# Patient Record
Sex: Female | Born: 1935 | Race: Black or African American | Hispanic: No | Smoking: Former smoker
Health system: Southern US, Community
[De-identification: ages and names within clinical notes are randomized; demographics above are authoritative.]

## PROBLEM LIST (undated history)

## (undated) DIAGNOSIS — C8590 Non-Hodgkin lymphoma, unspecified, unspecified site: Secondary | ICD-10-CM

## (undated) DIAGNOSIS — D696 Thrombocytopenia, unspecified: Secondary | ICD-10-CM

## (undated) DIAGNOSIS — M1990 Unspecified osteoarthritis, unspecified site: Secondary | ICD-10-CM

## (undated) DIAGNOSIS — I639 Cerebral infarction, unspecified: Secondary | ICD-10-CM

## (undated) DIAGNOSIS — C9192 Lymphoid leukemia, unspecified, in relapse: Secondary | ICD-10-CM

## (undated) DIAGNOSIS — F32A Depression, unspecified: Secondary | ICD-10-CM

## (undated) DIAGNOSIS — C50919 Malignant neoplasm of unspecified site of unspecified female breast: Secondary | ICD-10-CM

## (undated) DIAGNOSIS — C9112 Chronic lymphocytic leukemia of B-cell type in relapse: Secondary | ICD-10-CM

## (undated) DIAGNOSIS — I10 Essential (primary) hypertension: Secondary | ICD-10-CM

## (undated) DIAGNOSIS — D539 Nutritional anemia, unspecified: Secondary | ICD-10-CM

## (undated) DIAGNOSIS — K219 Gastro-esophageal reflux disease without esophagitis: Secondary | ICD-10-CM

## (undated) DIAGNOSIS — F329 Major depressive disorder, single episode, unspecified: Secondary | ICD-10-CM

## (undated) DIAGNOSIS — R42 Dizziness and giddiness: Secondary | ICD-10-CM

## (undated) DIAGNOSIS — Z9289 Personal history of other medical treatment: Secondary | ICD-10-CM

## (undated) DIAGNOSIS — F419 Anxiety disorder, unspecified: Secondary | ICD-10-CM

## (undated) DIAGNOSIS — E785 Hyperlipidemia, unspecified: Secondary | ICD-10-CM

## (undated) HISTORY — PX: TOTAL ABDOMINAL HYSTERECTOMY: SHX209

## (undated) HISTORY — DX: Nutritional anemia, unspecified: D53.9

## (undated) HISTORY — PX: MASTECTOMY: SHX3

## (undated) HISTORY — PX: PAROTIDECTOMY: SHX2163

## (undated) HISTORY — DX: Thrombocytopenia, unspecified: D69.6

## (undated) HISTORY — DX: Non-Hodgkin lymphoma, unspecified, unspecified site: C85.90

## (undated) HISTORY — DX: Hyperlipidemia, unspecified: E78.5

## (undated) HISTORY — PX: DILATION AND CURETTAGE OF UTERUS: SHX78

## (undated) HISTORY — DX: Lymphoid leukemia, unspecified, in relapse: C91.92

## (undated) HISTORY — DX: Dizziness and giddiness: R42

## (undated) HISTORY — DX: Unspecified osteoarthritis, unspecified site: M19.90

## (undated) HISTORY — DX: Cerebral infarction, unspecified: I63.9

## (undated) HISTORY — DX: Chronic lymphocytic leukemia of B-cell type in relapse: C91.12

## (undated) HISTORY — DX: Essential (primary) hypertension: I10

## (undated) HISTORY — DX: Malignant neoplasm of unspecified site of unspecified female breast: C50.919

## (undated) HISTORY — PX: HEMORRHOID SURGERY: SHX153

## (undated) HISTORY — PX: PORTACATH PLACEMENT: SHX2246

---

## 1988-09-29 HISTORY — PX: CAROTID ENDARTERECTOMY: SUR193

## 2005-08-17 ENCOUNTER — Emergency Department (HOSPITAL_COMMUNITY): Admission: EM | Admit: 2005-08-17 | Discharge: 2005-08-17 | Payer: Self-pay | Admitting: Emergency Medicine

## 2007-09-30 DIAGNOSIS — C50919 Malignant neoplasm of unspecified site of unspecified female breast: Secondary | ICD-10-CM

## 2007-09-30 HISTORY — PX: AXILLARY LYMPH NODE BIOPSY: SHX5737

## 2007-09-30 HISTORY — PX: BREAST BIOPSY: SHX20

## 2007-09-30 HISTORY — DX: Malignant neoplasm of unspecified site of unspecified female breast: C50.919

## 2008-07-24 ENCOUNTER — Ambulatory Visit (HOSPITAL_COMMUNITY): Admission: RE | Admit: 2008-07-24 | Discharge: 2008-07-24 | Payer: Self-pay | Admitting: General Surgery

## 2008-07-26 ENCOUNTER — Ambulatory Visit: Payer: Self-pay | Admitting: Oncology

## 2008-07-30 HISTORY — PX: BONE MARROW BIOPSY: SHX199

## 2008-08-09 ENCOUNTER — Ambulatory Visit: Payer: Self-pay | Admitting: Oncology

## 2008-08-09 ENCOUNTER — Inpatient Hospital Stay (HOSPITAL_COMMUNITY): Admission: AD | Admit: 2008-08-09 | Discharge: 2008-08-23 | Payer: Self-pay | Admitting: Oncology

## 2008-08-09 LAB — CBC & DIFF AND RETIC
Basophils Absolute: 0 10*3/uL (ref 0.0–0.1)
Eosinophils Absolute: 0.1 10*3/uL (ref 0.0–0.5)
HCT: 24.7 % — ABNORMAL LOW (ref 34.8–46.6)
HGB: 9 g/dL — ABNORMAL LOW (ref 11.6–15.9)
IRF: 0.38 — ABNORMAL HIGH (ref 0.130–0.330)
LYMPH%: 55.2 % — ABNORMAL HIGH (ref 14.0–48.0)
MCHC: 36.2 g/dL — ABNORMAL HIGH (ref 32.0–36.0)
MONO#: 0.2 10*3/uL (ref 0.1–0.9)
NEUT%: 39.1 % — ABNORMAL LOW (ref 39.6–76.8)
Platelets: 7 10*3/uL — CL (ref 145–400)
WBC: 4.4 10*3/uL (ref 3.9–10.0)
lymph#: 2.4 10*3/uL (ref 0.9–3.3)

## 2008-08-09 LAB — MORPHOLOGY: PLT EST: DECREASED

## 2008-08-10 ENCOUNTER — Encounter: Payer: Self-pay | Admitting: Oncology

## 2008-08-11 LAB — IMMUNOFIXATION ELECTROPHORESIS
IgA: 161 mg/dL (ref 68–378)
IgM, Serum: 188 mg/dL (ref 60–263)

## 2008-08-11 LAB — URIC ACID: Uric Acid, Serum: 6.5 mg/dL (ref 2.4–7.0)

## 2008-08-12 ENCOUNTER — Ambulatory Visit: Payer: Self-pay | Admitting: Hematology & Oncology

## 2008-08-28 ENCOUNTER — Encounter (HOSPITAL_COMMUNITY): Admission: RE | Admit: 2008-08-28 | Discharge: 2008-09-28 | Payer: Self-pay | Admitting: Oncology

## 2008-08-28 LAB — CBC WITH DIFFERENTIAL/PLATELET
BASO%: 1.5 % (ref 0.0–2.0)
LYMPH%: 60.2 % — ABNORMAL HIGH (ref 14.0–48.0)
MCHC: 36.2 g/dL — ABNORMAL HIGH (ref 32.0–36.0)
MONO#: 0.1 10*3/uL (ref 0.1–0.9)
Platelets: <6 10*3/uL — CL (ref 145–400)
RBC: 2.67 10*6/uL — ABNORMAL LOW (ref 3.70–5.32)
RDW: 14 % (ref 11.3–14.5)
WBC: 1.4 10*3/uL — ABNORMAL LOW (ref 3.9–10.0)
lymph#: 0.8 10*3/uL — ABNORMAL LOW (ref 0.9–3.3)

## 2008-08-30 LAB — CBC WITH DIFFERENTIAL/PLATELET
BASO%: 2 % (ref 0.0–2.0)
Basophils Absolute: 0 10*3/uL (ref 0.0–0.1)
Eosinophils Absolute: 0 10*3/uL (ref 0.0–0.5)
HCT: 21.6 % — ABNORMAL LOW (ref 34.8–46.6)
HGB: 7.6 g/dL — ABNORMAL LOW (ref 11.6–15.9)
LYMPH%: 43.6 % (ref 14.0–48.0)
MONO#: 0 10*3/uL — ABNORMAL LOW (ref 0.1–0.9)
NEUT%: 47.9 % (ref 39.6–76.8)
Platelets: 21 10*3/uL — ABNORMAL LOW (ref 145–400)
WBC: 0.7 10*3/uL — CL (ref 3.9–10.0)
lymph#: 0.3 10*3/uL — ABNORMAL LOW (ref 0.9–3.3)

## 2008-09-01 LAB — CBC WITH DIFFERENTIAL/PLATELET
Basophils Absolute: 0 10*3/uL (ref 0.0–0.1)
Eosinophils Absolute: 0 10*3/uL (ref 0.0–0.5)
HCT: 26.3 % — ABNORMAL LOW (ref 34.8–46.6)
HGB: 9.5 g/dL — ABNORMAL LOW (ref 11.6–15.9)
MCH: 32.2 pg (ref 26.0–34.0)
MCV: 89.5 fL (ref 81.0–101.0)
MONO%: 1.5 % (ref 0.0–13.0)
NEUT#: 0.2 10*3/uL — CL (ref 1.5–6.5)
NEUT%: 30.4 % — ABNORMAL LOW (ref 39.6–76.8)
Platelets: 9 10*3/uL — CL (ref 145–400)
RDW: 16.4 % — ABNORMAL HIGH (ref 11.3–14.5)

## 2008-09-04 LAB — CBC WITH DIFFERENTIAL/PLATELET
HGB: 8.9 g/dL — ABNORMAL LOW (ref 11.6–15.9)
MCH: 30.3 pg (ref 26.0–34.0)
MCHC: 36 g/dL (ref 32.0–36.0)
MCV: 84 fL (ref 81.0–101.0)
RDW: 13.5 % (ref 11.3–14.5)

## 2008-09-04 LAB — MANUAL DIFFERENTIAL
Band Neutrophils: 5 % (ref 0–10)
Basophil: 0 % (ref 0–2)
EOS: 0 % (ref 0–7)
LYMPH: 85 % — ABNORMAL HIGH (ref 14–49)
Metamyelocytes: 0 % (ref 0–0)
PLT EST: DECREASED
PROMYELO: 0 % (ref 0–0)
nRBC: 0 % (ref 0–0)

## 2008-09-06 LAB — CBC WITH DIFFERENTIAL/PLATELET
Basophils Absolute: 0 10*3/uL (ref 0.0–0.1)
Eosinophils Absolute: 0 10*3/uL (ref 0.0–0.5)
HCT: 23.2 % — ABNORMAL LOW (ref 34.8–46.6)
HGB: 8.4 g/dL — ABNORMAL LOW (ref 11.6–15.9)
LYMPH%: 66.1 % — ABNORMAL HIGH (ref 14.0–48.0)
MCV: 84.7 fL (ref 81.0–101.0)
MONO#: 0.1 10*3/uL (ref 0.1–0.9)
MONO%: 6.6 % (ref 0.0–13.0)
NEUT#: 0.2 10*3/uL — CL (ref 1.5–6.5)
NEUT%: 22.4 % — ABNORMAL LOW (ref 39.6–76.8)
Platelets: 11 10*3/uL — ABNORMAL LOW (ref 145–400)
WBC: 1.1 10*3/uL — ABNORMAL LOW (ref 3.9–10.0)

## 2008-09-08 LAB — CBC WITH DIFFERENTIAL/PLATELET
BASO%: 0.6 % (ref 0.0–2.0)
HCT: 19.3 % — ABNORMAL LOW (ref 34.8–46.6)
LYMPH%: 93.5 % — ABNORMAL HIGH (ref 14.0–48.0)
MCH: 31.8 pg (ref 26.0–34.0)
MCHC: 36.5 g/dL — ABNORMAL HIGH (ref 32.0–36.0)
MCV: 87.1 fL (ref 81.0–101.0)
MONO#: 0 10*3/uL — ABNORMAL LOW (ref 0.1–0.9)
MONO%: 0.9 % (ref 0.0–13.0)
NEUT%: 4.3 % — ABNORMAL LOW (ref 39.6–76.8)
Platelets: 9 10*3/uL — CL (ref 145–400)
RBC: 2.22 10*6/uL — ABNORMAL LOW (ref 3.70–5.32)
WBC: 1.8 10*3/uL — ABNORMAL LOW (ref 3.9–10.0)

## 2008-09-08 LAB — COMPREHENSIVE METABOLIC PANEL
ALT: 20 U/L (ref 0–35)
CO2: 24 mEq/L (ref 19–32)
Calcium: 9.2 mg/dL (ref 8.4–10.5)
Chloride: 107 mEq/L (ref 96–112)
Creatinine, Ser: 1.14 mg/dL (ref 0.40–1.20)
Glucose, Bld: 109 mg/dL — ABNORMAL HIGH (ref 70–99)
Total Protein: 6 g/dL (ref 6.0–8.3)

## 2008-09-08 LAB — TYPE & CROSSMATCH - CHCC

## 2008-09-11 ENCOUNTER — Ambulatory Visit: Payer: Self-pay | Admitting: Oncology

## 2008-09-11 LAB — CBC WITH DIFFERENTIAL/PLATELET
Basophils Absolute: 0 10*3/uL (ref 0.0–0.1)
EOS%: 3.6 % (ref 0.0–7.0)
HGB: 7.2 g/dL — ABNORMAL LOW (ref 11.6–15.9)
LYMPH%: 91.4 % — ABNORMAL HIGH (ref 14.0–48.0)
MCH: 29.9 pg (ref 26.0–34.0)
MCV: 84.3 fL (ref 81.0–101.0)
MONO%: 0.8 % (ref 0.0–13.0)
NEUT%: 3.2 % — ABNORMAL LOW (ref 39.6–76.8)
RDW: 12.9 % (ref 11.3–14.5)

## 2008-09-13 LAB — CBC WITH DIFFERENTIAL/PLATELET
BASO%: 1.1 % (ref 0.0–2.0)
LYMPH%: 89 % — ABNORMAL HIGH (ref 14.0–48.0)
MCH: 28.6 pg (ref 26.0–34.0)
MCHC: 35.3 g/dL (ref 32.0–36.0)
MCV: 81 fL (ref 81.0–101.0)
MONO%: 2 % (ref 0.0–13.0)
Platelets: 8 10*3/uL — CL (ref 145–400)
RBC: 3.11 10*6/uL — ABNORMAL LOW (ref 3.70–5.32)

## 2008-09-18 LAB — CBC WITH DIFFERENTIAL/PLATELET
BASO%: 1.2 % (ref 0.0–2.0)
EOS%: 0.4 % (ref 0.0–7.0)
LYMPH%: 92.8 % — ABNORMAL HIGH (ref 14.0–48.0)
MCH: 29.5 pg (ref 26.0–34.0)
MCHC: 37.1 g/dL — ABNORMAL HIGH (ref 32.0–36.0)
MONO#: 0.1 10*3/uL (ref 0.1–0.9)
RBC: 2.78 10*6/uL — ABNORMAL LOW (ref 3.70–5.32)
WBC: 2 10*3/uL — ABNORMAL LOW (ref 3.9–10.0)
lymph#: 1.8 10*3/uL (ref 0.9–3.3)

## 2008-09-19 LAB — TYPE & CROSSMATCH - CHCC

## 2008-09-25 LAB — CBC WITH DIFFERENTIAL/PLATELET
Basophils Absolute: 0 10*3/uL (ref 0.0–0.1)
Eosinophils Absolute: 0 10*3/uL (ref 0.0–0.5)
HGB: 8.2 g/dL — ABNORMAL LOW (ref 11.6–15.9)
NEUT#: 0.1 10*3/uL — CL (ref 1.5–6.5)
RBC: 2.73 10*6/uL — ABNORMAL LOW (ref 3.70–5.32)
RDW: 15.3 % — ABNORMAL HIGH (ref 11.3–14.5)
WBC: 2.3 10*3/uL — ABNORMAL LOW (ref 3.9–10.0)
lymph#: 2.2 10*3/uL (ref 0.9–3.3)

## 2008-09-27 LAB — CBC WITH DIFFERENTIAL/PLATELET
Basophils Absolute: 0 10*3/uL (ref 0.0–0.1)
Eosinophils Absolute: 0 10*3/uL (ref 0.0–0.5)
HGB: 7.4 g/dL — ABNORMAL LOW (ref 11.6–15.9)
LYMPH%: 89.1 % — ABNORMAL HIGH (ref 14.0–48.0)
MCV: 81.2 fL (ref 81.0–101.0)
MONO#: 0 10*3/uL — ABNORMAL LOW (ref 0.1–0.9)
MONO%: 1 % (ref 0.0–13.0)
NEUT#: 0.2 10*3/uL — CL (ref 1.5–6.5)
Platelets: 13 10*3/uL — ABNORMAL LOW (ref 145–400)
RBC: 2.56 10*6/uL — ABNORMAL LOW (ref 3.70–5.32)
RDW: 13.2 % (ref 11.3–14.5)
WBC: 1.9 10*3/uL — ABNORMAL LOW (ref 3.9–10.0)

## 2008-09-28 LAB — TYPE & CROSSMATCH - CHCC

## 2008-10-02 LAB — CBC WITH DIFFERENTIAL/PLATELET
Eosinophils Absolute: 0 10*3/uL (ref 0.0–0.5)
MCV: 84.5 fL (ref 81.0–101.0)
MONO#: 0 10*3/uL — ABNORMAL LOW (ref 0.1–0.9)
MONO%: 0.8 % (ref 0.0–13.0)
NEUT#: 0.3 10*3/uL — CL (ref 1.5–6.5)
RBC: 2.89 10*6/uL — ABNORMAL LOW (ref 3.70–5.32)
RDW: 14.4 % (ref 11.3–14.5)
WBC: 2.4 10*3/uL — ABNORMAL LOW (ref 3.9–10.0)

## 2008-10-04 ENCOUNTER — Encounter (HOSPITAL_COMMUNITY): Admission: RE | Admit: 2008-10-04 | Discharge: 2009-01-02 | Payer: Self-pay | Admitting: Oncology

## 2008-10-04 LAB — CBC WITH DIFFERENTIAL/PLATELET
Eosinophils Absolute: 0 10*3/uL (ref 0.0–0.5)
LYMPH%: 85.4 % — ABNORMAL HIGH (ref 14.0–48.0)
MONO#: 0 10*3/uL — ABNORMAL LOW (ref 0.1–0.9)
NEUT#: 0.3 10*3/uL — CL (ref 1.5–6.5)
Platelets: 7 10*3/uL — CL (ref 145–400)
RBC: 2.66 10*6/uL — ABNORMAL LOW (ref 3.70–5.32)
RDW: 14.2 % (ref 11.3–14.5)
WBC: 2.2 10*3/uL — ABNORMAL LOW (ref 3.9–10.0)
lymph#: 1.8 10*3/uL (ref 0.9–3.3)

## 2008-10-05 LAB — TYPE & CROSSMATCH - CHCC

## 2008-10-09 LAB — CBC WITH DIFFERENTIAL/PLATELET
Basophils Absolute: 0 10*3/uL (ref 0.0–0.1)
Eosinophils Absolute: 0 10*3/uL (ref 0.0–0.5)
HCT: 14.2 % — ABNORMAL LOW (ref 34.8–46.6)
HGB: 5.1 g/dL — CL (ref 11.6–15.9)
MONO#: 0 10*3/uL — ABNORMAL LOW (ref 0.1–0.9)
NEUT#: 0.8 10*3/uL — ABNORMAL LOW (ref 1.5–6.5)
NEUT%: 24.2 % — ABNORMAL LOW (ref 39.6–76.8)
WBC: 3.1 10*3/uL — ABNORMAL LOW (ref 3.9–10.0)
lymph#: 2.3 10*3/uL (ref 0.9–3.3)

## 2008-10-11 LAB — CBC WITH DIFFERENTIAL/PLATELET
EOS%: 0.1 % (ref 0.0–7.0)
Eosinophils Absolute: 0 10*3/uL (ref 0.0–0.5)
MCV: 86.1 fL (ref 81.0–101.0)
MONO%: 1.1 % (ref 0.0–13.0)
NEUT#: 0.6 10*3/uL — ABNORMAL LOW (ref 1.5–6.5)
RBC: 2 10*6/uL — ABNORMAL LOW (ref 3.70–5.32)
RDW: 14 % (ref 11.3–14.5)
WBC: 1.1 10*3/uL — ABNORMAL LOW (ref 3.9–10.0)

## 2008-10-11 LAB — HOLD TUBE, BLOOD BANK

## 2008-10-12 LAB — TYPE & CROSSMATCH - CHCC

## 2008-10-13 LAB — CBC WITH DIFFERENTIAL/PLATELET
BASO%: 0 % (ref 0.0–2.0)
EOS%: 1 % (ref 0.0–7.0)
HGB: 8.6 g/dL — ABNORMAL LOW (ref 11.6–15.9)
MCH: 31.2 pg (ref 26.0–34.0)
MCHC: 36.1 g/dL — ABNORMAL HIGH (ref 32.0–36.0)
RDW: 13.6 % (ref 11.3–14.5)
lymph#: 0.1 10*3/uL — ABNORMAL LOW (ref 0.9–3.3)

## 2008-10-13 LAB — HOLD TUBE, BLOOD BANK

## 2008-10-16 ENCOUNTER — Ambulatory Visit: Payer: Self-pay | Admitting: Oncology

## 2008-10-20 LAB — CBC WITH DIFFERENTIAL/PLATELET
BASO%: 0 % (ref 0.0–2.0)
LYMPH%: 1.9 % — ABNORMAL LOW (ref 14.0–48.0)
MCHC: 36.4 g/dL — ABNORMAL HIGH (ref 32.0–36.0)
MONO#: 0 10*3/uL — ABNORMAL LOW (ref 0.1–0.9)
Platelets: 10 10*3/uL — ABNORMAL LOW (ref 145–400)
RBC: 2.86 10*6/uL — ABNORMAL LOW (ref 3.70–5.32)
WBC: 0.9 10*3/uL — CL (ref 3.9–10.0)

## 2008-10-20 LAB — HOLD TUBE, BLOOD BANK

## 2008-10-23 LAB — CBC & DIFF AND RETIC
Basophils Absolute: 0 10*3/uL (ref 0.0–0.1)
Eosinophils Absolute: 0 10*3/uL (ref 0.0–0.5)
HGB: 8.2 g/dL — ABNORMAL LOW (ref 11.6–15.9)
LYMPH%: 1.4 % — ABNORMAL LOW (ref 14.0–48.0)
MCV: 87.8 fL (ref 81.0–101.0)
MONO%: 3.7 % (ref 0.0–13.0)
NEUT#: 0.8 10*3/uL — ABNORMAL LOW (ref 1.5–6.5)
Platelets: 23 10*3/uL — ABNORMAL LOW (ref 145–400)
RBC: 2.55 10*6/uL — ABNORMAL LOW (ref 3.70–5.32)

## 2008-10-23 LAB — MORPHOLOGY

## 2008-10-23 LAB — HOLD TUBE, BLOOD BANK

## 2008-10-25 LAB — CBC WITH DIFFERENTIAL/PLATELET
Basophils Absolute: 0 10*3/uL (ref 0.0–0.1)
Eosinophils Absolute: 0 10*3/uL (ref 0.0–0.5)
HGB: 7.3 g/dL — ABNORMAL LOW (ref 11.6–15.9)
NEUT#: 0.9 10*3/uL — ABNORMAL LOW (ref 1.5–6.5)
RDW: 13.4 % (ref 11.3–14.5)
lymph#: 0 10*3/uL — ABNORMAL LOW (ref 0.9–3.3)

## 2008-10-26 LAB — COMPREHENSIVE METABOLIC PANEL
Albumin: 3.6 g/dL (ref 3.5–5.2)
BUN: 22 mg/dL (ref 6–23)
Calcium: 8.8 mg/dL (ref 8.4–10.5)
Chloride: 104 mEq/L (ref 96–112)
Glucose, Bld: 120 mg/dL — ABNORMAL HIGH (ref 70–99)
Potassium: 3.4 mEq/L — ABNORMAL LOW (ref 3.5–5.3)

## 2008-10-26 LAB — CYTOMEGALOVIRUS PCR, QUALITATIVE: Cytomegalovirus DNA: NOT DETECTED

## 2008-10-27 LAB — CBC WITH DIFFERENTIAL/PLATELET
BASO%: 0 % (ref 0.0–2.0)
Eosinophils Absolute: 0 10*3/uL (ref 0.0–0.5)
HCT: 25.2 % — ABNORMAL LOW (ref 34.8–46.6)
LYMPH%: 1.2 % — ABNORMAL LOW (ref 14.0–48.0)
MCHC: 36.4 g/dL — ABNORMAL HIGH (ref 32.0–36.0)
MONO#: 0 10*3/uL — ABNORMAL LOW (ref 0.1–0.9)
NEUT#: 0.8 10*3/uL — ABNORMAL LOW (ref 1.5–6.5)
Platelets: 7 10*3/uL — CL (ref 145–400)
RBC: 2.93 10*6/uL — ABNORMAL LOW (ref 3.70–5.32)
WBC: 0.9 10*3/uL — CL (ref 3.9–10.0)
lymph#: 0 10*3/uL — ABNORMAL LOW (ref 0.9–3.3)

## 2008-10-30 LAB — CBC WITH DIFFERENTIAL/PLATELET
BASO%: 0 % (ref 0.0–2.0)
EOS%: 0 % (ref 0.0–7.0)
MCH: 32 pg (ref 26.0–34.0)
MCHC: 36.8 g/dL — ABNORMAL HIGH (ref 32.0–36.0)
MONO#: 0 10*3/uL — ABNORMAL LOW (ref 0.1–0.9)
RBC: 2.72 10*6/uL — ABNORMAL LOW (ref 3.70–5.32)
RDW: 13.4 % (ref 11.3–14.5)
WBC: 1.2 10*3/uL — ABNORMAL LOW (ref 3.9–10.0)
lymph#: 0 10*3/uL — ABNORMAL LOW (ref 0.9–3.3)

## 2008-11-01 LAB — CBC WITH DIFFERENTIAL/PLATELET
BASO%: 0 % (ref 0.0–2.0)
LYMPH%: 1 % — ABNORMAL LOW (ref 14.0–48.0)
MCHC: UNDETERMINED g/dL (ref 32.0–36.0)
MONO#: 0 10*3/uL — ABNORMAL LOW (ref 0.1–0.9)
Platelets: 12 10*3/uL — ABNORMAL LOW (ref 145–400)
RBC: UNDETERMINED 10*6/uL (ref 3.70–5.32)
RDW: UNDETERMINED % (ref 11.3–14.5)
WBC: 1.1 10*3/uL — ABNORMAL LOW (ref 3.9–10.0)
lymph#: 0 10*3/uL — ABNORMAL LOW (ref 0.9–3.3)

## 2008-11-02 LAB — CYTOMEGALOVIRUS PCR, QUALITATIVE: Cytomegalovirus DNA: DETECTED — AB

## 2008-11-03 LAB — CBC WITH DIFFERENTIAL/PLATELET
BASO%: 0 % (ref 0.0–2.0)
HCT: 19.7 % — ABNORMAL LOW (ref 34.8–46.6)
MCHC: 36.6 g/dL — ABNORMAL HIGH (ref 32.0–36.0)
MONO#: 0 10*3/uL — ABNORMAL LOW (ref 0.1–0.9)
NEUT#: 0.7 10*3/uL — ABNORMAL LOW (ref 1.5–6.5)
NEUT%: 94.1 % — ABNORMAL HIGH (ref 39.6–76.8)
WBC: 0.8 10*3/uL — CL (ref 3.9–10.0)
lymph#: 0 10*3/uL — ABNORMAL LOW (ref 0.9–3.3)

## 2008-11-03 LAB — HOLD TUBE, BLOOD BANK

## 2008-11-06 LAB — CBC WITH DIFFERENTIAL/PLATELET
Basophils Absolute: 0 10*3/uL (ref 0.0–0.1)
Eosinophils Absolute: 0 10*3/uL (ref 0.0–0.5)
HGB: 9.4 g/dL — ABNORMAL LOW (ref 11.6–15.9)
MCV: 83.5 fL (ref 81.0–101.0)
MONO%: 4.9 % (ref 0.0–13.0)
NEUT#: 1 10*3/uL — ABNORMAL LOW (ref 1.5–6.5)
Platelets: 10 10*3/uL — ABNORMAL LOW (ref 145–400)
RDW: 13.9 % (ref 11.3–14.5)

## 2008-11-09 LAB — CYTOMEGALOVIRUS PCR, QUALITATIVE: Cytomegalovirus DNA: DETECTED — AB

## 2008-11-10 LAB — CBC WITH DIFFERENTIAL/PLATELET
Basophils Absolute: 0 10*3/uL (ref 0.0–0.1)
EOS%: 0.2 % (ref 0.0–7.0)
Eosinophils Absolute: 0 10*3/uL (ref 0.0–0.5)
HCT: 22 % — ABNORMAL LOW (ref 34.8–46.6)
HGB: 8.1 g/dL — ABNORMAL LOW (ref 11.6–15.9)
MCH: 30.3 pg (ref 26.0–34.0)
MONO#: 0 10*3/uL — ABNORMAL LOW (ref 0.1–0.9)
NEUT%: 96.4 % — ABNORMAL HIGH (ref 39.6–76.8)
lymph#: 0 10*3/uL — ABNORMAL LOW (ref 0.9–3.3)

## 2008-11-13 ENCOUNTER — Emergency Department (HOSPITAL_COMMUNITY): Admission: EM | Admit: 2008-11-13 | Discharge: 2008-11-13 | Payer: Self-pay | Admitting: *Deleted

## 2008-11-13 LAB — CBC WITH DIFFERENTIAL/PLATELET
Basophils Absolute: 0 10*3/uL (ref 0.0–0.1)
EOS%: 0.2 % (ref 0.0–7.0)
HCT: UNDETERMINED % (ref 34.8–46.6)
HGB: 7.2 g/dL — ABNORMAL LOW (ref 11.6–15.9)
LYMPH%: 3.7 % — ABNORMAL LOW (ref 14.0–48.0)
MCH: UNDETERMINED pg (ref 26.0–34.0)
MCV: UNDETERMINED fL (ref 81.0–101.0)
MONO%: 2.3 % (ref 0.0–13.0)
NEUT%: 93.8 % — ABNORMAL HIGH (ref 39.6–76.8)
Platelets: 8 10*3/uL — CL (ref 145–400)

## 2008-11-15 ENCOUNTER — Ambulatory Visit: Payer: Self-pay | Admitting: Oncology

## 2008-11-15 ENCOUNTER — Inpatient Hospital Stay (HOSPITAL_COMMUNITY): Admission: AD | Admit: 2008-11-15 | Discharge: 2008-11-18 | Payer: Self-pay | Admitting: Oncology

## 2008-11-15 LAB — CBC WITH DIFFERENTIAL/PLATELET
Basophils Absolute: 0 10*3/uL (ref 0.0–0.1)
EOS%: 0.7 % (ref 0.0–7.0)
HCT: 25.6 % — ABNORMAL LOW (ref 34.8–46.6)
HGB: 9.3 g/dL — ABNORMAL LOW (ref 11.6–15.9)
LYMPH%: 5.8 % — ABNORMAL LOW (ref 14.0–48.0)
MCH: 30.5 pg (ref 26.0–34.0)
MCV: 83.9 fL (ref 81.0–101.0)
MONO%: 6.2 % (ref 0.0–13.0)
NEUT%: 87.3 % — ABNORMAL HIGH (ref 39.6–76.8)
Platelets: 26 10*3/uL — ABNORMAL LOW (ref 145–400)
lymph#: 0 10*3/uL — ABNORMAL LOW (ref 0.9–3.3)

## 2008-11-20 ENCOUNTER — Ambulatory Visit (HOSPITAL_COMMUNITY): Admission: RE | Admit: 2008-11-20 | Discharge: 2008-11-20 | Payer: Self-pay | Admitting: Oncology

## 2008-11-20 LAB — CBC WITH DIFFERENTIAL/PLATELET
Eosinophils Absolute: 0 10*3/uL (ref 0.0–0.5)
LYMPH%: 41.1 % (ref 14.0–49.7)
MCH: 30.7 pg (ref 25.1–34.0)
MCV: 83.7 fL (ref 79.5–101.0)
MONO%: 5.6 % (ref 0.0–14.0)
NEUT#: 0.3 10*3/uL — CL (ref 1.5–6.5)
Platelets: 24 10*3/uL — ABNORMAL LOW (ref 145–400)
RBC: 3.35 10*6/uL — ABNORMAL LOW (ref 3.70–5.45)

## 2008-11-22 LAB — CBC WITH DIFFERENTIAL/PLATELET
BASO%: 0.4 % (ref 0.0–2.0)
EOS%: 3 % (ref 0.0–7.0)
HCT: 26.5 % — ABNORMAL LOW (ref 34.8–46.6)
MCH: 30.3 pg (ref 25.1–34.0)
MCHC: 36.2 g/dL — ABNORMAL HIGH (ref 31.5–36.0)
MONO#: 0 10*3/uL — ABNORMAL LOW (ref 0.1–0.9)
RBC: 3.17 10*6/uL — ABNORMAL LOW (ref 3.70–5.45)
RDW: 14.2 % (ref 11.2–14.5)
WBC: 0.7 10*3/uL — CL (ref 3.9–10.3)
lymph#: 0.3 10*3/uL — ABNORMAL LOW (ref 0.9–3.3)

## 2008-11-23 LAB — COMPREHENSIVE METABOLIC PANEL
Albumin: 3.8 g/dL (ref 3.5–5.2)
Alkaline Phosphatase: 104 U/L (ref 39–117)
BUN: 17 mg/dL (ref 6–23)
CO2: 23 mEq/L (ref 19–32)
Glucose, Bld: 111 mg/dL — ABNORMAL HIGH (ref 70–99)
Potassium: 3.7 mEq/L (ref 3.5–5.3)
Sodium: 135 mEq/L (ref 135–145)
Total Protein: 6.2 g/dL (ref 6.0–8.3)

## 2008-11-23 LAB — CYTOMEGALOVIRUS PCR, QUALITATIVE

## 2008-11-23 LAB — URIC ACID: Uric Acid, Serum: 4.9 mg/dL (ref 2.4–7.0)

## 2008-11-23 LAB — LACTATE DEHYDROGENASE: LDH: 326 U/L — ABNORMAL HIGH (ref 94–250)

## 2008-11-24 LAB — CBC WITH DIFFERENTIAL/PLATELET
Basophils Absolute: 0 10*3/uL (ref 0.0–0.1)
EOS%: 0.5 % (ref 0.0–7.0)
Eosinophils Absolute: 0 10*3/uL (ref 0.0–0.5)
HCT: 23.2 % — ABNORMAL LOW (ref 34.8–46.6)
HGB: 8.3 g/dL — ABNORMAL LOW (ref 11.6–15.9)
MCH: 30.1 pg (ref 25.1–34.0)
MONO#: 0 10*3/uL — ABNORMAL LOW (ref 0.1–0.9)
NEUT#: 0.3 10*3/uL — CL (ref 1.5–6.5)
NEUT%: 38.2 % — ABNORMAL LOW (ref 38.4–76.8)
RDW: 13.8 % (ref 11.2–14.5)
WBC: 0.7 10*3/uL — CL (ref 3.9–10.3)
lymph#: 0.4 10*3/uL — ABNORMAL LOW (ref 0.9–3.3)

## 2008-11-27 LAB — CBC WITH DIFFERENTIAL/PLATELET
Eosinophils Absolute: 0 10*3/uL (ref 0.0–0.5)
HCT: 27.9 % — ABNORMAL LOW (ref 34.8–46.6)
LYMPH%: 64.2 % — ABNORMAL HIGH (ref 14.0–49.7)
MONO#: 0 10*3/uL — ABNORMAL LOW (ref 0.1–0.9)
NEUT#: 0.2 10*3/uL — CL (ref 1.5–6.5)
NEUT%: 32.8 % — ABNORMAL LOW (ref 38.4–76.8)
Platelets: 21 10*3/uL — ABNORMAL LOW (ref 145–400)
WBC: 0.7 10*3/uL — CL (ref 3.9–10.3)
nRBC: 0 % (ref 0–0)

## 2008-11-29 LAB — CBC WITH DIFFERENTIAL/PLATELET
Basophils Absolute: 0 10*3/uL (ref 0.0–0.1)
EOS%: 0.3 % (ref 0.0–7.0)
Eosinophils Absolute: 0 10*3/uL (ref 0.0–0.5)
HCT: 26.6 % — ABNORMAL LOW (ref 34.8–46.6)
HGB: 9.7 g/dL — ABNORMAL LOW (ref 11.6–15.9)
MCH: 30.3 pg (ref 25.1–34.0)
MCV: 83.6 fL (ref 79.5–101.0)
NEUT#: 0.2 10*3/uL — CL (ref 1.5–6.5)
NEUT%: 32.1 % — ABNORMAL LOW (ref 38.4–76.8)
lymph#: 0.4 10*3/uL — ABNORMAL LOW (ref 0.9–3.3)

## 2008-12-01 ENCOUNTER — Ambulatory Visit: Payer: Self-pay | Admitting: Oncology

## 2008-12-01 LAB — CBC WITH DIFFERENTIAL/PLATELET
Basophils Absolute: 0 10*3/uL (ref 0.0–0.1)
Eosinophils Absolute: 0 10*3/uL (ref 0.0–0.5)
HGB: 8.7 g/dL — ABNORMAL LOW (ref 11.6–15.9)
MONO#: 0 10*3/uL — ABNORMAL LOW (ref 0.1–0.9)
NEUT#: 0.2 10*3/uL — CL (ref 1.5–6.5)
RBC: 3.08 10*6/uL — ABNORMAL LOW (ref 3.70–5.45)
RDW: 13.7 % (ref 11.2–14.5)
WBC: 1 10*3/uL — ABNORMAL LOW (ref 3.9–10.3)
nRBC: 0 % (ref 0–0)

## 2008-12-04 LAB — CBC WITH DIFFERENTIAL/PLATELET
BASO%: 1.7 % (ref 0.0–2.0)
Basophils Absolute: 0 10*3/uL (ref 0.0–0.1)
Eosinophils Absolute: 0 10*3/uL (ref 0.0–0.5)
HCT: 24.1 % — ABNORMAL LOW (ref 34.8–46.6)
HGB: 8.4 g/dL — ABNORMAL LOW (ref 11.6–15.9)
LYMPH%: 74.6 % — ABNORMAL HIGH (ref 14.0–49.7)
MONO#: 0 10*3/uL — ABNORMAL LOW (ref 0.1–0.9)
NEUT#: 0.3 10*3/uL — CL (ref 1.5–6.5)
NEUT%: 21.2 % — ABNORMAL LOW (ref 38.4–76.8)
Platelets: <6 10*3/uL — CL (ref 145–400)
WBC: 1.2 10*3/uL — ABNORMAL LOW (ref 3.9–10.3)
lymph#: 0.9 10*3/uL (ref 0.9–3.3)

## 2008-12-06 LAB — CBC WITH DIFFERENTIAL/PLATELET
Basophils Absolute: 0 10*3/uL (ref 0.0–0.1)
Eosinophils Absolute: 0 10*3/uL (ref 0.0–0.5)
HCT: 21.2 % — ABNORMAL LOW (ref 34.8–46.6)
HGB: 7.6 g/dL — ABNORMAL LOW (ref 11.6–15.9)
MCV: 83.1 fL (ref 79.5–101.0)
MONO%: 2.5 % (ref 0.0–14.0)
NEUT#: 0.2 10*3/uL — CL (ref 1.5–6.5)
NEUT%: 23.2 % — ABNORMAL LOW (ref 38.4–76.8)
Platelets: 21 10*3/uL — ABNORMAL LOW (ref 145–400)
RDW: 13.6 % (ref 11.2–14.5)

## 2008-12-08 LAB — CBC WITH DIFFERENTIAL/PLATELET
Eosinophils Absolute: 0 10*3/uL (ref 0.0–0.5)
HCT: 26.5 % — ABNORMAL LOW (ref 34.8–46.6)
LYMPH%: 69.5 % — ABNORMAL HIGH (ref 14.0–49.7)
MCHC: 35.6 g/dL (ref 31.5–36.0)
MCV: 82.9 fL (ref 79.5–101.0)
MONO#: 0 10*3/uL — ABNORMAL LOW (ref 0.1–0.9)
NEUT%: 25 % — ABNORMAL LOW (ref 38.4–76.8)
Platelets: 12 10*3/uL — ABNORMAL LOW (ref 145–400)
WBC: 0.8 10*3/uL — CL (ref 3.9–10.3)

## 2008-12-11 LAB — URIC ACID: Uric Acid, Serum: 6.6 mg/dL (ref 2.4–7.0)

## 2008-12-11 LAB — CBC WITH DIFFERENTIAL/PLATELET
BASO%: 0.7 % (ref 0.0–2.0)
EOS%: 1 % (ref 0.0–7.0)
HCT: 26.1 % — ABNORMAL LOW (ref 34.8–46.6)
LYMPH%: 64.2 % — ABNORMAL HIGH (ref 14.0–49.7)
MCH: 29.6 pg (ref 25.1–34.0)
MCHC: 35.4 g/dL (ref 31.5–36.0)
MONO%: 2.8 % (ref 0.0–14.0)
NEUT%: 31.3 % — ABNORMAL LOW (ref 38.4–76.8)
Platelets: 9 10*3/uL — CL (ref 145–400)

## 2008-12-11 LAB — COMPREHENSIVE METABOLIC PANEL
ALT: 151 U/L — ABNORMAL HIGH (ref 0–35)
Albumin: 4 g/dL (ref 3.5–5.2)
CO2: 24 mEq/L (ref 19–32)
Glucose, Bld: 108 mg/dL — ABNORMAL HIGH (ref 70–99)
Potassium: 3.7 mEq/L (ref 3.5–5.3)
Sodium: 141 mEq/L (ref 135–145)
Total Protein: 6.4 g/dL (ref 6.0–8.3)

## 2008-12-11 LAB — LACTATE DEHYDROGENASE: LDH: 396 U/L — ABNORMAL HIGH (ref 94–250)

## 2008-12-11 LAB — TECHNOLOGIST REVIEW

## 2008-12-13 LAB — CBC WITH DIFFERENTIAL/PLATELET
BASO%: 0.7 % (ref 0.0–2.0)
Basophils Absolute: 0 10*3/uL (ref 0.0–0.1)
EOS%: 1.4 % (ref 0.0–7.0)
HCT: 23.9 % — ABNORMAL LOW (ref 34.8–46.6)
HGB: 8.7 g/dL — ABNORMAL LOW (ref 11.6–15.9)
MCH: 29.8 pg (ref 25.1–34.0)
MCHC: 36.3 g/dL — ABNORMAL HIGH (ref 31.5–36.0)
MCV: 82.3 fL (ref 79.5–101.0)
MONO%: 2.2 % (ref 0.0–14.0)
NEUT%: 26.9 % — ABNORMAL LOW (ref 38.4–76.8)
RDW: 12.9 % (ref 11.2–14.5)
lymph#: 0.6 10*3/uL — ABNORMAL LOW (ref 0.9–3.3)

## 2008-12-15 LAB — CBC WITH DIFFERENTIAL/PLATELET
BASO%: 0 % (ref 0.0–2.0)
EOS%: 0 % (ref 0.0–7.0)
LYMPH%: 67.2 % — ABNORMAL HIGH (ref 14.0–49.7)
MCH: 27.6 pg (ref 25.1–34.0)
MCHC: 34.5 g/dL (ref 31.5–36.0)
MCV: 80 fL (ref 79.5–101.0)
MONO%: 1.7 % (ref 0.0–14.0)
NEUT#: 0.2 10*3/uL — CL (ref 1.5–6.5)
RBC: 2.9 10*6/uL — ABNORMAL LOW (ref 3.70–5.45)
RDW: 12.6 % (ref 11.2–14.5)
nRBC: 0 % (ref 0–0)

## 2008-12-18 LAB — CBC WITH DIFFERENTIAL/PLATELET
Basophils Absolute: 0 10*3/uL (ref 0.0–0.1)
Eosinophils Absolute: 0 10*3/uL (ref 0.0–0.5)
HGB: 10 g/dL — ABNORMAL LOW (ref 11.6–15.9)
LYMPH%: 62.7 % — ABNORMAL HIGH (ref 14.0–49.7)
MCV: 79.2 fL — ABNORMAL LOW (ref 79.5–101.0)
MONO#: 0 10*3/uL — ABNORMAL LOW (ref 0.1–0.9)
MONO%: 1.3 % (ref 0.0–14.0)
NEUT#: 0.3 10*3/uL — CL (ref 1.5–6.5)
Platelets: 30 10*3/uL — ABNORMAL LOW (ref 145–400)
RBC: 3.65 10*6/uL — ABNORMAL LOW (ref 3.70–5.45)
RDW: 12.9 % (ref 11.2–14.5)
WBC: 0.8 10*3/uL — CL (ref 3.9–10.3)
nRBC: 0 % (ref 0–0)

## 2008-12-20 LAB — CBC WITH DIFFERENTIAL/PLATELET
EOS%: 0 % (ref 0.0–7.0)
Eosinophils Absolute: 0 10*3/uL (ref 0.0–0.5)
LYMPH%: 63.5 % — ABNORMAL HIGH (ref 14.0–49.7)
MCHC: 34.1 g/dL (ref 31.5–36.0)
MCV: 80.1 fL (ref 79.5–101.0)
MONO%: 1.9 % (ref 0.0–14.0)
NEUT#: 0.4 10*3/uL — CL (ref 1.5–6.5)
Platelets: 22 10*3/uL — ABNORMAL LOW (ref 145–400)
RBC: 3.41 10*6/uL — ABNORMAL LOW (ref 3.70–5.45)
RDW: 13 % (ref 11.2–14.5)

## 2008-12-21 LAB — COMPREHENSIVE METABOLIC PANEL
ALT: 120 U/L — ABNORMAL HIGH (ref 0–35)
Alkaline Phosphatase: 152 U/L — ABNORMAL HIGH (ref 39–117)
Creatinine, Ser: 0.98 mg/dL (ref 0.40–1.20)
Sodium: 138 mEq/L (ref 135–145)
Total Bilirubin: 0.7 mg/dL (ref 0.3–1.2)
Total Protein: 6.5 g/dL (ref 6.0–8.3)

## 2008-12-22 LAB — CBC WITH DIFFERENTIAL/PLATELET
Basophils Absolute: 0 10*3/uL (ref 0.0–0.1)
Eosinophils Absolute: 0 10*3/uL (ref 0.0–0.5)
HCT: 26.6 % — ABNORMAL LOW (ref 34.8–46.6)
HGB: 9 g/dL — ABNORMAL LOW (ref 11.6–15.9)
LYMPH%: 60.4 % — ABNORMAL HIGH (ref 14.0–49.7)
MCV: 79.6 fL (ref 79.5–101.0)
MONO%: 2.2 % (ref 0.0–14.0)
NEUT#: 0.3 10*3/uL — CL (ref 1.5–6.5)
Platelets: 16 10*3/uL — ABNORMAL LOW (ref 145–400)

## 2008-12-25 LAB — CBC WITH DIFFERENTIAL/PLATELET
BASO%: 0 % (ref 0.0–2.0)
Basophils Absolute: 0 10*3/uL (ref 0.0–0.1)
EOS%: 0 % (ref 0.0–7.0)
HCT: 24.9 % — ABNORMAL LOW (ref 34.8–46.6)
LYMPH%: 74.7 % — ABNORMAL HIGH (ref 14.0–49.7)
MCH: 27.3 pg (ref 25.1–34.0)
MCHC: 34.5 g/dL (ref 31.5–36.0)
MONO#: 0 10*3/uL — ABNORMAL LOW (ref 0.1–0.9)
NEUT%: 24 % — ABNORMAL LOW (ref 38.4–76.8)
Platelets: 9 10*3/uL — CL (ref 145–400)

## 2008-12-27 LAB — CBC WITH DIFFERENTIAL/PLATELET
Basophils Absolute: 0 10*3/uL (ref 0.0–0.1)
EOS%: 0.4 % (ref 0.0–7.0)
HGB: 8.3 g/dL — ABNORMAL LOW (ref 11.6–15.9)
LYMPH%: 75.7 % — ABNORMAL HIGH (ref 14.0–49.7)
MCH: 29.4 pg (ref 25.1–34.0)
MCV: 81.3 fL (ref 79.5–101.0)
MONO%: 0.8 % (ref 0.0–14.0)
NEUT%: 22.7 % — ABNORMAL LOW (ref 38.4–76.8)
Platelets: 19 10*3/uL — ABNORMAL LOW (ref 145–400)
RDW: 12.9 % (ref 11.2–14.5)

## 2009-01-01 LAB — HOLD TUBE, BLOOD BANK

## 2009-01-01 LAB — CBC WITH DIFFERENTIAL/PLATELET
BASO%: 0.5 % (ref 0.0–2.0)
EOS%: 0.2 % (ref 0.0–7.0)
MCH: 29.6 pg (ref 25.1–34.0)
MCHC: 36.3 g/dL — ABNORMAL HIGH (ref 31.5–36.0)
MONO#: 0 10*3/uL — ABNORMAL LOW (ref 0.1–0.9)
RBC: 2.51 10*6/uL — ABNORMAL LOW (ref 3.70–5.45)
RDW: 13.3 % (ref 11.2–14.5)
WBC: 0.6 10*3/uL — CL (ref 3.9–10.3)
lymph#: 0.4 10*3/uL — ABNORMAL LOW (ref 0.9–3.3)

## 2009-01-01 LAB — TYPE & CROSSMATCH - CHCC

## 2009-01-03 LAB — CBC WITH DIFFERENTIAL/PLATELET
BASO%: 1.9 % (ref 0.0–2.0)
Eosinophils Absolute: 0 10*3/uL (ref 0.0–0.5)
MCHC: 35.4 g/dL (ref 31.5–36.0)
MONO#: 0 10*3/uL — ABNORMAL LOW (ref 0.1–0.9)
NEUT#: 0.2 10*3/uL — CL (ref 1.5–6.5)
RBC: 3.26 10*6/uL — ABNORMAL LOW (ref 3.70–5.45)
RDW: 13.1 % (ref 11.2–14.5)
WBC: 0.5 10*3/uL — CL (ref 3.9–10.3)
nRBC: 0 % (ref 0–0)

## 2009-01-03 LAB — HOLD TUBE, BLOOD BANK

## 2009-01-04 LAB — COMPREHENSIVE METABOLIC PANEL
ALT: 40 U/L — ABNORMAL HIGH (ref 0–35)
AST: 26 U/L (ref 0–37)
Albumin: 3.9 g/dL (ref 3.5–5.2)
CO2: 23 mEq/L (ref 19–32)
Calcium: 9.2 mg/dL (ref 8.4–10.5)
Chloride: 107 mEq/L (ref 96–112)
Creatinine, Ser: 0.96 mg/dL (ref 0.40–1.20)
Potassium: 3.8 mEq/L (ref 3.5–5.3)
Sodium: 140 mEq/L (ref 135–145)
Total Protein: 6 g/dL (ref 6.0–8.3)

## 2009-01-04 LAB — LACTATE DEHYDROGENASE: LDH: 149 U/L (ref 94–250)

## 2009-01-04 LAB — CYTOMEGALOVIRUS PCR, QUALITATIVE: Cytomegalovirus DNA: NOT DETECTED

## 2009-01-05 ENCOUNTER — Encounter (HOSPITAL_COMMUNITY): Admission: RE | Admit: 2009-01-05 | Discharge: 2009-02-01 | Payer: Self-pay | Admitting: Oncology

## 2009-01-05 LAB — CBC WITH DIFFERENTIAL/PLATELET
Basophils Absolute: 0 10*3/uL (ref 0.0–0.1)
EOS%: 0 % (ref 0.0–7.0)
Eosinophils Absolute: 0 10*3/uL (ref 0.0–0.5)
HGB: 9.2 g/dL — ABNORMAL LOW (ref 11.6–15.9)
MCH: 28.4 pg (ref 25.1–34.0)
MCV: 80.9 fL (ref 79.5–101.0)
MONO%: 1.2 % (ref 0.0–14.0)
NEUT#: 0.2 10*3/uL — CL (ref 1.5–6.5)
RBC: 3.24 10*6/uL — ABNORMAL LOW (ref 3.70–5.45)
RDW: 13 % (ref 11.2–14.5)
lymph#: 0.6 10*3/uL — ABNORMAL LOW (ref 0.9–3.3)
nRBC: 0 % (ref 0–0)

## 2009-01-08 LAB — COMPREHENSIVE METABOLIC PANEL
ALT: 36 U/L — ABNORMAL HIGH (ref 0–35)
AST: 24 U/L (ref 0–37)
Albumin: 3.9 g/dL (ref 3.5–5.2)
BUN: 14 mg/dL (ref 6–23)
CO2: 23 mEq/L (ref 19–32)
Calcium: 9.2 mg/dL (ref 8.4–10.5)
Chloride: 107 mEq/L (ref 96–112)
Potassium: 3.8 mEq/L (ref 3.5–5.3)

## 2009-01-08 LAB — CBC WITH DIFFERENTIAL/PLATELET
BASO%: 0.7 % (ref 0.0–2.0)
Eosinophils Absolute: 0 10*3/uL (ref 0.0–0.5)
HCT: 23.6 % — ABNORMAL LOW (ref 34.8–46.6)
HGB: 8.6 g/dL — ABNORMAL LOW (ref 11.6–15.9)
MCHC: 36.5 g/dL — ABNORMAL HIGH (ref 31.5–36.0)
MONO#: 0 10*3/uL — ABNORMAL LOW (ref 0.1–0.9)
NEUT#: 0.1 10*3/uL — CL (ref 1.5–6.5)
NEUT%: 25.3 % — ABNORMAL LOW (ref 38.4–76.8)
Platelets: 36 10*3/uL — ABNORMAL LOW (ref 145–400)
WBC: 0.5 10*3/uL — CL (ref 3.9–10.3)
lymph#: 0.4 10*3/uL — ABNORMAL LOW (ref 0.9–3.3)

## 2009-01-08 LAB — TECHNOLOGIST REVIEW

## 2009-01-08 LAB — LACTATE DEHYDROGENASE: LDH: 164 U/L (ref 94–250)

## 2009-01-10 LAB — CBC WITH DIFFERENTIAL/PLATELET
BASO%: 2 % (ref 0.0–2.0)
EOS%: 0 % (ref 0.0–7.0)
MCH: 28 pg (ref 25.1–34.0)
MCV: 78.5 fL — ABNORMAL LOW (ref 79.5–101.0)
MONO%: 2 % (ref 0.0–14.0)
RBC: 2.93 10*6/uL — ABNORMAL LOW (ref 3.70–5.45)
RDW: 12.7 % (ref 11.2–14.5)

## 2009-01-10 LAB — TECHNOLOGIST REVIEW

## 2009-01-11 LAB — TYPE & CROSSMATCH - CHCC

## 2009-01-12 LAB — CBC WITH DIFFERENTIAL/PLATELET
Basophils Absolute: 0 10*3/uL (ref 0.0–0.1)
EOS%: 0 % (ref 0.0–7.0)
HCT: 23.6 % — ABNORMAL LOW (ref 34.8–46.6)
HGB: 8.5 g/dL — ABNORMAL LOW (ref 11.6–15.9)
LYMPH%: 80 % — ABNORMAL HIGH (ref 14.0–49.7)
MCH: 28 pg (ref 25.1–34.0)
MCV: 77.6 fL — ABNORMAL LOW (ref 79.5–101.0)
NEUT%: 17.8 % — ABNORMAL LOW (ref 38.4–76.8)
Platelets: 11 10*3/uL — ABNORMAL LOW (ref 145–400)
lymph#: 0.8 10*3/uL — ABNORMAL LOW (ref 0.9–3.3)

## 2009-01-13 LAB — COMPREHENSIVE METABOLIC PANEL
ALT: 33 U/L (ref 0–35)
AST: 24 U/L (ref 0–37)
Albumin: 3.9 g/dL (ref 3.5–5.2)
CO2: 23 mEq/L (ref 19–32)
Calcium: 9.2 mg/dL (ref 8.4–10.5)
Chloride: 108 mEq/L (ref 96–112)
Potassium: 4 mEq/L (ref 3.5–5.3)
Total Protein: 6.2 g/dL (ref 6.0–8.3)

## 2009-01-13 LAB — LACTATE DEHYDROGENASE: LDH: 162 U/L (ref 94–250)

## 2009-01-15 LAB — CBC WITH DIFFERENTIAL/PLATELET
BASO%: 0 % (ref 0.0–2.0)
MCHC: 35.5 g/dL (ref 31.5–36.0)
MONO#: 0 10*3/uL — ABNORMAL LOW (ref 0.1–0.9)
RBC: 3.64 10*6/uL — ABNORMAL LOW (ref 3.70–5.45)
RDW: 12.9 % (ref 11.2–14.5)
WBC: 0.9 10*3/uL — CL (ref 3.9–10.3)
lymph#: 0.7 10*3/uL — ABNORMAL LOW (ref 0.9–3.3)

## 2009-01-15 LAB — TECHNOLOGIST REVIEW

## 2009-01-16 ENCOUNTER — Ambulatory Visit: Payer: Self-pay | Admitting: Oncology

## 2009-01-17 LAB — CBC WITH DIFFERENTIAL/PLATELET
Basophils Absolute: 0 10*3/uL (ref 0.0–0.1)
EOS%: 2 % (ref 0.0–7.0)
Eosinophils Absolute: 0 10*3/uL (ref 0.0–0.5)
HGB: 8.9 g/dL — ABNORMAL LOW (ref 11.6–15.9)
MCH: 27.8 pg (ref 25.1–34.0)
NEUT#: 0.1 10*3/uL — CL (ref 1.5–6.5)
RDW: 12.5 % (ref 11.2–14.5)
WBC: 0.5 10*3/uL — CL (ref 3.9–10.3)
lymph#: 0.4 10*3/uL — ABNORMAL LOW (ref 0.9–3.3)

## 2009-01-17 LAB — TECHNOLOGIST REVIEW

## 2009-01-17 LAB — HOLD TUBE, BLOOD BANK

## 2009-01-19 LAB — CBC WITH DIFFERENTIAL/PLATELET
BASO%: 2.2 % — ABNORMAL HIGH (ref 0.0–2.0)
Eosinophils Absolute: 0 10*3/uL (ref 0.0–0.5)
HCT: 25 % — ABNORMAL LOW (ref 34.8–46.6)
LYMPH%: 75.3 % — ABNORMAL HIGH (ref 14.0–49.7)
MCHC: 35.6 g/dL (ref 31.5–36.0)
MONO#: 0 10*3/uL — ABNORMAL LOW (ref 0.1–0.9)
NEUT#: 0.2 10*3/uL — CL (ref 1.5–6.5)
NEUT%: 17.1 % — ABNORMAL LOW (ref 38.4–76.8)
Platelets: 33 10*3/uL — ABNORMAL LOW (ref 145–400)
WBC: 0.9 10*3/uL — CL (ref 3.9–10.3)
lymph#: 0.7 10*3/uL — ABNORMAL LOW (ref 0.9–3.3)
nRBC: 0 % (ref 0–0)

## 2009-01-20 LAB — CYTOMEGALOVIRUS PCR, QUALITATIVE

## 2009-01-22 LAB — CBC WITH DIFFERENTIAL/PLATELET
Basophils Absolute: 0 10*3/uL (ref 0.0–0.1)
Eosinophils Absolute: 0 10*3/uL (ref 0.0–0.5)
HCT: 21.6 % — ABNORMAL LOW (ref 34.8–46.6)
HGB: 7.6 g/dL — ABNORMAL LOW (ref 11.6–15.9)
MCH: 27.3 pg (ref 25.1–34.0)
MCV: 77.7 fL — ABNORMAL LOW (ref 79.5–101.0)
NEUT#: 0.2 10*3/uL — CL (ref 1.5–6.5)
NEUT%: 23.2 % — ABNORMAL LOW (ref 38.4–76.8)
RDW: 12.5 % (ref 11.2–14.5)
lymph#: 0.5 10*3/uL — ABNORMAL LOW (ref 0.9–3.3)

## 2009-01-22 LAB — TYPE & CROSSMATCH - CHCC

## 2009-01-22 LAB — HOLD TUBE, BLOOD BANK

## 2009-01-22 LAB — TECHNOLOGIST REVIEW

## 2009-01-25 LAB — CYTOMEGALOVIRUS PCR, QUALITATIVE: Cytomegalovirus DNA: NOT DETECTED

## 2009-01-26 LAB — CBC WITH DIFFERENTIAL/PLATELET
Eosinophils Absolute: 0 10*3/uL (ref 0.0–0.5)
MONO#: 0 10*3/uL — ABNORMAL LOW (ref 0.1–0.9)
NEUT#: 0.2 10*3/uL — CL (ref 1.5–6.5)
RBC: 3.19 10*6/uL — ABNORMAL LOW (ref 3.70–5.45)
RDW: 13.5 % (ref 11.2–14.5)
WBC: 0.7 10*3/uL — CL (ref 3.9–10.3)
lymph#: 0.5 10*3/uL — ABNORMAL LOW (ref 0.9–3.3)

## 2009-01-31 LAB — CBC WITH DIFFERENTIAL/PLATELET
Basophils Absolute: 0 10*3/uL (ref 0.0–0.1)
EOS%: 0.3 % (ref 0.0–7.0)
HCT: 23.5 % — ABNORMAL LOW (ref 34.8–46.6)
HGB: 8.3 g/dL — ABNORMAL LOW (ref 11.6–15.9)
MONO#: 0 10*3/uL — ABNORMAL LOW (ref 0.1–0.9)
NEUT#: 0.2 10*3/uL — CL (ref 1.5–6.5)
NEUT%: 34.3 % — ABNORMAL LOW (ref 38.4–76.8)
RDW: 13 % (ref 11.2–14.5)
WBC: 0.7 10*3/uL — CL (ref 3.9–10.3)
lymph#: 0.4 10*3/uL — ABNORMAL LOW (ref 0.9–3.3)

## 2009-01-31 LAB — HOLD TUBE, BLOOD BANK

## 2009-02-02 LAB — CBC WITH DIFFERENTIAL/PLATELET
Eosinophils Absolute: 0 10*3/uL (ref 0.0–0.5)
MONO#: 0.1 10*3/uL (ref 0.1–0.9)
NEUT#: 0.3 10*3/uL — CL (ref 1.5–6.5)
RBC: 3.91 10*6/uL (ref 3.70–5.45)
RDW: 13.9 % (ref 11.2–14.5)
WBC: 1 10*3/uL — ABNORMAL LOW (ref 3.9–10.3)
lymph#: 0.7 10*3/uL — ABNORMAL LOW (ref 0.9–3.3)
nRBC: 0 % (ref 0–0)

## 2009-02-03 LAB — CYTOMEGALOVIRUS PCR, QUALITATIVE: Cytomegalovirus DNA: NOT DETECTED

## 2009-02-05 ENCOUNTER — Encounter (HOSPITAL_COMMUNITY): Admission: RE | Admit: 2009-02-05 | Discharge: 2009-03-28 | Payer: Self-pay | Admitting: Oncology

## 2009-02-05 LAB — CBC WITH DIFFERENTIAL/PLATELET
BASO%: 1.4 % (ref 0.0–2.0)
Basophils Absolute: 0 10*3/uL (ref 0.0–0.1)
HCT: 29 % — ABNORMAL LOW (ref 34.8–46.6)
HGB: 10.1 g/dL — ABNORMAL LOW (ref 11.6–15.9)
MCHC: 34.8 g/dL (ref 31.5–36.0)
MONO#: 0.1 10*3/uL (ref 0.1–0.9)
NEUT%: 24.6 % — ABNORMAL LOW (ref 38.4–76.8)
WBC: 0.7 10*3/uL — CL (ref 3.9–10.3)
lymph#: 0.5 10*3/uL — ABNORMAL LOW (ref 0.9–3.3)

## 2009-02-07 LAB — CBC WITH DIFFERENTIAL/PLATELET
Basophils Absolute: 0 10*3/uL (ref 0.0–0.1)
EOS%: 1 % (ref 0.0–7.0)
Eosinophils Absolute: 0 10*3/uL (ref 0.0–0.5)
HCT: 27.4 % — ABNORMAL LOW (ref 34.8–46.6)
HGB: 9.2 g/dL — ABNORMAL LOW (ref 11.6–15.9)
MCH: 26.7 pg (ref 25.1–34.0)
MONO#: 0.1 10*3/uL (ref 0.1–0.9)
NEUT%: 23 % — ABNORMAL LOW (ref 38.4–76.8)
lymph#: 0.7 10*3/uL — ABNORMAL LOW (ref 0.9–3.3)

## 2009-02-09 LAB — CBC WITH DIFFERENTIAL/PLATELET
Eosinophils Absolute: 0 10*3/uL (ref 0.0–0.5)
LYMPH%: 61.2 % — ABNORMAL HIGH (ref 14.0–49.7)
MCH: 28.7 pg (ref 25.1–34.0)
MCV: 80.9 fL (ref 79.5–101.0)
MONO%: 3.8 % (ref 0.0–14.0)
NEUT#: 0.2 10*3/uL — CL (ref 1.5–6.5)
Platelets: 22 10*3/uL — ABNORMAL LOW (ref 145–400)
RBC: 3.13 10*6/uL — ABNORMAL LOW (ref 3.70–5.45)

## 2009-02-12 LAB — CBC WITH DIFFERENTIAL/PLATELET
Basophils Absolute: 0 10*3/uL (ref 0.0–0.1)
Eosinophils Absolute: 0 10*3/uL (ref 0.0–0.5)
HGB: 8.4 g/dL — ABNORMAL LOW (ref 11.6–15.9)
LYMPH%: 69.6 % — ABNORMAL HIGH (ref 14.0–49.7)
MCV: 78.7 fL — ABNORMAL LOW (ref 79.5–101.0)
MONO%: 7.6 % (ref 0.0–14.0)
NEUT#: 0.2 10*3/uL — CL (ref 1.5–6.5)
Platelets: 11 10*3/uL — ABNORMAL LOW (ref 145–400)
RBC: 3.1 10*6/uL — ABNORMAL LOW (ref 3.70–5.45)

## 2009-02-12 LAB — CYTOMEGALOVIRUS PCR, QUALITATIVE

## 2009-02-16 LAB — CBC WITH DIFFERENTIAL/PLATELET
Basophils Absolute: 0 10*3/uL (ref 0.0–0.1)
Eosinophils Absolute: 0 10*3/uL (ref 0.0–0.5)
HGB: 9.6 g/dL — ABNORMAL LOW (ref 11.6–15.9)
LYMPH%: 64 % — ABNORMAL HIGH (ref 14.0–49.7)
MCV: 81.3 fL (ref 79.5–101.0)
MONO#: 0.1 10*3/uL (ref 0.1–0.9)
NEUT#: 0.2 10*3/uL — CL (ref 1.5–6.5)
Platelets: 22 10*3/uL — ABNORMAL LOW (ref 145–400)
RBC: 3.31 10*6/uL — ABNORMAL LOW (ref 3.70–5.45)
RDW: 13.9 % (ref 11.2–14.5)
WBC: 0.9 10*3/uL — CL (ref 3.9–10.3)

## 2009-02-19 LAB — CBC WITH DIFFERENTIAL/PLATELET
BASO%: 0 % (ref 0.0–2.0)
Basophils Absolute: 0 10e3/uL (ref 0.0–0.1)
EOS%: 0 % (ref 0.0–7.0)
Eosinophils Absolute: 0 10e3/uL (ref 0.0–0.5)
HCT: 27.9 % — ABNORMAL LOW (ref 34.8–46.6)
HGB: 9.7 g/dL — ABNORMAL LOW (ref 11.6–15.9)
LYMPH%: 59.8 % — ABNORMAL HIGH (ref 14.0–49.7)
MCH: 27.7 pg (ref 25.1–34.0)
MCHC: 34.8 g/dL (ref 31.5–36.0)
MCV: 79.7 fL (ref 79.5–101.0)
MONO#: 0.1 10e3/uL (ref 0.1–0.9)
MONO%: 13.8 % (ref 0.0–14.0)
NEUT#: 0.2 10e3/uL — CL (ref 1.5–6.5)
NEUT%: 26.4 % — ABNORMAL LOW (ref 38.4–76.8)
Platelets: 13 10e3/uL — ABNORMAL LOW (ref 145–400)
RBC: 3.5 10e6/uL — ABNORMAL LOW (ref 3.70–5.45)
RDW: 13.8 % (ref 11.2–14.5)
WBC: 0.9 10e3/uL — CL (ref 3.9–10.3)
lymph#: 0.5 10e3/uL — ABNORMAL LOW (ref 0.9–3.3)
nRBC: 0 % (ref 0–0)

## 2009-02-20 LAB — CYTOMEGALOVIRUS PCR, QUALITATIVE: Cytomegalovirus DNA: NOT DETECTED

## 2009-02-21 LAB — CBC WITH DIFFERENTIAL/PLATELET
Basophils Absolute: 0 10*3/uL (ref 0.0–0.1)
EOS%: 0 % (ref 0.0–7.0)
Eosinophils Absolute: 0 10*3/uL (ref 0.0–0.5)
HGB: 9 g/dL — ABNORMAL LOW (ref 11.6–15.9)
MONO#: 0.2 10*3/uL (ref 0.1–0.9)
NEUT#: 0.3 10*3/uL — CL (ref 1.5–6.5)
RDW: 13.6 % (ref 11.2–14.5)
WBC: 1.3 10*3/uL — ABNORMAL LOW (ref 3.9–10.3)
lymph#: 0.8 10*3/uL — ABNORMAL LOW (ref 0.9–3.3)

## 2009-02-22 LAB — CYTOMEGALOVIRUS PCR, QUALITATIVE: Cytomegalovirus DNA: NOT DETECTED

## 2009-02-23 LAB — CBC WITH DIFFERENTIAL/PLATELET
BASO%: 0 % (ref 0.0–2.0)
EOS%: 0.9 % (ref 0.0–7.0)
HCT: 25.1 % — ABNORMAL LOW (ref 34.8–46.6)
HGB: 8.6 g/dL — ABNORMAL LOW (ref 11.6–15.9)
MCH: 27.3 pg (ref 25.1–34.0)
MCHC: 34.3 g/dL (ref 31.5–36.0)
MONO#: 0.1 10*3/uL (ref 0.1–0.9)
RDW: 13.7 % (ref 11.2–14.5)
WBC: 1.1 10*3/uL — ABNORMAL LOW (ref 3.9–10.3)
lymph#: 0.7 10*3/uL — ABNORMAL LOW (ref 0.9–3.3)

## 2009-02-23 LAB — HOLD TUBE, BLOOD BANK

## 2009-02-23 LAB — TECHNOLOGIST REVIEW

## 2009-02-27 LAB — CBC WITH DIFFERENTIAL/PLATELET
BASO%: 0 % (ref 0.0–2.0)
EOS%: 1.1 % (ref 0.0–7.0)
LYMPH%: 60 % — ABNORMAL HIGH (ref 14.0–49.7)
MCHC: 34.9 g/dL (ref 31.5–36.0)
MCV: 79.5 fL (ref 79.5–101.0)
MONO%: 14.4 % — ABNORMAL HIGH (ref 0.0–14.0)
Platelets: 20 10*3/uL — ABNORMAL LOW (ref 145–400)
RBC: 2.88 10*6/uL — ABNORMAL LOW (ref 3.70–5.45)

## 2009-02-28 LAB — TYPE & CROSSMATCH - CHCC

## 2009-03-01 ENCOUNTER — Ambulatory Visit: Payer: Self-pay | Admitting: Oncology

## 2009-03-02 LAB — CBC WITH DIFFERENTIAL/PLATELET
Basophils Absolute: 0 10*3/uL (ref 0.0–0.1)
EOS%: 0 % (ref 0.0–7.0)
Eosinophils Absolute: 0 10*3/uL (ref 0.0–0.5)
HCT: 26 % — ABNORMAL LOW (ref 34.8–46.6)
HGB: 9.1 g/dL — ABNORMAL LOW (ref 11.6–15.9)
MCH: 28 pg (ref 25.1–34.0)
MONO#: 0.1 10*3/uL (ref 0.1–0.9)
NEUT%: 30.8 % — ABNORMAL LOW (ref 38.4–76.8)
lymph#: 0.4 10*3/uL — ABNORMAL LOW (ref 0.9–3.3)

## 2009-03-05 LAB — COMPREHENSIVE METABOLIC PANEL
ALT: 29 U/L (ref 0–35)
Albumin: 3.8 g/dL (ref 3.5–5.2)
CO2: 24 mEq/L (ref 19–32)
Calcium: 9.4 mg/dL (ref 8.4–10.5)
Chloride: 107 mEq/L (ref 96–112)
Potassium: 3.8 mEq/L (ref 3.5–5.3)
Sodium: 139 mEq/L (ref 135–145)
Total Protein: 6.4 g/dL (ref 6.0–8.3)

## 2009-03-05 LAB — MANUAL DIFFERENTIAL
ALC: 0.6 10*3/uL — ABNORMAL LOW (ref 0.9–3.3)
Band Neutrophils: 2 % (ref 0–10)
LYMPH: 64 % — ABNORMAL HIGH (ref 14–49)
MONO: 19 % — ABNORMAL HIGH (ref 0–14)
SEG: 13 % — ABNORMAL LOW (ref 38–77)
nRBC: 1 % — ABNORMAL HIGH (ref 0–0)

## 2009-03-05 LAB — CBC WITH DIFFERENTIAL/PLATELET
MCHC: 34.7 g/dL (ref 31.5–36.0)
RBC: 3.37 10*6/uL — ABNORMAL LOW (ref 3.70–5.45)
nRBC: 0 % (ref 0–0)

## 2009-03-05 LAB — CYTOMEGALOVIRUS PCR, QUALITATIVE: Cytomegalovirus DNA: NOT DETECTED

## 2009-03-05 LAB — LACTATE DEHYDROGENASE: LDH: 183 U/L (ref 94–250)

## 2009-03-07 LAB — CBC WITH DIFFERENTIAL/PLATELET
Eosinophils Absolute: 0 10*3/uL (ref 0.0–0.5)
HCT: 26 % — ABNORMAL LOW (ref 34.8–46.6)
HGB: 9 g/dL — ABNORMAL LOW (ref 11.6–15.9)
LYMPH%: 52.2 % — ABNORMAL HIGH (ref 14.0–49.7)
MONO#: 0.3 10*3/uL (ref 0.1–0.9)
NEUT#: 0.4 10*3/uL — CL (ref 1.5–6.5)
NEUT%: 25.5 % — ABNORMAL LOW (ref 38.4–76.8)
Platelets: 14 10*3/uL — ABNORMAL LOW (ref 145–400)
WBC: 1.6 10*3/uL — ABNORMAL LOW (ref 3.9–10.3)

## 2009-03-09 LAB — CBC WITH DIFFERENTIAL/PLATELET
Basophils Absolute: 0 10*3/uL (ref 0.0–0.1)
EOS%: 0.8 % (ref 0.0–7.0)
Eosinophils Absolute: 0 10*3/uL (ref 0.0–0.5)
HCT: 24.2 % — ABNORMAL LOW (ref 34.8–46.6)
HGB: 8.6 g/dL — ABNORMAL LOW (ref 11.6–15.9)
MCH: 28.5 pg (ref 25.1–34.0)
MCV: 80.1 fL (ref 79.5–101.0)
MONO%: 13.9 % (ref 0.0–14.0)
NEUT#: 0.3 10*3/uL — CL (ref 1.5–6.5)
NEUT%: 27.1 % — ABNORMAL LOW (ref 38.4–76.8)
lymph#: 0.7 10*3/uL — ABNORMAL LOW (ref 0.9–3.3)

## 2009-03-12 LAB — CBC WITH DIFFERENTIAL/PLATELET
Basophils Absolute: 0 10*3/uL (ref 0.0–0.1)
EOS%: 0.2 % (ref 0.0–7.0)
Eosinophils Absolute: 0 10*3/uL (ref 0.0–0.5)
HGB: 8.1 g/dL — ABNORMAL LOW (ref 11.6–15.9)
LYMPH%: 45.7 % (ref 14.0–49.7)
MCH: 30 pg (ref 25.1–34.0)
MCV: 82.1 fL (ref 79.5–101.0)
MONO%: 21 % — ABNORMAL HIGH (ref 0.0–14.0)
Platelets: 15 10*3/uL — ABNORMAL LOW (ref 145–400)
RDW: 14 % (ref 11.2–14.5)

## 2009-03-14 LAB — CBC WITH DIFFERENTIAL/PLATELET
Basophils Absolute: 0 10*3/uL (ref 0.0–0.1)
EOS%: 0.9 % (ref 0.0–7.0)
Eosinophils Absolute: 0 10*3/uL (ref 0.0–0.5)
HGB: 9.8 g/dL — ABNORMAL LOW (ref 11.6–15.9)
LYMPH%: 57.3 % — ABNORMAL HIGH (ref 14.0–49.7)
MCH: 28.2 pg (ref 25.1–34.0)
MCV: 81.3 fL (ref 79.5–101.0)
MONO%: 10 % (ref 0.0–14.0)
NEUT#: 0.4 10*3/uL — CL (ref 1.5–6.5)
NEUT%: 31.8 % — ABNORMAL LOW (ref 38.4–76.8)
Platelets: 17 10*3/uL — ABNORMAL LOW (ref 145–400)

## 2009-03-14 LAB — CYTOMEGALOVIRUS PCR, QUALITATIVE: Cytomegalovirus DNA: NOT DETECTED

## 2009-03-16 LAB — CBC WITH DIFFERENTIAL/PLATELET
Basophils Absolute: 0 10*3/uL (ref 0.0–0.1)
Eosinophils Absolute: 0 10*3/uL (ref 0.0–0.5)
HCT: 28.2 % — ABNORMAL LOW (ref 34.8–46.6)
HGB: 9.9 g/dL — ABNORMAL LOW (ref 11.6–15.9)
LYMPH%: 58.4 % — ABNORMAL HIGH (ref 14.0–49.7)
MONO#: 0.3 10*3/uL (ref 0.1–0.9)
NEUT#: 0.4 10*3/uL — CL (ref 1.5–6.5)
NEUT%: 24.8 % — ABNORMAL LOW (ref 38.4–76.8)
Platelets: 18 10*3/uL — ABNORMAL LOW (ref 145–400)
RBC: 3.46 10*6/uL — ABNORMAL LOW (ref 3.70–5.45)
WBC: 1.5 10*3/uL — ABNORMAL LOW (ref 3.9–10.3)
nRBC: 0 % (ref 0–0)

## 2009-03-19 LAB — CBC WITH DIFFERENTIAL/PLATELET
BASO%: 0.6 % (ref 0.0–2.0)
EOS%: 0.6 % (ref 0.0–7.0)
HCT: 27.1 % — ABNORMAL LOW (ref 34.8–46.6)
LYMPH%: 59.1 % — ABNORMAL HIGH (ref 14.0–49.7)
MCH: 28.5 pg (ref 25.1–34.0)
MCHC: 35.1 g/dL (ref 31.5–36.0)
MCV: 81.4 fL (ref 79.5–101.0)
MONO%: 24.3 % — ABNORMAL HIGH (ref 0.0–14.0)
NEUT%: 15.4 % — ABNORMAL LOW (ref 38.4–76.8)
Platelets: 19 10*3/uL — ABNORMAL LOW (ref 145–400)
lymph#: 1.1 10*3/uL (ref 0.9–3.3)

## 2009-03-21 LAB — CBC WITH DIFFERENTIAL/PLATELET
BASO%: 0.6 % (ref 0.0–2.0)
LYMPH%: 56.9 % — ABNORMAL HIGH (ref 14.0–49.7)
MCH: 28.8 pg (ref 25.1–34.0)
MCHC: 35.1 g/dL (ref 31.5–36.0)
MCV: 82.1 fL (ref 79.5–101.0)
MONO%: 19.8 % — ABNORMAL HIGH (ref 0.0–14.0)
NEUT%: 20.9 % — ABNORMAL LOW (ref 38.4–76.8)
Platelets: 24 10*3/uL — ABNORMAL LOW (ref 145–400)
RBC: 3.3 10*6/uL — ABNORMAL LOW (ref 3.70–5.45)

## 2009-03-21 LAB — TECHNOLOGIST REVIEW

## 2009-03-23 LAB — CBC WITH DIFFERENTIAL/PLATELET
BASO%: 0.7 % (ref 0.0–2.0)
EOS%: 1.5 % (ref 0.0–7.0)
HCT: 23.1 % — ABNORMAL LOW (ref 34.8–46.6)
MCH: 28.4 pg (ref 25.1–34.0)
MCHC: 35.1 g/dL (ref 31.5–36.0)
MCV: 81.1 fL (ref 79.5–101.0)
MONO%: 20 % — ABNORMAL HIGH (ref 0.0–14.0)
NEUT%: 19.3 % — ABNORMAL LOW (ref 38.4–76.8)
RDW: 14.5 % (ref 11.2–14.5)
lymph#: 0.8 10*3/uL — ABNORMAL LOW (ref 0.9–3.3)

## 2009-03-25 LAB — CYTOMEGALOVIRUS PCR, QUALITATIVE: Cytomegalovirus DNA: NOT DETECTED

## 2009-03-26 LAB — CBC WITH DIFFERENTIAL/PLATELET
BASO%: 0.7 % (ref 0.0–2.0)
HCT: 22.8 % — ABNORMAL LOW (ref 34.8–46.6)
HGB: 8.3 g/dL — ABNORMAL LOW (ref 11.6–15.9)
MCHC: 36.5 g/dL — ABNORMAL HIGH (ref 31.5–36.0)
MONO#: 0.2 10*3/uL (ref 0.1–0.9)
NEUT%: 24.2 % — ABNORMAL LOW (ref 38.4–76.8)
RDW: 15.2 % — ABNORMAL HIGH (ref 11.2–14.5)
WBC: 1.4 10*3/uL — ABNORMAL LOW (ref 3.9–10.3)
lymph#: 0.8 10*3/uL — ABNORMAL LOW (ref 0.9–3.3)

## 2009-03-28 LAB — CBC WITH DIFFERENTIAL/PLATELET
Basophils Absolute: 0 10*3/uL (ref 0.0–0.1)
Eosinophils Absolute: 0 10*3/uL (ref 0.0–0.5)
HGB: 10.6 g/dL — ABNORMAL LOW (ref 11.6–15.9)
MCV: 82.1 fL (ref 79.5–101.0)
MONO#: 0.2 10*3/uL (ref 0.1–0.9)
MONO%: 13.1 % (ref 0.0–14.0)
NEUT#: 0.4 10*3/uL — CL (ref 1.5–6.5)
RBC: 3.57 10*6/uL — ABNORMAL LOW (ref 3.70–5.45)
RDW: 14.8 % — ABNORMAL HIGH (ref 11.2–14.5)
WBC: 1.4 10*3/uL — ABNORMAL LOW (ref 3.9–10.3)
lymph#: 0.8 10*3/uL — ABNORMAL LOW (ref 0.9–3.3)
nRBC: 0 % (ref 0–0)

## 2009-03-28 LAB — TECHNOLOGIST REVIEW

## 2009-03-30 LAB — CBC WITH DIFFERENTIAL/PLATELET
Basophils Absolute: 0 10*3/uL (ref 0.0–0.1)
Eosinophils Absolute: 0 10*3/uL (ref 0.0–0.5)
HCT: 28.6 % — ABNORMAL LOW (ref 34.8–46.6)
HGB: 10.4 g/dL — ABNORMAL LOW (ref 11.6–15.9)
MONO#: 0.1 10*3/uL (ref 0.1–0.9)
NEUT#: 0.3 10*3/uL — CL (ref 1.5–6.5)
NEUT%: 32.2 % — ABNORMAL LOW (ref 38.4–76.8)
WBC: 0.9 10*3/uL — CL (ref 3.9–10.3)
lymph#: 0.5 10*3/uL — ABNORMAL LOW (ref 0.9–3.3)

## 2009-04-03 ENCOUNTER — Ambulatory Visit: Payer: Self-pay | Admitting: Oncology

## 2009-04-03 LAB — CBC WITH DIFFERENTIAL/PLATELET
Basophils Absolute: 0 10*3/uL (ref 0.0–0.1)
Eosinophils Absolute: 0 10*3/uL (ref 0.0–0.5)
HCT: 27.4 % — ABNORMAL LOW (ref 34.8–46.6)
HGB: 9.7 g/dL — ABNORMAL LOW (ref 11.6–15.9)
LYMPH%: 66.1 % — ABNORMAL HIGH (ref 14.0–49.7)
MCH: 29.5 pg (ref 25.1–34.0)
MCV: 83.3 fL (ref 79.5–101.0)
MONO%: 14.3 % — ABNORMAL HIGH (ref 0.0–14.0)
NEUT#: 0.3 10*3/uL — CL (ref 1.5–6.5)
NEUT%: 17.2 % — ABNORMAL LOW (ref 38.4–76.8)
Platelets: 25 10*3/uL — ABNORMAL LOW (ref 145–400)
RDW: 15.1 % — ABNORMAL HIGH (ref 11.2–14.5)

## 2009-04-03 LAB — COMPREHENSIVE METABOLIC PANEL
Albumin: 3.9 g/dL (ref 3.5–5.2)
BUN: 17 mg/dL (ref 6–23)
CO2: 25 mEq/L (ref 19–32)
Calcium: 9.3 mg/dL (ref 8.4–10.5)
Chloride: 107 mEq/L (ref 96–112)
Creatinine, Ser: 0.96 mg/dL (ref 0.40–1.20)
Potassium: 3.7 mEq/L (ref 3.5–5.3)

## 2009-04-03 LAB — LACTATE DEHYDROGENASE: LDH: 242 U/L (ref 94–250)

## 2009-04-04 LAB — CBC WITH DIFFERENTIAL/PLATELET
BASO%: 0 % (ref 0.0–2.0)
HCT: 25.6 % — ABNORMAL LOW (ref 34.8–46.6)
LYMPH%: 46 % (ref 14.0–49.7)
MCHC: 35.9 g/dL (ref 31.5–36.0)
MCV: 82.1 fL (ref 79.5–101.0)
MONO#: 0.6 10*3/uL (ref 0.1–0.9)
MONO%: 26.5 % — ABNORMAL HIGH (ref 0.0–14.0)
NEUT%: 27 % — ABNORMAL LOW (ref 38.4–76.8)
Platelets: 29 10*3/uL — ABNORMAL LOW (ref 145–400)
WBC: 2.2 10*3/uL — ABNORMAL LOW (ref 3.9–10.3)

## 2009-04-06 LAB — CBC WITH DIFFERENTIAL/PLATELET
Basophils Absolute: 0 10*3/uL (ref 0.0–0.1)
Eosinophils Absolute: 0 10*3/uL (ref 0.0–0.5)
HGB: 9.2 g/dL — ABNORMAL LOW (ref 11.6–15.9)
LYMPH%: 58.6 % — ABNORMAL HIGH (ref 14.0–49.7)
MCV: 85.9 fL (ref 79.5–101.0)
MONO%: 16.4 % — ABNORMAL HIGH (ref 0.0–14.0)
NEUT#: 0.3 10*3/uL — CL (ref 1.5–6.5)
Platelets: 26 10*3/uL — ABNORMAL LOW (ref 145–400)
RBC: 2.97 10*6/uL — ABNORMAL LOW (ref 3.70–5.45)

## 2009-04-08 LAB — CYTOMEGALOVIRUS PCR, QUALITATIVE: Cytomegalovirus DNA: NOT DETECTED

## 2009-04-09 LAB — CBC WITH DIFFERENTIAL/PLATELET
BASO%: 0 % (ref 0.0–2.0)
Basophils Absolute: 0 10*3/uL (ref 0.0–0.1)
Eosinophils Absolute: 0 10*3/uL (ref 0.0–0.5)
HCT: 24.6 % — ABNORMAL LOW (ref 34.8–46.6)
HGB: 8.7 g/dL — ABNORMAL LOW (ref 11.6–15.9)
LYMPH%: 60.7 % — ABNORMAL HIGH (ref 14.0–49.7)
MONO#: 0.3 10*3/uL (ref 0.1–0.9)
NEUT#: 0.2 10*3/uL — CL (ref 1.5–6.5)
NEUT%: 14.8 % — ABNORMAL LOW (ref 38.4–76.8)
Platelets: 29 10*3/uL — ABNORMAL LOW (ref 145–400)
WBC: 1.4 10*3/uL — ABNORMAL LOW (ref 3.9–10.3)
lymph#: 0.8 10*3/uL — ABNORMAL LOW (ref 0.9–3.3)

## 2009-04-11 ENCOUNTER — Encounter (HOSPITAL_COMMUNITY): Admission: RE | Admit: 2009-04-11 | Discharge: 2009-06-28 | Payer: Self-pay | Admitting: Oncology

## 2009-04-11 LAB — CBC WITH DIFFERENTIAL/PLATELET
Basophils Absolute: 0 10*3/uL (ref 0.0–0.1)
EOS%: 1.2 % (ref 0.0–7.0)
HCT: 22.7 % — ABNORMAL LOW (ref 34.8–46.6)
HGB: 8 g/dL — ABNORMAL LOW (ref 11.6–15.9)
LYMPH%: 49.7 % (ref 14.0–49.7)
MCH: 29.4 pg (ref 25.1–34.0)
MCHC: 35.2 g/dL (ref 31.5–36.0)
MCV: 83.5 fL (ref 79.5–101.0)
NEUT%: 24.5 % — ABNORMAL LOW (ref 38.4–76.8)
Platelets: 32 10*3/uL — ABNORMAL LOW (ref 145–400)
lymph#: 0.8 10*3/uL — ABNORMAL LOW (ref 0.9–3.3)

## 2009-04-11 LAB — HOLD TUBE, BLOOD BANK

## 2009-04-13 LAB — CBC WITH DIFFERENTIAL/PLATELET
Basophils Absolute: 0 10*3/uL (ref 0.0–0.1)
Eosinophils Absolute: 0 10*3/uL (ref 0.0–0.5)
HCT: 27.3 % — ABNORMAL LOW (ref 34.8–46.6)
HGB: 9.6 g/dL — ABNORMAL LOW (ref 11.6–15.9)
LYMPH%: 49 % (ref 14.0–49.7)
MONO#: 0.3 10*3/uL (ref 0.1–0.9)
NEUT#: 0.3 10*3/uL — CL (ref 1.5–6.5)
NEUT%: 24.1 % — ABNORMAL LOW (ref 38.4–76.8)
Platelets: 25 10*3/uL — ABNORMAL LOW (ref 145–400)
WBC: 1 10*3/uL — ABNORMAL LOW (ref 3.9–10.3)
nRBC: 0 % (ref 0–0)

## 2009-04-13 LAB — URINE CULTURE

## 2009-04-14 LAB — CYTOMEGALOVIRUS PCR, QUALITATIVE: Cytomegalovirus DNA: NOT DETECTED

## 2009-04-16 ENCOUNTER — Other Ambulatory Visit: Admission: RE | Admit: 2009-04-16 | Discharge: 2009-04-16 | Payer: Self-pay | Admitting: Oncology

## 2009-04-16 LAB — CBC WITH DIFFERENTIAL/PLATELET
BASO%: 0.7 % (ref 0.0–2.0)
HCT: 28 % — ABNORMAL LOW (ref 34.8–46.6)
HGB: 10.2 g/dL — ABNORMAL LOW (ref 11.6–15.9)
MCHC: 36.3 g/dL — ABNORMAL HIGH (ref 31.5–36.0)
MONO#: 0.3 10*3/uL (ref 0.1–0.9)
NEUT%: 17.4 % — ABNORMAL LOW (ref 38.4–76.8)
WBC: 1.1 10*3/uL — ABNORMAL LOW (ref 3.9–10.3)
lymph#: 0.6 10*3/uL — ABNORMAL LOW (ref 0.9–3.3)

## 2009-04-17 LAB — FLOW CYTOMETRY

## 2009-04-23 LAB — CBC WITH DIFFERENTIAL/PLATELET
BASO%: 0.7 % (ref 0.0–2.0)
Basophils Absolute: 0 10*3/uL (ref 0.0–0.1)
EOS%: 2 % (ref 0.0–7.0)
HCT: 28.4 % — ABNORMAL LOW (ref 34.8–46.6)
HGB: 9.8 g/dL — ABNORMAL LOW (ref 11.6–15.9)
MCH: 28.9 pg (ref 25.1–34.0)
MCHC: 34.5 g/dL (ref 31.5–36.0)
MCV: 83.8 fL (ref 79.5–101.0)
MONO%: 12.2 % (ref 0.0–14.0)
NEUT%: 26.6 % — ABNORMAL LOW (ref 38.4–76.8)
RDW: 15.7 % — ABNORMAL HIGH (ref 11.2–14.5)

## 2009-04-30 LAB — CBC WITH DIFFERENTIAL/PLATELET
BASO%: 0 % (ref 0.0–2.0)
EOS%: 2.3 % (ref 0.0–7.0)
Eosinophils Absolute: 0 10*3/uL (ref 0.0–0.5)
LYMPH%: 64.9 % — ABNORMAL HIGH (ref 14.0–49.7)
MCHC: 34.8 g/dL (ref 31.5–36.0)
MCV: 84.1 fL (ref 79.5–101.0)
MONO%: 12.2 % (ref 0.0–14.0)
NEUT#: 0.3 10*3/uL — CL (ref 1.5–6.5)
Platelets: 35 10*3/uL — ABNORMAL LOW (ref 145–400)
RBC: 2.9 10*6/uL — ABNORMAL LOW (ref 3.70–5.45)
RDW: 16.3 % — ABNORMAL HIGH (ref 11.2–14.5)

## 2009-05-02 ENCOUNTER — Ambulatory Visit: Payer: Self-pay | Admitting: Oncology

## 2009-05-04 LAB — CBC WITH DIFFERENTIAL/PLATELET
BASO%: 1.1 % (ref 0.0–2.0)
Eosinophils Absolute: 0 10*3/uL (ref 0.0–0.5)
MCHC: 36.2 g/dL — ABNORMAL HIGH (ref 31.5–36.0)
MONO#: 0.1 10*3/uL (ref 0.1–0.9)
NEUT#: 0.3 10*3/uL — CL (ref 1.5–6.5)
Platelets: 40 10*3/uL — ABNORMAL LOW (ref 145–400)
RBC: 2.66 10*6/uL — ABNORMAL LOW (ref 3.70–5.45)
RDW: 14.4 % (ref 11.2–14.5)
WBC: 1.3 10*3/uL — ABNORMAL LOW (ref 3.9–10.3)
lymph#: 0.8 10*3/uL — ABNORMAL LOW (ref 0.9–3.3)

## 2009-05-04 LAB — HOLD TUBE, BLOOD BANK

## 2009-05-07 LAB — CBC WITH DIFFERENTIAL/PLATELET
Basophils Absolute: 0 10*3/uL (ref 0.0–0.1)
Eosinophils Absolute: 0 10*3/uL (ref 0.0–0.5)
HGB: 7.3 g/dL — ABNORMAL LOW (ref 11.6–15.9)
MONO#: 0.1 10*3/uL (ref 0.1–0.9)
MONO%: 9.5 % (ref 0.0–14.0)
NEUT#: 0.2 10*3/uL — CL (ref 1.5–6.5)
RBC: 2.5 10*6/uL — ABNORMAL LOW (ref 3.70–5.45)
RDW: 18.4 % — ABNORMAL HIGH (ref 11.2–14.5)
WBC: 1.2 10*3/uL — ABNORMAL LOW (ref 3.9–10.3)
lymph#: 0.8 10*3/uL — ABNORMAL LOW (ref 0.9–3.3)

## 2009-05-07 LAB — CYTOMEGALOVIRUS PCR, QUALITATIVE: Cytomegalovirus DNA: NOT DETECTED

## 2009-05-11 LAB — COMPREHENSIVE METABOLIC PANEL
Alkaline Phosphatase: 131 U/L — ABNORMAL HIGH (ref 39–117)
BUN: 16 mg/dL (ref 6–23)
Creatinine, Ser: 0.92 mg/dL (ref 0.40–1.20)
Glucose, Bld: 110 mg/dL — ABNORMAL HIGH (ref 70–99)
Total Bilirubin: 0.6 mg/dL (ref 0.3–1.2)

## 2009-05-11 LAB — CBC WITH DIFFERENTIAL/PLATELET
BASO%: 0 % (ref 0.0–2.0)
EOS%: 1 % (ref 0.0–7.0)
HCT: 27.4 % — ABNORMAL LOW (ref 34.8–46.6)
LYMPH%: 69 % — ABNORMAL HIGH (ref 14.0–49.7)
MCH: 29.6 pg (ref 25.1–34.0)
MCHC: 35 g/dL (ref 31.5–36.0)
MONO#: 0.1 10*3/uL (ref 0.1–0.9)
NEUT%: 20 % — ABNORMAL LOW (ref 38.4–76.8)
RBC: 3.24 10*6/uL — ABNORMAL LOW (ref 3.70–5.45)
WBC: 1 10*3/uL — ABNORMAL LOW (ref 3.9–10.3)
lymph#: 0.7 10*3/uL — ABNORMAL LOW (ref 0.9–3.3)
nRBC: 0 % (ref 0–0)

## 2009-05-11 LAB — CHCC SMEAR

## 2009-05-11 LAB — MORPHOLOGY

## 2009-05-14 LAB — CBC WITH DIFFERENTIAL/PLATELET
Eosinophils Absolute: 0 10*3/uL (ref 0.0–0.5)
HCT: 26.6 % — ABNORMAL LOW (ref 34.8–46.6)
HGB: 9.1 g/dL — ABNORMAL LOW (ref 11.6–15.9)
LYMPH%: 61 % — ABNORMAL HIGH (ref 14.0–49.7)
MONO#: 0.2 10*3/uL (ref 0.1–0.9)
NEUT#: 0.3 10*3/uL — CL (ref 1.5–6.5)
Platelets: 31 10*3/uL — ABNORMAL LOW (ref 145–400)
RBC: 3.08 10*6/uL — ABNORMAL LOW (ref 3.70–5.45)
WBC: 1.2 10*3/uL — ABNORMAL LOW (ref 3.9–10.3)

## 2009-05-21 LAB — CBC WITH DIFFERENTIAL/PLATELET
BASO%: 1.6 % (ref 0.0–2.0)
Eosinophils Absolute: 0 10*3/uL (ref 0.0–0.5)
HCT: 24.3 % — ABNORMAL LOW (ref 34.8–46.6)
LYMPH%: 58.1 % — ABNORMAL HIGH (ref 14.0–49.7)
MCHC: 35.7 g/dL (ref 31.5–36.0)
MONO#: 0.1 10*3/uL (ref 0.1–0.9)
NEUT%: 26.5 % — ABNORMAL LOW (ref 38.4–76.8)
Platelets: 37 10*3/uL — ABNORMAL LOW (ref 145–400)
WBC: 1.1 10*3/uL — ABNORMAL LOW (ref 3.9–10.3)

## 2009-05-28 LAB — CBC WITH DIFFERENTIAL/PLATELET
Basophils Absolute: 0 10*3/uL (ref 0.0–0.1)
EOS%: 0.8 % (ref 0.0–7.0)
Eosinophils Absolute: 0 10*3/uL (ref 0.0–0.5)
HCT: 22.5 % — ABNORMAL LOW (ref 34.8–46.6)
HGB: 7.9 g/dL — ABNORMAL LOW (ref 11.6–15.9)
MCH: 30.7 pg (ref 25.1–34.0)
MONO#: 0.2 10*3/uL (ref 0.1–0.9)
NEUT%: 25.4 % — ABNORMAL LOW (ref 38.4–76.8)
lymph#: 0.7 10*3/uL — ABNORMAL LOW (ref 0.9–3.3)

## 2009-05-28 LAB — TYPE & CROSSMATCH - CHCC

## 2009-05-30 DIAGNOSIS — I639 Cerebral infarction, unspecified: Secondary | ICD-10-CM

## 2009-05-30 HISTORY — DX: Cerebral infarction, unspecified: I63.9

## 2009-05-31 ENCOUNTER — Ambulatory Visit: Payer: Self-pay | Admitting: Oncology

## 2009-06-05 ENCOUNTER — Encounter: Admission: RE | Admit: 2009-06-05 | Discharge: 2009-06-05 | Payer: Self-pay | Admitting: Oncology

## 2009-06-05 LAB — CBC WITH DIFFERENTIAL/PLATELET
Basophils Absolute: 0 10*3/uL (ref 0.0–0.1)
EOS%: 0.7 % (ref 0.0–7.0)
HCT: 29 % — ABNORMAL LOW (ref 34.8–46.6)
HGB: 10.1 g/dL — ABNORMAL LOW (ref 11.6–15.9)
MCH: 30.4 pg (ref 25.1–34.0)
MCV: 87.3 fL (ref 79.5–101.0)
MONO%: 12.4 % (ref 0.0–14.0)
NEUT%: 22.7 % — ABNORMAL LOW (ref 38.4–76.8)
Platelets: 36 10*3/uL — ABNORMAL LOW (ref 145–400)

## 2009-06-11 LAB — TECHNOLOGIST REVIEW

## 2009-06-11 LAB — CBC WITH DIFFERENTIAL/PLATELET
BASO%: 0.8 % (ref 0.0–2.0)
Eosinophils Absolute: 0 10*3/uL (ref 0.0–0.5)
HCT: 23.5 % — ABNORMAL LOW (ref 34.8–46.6)
LYMPH%: 50.8 % — ABNORMAL HIGH (ref 14.0–49.7)
MCHC: 36.2 g/dL — ABNORMAL HIGH (ref 31.5–36.0)
MCV: 89.8 fL (ref 79.5–101.0)
MONO%: 22.1 % — ABNORMAL HIGH (ref 0.0–14.0)
NEUT%: 25.8 % — ABNORMAL LOW (ref 38.4–76.8)
Platelets: 32 10*3/uL — ABNORMAL LOW (ref 145–400)
RBC: 2.61 10*6/uL — ABNORMAL LOW (ref 3.70–5.45)

## 2009-06-11 LAB — COMPREHENSIVE METABOLIC PANEL
ALT: 39 U/L — ABNORMAL HIGH (ref 0–35)
BUN: 17 mg/dL (ref 6–23)
CO2: 23 mEq/L (ref 19–32)
Calcium: 8.6 mg/dL (ref 8.4–10.5)
Creatinine, Ser: 0.98 mg/dL (ref 0.40–1.20)
Total Bilirubin: 0.7 mg/dL (ref 0.3–1.2)

## 2009-06-11 LAB — LACTATE DEHYDROGENASE: LDH: 288 U/L — ABNORMAL HIGH (ref 94–250)

## 2009-06-13 ENCOUNTER — Encounter: Admission: RE | Admit: 2009-06-13 | Discharge: 2009-06-13 | Payer: Self-pay | Admitting: Oncology

## 2009-06-18 LAB — TYPE & CROSSMATCH - CHCC

## 2009-06-18 LAB — CBC WITH DIFFERENTIAL/PLATELET
EOS%: 0.8 % (ref 0.0–7.0)
LYMPH%: 61.1 % — ABNORMAL HIGH (ref 14.0–49.7)
MCH: 30.4 pg (ref 25.1–34.0)
MCHC: 34.2 g/dL (ref 31.5–36.0)
MCV: 88.7 fL (ref 79.5–101.0)
MONO%: 16.7 % — ABNORMAL HIGH (ref 0.0–14.0)
RBC: 2.57 10*6/uL — ABNORMAL LOW (ref 3.70–5.45)
RDW: 21.1 % — ABNORMAL HIGH (ref 11.2–14.5)
nRBC: 2 % — ABNORMAL HIGH (ref 0–0)

## 2009-06-22 ENCOUNTER — Ambulatory Visit: Payer: Self-pay | Admitting: Cardiology

## 2009-06-22 ENCOUNTER — Ambulatory Visit: Payer: Self-pay | Admitting: Oncology

## 2009-06-22 ENCOUNTER — Inpatient Hospital Stay (HOSPITAL_COMMUNITY): Admission: EM | Admit: 2009-06-22 | Discharge: 2009-07-03 | Payer: Self-pay | Admitting: Emergency Medicine

## 2009-06-22 LAB — CBC WITH DIFFERENTIAL/PLATELET
BASO%: 0 % (ref 0.0–2.0)
EOS%: 0.8 % (ref 0.0–7.0)
MCH: 30.6 pg (ref 25.1–34.0)
MCHC: 35 g/dL (ref 31.5–36.0)
MCV: 87.3 fL (ref 79.5–101.0)
MONO%: 11.8 % (ref 0.0–14.0)
NEUT%: 22.7 % — ABNORMAL LOW (ref 38.4–76.8)
RDW: 18.6 % — ABNORMAL HIGH (ref 11.2–14.5)
lymph#: 0.8 10*3/uL — ABNORMAL LOW (ref 0.9–3.3)

## 2009-06-23 ENCOUNTER — Ambulatory Visit: Payer: Self-pay | Admitting: Oncology

## 2009-06-25 ENCOUNTER — Encounter (INDEPENDENT_AMBULATORY_CARE_PROVIDER_SITE_OTHER): Payer: Self-pay | Admitting: Internal Medicine

## 2009-06-27 ENCOUNTER — Ambulatory Visit: Payer: Self-pay | Admitting: Physical Medicine & Rehabilitation

## 2009-06-28 ENCOUNTER — Ambulatory Visit: Payer: Self-pay | Admitting: Oncology

## 2009-07-09 LAB — CBC WITH DIFFERENTIAL/PLATELET
BASO%: 0.6 % (ref 0.0–2.0)
EOS%: 1.7 % (ref 0.0–7.0)
HCT: 30.2 % — ABNORMAL LOW (ref 34.8–46.6)
MCH: 31.7 pg (ref 25.1–34.0)
MCHC: 34.4 g/dL (ref 31.5–36.0)
MCV: 92.1 fL (ref 79.5–101.0)
MONO%: 17.3 % — ABNORMAL HIGH (ref 0.0–14.0)
NEUT%: 37 % — ABNORMAL LOW (ref 38.4–76.8)
lymph#: 0.8 10*3/uL — ABNORMAL LOW (ref 0.9–3.3)

## 2009-07-09 LAB — TECHNOLOGIST REVIEW

## 2009-07-16 LAB — CBC WITH DIFFERENTIAL/PLATELET
Basophils Absolute: 0 10*3/uL (ref 0.0–0.1)
EOS%: 1.8 % (ref 0.0–7.0)
HCT: 29.9 % — ABNORMAL LOW (ref 34.8–46.6)
HGB: 10.2 g/dL — ABNORMAL LOW (ref 11.6–15.9)
LYMPH%: 52.7 % — ABNORMAL HIGH (ref 14.0–49.7)
MCH: 31.5 pg (ref 25.1–34.0)
MCV: 92.3 fL (ref 79.5–101.0)
NEUT%: 36 % — ABNORMAL LOW (ref 38.4–76.8)
Platelets: 40 10*3/uL — ABNORMAL LOW (ref 145–400)
lymph#: 1.2 10*3/uL (ref 0.9–3.3)

## 2009-07-19 ENCOUNTER — Encounter: Payer: Self-pay | Admitting: Oncology

## 2009-07-19 ENCOUNTER — Other Ambulatory Visit: Admission: RE | Admit: 2009-07-19 | Discharge: 2009-07-19 | Payer: Self-pay | Admitting: Oncology

## 2009-07-23 ENCOUNTER — Ambulatory Visit: Payer: Self-pay | Admitting: Oncology

## 2009-07-23 LAB — CBC WITH DIFFERENTIAL/PLATELET
Basophils Absolute: 0 10*3/uL (ref 0.0–0.1)
EOS%: 4.2 % (ref 0.0–7.0)
Eosinophils Absolute: 0 10*3/uL (ref 0.0–0.5)
HGB: 8.5 g/dL — ABNORMAL LOW (ref 11.6–15.9)
LYMPH%: 46 % (ref 14.0–49.7)
MCH: 33.9 pg (ref 25.1–34.0)
MCV: 96.2 fL (ref 79.5–101.0)
MONO%: 6.5 % (ref 0.0–14.0)
NEUT#: 0.5 10*3/uL — ABNORMAL LOW (ref 1.5–6.5)
Platelets: 45 10*3/uL — ABNORMAL LOW (ref 145–400)
RBC: 2.51 10*6/uL — ABNORMAL LOW (ref 3.70–5.45)

## 2009-07-27 LAB — CBC WITH DIFFERENTIAL/PLATELET
BASO%: 1.3 % (ref 0.0–2.0)
EOS%: 3.6 % (ref 0.0–7.0)
LYMPH%: 58.8 % — ABNORMAL HIGH (ref 14.0–49.7)
MCHC: 35.4 g/dL (ref 31.5–36.0)
MCV: 96.9 fL (ref 79.5–101.0)
MONO%: 7.6 % (ref 0.0–14.0)
Platelets: 59 10*3/uL — ABNORMAL LOW (ref 145–400)
RBC: 2.46 10*6/uL — ABNORMAL LOW (ref 3.70–5.45)

## 2009-07-27 LAB — HOLD TUBE, BLOOD BANK

## 2009-07-30 LAB — CBC WITH DIFFERENTIAL/PLATELET
Eosinophils Absolute: 0 10*3/uL (ref 0.0–0.5)
HGB: 8.5 g/dL — ABNORMAL LOW (ref 11.6–15.9)
LYMPH%: 60.4 % — ABNORMAL HIGH (ref 14.0–49.7)
MCH: 34.5 pg — ABNORMAL HIGH (ref 25.1–34.0)
MONO#: 0.1 10*3/uL (ref 0.1–0.9)
MONO%: 7.6 % (ref 0.0–14.0)
NEUT#: 0.4 10*3/uL — CL (ref 1.5–6.5)
NEUT%: 28.2 % — ABNORMAL LOW (ref 38.4–76.8)
RBC: 2.46 10*6/uL — ABNORMAL LOW (ref 3.70–5.45)
RDW: 24.6 % — ABNORMAL HIGH (ref 11.2–14.5)

## 2009-08-06 ENCOUNTER — Encounter (HOSPITAL_COMMUNITY): Admission: RE | Admit: 2009-08-06 | Discharge: 2009-09-28 | Payer: Self-pay | Admitting: Oncology

## 2009-08-06 LAB — CBC WITH DIFFERENTIAL/PLATELET
Basophils Absolute: 0 10*3/uL (ref 0.0–0.1)
EOS%: 2.4 % (ref 0.0–7.0)
HCT: 22.8 % — ABNORMAL LOW (ref 34.8–46.6)
HGB: 8.1 g/dL — ABNORMAL LOW (ref 11.6–15.9)
LYMPH%: 44.6 % (ref 14.0–49.7)
MCH: 35.5 pg — ABNORMAL HIGH (ref 25.1–34.0)
MCHC: 35.7 g/dL (ref 31.5–36.0)
MCV: 99.5 fL (ref 79.5–101.0)
MONO%: 13.7 % (ref 0.0–14.0)
NEUT%: 38.8 % (ref 38.4–76.8)

## 2009-08-06 LAB — COMPREHENSIVE METABOLIC PANEL
ALT: 25 U/L (ref 0–35)
AST: 23 U/L (ref 0–37)
Albumin: 4.1 g/dL (ref 3.5–5.2)
Calcium: 9.4 mg/dL (ref 8.4–10.5)
Chloride: 108 mEq/L (ref 96–112)
Creatinine, Ser: 0.93 mg/dL (ref 0.40–1.20)
Potassium: 4.1 mEq/L (ref 3.5–5.3)
Sodium: 140 mEq/L (ref 135–145)
Total Protein: 6.3 g/dL (ref 6.0–8.3)

## 2009-08-06 LAB — MORPHOLOGY: PLT EST: DECREASED

## 2009-08-06 LAB — URIC ACID: Uric Acid, Serum: 4.9 mg/dL (ref 2.4–7.0)

## 2009-08-07 LAB — TYPE & CROSSMATCH - CHCC

## 2009-08-17 ENCOUNTER — Ambulatory Visit: Payer: Self-pay | Admitting: Oncology

## 2009-08-20 LAB — MORPHOLOGY: PLT EST: DECREASED

## 2009-08-20 LAB — CBC WITH DIFFERENTIAL/PLATELET
Basophils Absolute: 0 10*3/uL (ref 0.0–0.1)
EOS%: 2.2 % (ref 0.0–7.0)
Eosinophils Absolute: 0 10*3/uL (ref 0.0–0.5)
HGB: 9.9 g/dL — ABNORMAL LOW (ref 11.6–15.9)
NEUT#: 0.4 10*3/uL — CL (ref 1.5–6.5)
RDW: 24 % — ABNORMAL HIGH (ref 11.2–14.5)
lymph#: 1 10*3/uL (ref 0.9–3.3)

## 2009-08-31 ENCOUNTER — Ambulatory Visit (HOSPITAL_COMMUNITY): Admission: RE | Admit: 2009-08-31 | Discharge: 2009-08-31 | Payer: Self-pay | Admitting: Oncology

## 2009-08-31 ENCOUNTER — Encounter: Payer: Self-pay | Admitting: Oncology

## 2009-09-03 LAB — MORPHOLOGY: PLT EST: DECREASED

## 2009-09-03 LAB — CBC WITH DIFFERENTIAL/PLATELET
Eosinophils Absolute: 0 10*3/uL (ref 0.0–0.5)
LYMPH%: 53.5 % — ABNORMAL HIGH (ref 14.0–49.7)
MONO#: 0.2 10*3/uL (ref 0.1–0.9)
NEUT#: 0.7 10*3/uL — ABNORMAL LOW (ref 1.5–6.5)
Platelets: 46 10*3/uL — ABNORMAL LOW (ref 145–400)
RBC: 2.7 10*6/uL — ABNORMAL LOW (ref 3.70–5.45)
RDW: 21.3 % — ABNORMAL HIGH (ref 11.2–14.5)
WBC: 2.2 10*3/uL — ABNORMAL LOW (ref 3.9–10.3)

## 2009-09-07 LAB — CBC WITH DIFFERENTIAL/PLATELET
BASO%: 0.6 % (ref 0.0–2.0)
Eosinophils Absolute: 0 10*3/uL (ref 0.0–0.5)
HCT: 23.7 % — ABNORMAL LOW (ref 34.8–46.6)
MCHC: 35.1 g/dL (ref 31.5–36.0)
MONO#: 0.3 10*3/uL (ref 0.1–0.9)
NEUT#: 0.6 10*3/uL — ABNORMAL LOW (ref 1.5–6.5)
NEUT%: 40.2 % (ref 38.4–76.8)
WBC: 1.6 10*3/uL — ABNORMAL LOW (ref 3.9–10.3)
lymph#: 0.7 10*3/uL — ABNORMAL LOW (ref 0.9–3.3)

## 2009-09-12 ENCOUNTER — Ambulatory Visit: Payer: Self-pay | Admitting: Oncology

## 2009-09-17 LAB — CBC & DIFF AND RETIC
EOS%: 2.2 % (ref 0.0–7.0)
MCH: 34.7 pg — ABNORMAL HIGH (ref 25.1–34.0)
MCV: 100 fL (ref 79.5–101.0)
MONO%: 13.4 % (ref 0.0–14.0)
NEUT#: 0.4 10*3/uL — CL (ref 1.5–6.5)
RBC: 2.39 10*6/uL — ABNORMAL LOW (ref 3.70–5.45)
RDW: 21.8 % — ABNORMAL HIGH (ref 11.2–14.5)
Retic %: 2.9 % — ABNORMAL HIGH (ref 0.50–1.50)
Retic Ct Abs: 69.31 10*3/uL (ref 18.30–72.70)

## 2009-09-17 LAB — MORPHOLOGY: PLT EST: DECREASED

## 2009-09-17 LAB — COMPREHENSIVE METABOLIC PANEL
AST: 30 U/L (ref 0–37)
Alkaline Phosphatase: 142 U/L — ABNORMAL HIGH (ref 39–117)
BUN: 17 mg/dL (ref 6–23)
Creatinine, Ser: 0.9 mg/dL (ref 0.40–1.20)
Total Bilirubin: 0.6 mg/dL (ref 0.3–1.2)

## 2009-10-01 LAB — CBC WITH DIFFERENTIAL/PLATELET
BASO%: 0.4 % (ref 0.0–2.0)
Basophils Absolute: 0 10*3/uL (ref 0.0–0.1)
EOS%: 2.2 % (ref 0.0–7.0)
Eosinophils Absolute: 0.1 10*3/uL (ref 0.0–0.5)
HCT: 22.9 % — ABNORMAL LOW (ref 34.8–46.6)
HGB: 7.8 g/dL — ABNORMAL LOW (ref 11.6–15.9)
LYMPH%: 49.3 % (ref 14.0–49.7)
MCH: 35.8 pg — ABNORMAL HIGH (ref 25.1–34.0)
MCHC: 34.1 g/dL (ref 31.5–36.0)
MCV: 105 fL — ABNORMAL HIGH (ref 79.5–101.0)
MONO#: 0.4 10*3/uL (ref 0.1–0.9)
MONO%: 15.7 % — ABNORMAL HIGH (ref 0.0–14.0)
NEUT#: 0.9 10*3/uL — ABNORMAL LOW (ref 1.5–6.5)
NEUT%: 32.4 % — ABNORMAL LOW (ref 38.4–76.8)
Platelets: 44 10*3/uL — ABNORMAL LOW (ref 145–400)
RBC: 2.18 10*6/uL — ABNORMAL LOW (ref 3.70–5.45)
RDW: 22.3 % — ABNORMAL HIGH (ref 11.2–14.5)
WBC: 2.7 10*3/uL — ABNORMAL LOW (ref 3.9–10.3)
lymph#: 1.3 10*3/uL (ref 0.9–3.3)
nRBC: 1 % — ABNORMAL HIGH (ref 0–0)

## 2009-10-08 ENCOUNTER — Ambulatory Visit: Payer: Self-pay | Admitting: Oncology

## 2009-10-08 LAB — CBC WITH DIFFERENTIAL/PLATELET
Basophils Absolute: 0 10*3/uL (ref 0.0–0.1)
Eosinophils Absolute: 0.1 10*3/uL (ref 0.0–0.5)
LYMPH%: 41.2 % (ref 14.0–49.7)
MCV: 107.3 fL — ABNORMAL HIGH (ref 79.5–101.0)
MONO%: 18.9 % — ABNORMAL HIGH (ref 0.0–14.0)
NEUT#: 0.9 10*3/uL — ABNORMAL LOW (ref 1.5–6.5)
Platelets: 43 10*3/uL — ABNORMAL LOW (ref 145–400)
RBC: 2.18 10*6/uL — ABNORMAL LOW (ref 3.70–5.45)
nRBC: 2 % — ABNORMAL HIGH (ref 0–0)

## 2009-10-15 ENCOUNTER — Encounter (HOSPITAL_COMMUNITY): Admission: RE | Admit: 2009-10-15 | Discharge: 2010-01-10 | Payer: Self-pay | Admitting: Oncology

## 2009-10-15 LAB — CBC WITH DIFFERENTIAL/PLATELET
BASO%: 0 % (ref 0.0–2.0)
EOS%: 4.5 % (ref 0.0–7.0)
HCT: 22.2 % — ABNORMAL LOW (ref 34.8–46.6)
LYMPH%: 44.9 % (ref 14.0–49.7)
MCH: 36.9 pg — ABNORMAL HIGH (ref 25.1–34.0)
MCHC: 33.8 g/dL (ref 31.5–36.0)
MONO%: 16.6 % — ABNORMAL HIGH (ref 0.0–14.0)
NEUT%: 34 % — ABNORMAL LOW (ref 38.4–76.8)
Platelets: 48 10*3/uL — ABNORMAL LOW (ref 145–400)

## 2009-10-16 LAB — TYPE & CROSSMATCH - CHCC

## 2009-10-22 LAB — CBC WITH DIFFERENTIAL/PLATELET
BASO%: 0.4 % (ref 0.0–2.0)
EOS%: 4.2 % (ref 0.0–7.0)
HCT: 31.9 % — ABNORMAL LOW (ref 34.8–46.6)
LYMPH%: 44.2 % (ref 14.0–49.7)
MCH: 35.7 pg — ABNORMAL HIGH (ref 25.1–34.0)
MCHC: 34.5 g/dL (ref 31.5–36.0)
NEUT%: 35 % — ABNORMAL LOW (ref 38.4–76.8)
Platelets: 49 10*3/uL — ABNORMAL LOW (ref 145–400)

## 2009-10-29 LAB — CBC WITH DIFFERENTIAL/PLATELET
Basophils Absolute: 0 10*3/uL (ref 0.0–0.1)
EOS%: 3.1 % (ref 0.0–7.0)
HGB: 10.6 g/dL — ABNORMAL LOW (ref 11.6–15.9)
LYMPH%: 37.1 % (ref 14.0–49.7)
MCH: 35.6 pg — ABNORMAL HIGH (ref 25.1–34.0)
MCV: 103 fL — ABNORMAL HIGH (ref 79.5–101.0)
MONO%: 17.8 % — ABNORMAL HIGH (ref 0.0–14.0)
RBC: 2.98 10*6/uL — ABNORMAL LOW (ref 3.70–5.45)
RDW: 20.9 % — ABNORMAL HIGH (ref 11.2–14.5)

## 2009-10-30 HISTORY — PX: BREAST EXCISIONAL BIOPSY: SUR124

## 2009-11-05 LAB — CBC WITH DIFFERENTIAL/PLATELET
Basophils Absolute: 0 10*3/uL (ref 0.0–0.1)
EOS%: 2.3 % (ref 0.0–7.0)
Eosinophils Absolute: 0.1 10*3/uL (ref 0.0–0.5)
HCT: 29.7 % — ABNORMAL LOW (ref 34.8–46.6)
HGB: 10.2 g/dL — ABNORMAL LOW (ref 11.6–15.9)
MCH: 35.3 pg — ABNORMAL HIGH (ref 25.1–34.0)
MCV: 102.8 fL — ABNORMAL HIGH (ref 79.5–101.0)
MONO%: 18.3 % — ABNORMAL HIGH (ref 0.0–14.0)
NEUT%: 21.7 % — ABNORMAL LOW (ref 38.4–76.8)
Platelets: 55 10*3/uL — ABNORMAL LOW (ref 145–400)

## 2009-11-12 ENCOUNTER — Encounter (INDEPENDENT_AMBULATORY_CARE_PROVIDER_SITE_OTHER): Payer: Self-pay | Admitting: General Surgery

## 2009-11-12 ENCOUNTER — Ambulatory Visit (HOSPITAL_COMMUNITY): Admission: RE | Admit: 2009-11-12 | Discharge: 2009-11-13 | Payer: Self-pay | Admitting: General Surgery

## 2009-11-12 ENCOUNTER — Ambulatory Visit: Payer: Self-pay | Admitting: Oncology

## 2009-11-12 ENCOUNTER — Encounter: Admission: RE | Admit: 2009-11-12 | Discharge: 2009-11-12 | Payer: Self-pay | Admitting: General Surgery

## 2009-11-12 LAB — COMPREHENSIVE METABOLIC PANEL
ALT: 42 U/L — ABNORMAL HIGH (ref 0–35)
AST: 27 U/L (ref 0–37)
Albumin: 4.3 g/dL (ref 3.5–5.2)
Alkaline Phosphatase: 130 U/L — ABNORMAL HIGH (ref 39–117)
Glucose, Bld: 109 mg/dL — ABNORMAL HIGH (ref 70–99)
Potassium: 4.1 mEq/L (ref 3.5–5.3)
Sodium: 139 mEq/L (ref 135–145)
Total Bilirubin: 0.5 mg/dL (ref 0.3–1.2)
Total Protein: 6.8 g/dL (ref 6.0–8.3)

## 2009-11-12 LAB — CBC WITH DIFFERENTIAL/PLATELET
Basophils Absolute: 0 10*3/uL (ref 0.0–0.1)
EOS%: 5.1 % (ref 0.0–7.0)
Eosinophils Absolute: 0.1 10*3/uL (ref 0.0–0.5)
HGB: 9.7 g/dL — ABNORMAL LOW (ref 11.6–15.9)
MCH: 35.4 pg — ABNORMAL HIGH (ref 25.1–34.0)
MONO%: 21.8 % — ABNORMAL HIGH (ref 0.0–14.0)
NEUT#: 0.7 10*3/uL — ABNORMAL LOW (ref 1.5–6.5)
RBC: 2.74 10*6/uL — ABNORMAL LOW (ref 3.70–5.45)
RDW: 20.4 % — ABNORMAL HIGH (ref 11.2–14.5)
lymph#: 0.8 10*3/uL — ABNORMAL LOW (ref 0.9–3.3)
nRBC: 0 % (ref 0–0)

## 2009-11-12 LAB — MORPHOLOGY: PLT EST: DECREASED

## 2009-11-19 LAB — CBC WITH DIFFERENTIAL/PLATELET
BASO%: 0.4 % (ref 0.0–2.0)
EOS%: 2.6 % (ref 0.0–7.0)
HCT: 28.7 % — ABNORMAL LOW (ref 34.8–46.6)
LYMPH%: 44.4 % (ref 14.0–49.7)
MCH: 35.8 pg — ABNORMAL HIGH (ref 25.1–34.0)
MCHC: 34.1 g/dL (ref 31.5–36.0)
MCV: 104.7 fL — ABNORMAL HIGH (ref 79.5–101.0)
MONO#: 0.5 10*3/uL (ref 0.1–0.9)
MONO%: 18.9 % — ABNORMAL HIGH (ref 0.0–14.0)
NEUT%: 33.7 % — ABNORMAL LOW (ref 38.4–76.8)
Platelets: 61 10*3/uL — ABNORMAL LOW (ref 145–400)
RBC: 2.74 10*6/uL — ABNORMAL LOW (ref 3.70–5.45)
WBC: 2.7 10*3/uL — ABNORMAL LOW (ref 3.9–10.3)
nRBC: 1 % — ABNORMAL HIGH (ref 0–0)

## 2009-11-26 ENCOUNTER — Ambulatory Visit: Admission: RE | Admit: 2009-11-26 | Discharge: 2009-12-13 | Payer: Self-pay | Admitting: Radiation Oncology

## 2009-11-28 LAB — CBC WITH DIFFERENTIAL/PLATELET
BASO%: 0.5 % (ref 0.0–2.0)
EOS%: 4.6 % (ref 0.0–7.0)
HCT: 28.7 % — ABNORMAL LOW (ref 34.8–46.6)
LYMPH%: 53 % — ABNORMAL HIGH (ref 14.0–49.7)
MCH: 36 pg — ABNORMAL HIGH (ref 25.1–34.0)
MCHC: 34.5 g/dL (ref 31.5–36.0)
MCV: 104.4 fL — ABNORMAL HIGH (ref 79.5–101.0)
MONO%: 21.5 % — ABNORMAL HIGH (ref 0.0–14.0)
NEUT%: 20.4 % — ABNORMAL LOW (ref 38.4–76.8)
Platelets: 66 10*3/uL — ABNORMAL LOW (ref 145–400)
RBC: 2.75 10*6/uL — ABNORMAL LOW (ref 3.70–5.45)
WBC: 2.2 10*3/uL — ABNORMAL LOW (ref 3.9–10.3)
nRBC: 0 % (ref 0–0)

## 2009-11-28 LAB — MORPHOLOGY: PLT EST: DECREASED

## 2009-12-07 ENCOUNTER — Encounter: Admission: RE | Admit: 2009-12-07 | Discharge: 2009-12-07 | Payer: Self-pay | Admitting: Oncology

## 2009-12-24 ENCOUNTER — Ambulatory Visit: Payer: Self-pay | Admitting: Oncology

## 2009-12-26 LAB — COMPREHENSIVE METABOLIC PANEL
ALT: 35 U/L (ref 0–35)
AST: 34 U/L (ref 0–37)
CO2: 21 mEq/L (ref 19–32)
Calcium: 9.6 mg/dL (ref 8.4–10.5)
Chloride: 106 mEq/L (ref 96–112)
Creatinine, Ser: 0.91 mg/dL (ref 0.40–1.20)
Potassium: 4.2 mEq/L (ref 3.5–5.3)
Sodium: 139 mEq/L (ref 135–145)
Total Protein: 6.7 g/dL (ref 6.0–8.3)

## 2009-12-26 LAB — CBC WITH DIFFERENTIAL/PLATELET
Basophils Absolute: 0 10*3/uL (ref 0.0–0.1)
EOS%: 3.7 % (ref 0.0–7.0)
Eosinophils Absolute: 0.1 10*3/uL (ref 0.0–0.5)
LYMPH%: 35 % (ref 14.0–49.7)
MCH: 40.3 pg — ABNORMAL HIGH (ref 25.1–34.0)
MCV: 113 fL — ABNORMAL HIGH (ref 79.5–101.0)
MONO%: 15.4 % — ABNORMAL HIGH (ref 0.0–14.0)
NEUT#: 1.2 10*3/uL — ABNORMAL LOW (ref 1.5–6.5)
Platelets: 79 10*3/uL — ABNORMAL LOW (ref 145–400)
RBC: 2.39 10*6/uL — ABNORMAL LOW (ref 3.70–5.45)

## 2009-12-26 LAB — MORPHOLOGY

## 2010-01-23 ENCOUNTER — Ambulatory Visit: Payer: Self-pay | Admitting: Oncology

## 2010-01-23 LAB — COMPREHENSIVE METABOLIC PANEL
AST: 27 U/L (ref 0–37)
Albumin: 4.1 g/dL (ref 3.5–5.2)
Alkaline Phosphatase: 109 U/L (ref 39–117)
BUN: 20 mg/dL (ref 6–23)
Calcium: 9.5 mg/dL (ref 8.4–10.5)
Chloride: 107 mEq/L (ref 96–112)
Potassium: 3.8 mEq/L (ref 3.5–5.3)
Sodium: 143 mEq/L (ref 135–145)
Total Protein: 6.1 g/dL (ref 6.0–8.3)

## 2010-01-23 LAB — CBC WITH DIFFERENTIAL/PLATELET
BASO%: 0 % (ref 0.0–2.0)
Basophils Absolute: 0 10*3/uL (ref 0.0–0.1)
EOS%: 5.5 % (ref 0.0–7.0)
HCT: 29.6 % — ABNORMAL LOW (ref 34.8–46.6)
HGB: 10 g/dL — ABNORMAL LOW (ref 11.6–15.9)
MCHC: 33.8 g/dL (ref 31.5–36.0)
MONO#: 0.6 10*3/uL (ref 0.1–0.9)
NEUT%: 17 % — ABNORMAL LOW (ref 38.4–76.8)
RDW: 16.2 % — ABNORMAL HIGH (ref 11.2–14.5)
WBC: 2.4 10*3/uL — ABNORMAL LOW (ref 3.9–10.3)
lymph#: 1.2 10*3/uL (ref 0.9–3.3)

## 2010-02-18 LAB — CBC WITH DIFFERENTIAL/PLATELET
Basophils Absolute: 0 10*3/uL (ref 0.0–0.1)
EOS%: 4.9 % (ref 0.0–7.0)
MCH: 41.6 pg — ABNORMAL HIGH (ref 25.1–34.0)
MCHC: 36 g/dL (ref 31.5–36.0)
MCV: 115.7 fL — ABNORMAL HIGH (ref 79.5–101.0)
MONO%: 14.8 % — ABNORMAL HIGH (ref 0.0–14.0)
RBC: 2.56 10*6/uL — ABNORMAL LOW (ref 3.70–5.45)
RDW: 15.1 % — ABNORMAL HIGH (ref 11.2–14.5)

## 2010-02-18 LAB — COMPREHENSIVE METABOLIC PANEL
Albumin: 4 g/dL (ref 3.5–5.2)
Alkaline Phosphatase: 108 U/L (ref 39–117)
BUN: 14 mg/dL (ref 6–23)
Calcium: 9.7 mg/dL (ref 8.4–10.5)
Glucose, Bld: 116 mg/dL — ABNORMAL HIGH (ref 70–99)
Potassium: 3.8 mEq/L (ref 3.5–5.3)

## 2010-02-18 LAB — MORPHOLOGY

## 2010-02-18 LAB — URIC ACID: Uric Acid, Serum: 5.6 mg/dL (ref 2.4–7.0)

## 2010-03-15 ENCOUNTER — Ambulatory Visit: Payer: Self-pay | Admitting: Oncology

## 2010-03-19 LAB — CBC WITH DIFFERENTIAL/PLATELET
BASO%: 0.5 % (ref 0.0–2.0)
EOS%: 4.4 % (ref 0.0–7.0)
HCT: 31 % — ABNORMAL LOW (ref 34.8–46.6)
LYMPH%: 48.8 % (ref 14.0–49.7)
MCH: 39.3 pg — ABNORMAL HIGH (ref 25.1–34.0)
MCHC: 34.5 g/dL (ref 31.5–36.0)
MONO%: 18.1 % — ABNORMAL HIGH (ref 0.0–14.0)
NEUT%: 28.2 % — ABNORMAL LOW (ref 38.4–76.8)
Platelets: 82 10*3/uL — ABNORMAL LOW (ref 145–400)

## 2010-03-19 LAB — COMPREHENSIVE METABOLIC PANEL
ALT: 31 U/L (ref 0–35)
AST: 28 U/L (ref 0–37)
Chloride: 107 mEq/L (ref 96–112)
Creatinine, Ser: 1.12 mg/dL (ref 0.40–1.20)
Sodium: 139 mEq/L (ref 135–145)
Total Bilirubin: 0.5 mg/dL (ref 0.3–1.2)

## 2010-03-19 LAB — LACTATE DEHYDROGENASE: LDH: 272 U/L — ABNORMAL HIGH (ref 94–250)

## 2010-03-19 LAB — URIC ACID: Uric Acid, Serum: 6.6 mg/dL (ref 2.4–7.0)

## 2010-04-16 ENCOUNTER — Ambulatory Visit: Payer: Self-pay | Admitting: Oncology

## 2010-04-16 LAB — MORPHOLOGY

## 2010-04-16 LAB — CBC WITH DIFFERENTIAL/PLATELET
Basophils Absolute: 0 10*3/uL (ref 0.0–0.1)
EOS%: 4.7 % (ref 0.0–7.0)
HCT: 31.6 % — ABNORMAL LOW (ref 34.8–46.6)
HGB: 11 g/dL — ABNORMAL LOW (ref 11.6–15.9)
LYMPH%: 49.7 % (ref 14.0–49.7)
MCH: 39 pg — ABNORMAL HIGH (ref 25.1–34.0)
NEUT%: 27.4 % — ABNORMAL LOW (ref 38.4–76.8)
Platelets: 86 10*3/uL — ABNORMAL LOW (ref 145–400)
lymph#: 1.6 10*3/uL (ref 0.9–3.3)

## 2010-05-13 ENCOUNTER — Ambulatory Visit (HOSPITAL_COMMUNITY): Admission: RE | Admit: 2010-05-13 | Discharge: 2010-05-13 | Payer: Self-pay | Admitting: Oncology

## 2010-05-13 LAB — BASIC METABOLIC PANEL
CO2: 29 mEq/L (ref 19–32)
Calcium: 9.5 mg/dL (ref 8.4–10.5)
Chloride: 111 mEq/L (ref 96–112)
Glucose, Bld: 107 mg/dL — ABNORMAL HIGH (ref 70–99)
Potassium: 4 mEq/L (ref 3.5–5.3)
Sodium: 145 mEq/L (ref 135–145)

## 2010-05-13 LAB — CBC WITH DIFFERENTIAL/PLATELET
BASO%: 0.4 % (ref 0.0–2.0)
EOS%: 7.5 % — ABNORMAL HIGH (ref 0.0–7.0)
LYMPH%: 48.6 % (ref 14.0–49.7)
MCHC: 35.3 g/dL (ref 31.5–36.0)
MCV: 116.4 fL — ABNORMAL HIGH (ref 79.5–101.0)
MONO%: 18.7 % — ABNORMAL HIGH (ref 0.0–14.0)
NEUT#: 0.7 10*3/uL — ABNORMAL LOW (ref 1.5–6.5)
Platelets: 97 10*3/uL — ABNORMAL LOW (ref 145–400)
RBC: 2.5 10*6/uL — ABNORMAL LOW (ref 3.70–5.45)
RDW: 14.9 % — ABNORMAL HIGH (ref 11.2–14.5)

## 2010-05-13 LAB — MORPHOLOGY

## 2010-06-13 ENCOUNTER — Ambulatory Visit: Payer: Self-pay | Admitting: Vascular Surgery

## 2010-06-13 ENCOUNTER — Encounter (INDEPENDENT_AMBULATORY_CARE_PROVIDER_SITE_OTHER): Payer: Self-pay | Admitting: Internal Medicine

## 2010-06-13 ENCOUNTER — Ambulatory Visit: Payer: Self-pay | Admitting: Internal Medicine

## 2010-06-20 ENCOUNTER — Ambulatory Visit: Payer: Self-pay | Admitting: Oncology

## 2010-06-24 LAB — COMPREHENSIVE METABOLIC PANEL
AST: 44 U/L — ABNORMAL HIGH (ref 0–37)
Albumin: 4.4 g/dL (ref 3.5–5.2)
Alkaline Phosphatase: 116 U/L (ref 39–117)
BUN: 16 mg/dL (ref 6–23)
Calcium: 9.3 mg/dL (ref 8.4–10.5)
Chloride: 106 mEq/L (ref 96–112)
Glucose, Bld: 85 mg/dL (ref 70–99)
Potassium: 4 mEq/L (ref 3.5–5.3)
Sodium: 142 mEq/L (ref 135–145)
Total Protein: 6.6 g/dL (ref 6.0–8.3)

## 2010-06-24 LAB — CBC WITH DIFFERENTIAL/PLATELET
Basophils Absolute: 0 10*3/uL (ref 0.0–0.1)
EOS%: 6.3 % (ref 0.0–7.0)
Eosinophils Absolute: 0.2 10*3/uL (ref 0.0–0.5)
HGB: 11.2 g/dL — ABNORMAL LOW (ref 11.6–15.9)
MONO%: 18.2 % — ABNORMAL HIGH (ref 0.0–14.0)
NEUT#: 0.9 10*3/uL — ABNORMAL LOW (ref 1.5–6.5)
RBC: 2.74 10*6/uL — ABNORMAL LOW (ref 3.70–5.45)
RDW: 15.3 % — ABNORMAL HIGH (ref 11.2–14.5)
lymph#: 1.9 10*3/uL (ref 0.9–3.3)

## 2010-06-26 ENCOUNTER — Encounter: Admission: RE | Admit: 2010-06-26 | Discharge: 2010-06-26 | Payer: Self-pay | Admitting: Oncology

## 2010-08-21 ENCOUNTER — Ambulatory Visit: Payer: Self-pay | Admitting: Oncology

## 2010-08-26 LAB — CBC WITH DIFFERENTIAL/PLATELET
BASO%: 1.4 % (ref 0.0–2.0)
Basophils Absolute: 0.1 10*3/uL (ref 0.0–0.1)
EOS%: 3.6 % (ref 0.0–7.0)
HGB: 11.2 g/dL — ABNORMAL LOW (ref 11.6–15.9)
MCH: 40.3 pg — ABNORMAL HIGH (ref 25.1–34.0)
MCHC: 34.8 g/dL (ref 31.5–36.0)
MCV: 115.9 fL — ABNORMAL HIGH (ref 79.5–101.0)
MONO%: 16 % — ABNORMAL HIGH (ref 0.0–14.0)
RDW: 14.7 % — ABNORMAL HIGH (ref 11.2–14.5)
lymph#: 1.9 10*3/uL (ref 0.9–3.3)

## 2010-08-26 LAB — MORPHOLOGY

## 2010-09-05 ENCOUNTER — Inpatient Hospital Stay (HOSPITAL_COMMUNITY): Admission: EM | Admit: 2010-09-05 | Discharge: 2010-06-15 | Payer: Self-pay | Admitting: Emergency Medicine

## 2010-09-29 HISTORY — PX: MASTECTOMY W/ SENTINEL NODE BIOPSY: SHX2001

## 2010-10-09 ENCOUNTER — Inpatient Hospital Stay (HOSPITAL_COMMUNITY)
Admission: RE | Admit: 2010-10-09 | Discharge: 2010-10-11 | Payer: Self-pay | Source: Home / Self Care | Attending: General Surgery | Admitting: General Surgery

## 2010-10-09 ENCOUNTER — Encounter (INDEPENDENT_AMBULATORY_CARE_PROVIDER_SITE_OTHER): Payer: Self-pay | Admitting: General Surgery

## 2010-10-14 LAB — DIFFERENTIAL
Band Neutrophils: 0 % (ref 0–10)
Basophils Absolute: 0 10*3/uL (ref 0.0–0.1)
Basophils Relative: 1 % (ref 0–1)
Blasts: 0 %
Eosinophils Absolute: 0.1 10*3/uL (ref 0.0–0.7)
Eosinophils Relative: 3 % (ref 0–5)
Lymphocytes Relative: 44 % (ref 12–46)
Lymphs Abs: 1.7 10*3/uL (ref 0.7–4.0)
Metamyelocytes Relative: 0 %
Monocytes Absolute: 0.5 10*3/uL (ref 0.1–1.0)
Monocytes Relative: 12 % (ref 3–12)
Myelocytes: 0 %
Neutro Abs: 1.6 10*3/uL — ABNORMAL LOW (ref 1.7–7.7)
Neutrophils Relative %: 40 % — ABNORMAL LOW (ref 43–77)
Promyelocytes Absolute: 0 %
nRBC: 0 /100 WBC

## 2010-10-14 LAB — URINALYSIS, ROUTINE W REFLEX MICROSCOPIC
Bilirubin Urine: NEGATIVE
Hgb urine dipstick: NEGATIVE
Ketones, ur: NEGATIVE mg/dL
Nitrite: NEGATIVE
Protein, ur: NEGATIVE mg/dL
Specific Gravity, Urine: 1.017 (ref 1.005–1.030)
Urine Glucose, Fasting: NEGATIVE mg/dL
Urobilinogen, UA: 1 mg/dL (ref 0.0–1.0)
pH: 7 (ref 5.0–8.0)

## 2010-10-14 LAB — COMPREHENSIVE METABOLIC PANEL
ALT: 40 U/L — ABNORMAL HIGH (ref 0–35)
AST: 34 U/L (ref 0–37)
Albumin: 4.1 g/dL (ref 3.5–5.2)
Alkaline Phosphatase: 105 U/L (ref 39–117)
BUN: 11 mg/dL (ref 6–23)
CO2: 29 mEq/L (ref 19–32)
Calcium: 9.8 mg/dL (ref 8.4–10.5)
Chloride: 105 mEq/L (ref 96–112)
Creatinine, Ser: 1.12 mg/dL (ref 0.4–1.2)
GFR calc Af Amer: 58 mL/min — ABNORMAL LOW (ref >60)
GFR calc non Af Amer: 48 mL/min — ABNORMAL LOW (ref >60)
Glucose, Bld: 82 mg/dL (ref 70–99)
Potassium: 4.7 mEq/L (ref 3.5–5.1)
Sodium: 141 mEq/L (ref 135–145)
Total Bilirubin: 0.6 mg/dL (ref 0.3–1.2)
Total Protein: 6.5 g/dL (ref 6.0–8.3)

## 2010-10-14 LAB — URINE MICROSCOPIC-ADD ON

## 2010-10-14 LAB — CBC
HCT: 32.8 % — ABNORMAL LOW (ref 36.0–46.0)
HCT: 28.8 % — ABNORMAL LOW (ref 36.0–46.0)
Hemoglobin: 11.5 g/dL — ABNORMAL LOW (ref 12.0–15.0)
Hemoglobin: 10 g/dL — ABNORMAL LOW (ref 12.0–15.0)
MCH: 39.1 pg — ABNORMAL HIGH (ref 26.0–34.0)
MCH: 38.6 pg — ABNORMAL HIGH (ref 26.0–34.0)
MCHC: 35.1 g/dL (ref 30.0–36.0)
MCHC: 34.7 g/dL (ref 30.0–36.0)
MCV: 111.6 fL — ABNORMAL HIGH (ref 78.0–100.0)
MCV: 111.2 fL — ABNORMAL HIGH (ref 78.0–100.0)
Platelets: 97 10*3/uL — ABNORMAL LOW (ref 150–400)
Platelets: 84 10*3/uL — ABNORMAL LOW (ref 150–400)
RBC: 2.94 MIL/uL — ABNORMAL LOW (ref 3.87–5.11)
RBC: 2.59 MIL/uL — ABNORMAL LOW (ref 3.87–5.11)
RDW: 13 % (ref 11.5–15.5)
RDW: 13 % (ref 11.5–15.5)
WBC: 3.9 10*3/uL — ABNORMAL LOW (ref 4.0–10.5)
WBC: 4.6 10*3/uL (ref 4.0–10.5)

## 2010-10-14 LAB — SURGICAL PCR SCREEN
MRSA, PCR: NEGATIVE
Staphylococcus aureus: NEGATIVE

## 2010-10-18 NOTE — Op Note (Signed)
[name], [name]                  ACCOUNT NO.:  [id number]  MEDICAL RECORD NO.:  [id number]          PATIENT TYPE:  INP  LOCATION:  5127                         FACILITY:  [facility]  PHYSICIAN:  [name], M.D.DATE OF BIRTH:  [birth year]  DATE OF PROCEDURE:  10/09/2010 DATE OF DISCHARGE:                              OPERATIVE REPORT   PREOPERATIVE DIAGNOSIS:  Ductal carcinoma in situ of the left breast.  POSTOPERATIVE DIAGNOSIS:  Ductal carcinoma in situ of the left breast.  SURGICAL PROCEDURES: 1. Blue dye injection, left breast. 2. Left total mastectomy. 3. Left axillary sentinel lymph node biopsy.  SURGEON:  [name]. Olivia Amadon, MD  ANESTHESIA:  General.  BRIEF HISTORY:  [name] is a 77 year old female who approximately a year ago underwent an extensive excisional biopsy for calcifications in the left breast.  This showed extensive DCIS with positive margins.  The patient at that time had been recently diagnosed with leukemia and had persistent low platelet counts and uncertain prognosis.  After discussion of options, we elected that time to treat her with hormonal therapy and observation.  She has done very well with her leukemia diagnosis with improved counts.  She has also developed a complex cystic mass in the lateral breast on imaging and physical exam.  On followup and with these developments, we have elected at this point to proceed with left total mastectomy with sentinel lymph node biopsy.  The indications for the procedure, its nature, anesthetic risks, risks of bleeding, infection, wound healing problems were discussed and understood by the patient preoperatively.  She is now brought to operating room for this procedure.  DESCRIPTION OF OPERATION:  The patient was brought to operating room, placed in supine position on the operating table and general orotracheal anesthesia was induced.  About 45 minutes prior to the procedure, 1 mCi of technetium  sulfur colloid was injected intradermally around the left nipple.  After confirming patient procedure under sterile technique, I injected 5 mL of dilute methylene blue in the subcutaneous tissue underneath the lateral to the nipple and massaged for several minutes. The entire left chest, upper arm were widely sterilely prepped and draped.  An elliptical incision was used encompassing the central breast skin and nipple-areolar complex.  Skin and subcutaneous flaps were then raised superiorly up to the clavicle medially to the edge of the sternum, inferiorly at the origin of the rectus, laterally out to the anterior border of the latissimus dorsi.  The axilla was exposed and then the NeoProbe was used to identify a definite hot area of the left axilla.  Dissection was carried down onto a node which contained blue dye and had counts of about 5000.  After excision, background in the axilla was less than 200.  This was sent as hot blue sentinel lymph node for touch prep and did return as negative for cancer cells.  Following dissection of the flaps, the breast tissue was dissected up off the anterior chest wall with cautery, reflecting off of the pectoralis major and serratus and dissecting out laterally off the anterior border of the latissimus.  Dissection was carried  up toward the axilla as I came across the low axilla between clamps.  A few slightly enlarged lymph nodes in the low axilla were occluded with the specimen.  Clamps were tied with Vicryl.  Following this, the wound was thoroughly irrigated with saline and complete hemostasis obtained.  A JP drain was left beneath the flaps, brought out through a lateral stab wound.  Skin was then closed with staples.  Sponge, needle, and instrument counts were correct.  Dry sterile dressings were applied. The patient was taken to recovery in good condition.     [name]. [name], M.D.     [name]  D:  10/09/2010  T:  10/10/2010   Job:  [id number]  cc:   [name], M.D.  Electronically Signed by [name] M.D. on 10/18/2010 09:15:02 AM

## 2010-10-20 ENCOUNTER — Encounter: Payer: Self-pay | Admitting: General Surgery

## 2010-10-24 ENCOUNTER — Ambulatory Visit: Payer: Self-pay | Admitting: Oncology

## 2010-10-28 LAB — COMPREHENSIVE METABOLIC PANEL
Albumin: 4.5 g/dL (ref 3.5–5.2)
BUN: 16 mg/dL (ref 6–23)
CO2: 26 mEq/L (ref 19–32)
Calcium: 9.8 mg/dL (ref 8.4–10.5)
Chloride: 105 mEq/L (ref 96–112)
Creatinine, Ser: 0.99 mg/dL (ref 0.40–1.20)
Glucose, Bld: 115 mg/dL — ABNORMAL HIGH (ref 70–99)
Potassium: 3.7 mEq/L (ref 3.5–5.3)

## 2010-10-28 LAB — CBC WITH DIFFERENTIAL/PLATELET
BASO%: 0.3 % (ref 0.0–2.0)
EOS%: 4.3 % (ref 0.0–7.0)
Eosinophils Absolute: 0.2 10*3/uL (ref 0.0–0.5)
MCH: 39.7 pg — ABNORMAL HIGH (ref 25.1–34.0)
MCHC: 34.7 g/dL (ref 31.5–36.0)
MCV: 114.5 fL — ABNORMAL HIGH (ref 79.5–101.0)
MONO%: 14.1 % — ABNORMAL HIGH (ref 0.0–14.0)
NEUT#: 1.2 10*3/uL — ABNORMAL LOW (ref 1.5–6.5)
RBC: 2.85 10*6/uL — ABNORMAL LOW (ref 3.70–5.45)
RDW: 14.8 % — ABNORMAL HIGH (ref 11.2–14.5)

## 2010-10-28 LAB — MORPHOLOGY

## 2010-10-28 LAB — LACTATE DEHYDROGENASE: LDH: 164 U/L (ref 94–250)

## 2010-10-29 NOTE — Discharge Summary (Signed)
  [name]                  ACCOUNT NO.:  [id number]  MEDICAL RECORD NO.:  [id number]          PATIENT TYPE:  INP  LOCATION:  5127                         FACILITY:  [facility]  PHYSICIAN:  Sharlet Salina T. Rozalyn Osland, M.D.DATE OF BIRTH:  1934/07/06  DATE OF ADMISSION:  10/09/2010 DATE OF DISCHARGE:  10/11/2010                              DISCHARGE SUMMARY   DISCHARGE DIAGNOSIS:  Cancer of the left breast.  SURGICAL PROCEDURES:  Left total mastectomy with axillary sentinel lymph node biopsy on October 09, 2010.  HISTORY OF PRESENT ILLNESS:  [name] is a 77 year old female followed by Dr. Cyndie Chime for chronic leukemia.  She has history of needle- localized left breast lumpectomy for abnormal calcifications and biopsy showing DCIS.  This was performed about a year ago.  Biopsy showed extensive DCIS with positive margins.  However, the patient was having active difficulty with her leukemia that time and after extensive discussion of options, we elected to treat her with Tamoxifen only and follow.  Over the last year she has stabilized in regards to her chronic lymphoma and on followup of her breast, she has developed a small nodule in the outer part of the breast.  Due to change in physical exam and overall stabilization of her medical condition, we have elected now to proceed with total mastectomy with sentinel lymph node biopsy.  She is admitted for this procedure.  PAST MEDICAL HISTORY:  Surgery includes remote hysterectomy and parotid tumor removal.  She is followed for chronic lymphoma by Dr. Cyndie Chime.  MEDICATIONS:  Multiple related to her lymphoma.  See Med Rec form.  PERTINENT PHYSICAL EXAM:  She is an elderly African American female in no acute distress.  Pertinent findings were limited to lymph node showing no cervical, supraclavicular, or axillary nodes palpable and breast showing small amount of bloody discharge from the left nipple and in the upper outer quadrant  of the left breast at the 1 o'clock position about a 2 cm freely movable mass, superficial.  HOSPITAL COURSE:  The patient was admitted on the morning of procedure and underwent an uneventful left total mastectomy with sentinel lymph node biopsy.  Final pathology revealed sentinel lymph node negative. There were, however, multiple foci of invasive ductal carcinoma grade 2/3 within the breast, largest focus being 2.6 cm with negative margins. The patient tolerated procedure well.  There was no evidence of bleeding or other complication.  She felt a little weak and sore to go home on the first postoperative day and was observed another 24 hours.  At this point her wound is healing well.  JP drains serosanguineous without evidence of bleeding.  Discharge medications are the same as admission plus Vicodin as needed for pain.  Followup is to be in my office in 1 week.     Lorne Skeens. Danyel Tobey, M.D.     Tory Emerald  D:  10/28/2010  T:  10/29/2010  Job:  [id number]  cc:   Genene Churn. Cyndie Chime, M.D.  Electronically Signed by Glenna Fellows M.D. on 10/29/2010 09:18:24 AM

## 2010-12-10 ENCOUNTER — Encounter (HOSPITAL_BASED_OUTPATIENT_CLINIC_OR_DEPARTMENT_OTHER): Payer: Medicare Other | Admitting: Oncology

## 2010-12-10 ENCOUNTER — Encounter: Payer: Medicare Other | Admitting: Oncology

## 2010-12-10 ENCOUNTER — Other Ambulatory Visit: Payer: Self-pay | Admitting: Oncology

## 2010-12-10 DIAGNOSIS — D0590 Unspecified type of carcinoma in situ of unspecified breast: Secondary | ICD-10-CM

## 2010-12-10 DIAGNOSIS — C8599 Non-Hodgkin lymphoma, unspecified, extranodal and solid organ sites: Secondary | ICD-10-CM

## 2010-12-10 DIAGNOSIS — C9110 Chronic lymphocytic leukemia of B-cell type not having achieved remission: Secondary | ICD-10-CM

## 2010-12-10 DIAGNOSIS — Z5111 Encounter for antineoplastic chemotherapy: Secondary | ICD-10-CM

## 2010-12-10 LAB — MORPHOLOGY: PLT EST: DECREASED

## 2010-12-10 LAB — CBC WITH DIFFERENTIAL/PLATELET
Basophils Absolute: 0 10*3/uL (ref 0.0–0.1)
EOS%: 4.4 % (ref 0.0–7.0)
Eosinophils Absolute: 0.2 10*3/uL (ref 0.0–0.5)
HGB: 11.4 g/dL — ABNORMAL LOW (ref 11.6–15.9)
LYMPH%: 54.3 % — ABNORMAL HIGH (ref 14.0–49.7)
MCH: 39.1 pg — ABNORMAL HIGH (ref 25.1–34.0)
MCV: 112.3 fL — ABNORMAL HIGH (ref 79.5–101.0)
MONO%: 13.6 % (ref 0.0–14.0)
NEUT#: 1 10*3/uL — ABNORMAL LOW (ref 1.5–6.5)
Platelets: 83 10*3/uL — ABNORMAL LOW (ref 145–400)
RBC: 2.92 10*6/uL — ABNORMAL LOW (ref 3.70–5.45)

## 2010-12-10 LAB — COMPREHENSIVE METABOLIC PANEL
BUN: 18 mg/dL (ref 6–23)
CO2: 25 mEq/L (ref 19–32)
Creatinine, Ser: 0.89 mg/dL (ref 0.40–1.20)
Glucose, Bld: 111 mg/dL — ABNORMAL HIGH (ref 70–99)
Total Bilirubin: 0.6 mg/dL (ref 0.3–1.2)

## 2010-12-10 LAB — VITAMIN D 25 HYDROXY (VIT D DEFICIENCY, FRACTURES): Vit D, 25-Hydroxy: <10 ng/mL — ABNORMAL LOW (ref 30–89)

## 2010-12-10 LAB — LACTATE DEHYDROGENASE: LDH: 255 U/L — ABNORMAL HIGH (ref 94–250)

## 2010-12-12 LAB — CBC
HCT: 28.5 % — ABNORMAL LOW (ref 36.0–46.0)
HCT: 31.3 % — ABNORMAL LOW (ref 36.0–46.0)
Hemoglobin: 10 g/dL — ABNORMAL LOW (ref 12.0–15.0)
Hemoglobin: 10.5 g/dL — ABNORMAL LOW (ref 12.0–15.0)
Hemoglobin: 9.9 g/dL — ABNORMAL LOW (ref 12.0–15.0)
Hemoglobin: 11 g/dL — ABNORMAL LOW (ref 12.0–15.0)
MCH: 40.5 pg — ABNORMAL HIGH (ref 26.0–34.0)
MCH: 40.9 pg — ABNORMAL HIGH (ref 26.0–34.0)
MCH: 41.2 pg — ABNORMAL HIGH (ref 26.0–34.0)
MCHC: 35 g/dL (ref 30.0–36.0)
MCHC: 35.2 g/dL (ref 30.0–36.0)
MCHC: 35.3 g/dL (ref 30.0–36.0)
MCV: 115.9 fL — ABNORMAL HIGH (ref 78.0–100.0)
MCV: 116.7 fL — ABNORMAL HIGH (ref 78.0–100.0)
Platelets: 100 10*3/uL — ABNORMAL LOW (ref 150–400)
Platelets: 96 10*3/uL — ABNORMAL LOW (ref 150–400)
Platelets: 107 10*3/uL — ABNORMAL LOW (ref 150–400)
RBC: 2.42 MIL/uL — ABNORMAL LOW (ref 3.87–5.11)
RBC: 2.46 MIL/uL — ABNORMAL LOW (ref 3.87–5.11)
RBC: 2.57 MIL/uL — ABNORMAL LOW (ref 3.87–5.11)
RBC: 2.68 MIL/uL — ABNORMAL LOW (ref 3.87–5.11)
RDW: 15.2 % (ref 11.5–15.5)
RDW: 15.5 % (ref 11.5–15.5)
WBC: 2.4 10*3/uL — ABNORMAL LOW (ref 4.0–10.5)
WBC: 2.4 10*3/uL — ABNORMAL LOW (ref 4.0–10.5)

## 2010-12-12 LAB — COMPREHENSIVE METABOLIC PANEL
ALT: 42 U/L — ABNORMAL HIGH (ref 0–35)
AST: 33 U/L (ref 0–37)
Albumin: 3.7 g/dL (ref 3.5–5.2)
Alkaline Phosphatase: 93 U/L (ref 39–117)
BUN: 17 mg/dL (ref 6–23)
Chloride: 109 mEq/L (ref 96–112)
Potassium: 4.3 mEq/L (ref 3.5–5.1)
Sodium: 141 mEq/L (ref 135–145)
Total Bilirubin: 0.7 mg/dL (ref 0.3–1.2)
Total Protein: 6 g/dL (ref 6.0–8.3)

## 2010-12-12 LAB — COMPREHENSIVE METABOLIC PANEL WITH GFR
ALT: 51 U/L — ABNORMAL HIGH (ref 0–35)
AST: 38 U/L — ABNORMAL HIGH (ref 0–37)
Albumin: 3.7 g/dL (ref 3.5–5.2)
Alkaline Phosphatase: 92 U/L (ref 39–117)
BUN: 14 mg/dL (ref 6–23)
CO2: 26 meq/L (ref 19–32)
Calcium: 9.4 mg/dL (ref 8.4–10.5)
Chloride: 110 meq/L (ref 96–112)
Creatinine, Ser: 1.04 mg/dL (ref 0.4–1.2)
GFR calc non Af Amer: 52 mL/min — ABNORMAL LOW
Glucose, Bld: 99 mg/dL (ref 70–99)
Potassium: 3.9 meq/L (ref 3.5–5.1)
Sodium: 142 meq/L (ref 135–145)
Total Bilirubin: 0.6 mg/dL (ref 0.3–1.2)
Total Protein: 5.9 g/dL — ABNORMAL LOW (ref 6.0–8.3)

## 2010-12-12 LAB — BASIC METABOLIC PANEL WITH GFR
BUN: 16 mg/dL (ref 6–23)
CO2: 28 meq/L (ref 19–32)
Calcium: 9.5 mg/dL (ref 8.4–10.5)
Chloride: 108 meq/L (ref 96–112)
Creatinine, Ser: 1.05 mg/dL (ref 0.4–1.2)
GFR calc non Af Amer: 51 mL/min — ABNORMAL LOW
Glucose, Bld: 105 mg/dL — ABNORMAL HIGH (ref 70–99)
Potassium: 4.3 meq/L (ref 3.5–5.1)
Sodium: 141 meq/L (ref 135–145)

## 2010-12-12 LAB — HEMOGLOBIN A1C
Hgb A1c MFr Bld: 5.5 %
Mean Plasma Glucose: 111 mg/dL

## 2010-12-12 LAB — TROPONIN I

## 2010-12-12 LAB — POCT I-STAT, CHEM 8
Calcium, Ion: 1.24 mmol/L (ref 1.12–1.32)
Chloride: 107 mEq/L (ref 96–112)
Glucose, Bld: 104 mg/dL — ABNORMAL HIGH (ref 70–99)
Hemoglobin: 11.2 g/dL — ABNORMAL LOW (ref 12.0–15.0)
TCO2: 27 mmol/L (ref 0–100)

## 2010-12-12 LAB — TSH: TSH: 1.219 u[IU]/mL (ref 0.350–4.500)

## 2010-12-12 LAB — CK TOTAL AND CKMB (NOT AT ARMC)
Relative Index: INVALID (ref 0.0–2.5)
Total CK: 55 U/L (ref 7–177)

## 2010-12-12 LAB — DIFFERENTIAL
Basophils Absolute: 0 10*3/uL (ref 0.0–0.1)
Basophils Relative: 1 % (ref 0–1)
Eosinophils Absolute: 0.2 10*3/uL (ref 0.0–0.7)
Eosinophils Absolute: 0.3 10*3/uL (ref 0.0–0.7)
Lymphs Abs: 1.4 10*3/uL (ref 0.7–4.0)
Lymphs Abs: 1.5 10*3/uL (ref 0.7–4.0)
Monocytes Absolute: 0.4 10*3/uL (ref 0.1–1.0)
Monocytes Absolute: 0.5 10*3/uL (ref 0.1–1.0)
Monocytes Relative: 21 % — ABNORMAL HIGH (ref 3–12)
Neutro Abs: 0.1 10*3/uL — ABNORMAL LOW (ref 1.7–7.7)
Neutro Abs: 0.1 10*3/uL — ABNORMAL LOW (ref 1.7–7.7)
Neutrophils Relative %: 5 % — ABNORMAL LOW (ref 43–77)

## 2010-12-12 LAB — PROTIME-INR
INR: 1.15 (ref 0.00–1.49)
Prothrombin Time: 14.9 seconds (ref 11.6–15.2)

## 2010-12-12 LAB — LIPID PANEL: Cholesterol: 167 mg/dL (ref 0–200)

## 2010-12-12 LAB — MAGNESIUM: Magnesium: 1.9 mg/dL (ref 1.5–2.5)

## 2010-12-12 LAB — HOMOCYSTEINE: Homocysteine: 12 umol/L (ref 4.0–15.4)

## 2010-12-12 LAB — POCT CARDIAC MARKERS
CKMB, poc: <1 ng/mL — ABNORMAL LOW (ref 1.0–8.0)
Myoglobin, poc: 69.6 ng/mL (ref 12–200)
Troponin i, poc: <0.05 ng/mL (ref 0.00–0.09)

## 2010-12-12 LAB — CARDIAC PANEL(CRET KIN+CKTOT+MB+TROPI)
CK, MB: 0.9 ng/mL (ref 0.3–4.0)
Relative Index: INVALID (ref 0.0–2.5)
Relative Index: INVALID (ref 0.0–2.5)
Total CK: 53 U/L (ref 7–177)
Total CK: 54 U/L (ref 7–177)
Troponin I: 0.02 ng/mL (ref 0.00–0.06)

## 2010-12-12 LAB — URINALYSIS, ROUTINE W REFLEX MICROSCOPIC
Nitrite: NEGATIVE
Specific Gravity, Urine: 1.01 (ref 1.005–1.030)
Urobilinogen, UA: 1 mg/dL (ref 0.0–1.0)

## 2010-12-12 LAB — APTT: aPTT: 37 seconds (ref 24–37)

## 2010-12-12 LAB — D-DIMER, QUANTITATIVE

## 2010-12-15 LAB — CROSSMATCH
ABO/RH(D): A POS
Antibody Screen: NEGATIVE

## 2010-12-18 LAB — DIFFERENTIAL
Basophils Relative: 0 % (ref 0–1)
Eosinophils Relative: 4 % (ref 0–5)
Lymphocytes Relative: 55 % — ABNORMAL HIGH (ref 12–46)
Neutro Abs: 0.4 10*3/uL — ABNORMAL LOW (ref 1.7–7.7)
Neutrophils Relative %: 18 % — ABNORMAL LOW (ref 43–77)
Smear Review: DECREASED

## 2010-12-18 LAB — COMPREHENSIVE METABOLIC PANEL
Albumin: 4.4 g/dL (ref 3.5–5.2)
BUN: 18 mg/dL (ref 6–23)
Calcium: 9.5 mg/dL (ref 8.4–10.5)
Creatinine, Ser: 1.03 mg/dL (ref 0.4–1.2)
Potassium: 4.1 mEq/L (ref 3.5–5.1)
Total Protein: 6.9 g/dL (ref 6.0–8.3)

## 2010-12-18 LAB — URINALYSIS, ROUTINE W REFLEX MICROSCOPIC
Protein, ur: NEGATIVE mg/dL
Specific Gravity, Urine: 1.019 (ref 1.005–1.030)
pH: 6.5 (ref 5.0–8.0)

## 2010-12-18 LAB — CBC
HCT: 28.8 % — ABNORMAL LOW (ref 36.0–46.0)
HCT: 30.9 % — ABNORMAL LOW (ref 36.0–46.0)
Hemoglobin: 10.3 g/dL — ABNORMAL LOW (ref 12.0–15.0)
MCHC: 35.6 g/dL (ref 30.0–36.0)
Platelets: 61 10*3/uL — ABNORMAL LOW (ref 150–400)
RBC: 2.7 MIL/uL — ABNORMAL LOW (ref 3.87–5.11)
RDW: 24.2 % — ABNORMAL HIGH (ref 11.5–15.5)
WBC: 2.3 10*3/uL — ABNORMAL LOW (ref 4.0–10.5)

## 2010-12-18 LAB — URINE MICROSCOPIC-ADD ON

## 2010-12-18 LAB — ABO/RH: ABO/RH(D): A POS

## 2010-12-18 LAB — PATHOLOGIST SMEAR REVIEW

## 2010-12-18 LAB — TYPE AND SCREEN: Antibody Screen: NEGATIVE

## 2010-12-31 LAB — CBC
Hemoglobin: 9.6 g/dL — ABNORMAL LOW (ref 12.0–15.0)
RBC: 2.71 MIL/uL — ABNORMAL LOW (ref 3.87–5.11)

## 2010-12-31 LAB — PROTIME-INR
INR: 1 (ref 0.00–1.49)
Prothrombin Time: 13.1 seconds (ref 11.6–15.2)

## 2010-12-31 LAB — APTT: aPTT: 34 seconds (ref 24–37)

## 2011-01-01 LAB — BASIC METABOLIC PANEL
BUN: 16 mg/dL (ref 4–21)
Creatinine: 0.9 mg/dL (ref 0.5–1.1)
Potassium: 3.9 mmol/L (ref 3.4–5.3)

## 2011-01-01 LAB — HEPATIC FUNCTION PANEL
ALT: 31 U/L (ref 7–35)
Alkaline Phosphatase: 117 U/L (ref 25–125)
Bilirubin, Total: 0.5 mg/dL

## 2011-01-01 LAB — CROSSMATCH
ABO/RH(D): A POS
Antibody Screen: NEGATIVE

## 2011-01-01 LAB — LIPID PANEL: Triglycerides: 124 mg/dL (ref 40–160)

## 2011-01-01 LAB — TSH: TSH: 1.52 u[IU]/mL (ref 0.41–5.90)

## 2011-01-02 LAB — COMPREHENSIVE METABOLIC PANEL
ALT: 46 U/L — ABNORMAL HIGH (ref 0–35)
AST: 31 U/L (ref 0–37)
AST: 39 U/L — ABNORMAL HIGH (ref 0–37)
Albumin: 3.6 g/dL (ref 3.5–5.2)
Alkaline Phosphatase: 156 U/L — ABNORMAL HIGH (ref 39–117)
BUN: 21 mg/dL (ref 6–23)
CO2: 26 mEq/L (ref 19–32)
Calcium: 9.2 mg/dL (ref 8.4–10.5)
Calcium: 9.2 mg/dL (ref 8.4–10.5)
Calcium: 9.2 mg/dL (ref 8.4–10.5)
Creatinine, Ser: 1.31 mg/dL — ABNORMAL HIGH (ref 0.4–1.2)
Creatinine, Ser: 1.33 mg/dL — ABNORMAL HIGH (ref 0.4–1.2)
GFR calc Af Amer: 48 mL/min — ABNORMAL LOW (ref >60)
GFR calc Af Amer: 52 mL/min — ABNORMAL LOW (ref >60)
GFR calc non Af Amer: 40 mL/min — ABNORMAL LOW (ref >60)
Glucose, Bld: 105 mg/dL — ABNORMAL HIGH (ref 70–99)
Glucose, Bld: 106 mg/dL — ABNORMAL HIGH (ref 70–99)
Glucose, Bld: 151 mg/dL — ABNORMAL HIGH (ref 70–99)
Potassium: 4.1 mEq/L (ref 3.5–5.1)
Sodium: 136 mEq/L (ref 135–145)
Total Protein: 6.3 g/dL (ref 6.0–8.3)
Total Protein: 6.4 g/dL (ref 6.0–8.3)
Total Protein: 6.4 g/dL (ref 6.0–8.3)

## 2011-01-02 LAB — DIFFERENTIAL
Basophils Absolute: 0 10*3/uL (ref 0.0–0.1)
Basophils Absolute: 0 10*3/uL (ref 0.0–0.1)
Basophils Relative: 0 % (ref 0–1)
Basophils Relative: 0 % (ref 0–1)
Basophils Relative: 1 % (ref 0–1)
Eosinophils Absolute: 0.1 10*3/uL (ref 0.0–0.7)
Eosinophils Absolute: 0 10*3/uL (ref 0.0–0.7)
Eosinophils Relative: 1 % (ref 0–5)
Eosinophils Relative: 1 % (ref 0–5)
Lymphocytes Relative: 41 % (ref 12–46)
Lymphocytes Relative: 27 % (ref 12–46)
Lymphocytes Relative: 30 % (ref 12–46)
Lymphocytes Relative: 41 % (ref 12–46)
Lymphs Abs: 0.7 10*3/uL (ref 0.7–4.0)
Lymphs Abs: 0.6 10*3/uL — ABNORMAL LOW (ref 0.7–4.0)
Monocytes Absolute: 0.1 10*3/uL (ref 0.1–1.0)
Monocytes Relative: 30 % — ABNORMAL HIGH (ref 3–12)
Monocytes Relative: 35 % — ABNORMAL HIGH (ref 3–12)
Neutro Abs: 0.7 10*3/uL — ABNORMAL LOW (ref 1.7–7.7)
Neutro Abs: 0.7 10*3/uL — ABNORMAL LOW (ref 1.7–7.7)
Neutro Abs: 0.8 10*3/uL — ABNORMAL LOW (ref 1.7–7.7)
Neutrophils Relative %: 28 % — ABNORMAL LOW (ref 43–77)
Neutrophils Relative %: 39 % — ABNORMAL LOW (ref 43–77)

## 2011-01-02 LAB — CBC
HCT: 26.2 % — ABNORMAL LOW (ref 36.0–46.0)
HCT: 27.6 % — ABNORMAL LOW (ref 36.0–46.0)
HCT: 30.6 % — ABNORMAL LOW (ref 36.0–46.0)
Hemoglobin: 9.1 g/dL — ABNORMAL LOW (ref 12.0–15.0)
Hemoglobin: 10.6 g/dL — ABNORMAL LOW (ref 12.0–15.0)
Hemoglobin: 10.6 g/dL — ABNORMAL LOW (ref 12.0–15.0)
MCHC: 34.8 g/dL (ref 30.0–36.0)
MCHC: 34.7 g/dL (ref 30.0–36.0)
MCHC: 35 g/dL (ref 30.0–36.0)
MCHC: 35.4 g/dL (ref 30.0–36.0)
MCV: 94 fL (ref 78.0–100.0)
MCV: 94.7 fL (ref 78.0–100.0)
Platelets: 39 10*3/uL — CL (ref 150–400)
Platelets: 39 10*3/uL — CL (ref 150–400)
RBC: 2.65 MIL/uL — ABNORMAL LOW (ref 3.87–5.11)
RBC: 3.18 MIL/uL — ABNORMAL LOW (ref 3.87–5.11)
RDW: 21.5 % — ABNORMAL HIGH (ref 11.5–15.5)
RDW: 20.7 % — ABNORMAL HIGH (ref 11.5–15.5)
RDW: 22.1 % — ABNORMAL HIGH (ref 11.5–15.5)
RDW: 22.2 % — ABNORMAL HIGH (ref 11.5–15.5)
RDW: 22.5 % — ABNORMAL HIGH (ref 11.5–15.5)
WBC: 2.6 10*3/uL — ABNORMAL LOW (ref 4.0–10.5)

## 2011-01-02 LAB — URINALYSIS, MICROSCOPIC ONLY
Nitrite: NEGATIVE
Specific Gravity, Urine: 1.02 (ref 1.005–1.030)
Urobilinogen, UA: 1 mg/dL (ref 0.0–1.0)
pH: 6 (ref 5.0–8.0)

## 2011-01-02 LAB — URINE CULTURE

## 2011-01-02 LAB — CROSSMATCH

## 2011-01-02 LAB — MAGNESIUM
Magnesium: 2.1 mg/dL (ref 1.5–2.5)
Magnesium: 2.2 mg/dL (ref 1.5–2.5)

## 2011-01-02 LAB — CULTURE, BLOOD (ROUTINE X 2): Culture: NO GROWTH

## 2011-01-03 LAB — CBC
HCT: 26.1 % — ABNORMAL LOW (ref 36.0–46.0)
HCT: 26.1 % — ABNORMAL LOW (ref 36.0–46.0)
HCT: 27.6 % — ABNORMAL LOW (ref 36.0–46.0)
Hemoglobin: 10.2 g/dL — ABNORMAL LOW (ref 12.0–15.0)
Hemoglobin: 9.8 g/dL — ABNORMAL LOW (ref 12.0–15.0)
MCHC: 34.8 g/dL (ref 30.0–36.0)
MCV: 91.3 fL (ref 78.0–100.0)
MCV: 91.8 fL (ref 78.0–100.0)
MCV: 92 fL (ref 78.0–100.0)
MCV: 92.6 fL (ref 78.0–100.0)
Platelets: 33 10*3/uL — CL (ref 150–400)
Platelets: 34 10*3/uL — CL (ref 150–400)
Platelets: 34 10*3/uL — CL (ref 150–400)
Platelets: 36 10*3/uL — CL (ref 150–400)
Platelets: 36 10*3/uL — CL (ref 150–400)
RBC: 2.84 MIL/uL — ABNORMAL LOW (ref 3.87–5.11)
RBC: 3.15 MIL/uL — ABNORMAL LOW (ref 3.87–5.11)
RDW: 20.2 % — ABNORMAL HIGH (ref 11.5–15.5)
RDW: 22.1 % — ABNORMAL HIGH (ref 11.5–15.5)
WBC: 1 10*3/uL — CL (ref 4.0–10.5)
WBC: 1 10*3/uL — CL (ref 4.0–10.5)
WBC: 1.4 10*3/uL — CL (ref 4.0–10.5)
WBC: 1.4 10*3/uL — CL (ref 4.0–10.5)
WBC: 2 10*3/uL — ABNORMAL LOW (ref 4.0–10.5)

## 2011-01-03 LAB — DIFFERENTIAL
Basophils Absolute: 0 10*3/uL (ref 0.0–0.1)
Basophils Absolute: 0 10*3/uL (ref 0.0–0.1)
Basophils Relative: 1 % (ref 0–1)
Eosinophils Absolute: 0 10*3/uL (ref 0.0–0.7)
Eosinophils Absolute: 0 10*3/uL (ref 0.0–0.7)
Eosinophils Relative: 1 % (ref 0–5)
Lymphs Abs: 0.5 10*3/uL — ABNORMAL LOW (ref 0.7–4.0)
Lymphs Abs: 0.6 10*3/uL — ABNORMAL LOW (ref 0.7–4.0)
Lymphs Abs: 1 10*3/uL (ref 0.7–4.0)
Monocytes Absolute: 0.2 10*3/uL (ref 0.1–1.0)
Monocytes Absolute: 0.2 10*3/uL (ref 0.1–1.0)
Monocytes Absolute: 0.5 10*3/uL (ref 0.1–1.0)
Monocytes Relative: 18 % — ABNORMAL HIGH (ref 3–12)
Monocytes Relative: 26 % — ABNORMAL HIGH (ref 3–12)
Neutro Abs: 0.2 10*3/uL — ABNORMAL LOW (ref 1.7–7.7)
Neutrophils Relative %: 23 % — ABNORMAL LOW (ref 43–77)

## 2011-01-03 LAB — COMPREHENSIVE METABOLIC PANEL
ALT: 39 U/L — ABNORMAL HIGH (ref 0–35)
AST: 31 U/L (ref 0–37)
AST: 32 U/L (ref 0–37)
AST: 34 U/L (ref 0–37)
Albumin: 3.6 g/dL (ref 3.5–5.2)
Alkaline Phosphatase: 149 U/L — ABNORMAL HIGH (ref 39–117)
CO2: 26 mEq/L (ref 19–32)
CO2: 27 mEq/L (ref 19–32)
Chloride: 103 mEq/L (ref 96–112)
Chloride: 103 mEq/L (ref 96–112)
Chloride: 109 mEq/L (ref 96–112)
Creatinine, Ser: 0.84 mg/dL (ref 0.4–1.2)
Creatinine, Ser: 1 mg/dL (ref 0.4–1.2)
GFR calc Af Amer: 50 mL/min — ABNORMAL LOW (ref >60)
GFR calc Af Amer: >60 mL/min (ref >60)
GFR calc Af Amer: >60 mL/min (ref >60)
GFR calc non Af Amer: 54 mL/min — ABNORMAL LOW (ref >60)
GFR calc non Af Amer: >60 mL/min (ref >60)
Glucose, Bld: 118 mg/dL — ABNORMAL HIGH (ref 70–99)
Potassium: 4.2 mEq/L (ref 3.5–5.1)
Sodium: 140 mEq/L (ref 135–145)
Total Bilirubin: 0.7 mg/dL (ref 0.3–1.2)
Total Bilirubin: 0.7 mg/dL (ref 0.3–1.2)
Total Bilirubin: 0.7 mg/dL (ref 0.3–1.2)
Total Protein: 6.3 g/dL (ref 6.0–8.3)

## 2011-01-03 LAB — BASIC METABOLIC PANEL
BUN: 11 mg/dL (ref 6–23)
BUN: 17 mg/dL (ref 6–23)
BUN: 19 mg/dL (ref 6–23)
CO2: 28 mEq/L (ref 19–32)
Calcium: 9.4 mg/dL (ref 8.4–10.5)
Chloride: 102 mEq/L (ref 96–112)
Chloride: 102 mEq/L (ref 96–112)
Creatinine, Ser: 0.92 mg/dL (ref 0.4–1.2)
Creatinine, Ser: 1.19 mg/dL (ref 0.4–1.2)
GFR calc non Af Amer: 60 mL/min — ABNORMAL LOW (ref >60)
Glucose, Bld: 106 mg/dL — ABNORMAL HIGH (ref 70–99)
Glucose, Bld: 109 mg/dL — ABNORMAL HIGH (ref 70–99)
Potassium: 3.9 mEq/L (ref 3.5–5.1)
Sodium: 137 mEq/L (ref 135–145)

## 2011-01-03 LAB — URINALYSIS, ROUTINE W REFLEX MICROSCOPIC
Bilirubin Urine: NEGATIVE
Nitrite: NEGATIVE
Specific Gravity, Urine: 1.018 (ref 1.005–1.030)
Urobilinogen, UA: 0.2 mg/dL (ref 0.0–1.0)
pH: 5.5 (ref 5.0–8.0)

## 2011-01-03 LAB — CROSSMATCH
ABO/RH(D): A POS
Antibody Screen: NEGATIVE

## 2011-01-03 LAB — LIPID PANEL
HDL: 64 mg/dL (ref >39)
Total CHOL/HDL Ratio: 3.8 RATIO
VLDL: 28 mg/dL (ref 0–40)

## 2011-01-03 LAB — URINE CULTURE: Colony Count: 40000

## 2011-01-03 LAB — CULTURE, BLOOD (ROUTINE X 2)

## 2011-01-03 LAB — URINE MICROSCOPIC-ADD ON

## 2011-01-03 LAB — MAGNESIUM: Magnesium: 2.1 mg/dL (ref 1.5–2.5)

## 2011-01-04 LAB — CROSSMATCH: Antibody Screen: NEGATIVE

## 2011-01-04 LAB — SAMPLE TO BLOOD BANK

## 2011-01-05 LAB — CROSSMATCH: Antibody Screen: NEGATIVE

## 2011-01-05 LAB — T-HELPER CELLS (CD4) COUNT (NOT AT ARMC): CD4 T Cell Abs: 260 uL — ABNORMAL LOW (ref 400–2700)

## 2011-01-06 LAB — CROSSMATCH
ABO/RH(D): A POS
ABO/RH(D): A POS
Antibody Screen: NEGATIVE
Antibody Screen: NEGATIVE

## 2011-01-07 LAB — PREPARE PLATELETS

## 2011-01-07 LAB — CROSSMATCH
ABO/RH(D): A POS
ABO/RH(D): A POS
Antibody Screen: NEGATIVE

## 2011-01-07 LAB — SAMPLE TO BLOOD BANK

## 2011-01-08 LAB — PREPARE PLATELETS

## 2011-01-08 LAB — CROSSMATCH
ABO/RH(D): A POS
Antibody Screen: NEGATIVE

## 2011-01-08 LAB — SAMPLE TO BLOOD BANK

## 2011-01-09 LAB — PREPARE PLATELETS

## 2011-01-09 LAB — CROSSMATCH: ABO/RH(D): A POS

## 2011-01-09 LAB — TYPE AND SCREEN

## 2011-01-13 LAB — CROSSMATCH
ABO/RH(D): A POS
ABO/RH(D): A POS
Antibody Screen: NEGATIVE
Antibody Screen: NEGATIVE

## 2011-01-13 LAB — PREPARE PLATELETS

## 2011-01-13 LAB — SAMPLE TO BLOOD BANK

## 2011-01-14 LAB — CBC
HCT: 27.6 % — ABNORMAL LOW (ref 36.0–46.0)
HCT: 27.6 % — ABNORMAL LOW (ref 36.0–46.0)
Hemoglobin: 9.3 g/dL — ABNORMAL LOW (ref 12.0–15.0)
Hemoglobin: 9.6 g/dL — ABNORMAL LOW (ref 12.0–15.0)
MCHC: 36.3 g/dL — ABNORMAL HIGH (ref 30.0–36.0)
MCV: 84.5 fL (ref 78.0–100.0)
MCV: 86 fL (ref 78.0–100.0)
Platelets: 13 10*3/uL — CL (ref 150–400)
Platelets: 20 10*3/uL — CL (ref 150–400)
Platelets: 31 10*3/uL — CL (ref 150–400)
RDW: 13.2 % (ref 11.5–15.5)
RDW: 13.6 % (ref 11.5–15.5)
WBC: 0.5 10*3/uL — CL (ref 4.0–10.5)
WBC: 0.7 10*3/uL — CL (ref 4.0–10.5)

## 2011-01-14 LAB — CROSSMATCH
ABO/RH(D): A POS
ABO/RH(D): A POS

## 2011-01-14 LAB — URINE MICROSCOPIC-ADD ON

## 2011-01-14 LAB — CULTURE, BLOOD (ROUTINE X 2)

## 2011-01-14 LAB — URINALYSIS, ROUTINE W REFLEX MICROSCOPIC
Ketones, ur: NEGATIVE mg/dL
Nitrite: NEGATIVE
Specific Gravity, Urine: 1.017 (ref 1.005–1.030)
Urobilinogen, UA: 1 mg/dL (ref 0.0–1.0)

## 2011-01-14 LAB — COMPREHENSIVE METABOLIC PANEL
AST: 31 U/L (ref 0–37)
Albumin: 3.5 g/dL (ref 3.5–5.2)
Chloride: 103 mEq/L (ref 96–112)
Creatinine, Ser: 1.43 mg/dL — ABNORMAL HIGH (ref 0.4–1.2)
GFR calc Af Amer: 44 mL/min — ABNORMAL LOW (ref >60)
Potassium: 3.7 mEq/L (ref 3.5–5.1)
Total Bilirubin: 1.2 mg/dL (ref 0.3–1.2)

## 2011-01-14 LAB — URINE CULTURE: Colony Count: >=100000

## 2011-01-14 LAB — DIFFERENTIAL
Band Neutrophils: 0 % (ref 0–10)
Basophils Absolute: 0 10*3/uL (ref 0.0–0.1)
Eosinophils Absolute: 0 10*3/uL (ref 0.0–0.7)
Eosinophils Relative: 0 % (ref 0–5)
Eosinophils Relative: 0 % (ref 0–5)
Lymphocytes Relative: 12 % (ref 12–46)
Lymphs Abs: 0 10*3/uL — ABNORMAL LOW (ref 0.7–4.0)
Lymphs Abs: 0.1 10*3/uL — ABNORMAL LOW (ref 0.7–4.0)
Monocytes Absolute: 0 10*3/uL — ABNORMAL LOW (ref 0.1–1.0)
Neutro Abs: 0.4 10*3/uL — ABNORMAL LOW (ref 1.7–7.7)
nRBC: 0 /100 WBC

## 2011-01-21 ENCOUNTER — Other Ambulatory Visit: Payer: Self-pay | Admitting: Oncology

## 2011-01-21 ENCOUNTER — Encounter (HOSPITAL_BASED_OUTPATIENT_CLINIC_OR_DEPARTMENT_OTHER): Payer: Medicare Other | Admitting: Oncology

## 2011-01-21 DIAGNOSIS — D0590 Unspecified type of carcinoma in situ of unspecified breast: Secondary | ICD-10-CM

## 2011-01-21 DIAGNOSIS — C9110 Chronic lymphocytic leukemia of B-cell type not having achieved remission: Secondary | ICD-10-CM

## 2011-01-21 DIAGNOSIS — Z5111 Encounter for antineoplastic chemotherapy: Secondary | ICD-10-CM

## 2011-01-21 DIAGNOSIS — C8599 Non-Hodgkin lymphoma, unspecified, extranodal and solid organ sites: Secondary | ICD-10-CM

## 2011-01-21 LAB — CBC WITH DIFFERENTIAL/PLATELET
Basophils Absolute: 0 10*3/uL (ref 0.0–0.1)
Eosinophils Absolute: 0.3 10*3/uL (ref 0.0–0.5)
HGB: 11.1 g/dL — ABNORMAL LOW (ref 11.6–15.9)
MCV: 111.9 fL — ABNORMAL HIGH (ref 79.5–101.0)
MONO#: 0.7 10*3/uL (ref 0.1–0.9)
MONO%: 7.9 % (ref 0.0–14.0)
NEUT#: 6.3 10*3/uL (ref 1.5–6.5)
RBC: 2.84 10*6/uL — ABNORMAL LOW (ref 3.70–5.45)
RDW: 14.3 % (ref 11.2–14.5)
WBC: 8.8 10*3/uL (ref 3.9–10.3)
lymph#: 1.4 10*3/uL (ref 0.9–3.3)

## 2011-01-21 LAB — MORPHOLOGY: PLT EST: DECREASED

## 2011-01-23 ENCOUNTER — Encounter (INDEPENDENT_AMBULATORY_CARE_PROVIDER_SITE_OTHER): Payer: Self-pay | Admitting: General Surgery

## 2011-02-11 NOTE — Op Note (Signed)
[name], [name]                  ACCOUNT NO.:  [id number]   MEDICAL RECORD NO.:  [id number]          PATIENT TYPE:  INP   LOCATION:  1340                         FACILITY:  [facility]   PHYSICIAN:  Genene Churn. Granfortuna, M.D.DATE OF BIRTH:  [birth year]   DATE OF PROCEDURE:  08/10/2008  DATE OF DISCHARGE:                               OPERATIVE REPORT   PROCEDURE:  Left posterior iliac crest bone marrow aspiration and biopsy  done with local 2% lidocaine anesthesia, 2 mg lorazepam IV  premedication.  No complications.   INDICATION:  Unexplained rapidly progressive pancytopenia with marked  thrombocytopenia.  Recent left axillary lymph node biopsy suggestive of  lymphoma.      Genene Churn. Cyndie Chime, M.D.  Electronically Signed     JMG/MEDQ  D:  08/10/2008  T:  08/10/2008  Job:  [id number]

## 2011-02-11 NOTE — H&P (Signed)
[name], [name]                  ACCOUNT NO.:  [id number]   MEDICAL RECORD NO.:  [id number]          PATIENT TYPE:  EMS   LOCATION:  ED                           FACILITY:  [facility]   PHYSICIAN:  Genene Churn. Granfortuna, M.D.DATE OF BIRTH:  October 02, 1933   DATE OF ADMISSION:  11/15/2008  DATE OF DISCHARGE:                              HISTORY & PHYSICAL   CHIEF COMPLAINT:  Malaise, fever.   HISTORY OF PRESENT ILLNESS:  [name] is a 77 year old woman with a  history of hypertension, hyperlipidemia, and psoriatic arthritis.  In  September 2009 she had an abnormal left mammogram.  There was a diffuse  area of abnormality at the 3 o'clock position in the left breast with  increased density.  An ultrasound showed diffusely dilated ducts in that  area and in addition bi-axillary lymphadenopathy with lymph nodes  measuring up to 3.5 cm.  She underwent a core needle biopsy on June 08, 2008 of both the breasts and a left axillary lymph node.  The breast  biopsy showed an intraductal papilloma.  The lymph node unexpectedly  showed diffuse proliferation of small lymphocytes which on  immunohistochemical stains were suspicious for a low grade, B-cell non-  Hodgkin's lymphoma versus CLL.  Cells were negative for CD10 and focally  positive for CD20 and CD45.  A lumpectomy was planned with lymph node  sampling.  A preoperative CBC done July 20, 2008 unexpectedly showed  anemia and thrombocytopenia with a hemoglobin of 10 and platelet count  59,000.  The survey was referred to Dr. Cyndie Chime for further  evaluation.  At the time of her initial visit August 09, 2008, CBC  showed a platelet count of 7000 and hemoglobin of 9.  She was  subsequently admitted for urgent evaluation.  Bone marrow biopsy done  August 10, 2008 showed a heavy involvement with non-Hodgkin's, B-cell  lymphoma consistent with small lymphocytic lymphoma/chronic lymphocytic  leukemia.  She had minimal adenopathy and no  splenomegaly on CT/PET.   She began treatment on August 12, 2008 with  fludarabine/Cytoxan/Rituxan; subsequent Rituxan on November 14, December  14, and September 25, 2008 with no response.  She began Campath, anti-  CD52 antibody subcu 3 times weekly on October 09, 2008.  She began week  #6  September 12, 2009.  Approximately 2 weeks ago, she developed  reactivation of CMV, and was started on oral ganciclovir.   [name] presented to the office today for the Campath injection.  She  complained of malaise, and was found to have a temperature of 102  degrees.  CBC showed hemoglobin 9.3, white count 0.8, and platelet count  26,000.  She is being admitted for further evaluation/treatment.   PAST MEDICAL HISTORY:  1. Hypertension.  2. Hypercholesterolemia.  3. Psoriatic arthritis diagnosed approximately 3 years ago.   CURRENT MEDICATIONS:  1. Prednisone 10 mg daily.  2. Valcyte 900 mg twice daily.  3. Hydrochlorothiazide 12.5 mg daily.  4. Amicar 2000 mg 4 times daily.  5. Acyclovir 400 mg 3 times daily.  6. Septra 1 tab twice daily Monday,  Wednesday, Friday.  7. Compazine 10 mg every 6 hours as needed.  8. Mycelex Troche 10 mg 4 times daily.  9. Zocor 40 mg daily.  10.Vitamin D 50,000 units weekly.  11.Tylenol as needed.   ALLERGIES:  PENICILLIN - anaphylaxis?Marland Kitchen  Acute facial/tongue swelling  following a parenteral dose of penicillin give 20 years ago following  dental extractions.  No need for hospitalization.   FAMILY HISTORY:  No history of malignancy.  Mother is deceased.   SOCIAL HISTORY:  [name] lives in [place].  She is a Geologist, engineering.  She has 2 sons and 2 daughters who are healthy.  She  does not smoke or use alcohol.   REVIEW OF SYSTEMS:  [name] reports a recent fall with a nose fracture.  She denies any fever.  She had an episode of shaking chills yesterday.  She states that she feels cold.  She has stable dyspnea on exertion.  She denies any  cough.  Appetite has been poor.  She denies any mouth  sores.  No throat pain.  No diplopia.  She intermittently notes blurry  vision from the right eye.  She denies any unusual headaches.  No  nausea, vomiting or diarrhea.  She occasionally notes black stools which  she relates to certain vegetables that she eats.  No rectal or other  bleeding.  She has had urinary urgency for 1 week.  She denies any  dysuria.  She has had no further falls.   PHYSICAL EXAMINATION:  VITAL SIGNS:  Temperature 102, heart rate 88,  blood pressure 161/89.  GENERAL:  Pleasant female in no acute distress.  HEENT:  Normocephalic, atraumatic.  Pupils equal, round, and reactive to  light.  Extraocular movements intact.  Sclerae anicteric.  Oropharynx is  without thrush or ulcerations.  CHEST:  Lungs clear bilaterally.  No wheezes or rales.  LYMPH:  No palpable adenopathy - regression of previously palpable left  axillary adenopathy.  CARDIOVASCULAR:  Regular rate and rhythm; 1-2/6 murmur - stble compared  with previous exam.  ABDOMEN:  Soft and nontender.  No obvious organomegaly.  She is tender  over the suprapubic region.  EXTREMITIES:  Trace to 1+ lower leg pretibial edema.  Calves are soft  and nontender.  Right upper extremity PICC site is nontender without  erythema.  NEUROVASCULAR: Alert and oriented x 3.  Motor strength 5/5.  Knee DTRs  absent symmetrically.   LABORATORY DATA:  Hemoglobin 9.3, white count 0.8, absolute neutrophil  count 0.7, platelet count 26,000.   IMPRESSION:  1. Fever in a highly immunocompromised patient with lymphoma.  2. 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  3. 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  4. Intraductal papilloma left breast.  5. Essential Hypertension.   PLAN:  1. Cultures  2. Broad-spectrum antibiotic coverage.  3. IV ganciclovir.  4. Continue prophylactic Diflucan.  5. Check  chest X-Ray   The patient interviewed and examined by Dr. Cyndie Chime.  Plan per Dr.  Cyndie Chime.      Lonna Cobb, N.P.      Genene Churn. Cyndie Chime, M.D.  Electronically Signed    LT/MEDQ  D:  11/15/2008  T:  11/15/2008  Job:  [id number]   cc:   Hall Busing  [facility] in [facility] T. Hoxworth, M.D.  [address]  [place]  [place]  [address] 

## 2011-02-11 NOTE — H&P (Signed)
[name], SPIELBERG                  ACCOUNT NO.:  [id number]   MEDICAL RECORD NO.:  [id number]          PATIENT TYPE:  INP   LOCATION:  1340                         [facility]ACILITY:  [facility]   PHYSICIAN:  Genene Churn. Granfortuna, M.D.DATE OF BIRTH:  1933/10/16   DATE OF ADMISSION:  08/09/2008  DATE OF DISCHARGE:                              HISTORY & PHYSICAL   CHIEF COMPLAINT:  Asymptomatic severe thrombocytopenia.   HISTORY OF PRESENT ILLNESS:  [name] is a very pleasant 77 year old  woman with a history of hypertension, hyperlipidemia and psoriatic  arthritis.  She had a recent abnormal left mammogram on June 08, 2008.  She had felt an abnormality in her left breast for about 2 years.  She had not had a mammogram in at least 5 years.  She developed a bloody  discharge from her nipple in September.  She went to her family  physician who ordered a mammogram which showed a diffuse area of  abnormality at 3 o'clock in the left breast with increased density.  Ultrasound showed diffusely dilated ducts in this area and in addition  biaxillary lymphadenopathy with lymph nodes up to 3.5 cm.  She underwent  a core needle biopsy on June 08, 2008, of both the breast and a  left axillary lymph node.  Breast biopsy showed a intraductal papilloma.  The lymph node, however, unexpectedly showed diffuse proliferation of  small lymphocytes which on immunohistochemical stains was suspicious for  a low-grade B-cell non-Hodgkin's lymphoma versus chronic lymphocytic  leukemia.  Cells were negative for CD10 and focally positive for CD20  and CD45.   A lumpectomy was planned with a lymph node sampling.  A preoperative CBC  was done on July 20, 2008, which unexpectedly showed anemia and  thrombocytopenia with a hemoglobin of 10, hematocrit 33, MCV 93 and  platelet count 59,000.  White count was normal at 4700 but a machine  differential showed 64% lymphocytes and only 14% neutrophils.  The  patient  was referred to me for further evaluation.  She reported to my  office today for an initial visit.  CBC done in my office now shows her  platelet count is 7000 with a hemoglobin of 9.  She is admitted for  urgent evaluation in view of significant increase in spontaneous  bleeding.   She has not noted any clinical bleeding and specifically denies any  epistaxis, gum bleeding, hematuria, hematochezia, melena or vaginal  bleeding.   She denies any severe headache or change in vision.   She has had increasing fatigue over the last year but no other  constitutional symptoms and specifically no fevers, anorexia or weight  loss.   PAST MEDICAL HISTORY:  Treated hypertension, hypercholesterolemia,  psoriatic arthritis which was just diagnosed about 3 years ago.  No  history of MI, arrhythmia, stomach ulcers, hepatitis, yellow jaundice,  malaria, kidney stones, thyroid trouble, seizure, stroke, blood clots,  excessive bleeding after surgery.  She had a large tumor resected from  her right parotid gland in 1990 at [facility] and  pathology was benign.  She did not require radiation or chemotherapy.  She had a hysterectomy with probable oophorectomy and appendectomy at  age 41 for pelvic infection.  Remote hemorrhoidectomy and tonsillectomy.  Status post excision of scar tissue from the lower abdomen subsequent to  her hysterectomy and I suspect this was excision of cheloids.   CURRENT MEDICATIONS:  1. HydroDIURIL 12.5 mg daily.  2. Zocor 40 mg daily.  3. Vitamin D 50,000 units q. week.  4. Tylenol p.r.n.  5. Tetracycline 500 mg t.i.d.  6. Temovate topical steroid preparation b.i.d.  7. Another capsule that she takes twice daily and she did not remember      the name.   ALLERGIES:  She is allergic to PENICILLIN with facial swelling and rash.   FAMILY HISTORY:  No history of malignant disease.  Mother is deceased.   SOCIAL HISTORY:  She is a Conservator, museum/gallery  and works in private  homes.  She has two sons, two daughters who are healthy.  She had one  child who died 6 weeks after birth and had two miscarriages.  She does  not smoke or use alcohol.  She is currently living in [place].   REVIEW OF SYSTEMS:  In addition to HPI no severe headache or change in  vision.  She has a rare episode of heartburn which can be quite severe,  none recently.  She has chronic polyarthralgias in both small and large  joints.  Chronic skin rash primarily hands and extensor surfaces of her  arms from the psoriasis.  She denies any dyspnea, orthopnea, chest pain,  palpitations, abdominal pain.  She tends to be constipated relieved with  p.r.n. laxatives.  No urinary tract symptoms.   PHYSICAL EXAMINATION:  GENERAL:  Physical exam shows a pleasant obese  African American woman.  VITAL SIGNS:  Height 5 feet, 4-1/2 inches, weight 248 pounds, blood  pressure 177/68, pulse 96 regular, respirations unlabored, temperature  97.6.  SKIN:  Skin of the hands is mottled and discolored.  There is a patchy  dermatitis on the elbows.  No ecchymoses or petechiae.  HEENT:  The pupils were equal, reactive to light.  Optic disks sharp.  Vessels normal.  No hemorrhage or exudate.  Pharynx no erythema, exudate  or bleeding.  NECK:  Neck is supple.  No thyromegaly.  Carotids 2+.  No bruits.  LUNGS:  Clear, resonant to percussion.  HEART:  Regular cardiac rhythm, 1-2/6 apical systolic murmur transmitted  to the left sternal border.  CHEST:  She is tender and has a dressing where she recently had a biopsy  in the left breast.  No dominant mass in the right breast.  A single  approximate 3 cm lymph node palpable high in the left axilla.  No right  axillary adenopathy.  No cervical or supraclavicular adenopathy.  ABDOMEN:  Abdomen is soft, obese, nontender.  No masses or organomegaly.  EXTREMITIES:  No edema.  No calf tenderness.  NEUROLOGIC:  Mental status intact.  Cranial nerves  intact.  Motor  strength 5/5.  Reflexes 1+ symmetric.  Coordination normal.   LAB:  Hemoglobin is 9, hematocrit 25, white count 4400, 39 neutrophils,  55 lymphocytes, 4 monocytes, 2 eosinophils, platelet count 7000,  reticulocyte count 1.3%.   Review of the peripheral blood film shows normochromic red cells with a  sub population of spherocytes.  No schistocytes.  Predominant white cell  is a mature lymphocyte.  There are some benign reactive appearing  lymphocytes.  A  rare lymphocyte containing nucleoli.  Platelets markedly  decreased one per high power field.  Neutrophils appear normal.   Chemistry profile is pending.   IMPRESSION:  1. Pancytopenia with severe thrombocytopenia.  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  She is at significant risk for spontaneous      bleeding.  She will be admitted at this time to receive parenteral      steroids.  I will obtain CT scans chest, abdomen and pelvis to look      for lymphadenopathy and or splenomegaly.  I will do a bone marrow      aspiration and biopsy.  2. Abnormal left axillary lymph node likely low-grade non-Hodgkin's      lymphoma.  Plan - staging evaluation as outlined above.  3. Possible invasive cancer of the left breast.  Although the initial      biopsies showed a benign proliferation in the breast tissue, a      gamma imaging study showed significant uptake such that invasive      cancer has not been definitively excluded.  For the time being we      will have to defer any definitive surgery until her hematologic      picture is stable.  4. Essential hypertension.  Medications may need adjusting while she      is in the hospital.  5. Psoriatic arthritis, a relatively new diagnosis.  6. Hyperlipidemia on medication.      Genene Churn. Cyndie Chime, M.D.  Electronically Signed     JMG/MEDQ  D:  08/09/2008  T:  08/10/2008  Job:  [id number]   cc:    Duanne Limerick  [place]   Sharlet Salina T. Hoxworth, M.D.  [address], [address]  [place]  [place] [address]   Dr Meyer Cory

## 2011-02-11 NOTE — Discharge Summary (Signed)
[name], [name]                  ACCOUNT NO.:  [id number]   MEDICAL RECORD NO.:  [id number]          PATIENT TYPE:  INP   LOCATION:  1340                         FACILITY:  [facility]   PHYSICIAN:  Genene Churn. Granfortuna, M.D.DATE OF BIRTH:  [birth year]   DATE OF ADMISSION:  08/09/2008  DATE OF DISCHARGE:  08/23/2008                               DISCHARGE SUMMARY   This was an urgent admission for this pleasant 77 year old woman who  recently had an evaluation of an abnormal area in her left breast.  A  routine mammogram showed a diffuse area of abnormality at 3 o'clock in  the left breast.  Ultrasound showed diffuse dilatation of ducts in this  area and the presence of axillary adenopathy.  Needle biopsy done on  June 08, 2008:  The breast  showed intraductal papilloma  but the  lymph node unexpectedly showed a diffuse proliferation of small  lymphocytes  with special stains consistent with a low grade B-cell non-  Hodgkin's lymphoma versus chronic lymphocytic leukemia.  A lumpectomy  with lymph node sampling was planned, but a preop CBC showed anemia and  thrombocytopenia with hemoglobin 10 and platelet count of 59,000,  white  count 4700 with 64% lymphocytes, 14% neutrophils.  The patient was  referred to me for further evaluation before proceeding with definitive  surgery.  Repeat CBC in my office on day of evaluation showed a rapid  fall in her platelet count now down to 7000 and further fall in her  hemoglobin to 9 g.  I felt she was in need of urgent inpatient  evaluation in view of life-threatening thrombocytopenia.   PERTINENT PHYSICAL FINDINGS:  On initial exam a pleasant overweight  African American woman.  5 feet 4 inches tall, 248 pounds, blood pressure 177/68, pulse 96  regular, respirations unlabored, temperature 97.6.  Diffuse changes on her skin consistent with previously diagnosed  psoriasis with both patchy and nodular lesions.  Surgical changes in the  left  breast from recent biopsy.  No right breast masses.  A single 3-cm  lymph node was palpable high in the left axilla.  No cervical,  supraclavicular or right axillary adenopathy.  ABDOMEN:  Obese but no gross mass or organomegaly.   LABORATORIES:  Hemoglobin 9, hematocrit 25, white count 4400, 39%  neutrophils, 55 lymphocytes, platelet count 7000, reticulocyte count  1.3%.   Review of the peripheral blood film showed normochromic, normocytic red  cells, occasional spherocytes.  The predominant white blood cell was a  mature lymphocyte.  A rare immature lymphocyte with nucleoli.  Neutrophils appeared normal.  Platelets were markedly decreased 1 per  high power field or less.   Serum LDH was normal at 154 and uric acid normal at 6.3.  BUN of 25,  creatinine 1.1.  Normal liver chemistries.  Blood type was A, Rh  positive.   HOSPITAL COURSE:  She was started on parenteral steroids initially with  Solu-Medrol, and then changed to oral prednisone.  I thought that she  might have an immune component to her thrombocytopenia since the degree  of thrombocytopenia  seemed disproportionate to the degree of  lymphadenopathy.  CT scan of the chest, abdomen, and pelvis was done,  and showed enlarged axillary and subpectoral lymph nodes bilaterally in  a distribution more typical for lymphoma than breast cancer.  There was  a single prominent right paratracheal node.  There was no abdominal or  pelvic lymphadenopathy.  There was no splenomegaly.  Lymph nodes were  minimally enlarged in the pelvis with no node larger than 1.6 cm.  Uterus noted to be surgically absent.  Radiologist read several small  bilateral pulmonary nodules, largest 7 mm suspicious for metastatic  disease.  THIS DID NOT fit the clinical picture in a nonsmoker, and  these are likely granulomas and will be followed on subsequent studies.   There was asymmetric parenchymal density in the left breast tissue  consistent with a known  recent diagnosis of breast cancer.   A right posterior iliac crest bone marrow aspiration and biopsy was  performed to complete her staging evaluation.  I personally reviewed the  microscope slides.   Procedure done on August 10, 2008.  Biopsy section were  50% to 60%  cellular.  There were both nodular and interstitial infiltrates of small  lymphocytes throughout the marrow.  On the aspirate the lymphocytes  accounted for 82% of a 500 cell differential. Megakaryocytes were  significantly decreased.  Flow cytometry showed a kappa restricted  monoclonal B-cell population with co-expression of CD5 and CD20 and also  positive for CD19, 21, 22, and 23 consistent with a small lymphocytic  lymphoma or a chronic lymphocytic leukemia.   After a lengthy discussion with the patient and multiple family members  rediagnosis of low grade B-cell non-Hodgkin's lymphoma including risks  versus benefit of therapy, the patient consented to begin immediate  treatment.  She was started on a program called FCR Light which uses  modified doses of 2 chemotherapy drugs, fludarabine and Cytoxan, with  escalated doses of Rituxan, anti B-cell monoclonal antibody.  She was  started on prophylactic Septra, acyclovir, and Mycelex troches.  She  received the 1st dose of Rituxan on  August 12, 2008.  Based on height  5 feet 4 inches, weight 110 kg, 242 pounds, body surface area 2.2 sq m,  total dose of Rituxan 375 mg/sq m equals 825 mg IV over 6 hours with  standard premedication of Tylenol and Benadryl.  She tolerated the  product extremely well with no acute allergic reactions.  On day #2  beginning November 15 she received fludarabine 20 mg/sq m, total dose 44  mg IV over 30 minutes daily x3 days plus Cytoxan 150 mg/sq m, total dose  330 mg IV over 1 hour, days #1, #2, and #3.  The protocol will give a  2nd dose of Rituxan at an increased dose of 500 mg/sq m on day #14 of  the program.  For subsequent cycles  Rituxan will be given day #1 and day  #14 at 500 mg/sq m.  Protocol specifies Neupogen support which was  started on the day after the final dose of chemotherapy with Neupogen  480 mcg subcutaneous daily.   I elected to continue steroids for the 1st cycle.  She is not currently  immunocompromised, and I feel the infection risk is low compared with  the potential benefit of the steroids.  As noted above,  megakaryocytes  were significantly decreased in the bone marrow which is an unusual  finding for either CLL or well-differentiated lymphocytic lymphoma.  She tolerated all of the chemotherapy very well without any acute  toxicities.  She did get expected fall in her blood counts with  hemoglobin down to a low of 8 g by November 13 more likely due to her  disease than the treatment.  She received a single 2 units packed red  cell transfusion.  Platelet count which fell as low as 6000 prior to  treatment remained low.  I tried to minimize platelet transfusion  support in view of known rapid alloimmunization with platelet  transfusion.  She received a total of 3 units of platelets for the  entire hospital course.  At time of the 1st platelet transfusion  platelet count went up from a low of 6000 to a peak of 28,000.  Following a 2nd platelet transfusion given on November 20, platelet  count went from 10,000 to a peak of 27,000.  Platelet count drifted down  again to 13,000 by day of discharge, and an additional unit of platelets  was given.   White blood count rose to a peak of 8700 on the Neupogen and then  drifted down despite Neupogen support to discharge value of 2200 with  hemoglobin of 8.7 and platelet count 13,000 prior to transfusion.   A percutaneous central catheter was placed with the assistance of  radiology on the day of admission for  transfusion support and  chemotherapy administration.   There were no complications.   CONSULTATIONS:  None.   PROCEDURES:  1.  Chemotherapy for lymphoma.  2. Bone marrow aspiration and biopsy.  3. Blood and platelet transfusion.  4. Placement of PICC catheter.   DISCHARGE DIAGNOSES:  1. Well-differentiated lymphocytic lymphoma, B-cell subtype.  2. Pancytopenia with predominant thrombocytopenia secondary to #1.  3. Recent diagnosis intraductal papilloma left breast.  Further      evaluation deferred until hematologic picture is stable  4. Essential hypertension.  5. Psoriasis.  6. Subcentimeter pulmonary nodules likely granulomas.  7. Multi-nodular goiter.   DISPOSITION:  Condition is stable for discharge.  She is afebrile and  asymptomatic.   She will report to our office on Friday, November 27, and continue  Neupogen injections over the weekend.  PICC catheter will be flushed  Monday, Wednesdays, and Fridays in our office.  She will follow up with  me on Monday, November 30, and receive a 2nd planned dose of Rituxan in  our office.  She will resume limited activity until follow-up.  Regular  diet.   She will continue all outpatient medications including:   1. Hydrochlorothiazide 12.5 mg daily.  2. Simvastatin 40 mg h.s.  3. Vitamin D 50,000 units weekly.  4. Steroid ointment to skin b.i.d. or p.r.n.  5. She is advised to discontinue Nystatin and tetracycline.   NEW MEDICATION:  1. Septra DS 1 p.o. b.i.d. Monday, Wednesday, Friday.  2. Acyclovir 400 mg t.i.d.  3. Mycelex troches, dissolve 1 in mouth q.i.d.  4. Prednisone 20 mg 3 tablets after breakfast daily.  5. Allopurinol 100 mg 3 tablets daily for an additional 14 days.  6. Compazine 10 mg p.o. q.4 hours p.r.n. nausea.      Genene Churn. Cyndie Chime, M.D.  Electronically Signed     JMG/MEDQ  D:  08/23/2008  T:  08/23/2008  Job:  [id number]   cc:   Lorne Skeens. Hoxworth, M.D.  [address]  [place]  [place] [address]   Duanne Limerick, M.D.  [place]   Derek Jack, M.D.  c/o  [facility]  [facility]

## 2011-02-11 NOTE — Discharge Summary (Signed)
NAMEEARLISHA, [name]                  ACCOUNT NO.:  [id number]   MEDICAL RECORD NO.:  [id number]          PATIENT TYPE:  INP   LOCATION:  1344                         FACILITY:  [facility]   PHYSICIAN:  Genene Churn. Granfortuna, M.D.DATE OF BIRTH:  [birth year]   DATE OF ADMISSION:  11/15/2008  DATE OF DISCHARGE:  11/18/2008                               DISCHARGE SUMMARY   [name] is a pleasant 77 year old woman diagnosed with intraductal  carcinoma of the left breast in September of 2009 on a routine  mammogram.  Ultrasound showed an enlarged left axillary lymph node.  Biopsy of this unexpectedly showed a low-grade B-cell non-Hodgkin's  lymphoma.  Please see history and physical for full details of initial  diagnosis.   She has been on immunotherapy with Campath anti CD 52 antibody given  three times weekly by subcutaneous injection since October 09, 2008.  Routine survey lab analysis done weekly did demonstrate that she has  asymptomatic reactivation of CMV virus and was recently put on Valcyte  to suppress clinical CMV infection.  Due to the fact that she is  chronically neutropenic and thrombocytopenic, she is on a number of  other prophylactic medications including Septra DS b.i.d. on Mondays,  Wednesdays and Fridays, Mycelex Troches 10 mg four times daily, and  Avelox 400 mg daily.   On the day of the current admission, she was in our office for a  treatment and noted to have a fever of 102 degrees.  In view of her  severely immunocompromised status, she was admitted for further  evaluation and treatment.   Initial exam showed a pleasant African-American woman in no distress.  Pulse 88 and regular, blood pressure 161/89, no respiratory distress.  Temperature 102 degrees.  Pertinent physical findings included no  erythema or exudate in the oropharynx.  Clear lungs to auscultation and  percussion.  A 1-2/6 cardiac murmur, unchanged from prior exam.  No  tenderness or organomegaly in the  abdomen.   Initial CBC showed hemoglobin 9.3, white count 0.8, platelet count  26,000.   HOSPITAL COURSE:  Cultures were obtained, and she was started on broad-  spectrum parenteral antibiotics and parenteral ganciclovir.  A chest  radiograph showed no infiltrate or effusion.  Hemoglobin did fall to  6.9, and she received a blood transfusion.  Fortunately, her fever came  down within 24 hours of admission, and she remained afebrile for the  remainder of the hospital course.  She developed no new symptoms.  Blood  cultures and urine cultures remained sterile at 72 hours.  She was  otherwise stable.  I felt she was safe for discharge to continue oral  antibiotics as an outpatient.   CONSULTATIONS:  None.   PROCEDURES:  A blood transfusion.   DISCHARGE DIAGNOSES:  1. Fever in an immunocompromised patient.  2. Well-differentiated lymphocytic lymphoma.  3. Pancytopenia secondary to lymphoma and chemotherapy.  4. Reactivation cytomegalovirus, asymptomatic.  5. Intraductal papilloma, left breast.  6. Essential hypertension.  7. Anemia secondary to lymphoma and chemotherapy.   DISPOSITION:  Condition stable at time of discharge.  She will resume  regular activity, regular diet.  Follow up in my office in 2 days.  She  remains on a Monday, Wednesday, Friday schedule for blood counts, and  her Campath treatments.   MEDICATIONS:  1. Avelox 400 mg daily.  2. Prednisone 10 mg daily.  3. Valcyte 900 mg b.i.d.  4. Hydrochlorothiazide 12.5 mg daily.  5. Septra DS one p.o. b.i.d. Monday, Wednesday, Friday.  6. Mycelex troches 10 mg q.i.d.  7. Amicar 2000 mg q.i.d.  8. Compazine 10 mg q.6h. p.r.n. nausea.  9. Zocor 40 mg daily.  10.Vitamin D 50,000 units q. week.      Genene Churn. Cyndie Chime, M.D.  Electronically Signed     JMG/MEDQ  D:  12/13/2008  T:  12/13/2008  Job:  [id number]

## 2011-02-18 ENCOUNTER — Encounter: Payer: Medicare Other | Admitting: Oncology

## 2011-02-18 ENCOUNTER — Other Ambulatory Visit: Payer: Self-pay | Admitting: Oncology

## 2011-02-18 LAB — CBC WITH DIFFERENTIAL/PLATELET
BASO%: 0.3 % (ref 0.0–2.0)
Basophils Absolute: 0 10*3/uL (ref 0.0–0.1)
EOS%: 6.8 % (ref 0.0–7.0)
HGB: 9.9 g/dL — ABNORMAL LOW (ref 11.6–15.9)
MCH: 36.1 pg — ABNORMAL HIGH (ref 25.1–34.0)
MCHC: 33.4 g/dL (ref 31.5–36.0)
MCV: 108 fL — ABNORMAL HIGH (ref 79.5–101.0)
MONO%: 9.8 % (ref 0.0–14.0)
NEUT%: 23.5 % — ABNORMAL LOW (ref 38.4–76.8)
RDW: 14.6 % — ABNORMAL HIGH (ref 11.2–14.5)
lymph#: 3.5 10*3/uL — ABNORMAL HIGH (ref 0.9–3.3)

## 2011-02-18 LAB — MORPHOLOGY: PLT EST: DECREASED

## 2011-03-25 ENCOUNTER — Other Ambulatory Visit: Payer: Self-pay | Admitting: Oncology

## 2011-03-25 ENCOUNTER — Encounter (HOSPITAL_BASED_OUTPATIENT_CLINIC_OR_DEPARTMENT_OTHER): Payer: Medicare Other | Admitting: Oncology

## 2011-03-25 DIAGNOSIS — C9110 Chronic lymphocytic leukemia of B-cell type not having achieved remission: Secondary | ICD-10-CM

## 2011-03-25 DIAGNOSIS — D0590 Unspecified type of carcinoma in situ of unspecified breast: Secondary | ICD-10-CM

## 2011-03-25 DIAGNOSIS — C8599 Non-Hodgkin lymphoma, unspecified, extranodal and solid organ sites: Secondary | ICD-10-CM

## 2011-03-25 LAB — MORPHOLOGY: PLT EST: DECREASED

## 2011-03-25 LAB — COMPREHENSIVE METABOLIC PANEL
ALT: 31 U/L (ref 0–35)
AST: 28 U/L (ref 0–37)
Albumin: 4.2 g/dL (ref 3.5–5.2)
Alkaline Phosphatase: 103 U/L (ref 39–117)
Chloride: 107 mEq/L (ref 96–112)
Potassium: 4 mEq/L (ref 3.5–5.3)
Sodium: 139 mEq/L (ref 135–145)
Total Protein: 6.1 g/dL (ref 6.0–8.3)

## 2011-03-25 LAB — CBC WITH DIFFERENTIAL/PLATELET
Basophils Absolute: 0 10*3/uL (ref 0.0–0.1)
HCT: 32.2 % — ABNORMAL LOW (ref 34.8–46.6)
HGB: 11.3 g/dL — ABNORMAL LOW (ref 11.6–15.9)
LYMPH%: 58.1 % — ABNORMAL HIGH (ref 14.0–49.7)
MONO#: 0.4 10*3/uL (ref 0.1–0.9)
NEUT%: 28.3 % — ABNORMAL LOW (ref 38.4–76.8)
Platelets: 84 10*3/uL — ABNORMAL LOW (ref 145–400)
WBC: 5.1 10*3/uL (ref 3.9–10.3)
lymph#: 3 10*3/uL (ref 0.9–3.3)

## 2011-04-22 ENCOUNTER — Encounter (HOSPITAL_BASED_OUTPATIENT_CLINIC_OR_DEPARTMENT_OTHER): Payer: Medicare Other | Admitting: Oncology

## 2011-04-22 ENCOUNTER — Other Ambulatory Visit: Payer: Self-pay | Admitting: Oncology

## 2011-04-22 DIAGNOSIS — D0590 Unspecified type of carcinoma in situ of unspecified breast: Secondary | ICD-10-CM

## 2011-04-22 DIAGNOSIS — C9110 Chronic lymphocytic leukemia of B-cell type not having achieved remission: Secondary | ICD-10-CM

## 2011-04-22 DIAGNOSIS — C8599 Non-Hodgkin lymphoma, unspecified, extranodal and solid organ sites: Secondary | ICD-10-CM

## 2011-04-22 LAB — CBC WITH DIFFERENTIAL/PLATELET
EOS%: 3.6 % (ref 0.0–7.0)
Eosinophils Absolute: 0.2 10*3/uL (ref 0.0–0.5)
LYMPH%: 60.1 % — ABNORMAL HIGH (ref 14.0–49.7)
MCH: 37.6 pg — ABNORMAL HIGH (ref 25.1–34.0)
MCHC: 34.7 g/dL (ref 31.5–36.0)
MCV: 108.5 fL — ABNORMAL HIGH (ref 79.5–101.0)
MONO%: 9.6 % (ref 0.0–14.0)
NEUT#: 1.8 10*3/uL (ref 1.5–6.5)
Platelets: 87 10*3/uL — ABNORMAL LOW (ref 145–400)
RBC: 2.95 10*6/uL — ABNORMAL LOW (ref 3.70–5.45)

## 2011-04-22 LAB — MORPHOLOGY

## 2011-05-27 ENCOUNTER — Other Ambulatory Visit: Payer: Self-pay | Admitting: Oncology

## 2011-05-27 ENCOUNTER — Encounter (HOSPITAL_BASED_OUTPATIENT_CLINIC_OR_DEPARTMENT_OTHER): Payer: Medicare Other | Admitting: Oncology

## 2011-05-27 DIAGNOSIS — C9110 Chronic lymphocytic leukemia of B-cell type not having achieved remission: Secondary | ICD-10-CM

## 2011-05-27 DIAGNOSIS — C8599 Non-Hodgkin lymphoma, unspecified, extranodal and solid organ sites: Secondary | ICD-10-CM

## 2011-05-27 DIAGNOSIS — D0590 Unspecified type of carcinoma in situ of unspecified breast: Secondary | ICD-10-CM

## 2011-05-27 LAB — COMPREHENSIVE METABOLIC PANEL
BUN: 20 mg/dL (ref 6–23)
CO2: 25 mEq/L (ref 19–32)
Creatinine, Ser: 0.97 mg/dL (ref 0.50–1.10)
Glucose, Bld: 105 mg/dL — ABNORMAL HIGH (ref 70–99)
Total Bilirubin: 0.6 mg/dL (ref 0.3–1.2)

## 2011-05-27 LAB — CBC WITH DIFFERENTIAL/PLATELET
BASO%: 0.4 % (ref 0.0–2.0)
LYMPH%: 61.7 % — ABNORMAL HIGH (ref 14.0–49.7)
MCHC: 35.7 g/dL (ref 31.5–36.0)
MONO#: 0.5 10*3/uL (ref 0.1–0.9)
MONO%: 8.1 % (ref 0.0–14.0)
Platelets: 80 10*3/uL — ABNORMAL LOW (ref 145–400)
RBC: 2.94 10*6/uL — ABNORMAL LOW (ref 3.70–5.45)
WBC: 5.7 10*3/uL (ref 3.9–10.3)

## 2011-05-27 LAB — LACTATE DEHYDROGENASE: LDH: 170 U/L (ref 94–250)

## 2011-05-27 LAB — MORPHOLOGY: PLT EST: DECREASED

## 2011-06-18 LAB — PREPARE PLATELET PHERESIS

## 2011-07-01 LAB — CBC
HCT: 32.1 — ABNORMAL LOW
HCT: 22 — ABNORMAL LOW
HCT: 25.1 — ABNORMAL LOW
HCT: 27 — ABNORMAL LOW
HCT: 27.2 — ABNORMAL LOW
HCT: 28.2 — ABNORMAL LOW
HCT: 28.4 — ABNORMAL LOW
HCT: 28.4 — ABNORMAL LOW
HCT: 29.5 — ABNORMAL LOW
Hemoglobin: 8 — ABNORMAL LOW
Hemoglobin: 8.1 — ABNORMAL LOW
Hemoglobin: 8.3 — ABNORMAL LOW
Hemoglobin: 8.7 — ABNORMAL LOW
Hemoglobin: 9.4 — ABNORMAL LOW
Hemoglobin: 9.5 — ABNORMAL LOW
Hemoglobin: 9.6 — ABNORMAL LOW
Hemoglobin: 9.8 — ABNORMAL LOW
Hemoglobin: 9.8 — ABNORMAL LOW
MCHC: 34.4
MCHC: 34.6
MCHC: 35.1
MCHC: 35.3
MCHC: 35.4
MCHC: 35.7
MCV: 93.7
MCV: 91.7
MCV: 91.9
MCV: 91.9
MCV: 92
MCV: 92.6
MCV: 92.9
MCV: 93.1
MCV: 94.4
Platelets: 15 — CL
Platelets: 15 — CL
Platelets: 17 — CL
Platelets: 25 — CL
Platelets: 27 — CL
RBC: 3.43 — ABNORMAL LOW
RBC: 2.31 — ABNORMAL LOW
RBC: 2.42 — ABNORMAL LOW
RBC: 2.42 — ABNORMAL LOW
RBC: 2.9 — ABNORMAL LOW
RBC: 2.99 — ABNORMAL LOW
RBC: 3.03 — ABNORMAL LOW
RBC: 3.18 — ABNORMAL LOW
RBC: 3.45 — ABNORMAL LOW
RDW: 16 — ABNORMAL HIGH
RDW: 17 — ABNORMAL HIGH
RDW: 17.7 — ABNORMAL HIGH
WBC: 9.5
WBC: 10.1
WBC: 2.2 — ABNORMAL LOW
WBC: 3.2 — ABNORMAL LOW
WBC: 3.3 — ABNORMAL LOW
WBC: 3.4 — ABNORMAL LOW
WBC: 3.4 — ABNORMAL LOW
WBC: 4.3
WBC: 4.4
WBC: 4.9
WBC: 5.7
WBC: 7.1
WBC: 8.7

## 2011-07-01 LAB — DIFFERENTIAL
Basophils Absolute: 0
Basophils Absolute: 0
Basophils Absolute: 0
Basophils Absolute: 0
Basophils Absolute: 0
Basophils Relative: 0
Basophils Relative: 0
Basophils Relative: 0
Basophils Relative: 0
Basophils Relative: 0
Basophils Relative: 0
Basophils Relative: 0
Basophils Relative: 1
Eosinophils Absolute: 0.3
Eosinophils Absolute: 0
Eosinophils Absolute: 0
Eosinophils Absolute: 0
Eosinophils Absolute: 0
Eosinophils Relative: 0
Eosinophils Relative: 0
Eosinophils Relative: 0
Eosinophils Relative: 0
Eosinophils Relative: 0
Eosinophils Relative: 0
Eosinophils Relative: 0
Eosinophils Relative: 0
Eosinophils Relative: 0
Lymphocytes Relative: 75 — ABNORMAL HIGH
Lymphocytes Relative: 14
Lymphocytes Relative: 14
Lymphocytes Relative: 20
Lymphocytes Relative: 23
Lymphocytes Relative: 27
Lymphocytes Relative: 39
Lymphocytes Relative: 40
Lymphocytes Relative: 44
Lymphocytes Relative: 45
Lymphocytes Relative: 47 — ABNORMAL HIGH
Lymphs Abs: 7.1 — ABNORMAL HIGH
Lymphs Abs: 0.9
Lymphs Abs: 1
Lymphs Abs: 1
Lymphs Abs: 1
Lymphs Abs: 1
Lymphs Abs: 1.3
Lymphs Abs: 1.6
Lymphs Abs: 1.7
Lymphs Abs: 2.5
Monocytes Absolute: 0.6
Monocytes Absolute: 0.1
Monocytes Absolute: 0.1
Monocytes Absolute: 0.1
Monocytes Absolute: 0.2
Monocytes Absolute: 0.2
Monocytes Absolute: 0.3
Monocytes Relative: 0 — ABNORMAL LOW
Monocytes Relative: 0 — ABNORMAL LOW
Monocytes Relative: 1 — ABNORMAL LOW
Monocytes Relative: 1 — ABNORMAL LOW
Monocytes Relative: 2 — ABNORMAL LOW
Monocytes Relative: 3
Monocytes Relative: 3
Neutro Abs: 1.5 — ABNORMAL LOW
Neutro Abs: 1.2 — ABNORMAL LOW
Neutro Abs: 1.8
Neutro Abs: 2.4
Neutro Abs: 2.8
Neutro Abs: 2.9
Neutro Abs: 3.2
Neutro Abs: 3.5
Neutro Abs: 3.8
Neutrophils Relative %: 52
Neutrophils Relative %: 56
Neutrophils Relative %: 58
Neutrophils Relative %: 61
Neutrophils Relative %: 86 — ABNORMAL HIGH

## 2011-07-01 LAB — BASIC METABOLIC PANEL
CO2: 24
CO2: 29
Chloride: 100
Chloride: 102
Creatinine, Ser: 0.9
GFR calc Af Amer: 56 — ABNORMAL LOW
GFR calc Af Amer: >60
Glucose, Bld: 96
Potassium: 4
Sodium: 132 — ABNORMAL LOW
Sodium: 137

## 2011-07-01 LAB — ABO/RH: ABO/RH(D): A POS

## 2011-07-01 LAB — PREPARE PLATELET PHERESIS

## 2011-07-01 LAB — BONE MARROW EXAM

## 2011-07-01 LAB — TYPE AND SCREEN: ABO/RH(D): A POS

## 2011-07-01 LAB — COMPREHENSIVE METABOLIC PANEL
ALT: 28
AST: 27
AST: 24
Albumin: 4.2
Albumin: 3.7
Alkaline Phosphatase: 96
Alkaline Phosphatase: 90
Alkaline Phosphatase: 97
BUN: 25 — ABNORMAL HIGH
BUN: 34 — ABNORMAL HIGH
CO2: 28
CO2: 23
Chloride: 103
Chloride: 102
Chloride: 102
Creatinine, Ser: 0.86
Creatinine, Ser: 1.23 — ABNORMAL HIGH
GFR calc Af Amer: >60
GFR calc Af Amer: 60 — ABNORMAL LOW
GFR calc non Af Amer: >60
GFR calc non Af Amer: 43 — ABNORMAL LOW
Potassium: 3.5
Potassium: 3.6
Potassium: 4.2
Sodium: 135
Total Bilirubin: 1
Total Bilirubin: 1
Total Bilirubin: 1.2
Total Protein: 7

## 2011-07-01 LAB — URINALYSIS, ROUTINE W REFLEX MICROSCOPIC
Bilirubin Urine: NEGATIVE
Glucose, UA: NEGATIVE
Hgb urine dipstick: NEGATIVE
Ketones, ur: NEGATIVE
Protein, ur: NEGATIVE
Urobilinogen, UA: 0.2

## 2011-07-01 LAB — URIC ACID: Uric Acid, Serum: 6.3

## 2011-07-01 LAB — PREPARE RBC (CROSSMATCH)

## 2011-07-01 LAB — GLUCOSE, CAPILLARY

## 2011-07-01 LAB — TISSUE HYBRIDIZATION (BONE MARROW)-NCBH

## 2011-07-01 LAB — CHROMOSOME ANALYSIS, BONE MARROW

## 2011-07-04 LAB — CROSSMATCH
ABO/RH(D): A POS
ABO/RH(D): A POS
ABO/RH(D): A POS
Antibody Screen: NEGATIVE
Antibody Screen: NEGATIVE
Antibody Screen: NEGATIVE
Antibody Screen: NEGATIVE

## 2011-07-04 LAB — PREPARE PLATELET PHERESIS

## 2011-07-25 ENCOUNTER — Other Ambulatory Visit: Payer: Self-pay | Admitting: Oncology

## 2011-07-25 ENCOUNTER — Encounter (HOSPITAL_BASED_OUTPATIENT_CLINIC_OR_DEPARTMENT_OTHER): Payer: Medicare Other | Admitting: Oncology

## 2011-07-25 DIAGNOSIS — D0590 Unspecified type of carcinoma in situ of unspecified breast: Secondary | ICD-10-CM

## 2011-07-25 DIAGNOSIS — C9110 Chronic lymphocytic leukemia of B-cell type not having achieved remission: Secondary | ICD-10-CM

## 2011-07-25 DIAGNOSIS — C8599 Non-Hodgkin lymphoma, unspecified, extranodal and solid organ sites: Secondary | ICD-10-CM

## 2011-07-25 LAB — CBC WITH DIFFERENTIAL/PLATELET
Basophils Absolute: 0 10*3/uL (ref 0.0–0.1)
EOS%: 3.8 % (ref 0.0–7.0)
Eosinophils Absolute: 0.3 10*3/uL (ref 0.0–0.5)
HCT: 32.6 % — ABNORMAL LOW (ref 34.8–46.6)
HGB: 11.3 g/dL — ABNORMAL LOW (ref 11.6–15.9)
MCH: 38.3 pg — ABNORMAL HIGH (ref 25.1–34.0)
MCV: 110.5 fL — ABNORMAL HIGH (ref 79.5–101.0)
MONO%: 9.1 % (ref 0.0–14.0)
NEUT#: 1.6 10*3/uL (ref 1.5–6.5)
NEUT%: 19.5 % — ABNORMAL LOW (ref 38.4–76.8)
RDW: 14 % (ref 11.2–14.5)
lymph#: 5.5 10*3/uL — ABNORMAL HIGH (ref 0.9–3.3)

## 2011-07-25 LAB — MORPHOLOGY

## 2011-09-18 ENCOUNTER — Ambulatory Visit (INDEPENDENT_AMBULATORY_CARE_PROVIDER_SITE_OTHER): Payer: Medicare Other | Admitting: General Surgery

## 2011-09-18 ENCOUNTER — Encounter (INDEPENDENT_AMBULATORY_CARE_PROVIDER_SITE_OTHER): Payer: Self-pay | Admitting: General Surgery

## 2011-09-18 VITALS — BP 136/82 | Temp 98.4°F | Resp 18 | Ht 64.5 in | Wt 258.2 lb

## 2011-09-18 DIAGNOSIS — C50919 Malignant neoplasm of unspecified site of unspecified female breast: Secondary | ICD-10-CM | POA: Insufficient documentation

## 2011-09-18 NOTE — Progress Notes (Signed)
History: Patient returns for followup of her cancer the left breast approaching one year postop mastectomy and negative sentinel lymph node biopsy for multifocal T2 N0 invasive cancer. She is followed by Dr. Marlena Clipper. She was on tamoxifen but stopped this due to side effects. She has an appointment with him soon. She has no particular complaints regarding her mastectomy site or other problems.  Exam: Gen.: Appears well HEENT: No palpable masses or thyromegaly. Lymph nodes: No palpable cervical, supraclavicular, or axillary lymph nodes Breasts: Left chest wall is negative for skin changes, mass, or other signs of local recurrence. Right breast is negative to exam.  Assessment and plan: No evidence of recurrence or complications one year following mastectomy. She is not currently on adjuvant treatment due to side effects but has an appointment soon with Dr. Marlena Clipper. Imaging due in the next couple of months. I will see her back in 6 months.

## 2011-10-10 ENCOUNTER — Telehealth: Payer: Self-pay | Admitting: Oncology

## 2011-10-10 ENCOUNTER — Ambulatory Visit (HOSPITAL_BASED_OUTPATIENT_CLINIC_OR_DEPARTMENT_OTHER): Payer: Medicare Other | Admitting: Nurse Practitioner

## 2011-10-10 ENCOUNTER — Other Ambulatory Visit (HOSPITAL_BASED_OUTPATIENT_CLINIC_OR_DEPARTMENT_OTHER): Payer: Medicare Other | Admitting: Lab

## 2011-10-10 ENCOUNTER — Ambulatory Visit: Payer: Medicare Other | Admitting: Oncology

## 2011-10-10 ENCOUNTER — Other Ambulatory Visit: Payer: Self-pay | Admitting: Oncology

## 2011-10-10 VITALS — BP 147/61 | HR 56 | Temp 98.8°F | Ht 64.5 in | Wt 254.2 lb

## 2011-10-10 DIAGNOSIS — C9110 Chronic lymphocytic leukemia of B-cell type not having achieved remission: Secondary | ICD-10-CM

## 2011-10-10 DIAGNOSIS — C8599 Non-Hodgkin lymphoma, unspecified, extranodal and solid organ sites: Secondary | ICD-10-CM

## 2011-10-10 DIAGNOSIS — C8300 Small cell B-cell lymphoma, unspecified site: Secondary | ICD-10-CM

## 2011-10-10 DIAGNOSIS — D0590 Unspecified type of carcinoma in situ of unspecified breast: Secondary | ICD-10-CM

## 2011-10-10 DIAGNOSIS — C50919 Malignant neoplasm of unspecified site of unspecified female breast: Secondary | ICD-10-CM

## 2011-10-10 DIAGNOSIS — Z9010 Acquired absence of unspecified breast and nipple: Secondary | ICD-10-CM

## 2011-10-10 DIAGNOSIS — Z170 Estrogen receptor positive status [ER+]: Secondary | ICD-10-CM

## 2011-10-10 DIAGNOSIS — Z1231 Encounter for screening mammogram for malignant neoplasm of breast: Secondary | ICD-10-CM

## 2011-10-10 LAB — COMPREHENSIVE METABOLIC PANEL
AST: 29 U/L (ref 0–37)
Albumin: 4.5 g/dL (ref 3.5–5.2)
Alkaline Phosphatase: 103 U/L (ref 39–117)
BUN: 20 mg/dL (ref 6–23)
Creatinine, Ser: 1.14 mg/dL — ABNORMAL HIGH (ref 0.50–1.10)
Glucose, Bld: 137 mg/dL — ABNORMAL HIGH (ref 70–99)
Total Bilirubin: 0.6 mg/dL (ref 0.3–1.2)

## 2011-10-10 LAB — MORPHOLOGY

## 2011-10-10 LAB — CBC WITH DIFFERENTIAL/PLATELET
BASO%: 0.1 % (ref 0.0–2.0)
EOS%: 1.8 % (ref 0.0–7.0)
Eosinophils Absolute: 0.2 10*3/uL (ref 0.0–0.5)
MCH: 39.1 pg — ABNORMAL HIGH (ref 25.1–34.0)
MCV: 113.1 fL — ABNORMAL HIGH (ref 79.5–101.0)
MONO%: 4.5 % (ref 0.0–14.0)
NEUT#: 1.6 10*3/uL (ref 1.5–6.5)
RBC: 2.9 10*6/uL — ABNORMAL LOW (ref 3.70–5.45)
RDW: 14.8 % — ABNORMAL HIGH (ref 11.2–14.5)

## 2011-10-10 NOTE — Progress Notes (Signed)
OFFICE PROGRESS NOTE  Interval history:  [name] is a 78 year old woman with well-differentiated lymphocytic lymphoma currently in a partial remission and multifocal invasive and intraductal carcinoma of the left breast, ER positive, status post mastectomy with sentinel lymph node procedure 10/09/2010. She was unable to tolerate Aromasin and was switched to tamoxifen in March 2012. She is seen today for scheduled followup.  [name] reports that she discontinued tamoxifen due to dizziness and nausea. She has had 2 illnesses with nausea/vomiting since her last visit. She overall feels well. She denies fevers. She has periodic night sweats. She has a good appetite. She denies shortness of breath. No cough. She has chronic pain at the right knee. Bowels moving regularly. She urinates frequently which she attributes to hydrochlorothiazide.   Objective: Blood pressure 147/61, pulse 56, temperature 98.8 F (37.1 C), temperature source Oral, height 5' 4.5" (1.638 m), weight 254 lb 3.2 oz (115.304 kg).  Oropharynx is without thrush or ulceration. No palpable cervical, supraclavicular, axillary or inguinal lymph nodes. Lungs are clear. No wheezes or rales. Status post left mastectomy. No evidence of chest wall recurrence. Regular cardiac rhythm. Abdomen is soft and nontender. No organomegaly. Very minimal lower leg edema bilaterally. Motor strength 5 over 5.   Lab Results: Lab Results  Component Value Date   WBC 8.4 10/10/2011   HGB 11.3* 10/10/2011   HCT 32.8* 10/10/2011   MCV 113.1* 10/10/2011   PLT 92* 10/10/2011    Chemistry:    Chemistry      Component Value Date/Time   NA 142 05/27/2011 1126   K 4.1 05/27/2011 1126   CL 110 05/27/2011 1126   CO2 25 05/27/2011 1126   BUN 20 05/27/2011 1126   CREATININE 0.97 05/27/2011 1126      Component Value Date/Time   CALCIUM 9.7 05/27/2011 1126   ALKPHOS 104 05/27/2011 1126   AST 26 05/27/2011 1126   ALT 32 05/27/2011 1126   BILITOT 0.6 05/27/2011 1126       Studies/Results: No results found.  Medications: I have reviewed the patient's current medications.  Assessment/Plan:  1. Well-differentiated lymphocytic lymphoma presenting with rapidly progressive pancytopenia in November 2009.  She was found to have left axillary lymphadenopathy and a mass in the left breast.  Biopsy of the breast showed DCIS.  However, the axillary node showed a lymphoma with immunophenotype consistent with WDLL versus CLL.  Disease became rapidly progressive and she became transfusion dependent for red cells and platelets.  She was treated with a series of different chemotherapy and immunotherapy agents, initially with fludarabine, Cytoxan and Rituxan, then Campath and ultimately she achieved a partial response to ofatumumab (Arzerra).  She has been off all treatment since 03/19/2010.  2. Multifocal invasive and intraductal carcinoma, estrogen receptor positive, left breast.  Initial biopsy at time of diagnosis of her lymphoma in September 2009.  Definitive treatment delayed until her blood condition was under control.  Subsequent left mastectomy with sentinel lymph node procedure 10/09/2010.  Pathology with multifocal invasive cancer up to 2.6 cm with a separate area of DCIS 0.7 cm.  Lymph nodes negative for breast cancer.  ER/PR strongly positive, HER-2  negative.  She was started on hormonal therapy with Aromasin, but was unable to tolerate and was switched to tamoxifen in March of this year. She has since discontinued tamoxifen due to dizziness. 3. Essential hypertension. 4. Cerebrovascular disease status post stroke in September 2010. 5. Chronic dermatitis.  Disposition -- [name] remains in a partial remission  from the well-differentiated lymphocytic lymphoma. We will continue periodic monitoring of her blood counts. At today's visit she reported discontinuing tamoxifen due to dizziness. We discussed a retrial of tamoxifen which she declined. We have referred her for  an annual screening mammogram. She will return for labs in 2 months and a followup visit with Dr. Cyndie Chime and labs in 4 months. She will contact the office the interim with any problems.    [name] ANP/GNP-BC

## 2011-10-10 NOTE — Telephone Encounter (Signed)
Called [facility] to schedule mammo this morning. Per [facility] dx needed to be changed and they sent message to LT. Per LT she received the message and changed to dx. Per jennifer @ [facility] appt still could not be scheduled due to LT needs to remove the Breast Cancer dx completely from the referral. Message to LT. Pt given schedule for march/april b4 leaving today.

## 2011-10-10 NOTE — Telephone Encounter (Signed)
gv pt appt schedule for march/may. Pt aware she will be contacted w/mammo appt - LT to correct dx.

## 2011-11-12 ENCOUNTER — Ambulatory Visit: Payer: Medicare Other

## 2011-11-13 ENCOUNTER — Ambulatory Visit: Payer: Medicare Other

## 2011-11-17 ENCOUNTER — Ambulatory Visit
Admission: RE | Admit: 2011-11-17 | Discharge: 2011-11-17 | Disposition: A | Discharge status: Home / Self Care | Payer: Medicare Other | Source: Ambulatory Visit | Attending: Nurse Practitioner | Admitting: Nurse Practitioner

## 2011-11-17 ENCOUNTER — Other Ambulatory Visit: Payer: Self-pay | Admitting: Oncology

## 2011-11-17 DIAGNOSIS — C50919 Malignant neoplasm of unspecified site of unspecified female breast: Secondary | ICD-10-CM

## 2011-11-17 DIAGNOSIS — Z1231 Encounter for screening mammogram for malignant neoplasm of breast: Secondary | ICD-10-CM

## 2011-12-02 ENCOUNTER — Other Ambulatory Visit (HOSPITAL_BASED_OUTPATIENT_CLINIC_OR_DEPARTMENT_OTHER): Payer: Medicare Other | Admitting: Lab

## 2011-12-02 DIAGNOSIS — C8300 Small cell B-cell lymphoma, unspecified site: Secondary | ICD-10-CM

## 2011-12-02 DIAGNOSIS — C8599 Non-Hodgkin lymphoma, unspecified, extranodal and solid organ sites: Secondary | ICD-10-CM

## 2011-12-02 LAB — MORPHOLOGY: PLT EST: DECREASED

## 2011-12-02 LAB — CBC WITH DIFFERENTIAL/PLATELET
BASO%: 0.2 % (ref 0.0–2.0)
EOS%: 1.3 % (ref 0.0–7.0)
HCT: 31.8 % — ABNORMAL LOW (ref 34.8–46.6)
MCH: 37.9 pg — ABNORMAL HIGH (ref 25.1–34.0)
MCHC: 34 g/dL (ref 31.5–36.0)
NEUT%: 16.8 % — ABNORMAL LOW (ref 38.4–76.8)
RBC: 2.85 10*6/uL — ABNORMAL LOW (ref 3.70–5.45)
RDW: 14.1 % (ref 11.2–14.5)
lymph#: 6.6 10*3/uL — ABNORMAL HIGH (ref 0.9–3.3)

## 2011-12-03 ENCOUNTER — Telehealth: Payer: Self-pay | Admitting: *Deleted

## 2011-12-03 NOTE — Telephone Encounter (Signed)
Received vm call from pt asking someone to call her.  Call returned & she reports that she saw ortho yest & they recommended steroid injection or surgery right knee.  She reports arthritis & bone on bone.  She wanted Dr. Patsy Lager opinion.

## 2011-12-03 NOTE — Telephone Encounter (Signed)
Pt notified that Dr. Cyndie Chime was OK with trying steroid injections.

## 2011-12-23 ENCOUNTER — Encounter: Payer: Self-pay | Admitting: Oncology

## 2011-12-23 NOTE — Progress Notes (Signed)
Patient approved for 100% discount EPP, for family of 1 income [phone]

## 2012-01-16 ENCOUNTER — Telehealth: Payer: Self-pay | Admitting: *Deleted

## 2012-01-16 NOTE — Telephone Encounter (Signed)
Received call from pt reporting that she feels like she is going to faint & her chest hurts.  She is audibly crying & reports that she is in [facility] & can't get any of her children there for a couple of hours.  Her granddaughter is there but has to leave to go to work.  Encouraged her to call 911 & get to ED.  Discussed with Dr Cyndie Chime & he is in agreement.

## 2012-01-23 ENCOUNTER — Other Ambulatory Visit: Payer: Self-pay | Admitting: *Deleted

## 2012-01-23 DIAGNOSIS — I10 Essential (primary) hypertension: Secondary | ICD-10-CM

## 2012-01-23 MED ORDER — HYDROCHLOROTHIAZIDE 12.5 MG PO CAPS: 12.5000 mg | ORAL_CAPSULE | Freq: Every day | ORAL | Status: DC

## 2012-02-03 ENCOUNTER — Other Ambulatory Visit (HOSPITAL_BASED_OUTPATIENT_CLINIC_OR_DEPARTMENT_OTHER): Payer: Medicare Other | Admitting: Lab

## 2012-02-03 ENCOUNTER — Ambulatory Visit (HOSPITAL_BASED_OUTPATIENT_CLINIC_OR_DEPARTMENT_OTHER): Payer: Medicare Other | Admitting: Oncology

## 2012-02-03 ENCOUNTER — Other Ambulatory Visit: Payer: Self-pay | Admitting: *Deleted

## 2012-02-03 ENCOUNTER — Encounter: Payer: Self-pay | Admitting: Oncology

## 2012-02-03 VITALS — BP 167/59 | HR 56 | Temp 97.9°F | Ht 64.5 in | Wt 255.0 lb

## 2012-02-03 DIAGNOSIS — C8590 Non-Hodgkin lymphoma, unspecified, unspecified site: Secondary | ICD-10-CM

## 2012-02-03 DIAGNOSIS — I10 Essential (primary) hypertension: Secondary | ICD-10-CM

## 2012-02-03 DIAGNOSIS — I639 Cerebral infarction, unspecified: Secondary | ICD-10-CM

## 2012-02-03 DIAGNOSIS — D539 Nutritional anemia, unspecified: Secondary | ICD-10-CM

## 2012-02-03 DIAGNOSIS — C8589 Other specified types of non-Hodgkin lymphoma, extranodal and solid organ sites: Secondary | ICD-10-CM

## 2012-02-03 DIAGNOSIS — H538 Other visual disturbances: Secondary | ICD-10-CM

## 2012-02-03 DIAGNOSIS — C8599 Non-Hodgkin lymphoma, unspecified, extranodal and solid organ sites: Secondary | ICD-10-CM

## 2012-02-03 DIAGNOSIS — C50919 Malignant neoplasm of unspecified site of unspecified female breast: Secondary | ICD-10-CM

## 2012-02-03 DIAGNOSIS — R42 Dizziness and giddiness: Secondary | ICD-10-CM

## 2012-02-03 DIAGNOSIS — I6350 Cerebral infarction due to unspecified occlusion or stenosis of unspecified cerebral artery: Secondary | ICD-10-CM

## 2012-02-03 DIAGNOSIS — C9110 Chronic lymphocytic leukemia of B-cell type not having achieved remission: Secondary | ICD-10-CM

## 2012-02-03 DIAGNOSIS — C8300 Small cell B-cell lymphoma, unspecified site: Secondary | ICD-10-CM

## 2012-02-03 DIAGNOSIS — R0781 Pleurodynia: Secondary | ICD-10-CM

## 2012-02-03 LAB — MORPHOLOGY

## 2012-02-03 LAB — CBC WITH DIFFERENTIAL/PLATELET
Basophils Absolute: 0 10*3/uL (ref 0.0–0.1)
Eosinophils Absolute: 0.1 10*3/uL (ref 0.0–0.5)
HGB: 10.6 g/dL — ABNORMAL LOW (ref 11.6–15.9)
MCV: 109 fL — ABNORMAL HIGH (ref 79.5–101.0)
NEUT#: 1.2 10*3/uL — ABNORMAL LOW (ref 1.5–6.5)
RDW: 14 % (ref 11.2–14.5)
lymph#: 8.1 10*3/uL — ABNORMAL HIGH (ref 0.9–3.3)

## 2012-02-03 LAB — COMPREHENSIVE METABOLIC PANEL
AST: 22 U/L (ref 0–37)
Alkaline Phosphatase: 105 U/L (ref 39–117)
BUN: 21 mg/dL (ref 6–23)
Creatinine, Ser: 1 mg/dL (ref 0.50–1.10)

## 2012-02-03 NOTE — Progress Notes (Signed)
Hematology and Oncology Follow Up Visit  [name] [id number] July 23, 1934 78 y.o. 02/03/2012 8:28 PM   Principle Diagnosis: Encounter Diagnoses  Name Primary?  . Malignant lymphoma, low grade Yes  . Vertigo   . Blurred vision, right eye   . Pleuritic chest pain   . Low grade malignant lymphoma   . Stroke   . Benign essential HTN   . Macrocytic anemia      Interim History:   Extended office visit for this complicated 78 year old woman who initially presented in November 2009 with rapidly progressive pancytopenia with predominant thrombocytopenia as the first sign of a well-differentiated lymphocytic lymphoma. She had minimal adenopathy confined to the left axilla and subpectoral region. She had a mass in the left breast which proved to be DCIS. The axillary node showed a well-differentiated lymphocytic lymphoma. 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 Bone marrow had  diffuse and nodular lymphoid infiltrates. Decreased megakaryocytes. Positive immunohistochemical stain for coexpression of CD5 and CD20 which is a pattern more typical for CLL. Initial platelet count was 7000. She failed to respond to initial therapy with fludarabine Cytoxan and Rituxan or subsequent treatment with Campath anti-CD 52 antibody. She achieved a excellent and durable partial response to ofatumomab Suzan Nailer) given weekly between 01/22/2009 in 03/05/2009 then monthly between 04/07/2009 and 07/09/2009. Unfortunately, we are now seeing reactivation of her disease. Her platelet count has fallen over the last 5 months from 92,000 in January to 76,000 in March 2 to 59,000 today. Total white count is slowly rising from 5700 recorded in August 2012 to today's value of 10,000. Percent of neutrophils is falling from average of a 25% back in August to 12% today with a concomitant rise in lymphocyte percentage from 60% up to 80%.  She has developed some new neurologic symptoms. She had a mild stroke back in September of 2010. She is now having strokelike  symptoms again. 2 weeks ago she developed an episode of vertigo, ocular headache with pain over her eyes, dyspnea, pleuritic right chest discomfort, blurred vision in the right eye. She was evaluated at [facility] emergency department on April 19. Chest radiograph showed cardiomegaly and mild hyperinflation of the lungs. An electrocardiogram was unremarkable. A ventilation perfusion lung scan was low probability for pulmonary embolus. There was patchy uptake bilaterally most consistent with obstructive airway disease. Her right chest discomfort was felt to be musculoskeletal in nature. She was sent home on some Toradol tablets she was instructed to get a followup appointment with her primary care physician Dr. Della Goo but she did not do this. We were unaware of any of these changes until her routine visit today.  She is still getting some intermittent vertigo. She is getting a feeling that she has stuffiness in her right ear. Still some intermittent blurriness in the right eye. She has not been running any fevers. She is having dysuria and frequency but a urine culture showed no growth 12/20/2011.  She has not noted any swollen glands.  She is still getting some right chest discomfort on deep inspiration.    Medications: reviewed  Allergies:  Allergies  Allergen Reactions  . Latex   . Penicillins     Review of Systems: Constitutional:   See above Respiratory: Currently no dyspnea at rest no cough Cardiovascular:  No ischemic type chest pain, pressure, or palpitations Gastrointestinal: No abdominal pain, no change in bowel habit. Chronic constipation. Using Senokot. Genito-Urinary: See above Musculoskeletal: See above Neurologic: See above Skin: No rash or ecchymoses Remaining ROS  negative.  Physical Exam: Blood pressure 167/59, pulse 56, temperature 97.9 F (36.6 C), temperature source Oral, height 5' 4.5" (1.638 m), weight 255 lb (115.667 kg). Wt Readings  from Last 3 Encounters:  02/03/12 255 lb (115.667 kg)  10/10/11 254 lb 3.2 oz (115.304 kg)  09/18/11 258 lb 3.2 oz (117.119 kg)     General appearance: Pleasant, overweight, African American woman HENNT: Pharynx no erythema or exudate Lymph nodes: Approximate 2 cm lymph node palpable in the left axilla Breasts: Not examined today Lungs: Clear to auscultation resonant to percussion Heart: Regular rhythm no murmur or gallop Abdomen: Soft nontender no mass no organomegaly Extremities: No edema no calf tenderness Vascular: No cyanosis Neurologic: Mental status intact, cranial nerves grossly normal, pupils equal round reactive to light, optic disc sharp on the left vessels normal no hemorrhage or exudate. I could not get a good look at the right fundus. Cataract? Skin: No rash or ecchymosis  Lab Results: Lab Results  Component Value Date   WBC 10.1 02/03/2012   HGB 10.6* 02/03/2012   HCT 30.2* 02/03/2012   MCV 109.0* 02/03/2012   PLT 59* 02/03/2012     Chemistry      Component Value Date/Time   NA 139 02/03/2012 1010   K 4.1 02/03/2012 1010   CL 104 02/03/2012 1010   CO2 27 02/03/2012 1010   BUN 21 02/03/2012 1010   CREATININE 1.00 02/03/2012 1010      Component Value Date/Time   CALCIUM 9.6 02/03/2012 1010   ALKPHOS 105 02/03/2012 1010   AST 22 02/03/2012 1010   ALT 25 02/03/2012 1010   BILITOT 0.6 02/03/2012 1010       Radiological Studies: See discussion above   Impression and Plan: #1. Progression of lymphoma/leukemia. Plan: Complete restaging CT scan chest abdomen pelvis and a bone marrow biopsy. Given the refractory nature of her disease and the excellent response to the Arzerra I will likely put her back on Arzerra again on staging evaluation is completed. If this does not get her back into a good partial remission I would consider other agents including Revlimid or Velcade.  #2. Recurrent strokelike symptoms. Situation complicated by her leukemia, prior history of stroke, hypertension, and  history of invasive breast cancer. Plan: I'm going to get an urgent MRI of her brain to look for recurrent stroke versus metastatic tumor. Continue aspirin for now. She has stopped her Plavix on her own but due to her falling platelet count I will not resume it at this time until results of MRI available.  #3. 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 Initial left breast biopsy showing DCIS at time of lymphoma diagnosis. Definitive treatment deferred until her leukemia was under control then subsequent left mastectomy with sentinel lymph node dissection 10/09/2010. She was unable to tolerate hormonal therapy. Either with an aromatase inhibitor or tamoxifen  #4. Essential hypertension  #5. Cerebrovascular disease status post stroke September 2010. Suspicion for a recurrent event. See #2 above.  #6. Chronic nodular dermatitis   CC:. Dr. Garner Gavel, MD 5/7/20138:28 PM

## 2012-02-04 ENCOUNTER — Telehealth: Payer: Self-pay | Admitting: Oncology

## 2012-02-04 NOTE — Telephone Encounter (Signed)
Sent email message to [facility] re adding order for bmbx via IR. Referral entered w/referred to dept listed as Rad onc. Anne to schedule scans and bmbx when order entered.

## 2012-02-05 ENCOUNTER — Other Ambulatory Visit: Payer: Self-pay | Admitting: Oncology

## 2012-02-05 ENCOUNTER — Telehealth: Payer: Self-pay | Admitting: Oncology

## 2012-02-05 DIAGNOSIS — C8590 Non-Hodgkin lymphoma, unspecified, unspecified site: Secondary | ICD-10-CM

## 2012-02-05 NOTE — Telephone Encounter (Signed)
Per tiffany she has spoken with [facility]ra and her bmbx is 5/16

## 2012-02-05 NOTE — Telephone Encounter (Signed)
s/w daughter and she is aware of mri for 5/10 ct for 5/13 md visit for 5/17 and that rad. will be calling for bmbx visit   aom

## 2012-02-06 ENCOUNTER — Other Ambulatory Visit: Payer: Self-pay | Admitting: Physician Assistant

## 2012-02-06 ENCOUNTER — Ambulatory Visit (HOSPITAL_COMMUNITY)
Admission: RE | Admit: 2012-02-06 | Discharge: 2012-02-06 | Disposition: A | Discharge status: Home / Self Care | Payer: Medicare Other | Source: Ambulatory Visit | Attending: Oncology | Admitting: Oncology

## 2012-02-06 DIAGNOSIS — C8590 Non-Hodgkin lymphoma, unspecified, unspecified site: Secondary | ICD-10-CM

## 2012-02-06 DIAGNOSIS — C9590 Leukemia, unspecified not having achieved remission: Secondary | ICD-10-CM | POA: Insufficient documentation

## 2012-02-06 DIAGNOSIS — R29898 Other symptoms and signs involving the musculoskeletal system: Secondary | ICD-10-CM | POA: Insufficient documentation

## 2012-02-06 DIAGNOSIS — C8589 Other specified types of non-Hodgkin lymphoma, extranodal and solid organ sites: Secondary | ICD-10-CM | POA: Insufficient documentation

## 2012-02-06 DIAGNOSIS — H538 Other visual disturbances: Secondary | ICD-10-CM | POA: Insufficient documentation

## 2012-02-06 DIAGNOSIS — C50919 Malignant neoplasm of unspecified site of unspecified female breast: Secondary | ICD-10-CM

## 2012-02-06 DIAGNOSIS — R51 Headache: Secondary | ICD-10-CM | POA: Insufficient documentation

## 2012-02-06 DIAGNOSIS — I10 Essential (primary) hypertension: Secondary | ICD-10-CM | POA: Insufficient documentation

## 2012-02-06 DIAGNOSIS — I639 Cerebral infarction, unspecified: Secondary | ICD-10-CM

## 2012-02-06 DIAGNOSIS — H9190 Unspecified hearing loss, unspecified ear: Secondary | ICD-10-CM | POA: Insufficient documentation

## 2012-02-06 MED ORDER — GADOBENATE DIMEGLUMINE 529 MG/ML IV SOLN
20.0000 mL | Freq: Once | INTRAVENOUS | Status: AC | PRN
  Administered 2012-02-06: 20 mL via INTRAVENOUS

## 2012-02-09 ENCOUNTER — Other Ambulatory Visit (HOSPITAL_COMMUNITY): Payer: Medicare Other

## 2012-02-09 ENCOUNTER — Other Ambulatory Visit: Payer: Self-pay | Admitting: Radiology

## 2012-02-10 ENCOUNTER — Other Ambulatory Visit: Payer: Self-pay | Admitting: Oncology

## 2012-02-10 ENCOUNTER — Other Ambulatory Visit: Payer: Self-pay

## 2012-02-10 ENCOUNTER — Ambulatory Visit (HOSPITAL_COMMUNITY)
Admission: RE | Admit: 2012-02-10 | Discharge: 2012-02-10 | Disposition: A | Discharge status: Home / Self Care | Payer: Medicare Other | Source: Ambulatory Visit | Attending: Oncology | Admitting: Oncology

## 2012-02-10 DIAGNOSIS — C8589 Other specified types of non-Hodgkin lymphoma, extranodal and solid organ sites: Secondary | ICD-10-CM | POA: Insufficient documentation

## 2012-02-10 DIAGNOSIS — C8590 Non-Hodgkin lymphoma, unspecified, unspecified site: Secondary | ICD-10-CM

## 2012-02-10 DIAGNOSIS — I700 Atherosclerosis of aorta: Secondary | ICD-10-CM | POA: Insufficient documentation

## 2012-02-10 DIAGNOSIS — C50919 Malignant neoplasm of unspecified site of unspecified female breast: Secondary | ICD-10-CM

## 2012-02-10 DIAGNOSIS — I77811 Abdominal aortic ectasia: Secondary | ICD-10-CM | POA: Insufficient documentation

## 2012-02-10 DIAGNOSIS — I639 Cerebral infarction, unspecified: Secondary | ICD-10-CM

## 2012-02-10 DIAGNOSIS — Z9010 Acquired absence of unspecified breast and nipple: Secondary | ICD-10-CM | POA: Insufficient documentation

## 2012-02-10 DIAGNOSIS — R599 Enlarged lymph nodes, unspecified: Secondary | ICD-10-CM | POA: Insufficient documentation

## 2012-02-11 ENCOUNTER — Telehealth: Payer: Self-pay | Admitting: *Deleted

## 2012-02-11 ENCOUNTER — Other Ambulatory Visit: Payer: Self-pay | Admitting: Radiology

## 2012-02-11 NOTE — Telephone Encounter (Signed)
Message copied by Sabino Snipes on Wed Feb 11, 2012  4:08 PM ------      Message from: Levert Feinstein      Created: Sat Feb 07, 2012  1:28 PM       Call pt - no evidence for a new stroke

## 2012-02-11 NOTE — Telephone Encounter (Signed)
Pt notified of MRI results per Dr. Granfortuna.  

## 2012-02-12 ENCOUNTER — Ambulatory Visit (HOSPITAL_COMMUNITY)
Admission: RE | Admit: 2012-02-12 | Discharge: 2012-02-12 | Disposition: A | Discharge status: Home / Self Care | Payer: Medicare Other | Source: Ambulatory Visit | Attending: Oncology | Admitting: Oncology

## 2012-02-12 ENCOUNTER — Encounter (HOSPITAL_COMMUNITY): Payer: Self-pay

## 2012-02-12 DIAGNOSIS — C8589 Other specified types of non-Hodgkin lymphoma, extranodal and solid organ sites: Secondary | ICD-10-CM | POA: Insufficient documentation

## 2012-02-12 DIAGNOSIS — C8590 Non-Hodgkin lymphoma, unspecified, unspecified site: Secondary | ICD-10-CM

## 2012-02-12 LAB — CBC
MCHC: 34.7 g/dL (ref 30.0–36.0)
Platelets: 76 10*3/uL — ABNORMAL LOW (ref 150–400)
RDW: 14.5 % (ref 11.5–15.5)
WBC: 13.7 10*3/uL — ABNORMAL HIGH (ref 4.0–10.5)

## 2012-02-12 LAB — PROTIME-INR: INR: 0.99 (ref 0.00–1.49)

## 2012-02-12 LAB — APTT: aPTT: 32 seconds (ref 24–37)

## 2012-02-12 MED ORDER — FENTANYL CITRATE 0.05 MG/ML IJ SOLN
INTRAMUSCULAR | Status: AC
  Filled 2012-02-12: qty 4

## 2012-02-12 MED ORDER — MIDAZOLAM HCL 5 MG/5ML IJ SOLN
INTRAMUSCULAR | Status: AC | PRN
  Administered 2012-02-12: 2 mg via INTRAVENOUS

## 2012-02-12 MED ORDER — SODIUM CHLORIDE 0.9 % IV SOLN
INTRAVENOUS | Status: DC
  Administered 2012-02-12: 09:00:00 via INTRAVENOUS

## 2012-02-12 MED ORDER — FENTANYL CITRATE 0.05 MG/ML IJ SOLN
INTRAMUSCULAR | Status: AC | PRN
  Administered 2012-02-12: 100 ug via INTRAVENOUS

## 2012-02-12 MED ORDER — LIDOCAINE HCL 1 % IJ SOLN
INTRAMUSCULAR | Status: AC
  Filled 2012-02-12: qty 20

## 2012-02-12 MED ORDER — MIDAZOLAM HCL 2 MG/2ML IJ SOLN
INTRAMUSCULAR | Status: AC
  Filled 2012-02-12: qty 4

## 2012-02-12 NOTE — Procedures (Signed)
Successful RT ILIAC BM asp and core bx No comp Stable Full report in pacs

## 2012-02-12 NOTE — Discharge Instructions (Signed)
Biopsy  Care After  Refer to this sheet in the next few weeks. These instructions provide you with information on caring for yourself after your procedure. Your caregiver may also give you more specific instructions. Your treatment has been planned according to current medical practices, but problems sometimes occur. Call your caregiver if you have any problems or questions after your procedure.  If you had a fine needle biopsy, you may have soreness at the biopsy site for 1 to 2 days. If you had an open biopsy, you may have soreness at the biopsy site for 3 to 4 days.  HOME CARE INSTRUCTIONS    You may resume normal diet and activities as directed.   Change bandages (dressings) as directed. If your wound was closed with a skin glue (adhesive), it will wear off and begin to peel in 7 days.   Only take over-the-counter or prescription medicines for pain, discomfort, or fever as directed by your caregiver.   Ask your caregiver when you can bathe and get your wound wet.  SEEK IMMEDIATE MEDICAL CARE IF:    You have increased bleeding (more than a small spot) from the biopsy site.   You notice redness, swelling, or increasing pain at the biopsy site.   You have pus coming from the biopsy site.   You have a fever.   You notice a bad smell coming from the biopsy site or dressing.   You have a rash, have difficulty breathing, or have any allergic problems.  MAKE SURE YOU:    Understand these instructions.   Will watch your condition.   Will get help right away if you are not doing well or get worse.  Document Released: 04/04/2005 Document Revised: 09/04/2011 Document Reviewed: 03/13/2011  ExitCare Patient Information 2012 [employer].

## 2012-02-12 NOTE — H&P (Signed)
[name] is an 78 y.o. female.   Chief Complaint: Patient presents today for BM needle core biopsy for progression of lymphoma/leukemia.  HPI: See note below from recent oncology visit :    Related encounter: Office Visit from 02/03/2012 in [facility] MEDICAL ONCOLOGY  Hematology and Oncology Follow Up Visit  [name] [id number] [birth year] 78 y.o. 02/03/2012 8:28 PM   Principle Diagnosis: Encounter Diagnoses   Name  Primary?   .  Malignant lymphoma, low grade  Yes   .  Vertigo     .  Blurred vision, right eye     .  Pleuritic chest pain     .  Low grade malignant lymphoma     .  Stroke     .  Benign essential HTN     .  Macrocytic anemia        Interim History:    Extended office visit for this complicated 78 year old woman who initially presented in November 2009 with rapidly progressive pancytopenia with predominant thrombocytopenia as the first sign of a well-differentiated lymphocytic lymphoma. She had minimal adenopathy confined to the left axilla and subpectoral region. She had a mass in the left breast which proved to be DCIS. The axillary node showed a well-differentiated lymphocytic lymphoma. versus CLL. Bone marrow had  diffuse and nodular lymphoid infiltrates. Decreased megakaryocytes. Positive immunohistochemical stain for coexpression of CD5 and CD20 which is a pattern more typical for CLL. Initial platelet count was 7000. She failed to respond to initial therapy with fludarabine Cytoxan and Rituxan or subsequent treatment with Campath anti-CD 52 antibody. She achieved a excellent and durable partial response to ofatumomab Suzan Nailer) given weekly between 01/22/2009 in 03/05/2009 then monthly between 04/07/2009 and 07/09/2009. Unfortunately, we are now seeing reactivation of her disease. Her platelet count has fallen over the last 5 months from 92,000 in January to 76,000 in March 2 to 59,000 today. Total white count is slowly rising from 5700 recorded in August  2012 to today's value of 10,000. Percent of neutrophils is falling from average of a 25% back in August to 12% today with a concomitant rise in lymphocyte percentage from 60% up to 80%.  She has developed some new neurologic symptoms. She had a mild stroke back in September of 2010. She is now having strokelike symptoms again. 2 weeks ago she developed an episode of vertigo, ocular headache with pain over her eyes, dyspnea, pleuritic right chest discomfort, blurred vision in the right eye. She was evaluated at [facility] emergency department on April 19. Chest radiograph showed cardiomegaly and mild hyperinflation of the lungs. An electrocardiogram was unremarkable. A ventilation perfusion lung scan was low probability for pulmonary embolus. There was patchy uptake bilaterally most consistent with obstructive airway disease. Her right chest discomfort was felt to be musculoskeletal in nature. She was sent home on some Toradol tablets she was instructed to get a followup appointment with her primary care physician Dr. Della Goo but she did not do this. We were unaware of any of these changes until her routine visit today.  She is still getting some intermittent vertigo. She is getting a feeling that she has stuffiness in her right ear. Still some intermittent blurriness in the right eye. She has not been running any fevers. She is having dysuria and frequency but a urine culture showed no growth 12/20/2011.  She has not noted any swollen glands.  She is still getting some right chest discomfort on deep inspiration.  Medications: reviewed  Allergies:   Allergies   Allergen  Reactions   .  Latex     .  Penicillins       Review of Systems: Constitutional:   See above Respiratory: Currently no dyspnea at rest no cough Cardiovascular:  No ischemic type chest pain, pressure, or palpitations Gastrointestinal: No abdominal pain, no change in bowel habit. Chronic  constipation. Using Senokot. Genito-Urinary: See above Musculoskeletal: See above Neurologic: See above Skin: No rash or ecchymoses Remaining ROS negative.  Physical Exam: Blood pressure 167/59, pulse 56, temperature 97.9 F (36.6 C), temperature source Oral, height 5' 4.5" (1.638 m), weight 255 lb (115.667 kg). Wt Readings from Last 3 Encounters:   02/03/12  255 lb (115.667 kg)   10/10/11  254 lb 3.2 oz (115.304 kg)   09/18/11  258 lb 3.2 oz (117.119 kg)       General appearance: Pleasant, overweight, African American woman HENNT: Pharynx no erythema or exudate Lymph nodes: Approximate 2 cm lymph node palpable in the left axilla Breasts: Not examined today Lungs: Clear to auscultation resonant to percussion Heart: Regular rhythm no murmur or gallop Abdomen: Soft nontender no mass no organomegaly Extremities: No edema no calf tenderness Vascular: No cyanosis Neurologic: Mental status intact, cranial nerves grossly normal, pupils equal round reactive to light, optic disc sharp on the left vessels normal no hemorrhage or exudate. I could not get a good look at the right fundus. Cataract? Skin: No rash or ecchymosis  Lab Results: Lab Results   Component  Value  Date     WBC  10.1  02/03/2012     HGB  10.6*  02/03/2012     HCT  30.2*  02/03/2012     MCV  109.0*  02/03/2012     PLT  59*  02/03/2012      Chemistry        Component  Value  Date/Time     NA  139  02/03/2012 1010     K  4.1  02/03/2012 1010     CL  104  02/03/2012 1010     CO2  27  02/03/2012 1010     BUN  21  02/03/2012 1010     CREATININE  1.00  02/03/2012 1010        Component  Value  Date/Time     CALCIUM  9.6  02/03/2012 1010     ALKPHOS  105  02/03/2012 1010     AST  22  02/03/2012 1010     ALT  25  02/03/2012 1010     BILITOT  0.6  02/03/2012 1010         Radiological Studies: See discussion above   Impression and Plan: #1. Progression of lymphoma/leukemia. Plan: Complete restaging CT scan chest abdomen pelvis and a bone  marrow biopsy. Given the refractory nature of her disease and the excellent response to the Arzerra I will likely put her back on Arzerra again on staging evaluation is completed. If this does not get her back into a good partial remission I would consider other agents including Revlimid or Velcade.  #2. Recurrent strokelike symptoms. Situation complicated by her leukemia, prior history of stroke, hypertension, and history of invasive breast cancer. Plan: I'm going to get an urgent MRI of her brain to look for recurrent stroke versus metastatic tumor. Continue aspirin for now. She has stopped her Plavix on her own but due to her falling platelet count I will not resume  it at this time until results of MRI available.  #3. 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 Initial left breast biopsy showing DCIS at time of lymphoma diagnosis. Definitive treatment deferred until her leukemia was under control then subsequent left mastectomy with sentinel lymph node dissection 10/09/2010. She was unable to tolerate hormonal therapy. Either with an aromatase inhibitor or tamoxifen  #4. Essential hypertension  #5. Cerebrovascular disease status post stroke September 2010. Suspicion for a recurrent event. See #2 above.  #6. Chronic nodular dermatitis   CC:. Dr. Garner Gavel, MD 5/7/20138:28 PM         Past Medical History  Diagnosis Date  . 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 Stroke 02/03/2012    05/2009  Right upper extrem/leg weakness; nuchal pain;  . Benign essential HTN 02/03/2012  . Macrocytic anemia 02/03/2012    Past Surgical History  Procedure Date  . Tonsillectomy   . Abdominal  hysterectomy   . Right parotid gland   . Hemorrhoid surgery   . Breast biopsy     Social History:  reports that she has never smoked. She does not have any smokeless tobacco history on file. She reports that she does not drink alcohol. Her drug history not on file.  Allergies:  Allergies  Allergen Reactions  . Latex   . Penicillins      Review of Systems  Constitutional: Positive for malaise/fatigue. Negative for fever and chills.  Eyes: Positive for blurred vision.  Respiratory: Negative for cough, hemoptysis and shortness of breath.   Cardiovascular: Negative for chest pain and orthopnea.       Pleuritic chest wall pain on right    Gastrointestinal: Positive for constipation. Negative for abdominal pain.  Genitourinary: Positive for urgency and frequency.  Musculoskeletal: Positive for joint pain.  Neurological: Positive for dizziness and headaches. Negative for speech change, focal weakness, seizures and loss of consciousness.  Endo/Heme/Allergies: Does not bruise/bleed easily.  Psychiatric/Behavioral: Negative for depression. The patient is not nervous/anxious and does not have insomnia.     Blood pressure 178/66, pulse 56, temperature 97.5 F (36.4 C), temperature source Oral, resp. rate 18, height 5' 4.5" (1.638 m), weight 255 lb (115.667 kg), SpO2 100.00%. Physical Exam  Constitutional: She is oriented to person, place, and time. She appears well-developed and well-nourished.  HENT:  Head: Normocephalic and atraumatic.  Cardiovascular: Normal rate.  Exam reveals no gallop and no friction rub.   Murmur heard. Respiratory: Effort normal and breath sounds normal. No respiratory distress. She has no wheezes. She has no rales.  GI: Soft. Bowel sounds are normal.  Neurological: She is alert and oriented to person, place, and time.  Skin: Skin is warm and dry.  Psychiatric: She has a normal mood and affect. Her behavior is normal. Judgment and thought content normal.      Assessment/Plan Procedure details for bone marrow needle core biopsy discussed in detail with patient and daughter.  Potential complications including but not limited to infection, bleeding, inadequate sampling and complications with moderate sedation reviewed with their apparent understanding.  Written consent obtained.   [name] 02/12/2012, 8:20 AM

## 2012-02-13 ENCOUNTER — Telehealth: Payer: Self-pay | Admitting: Oncology

## 2012-02-13 ENCOUNTER — Encounter: Payer: Self-pay | Admitting: Oncology

## 2012-02-13 ENCOUNTER — Ambulatory Visit (HOSPITAL_BASED_OUTPATIENT_CLINIC_OR_DEPARTMENT_OTHER): Payer: Medicare Other | Admitting: Oncology

## 2012-02-13 ENCOUNTER — Other Ambulatory Visit: Payer: Self-pay

## 2012-02-13 VITALS — BP 192/76 | HR 63 | Temp 97.1°F | Ht 63.5 in | Wt 251.9 lb

## 2012-02-13 DIAGNOSIS — C9110 Chronic lymphocytic leukemia of B-cell type not having achieved remission: Secondary | ICD-10-CM

## 2012-02-13 DIAGNOSIS — C9192 Lymphoid leukemia, unspecified, in relapse: Secondary | ICD-10-CM | POA: Insufficient documentation

## 2012-02-13 DIAGNOSIS — R42 Dizziness and giddiness: Secondary | ICD-10-CM

## 2012-02-13 DIAGNOSIS — C9112 Chronic lymphocytic leukemia of B-cell type in relapse: Secondary | ICD-10-CM

## 2012-02-13 DIAGNOSIS — R091 Pleurisy: Secondary | ICD-10-CM | POA: Insufficient documentation

## 2012-02-13 MED ORDER — SULFAMETHOXAZOLE-TRIMETHOPRIM 800-160 MG PO TABS: 1.0000 | ORAL_TABLET | ORAL | Status: DC

## 2012-02-13 MED ORDER — ACYCLOVIR 400 MG PO TABS: 400.0000 mg | ORAL_TABLET | Freq: Every day | ORAL | Status: DC

## 2012-02-13 NOTE — Progress Notes (Signed)
Addended by: Albertha Ghee on: 02/13/2012 12:34 PM   Modules accepted: Orders

## 2012-02-13 NOTE — Telephone Encounter (Signed)
Gave pt appt calendar for May and June 2013, emailed Dr. Reece Agar regarding chemo and Md visit, per Marcelino Duster, chemo room is already close on 5/28, 29th and 30th, waiting for MD's answer. Called portacath and waiting for call back.

## 2012-02-13 NOTE — Progress Notes (Signed)
Hematology and Oncology Follow Up Visit  [name] [id number] April 08, 1934 78 y.o. 02/13/2012 11:15 AM   Principle Diagnosis: Encounter Diagnoses  Name Primary?  . CLL (chronic lymphoid leukemia) in relapse Yes  . Vertigo   . 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 Please see my detailed note from May 7 for full details. At time of her 5/7 visit, she had a number of different complaints. She had just had an evaluation for right pleuritic chest pain with a low probability ventilation perfusion lung scan. She was complaining of new onset vertigo. She had a history of a previous stroke. Lab done through our office showed recurrent progressive pancytopenia. Examination remarkable only for a single 2 cm lymph node palpable in the left axilla & changes from previous left mastectomy.  I obtained an MRI of the brain. This showed no new abnormalities and specifically no evidence for any acute stroke or hemorrhage. No meningeal enhancement. With the assistance of  radiology,  a bone marrow aspiration and biopsy was done yesterday and I personally reviewed the slides with the pathologist this morning. There is a diffuse infiltrate of small lymphocytes consistent with progressive CLL. Special studies pending but I don't think will change anything. CT scan of the chest abdomen and pelvis shows borderline by axillary lymphadenopathy similar in appearance and size as prior studies done in November of 2009. No splenomegaly.  She still having some residual a pleuritic right chest discomfort when she takes a deep breath but not otherwise. Her vertigo has essentially resolved. Etiology of both of these complaints remains unclear.   Medications: reviewed  Allergies:  Allergies  Allergen Reactions  . Latex   . Penicillins      Review of Systems: Constitutional:    Respiratory: See above Cardiovascular:   Gastrointestinal: Genito-Urinary:  Musculoskeletal: See above Neurologic: See above Skin: Remaining ROS negative.  Physical Exam: Blood pressure 192/76, pulse 63, temperature 97.1 F (36.2 C), temperature source Oral, height 5' 3.5" (1.613 m), weight 251 lb 14.4 oz (114.261 kg). Wt Readings from Last 3 Encounters:  02/13/12 251 lb 14.4 oz (114.261 kg)  02/12/12 255 lb (115.667 kg)  02/03/12 255 lb (115.667 kg)     General appearance: Pleasant obese African American woman HENNT: Pharynx no erythema or exudate Lymph nodes: Persistent 2-3 cm node palpable left axilla no other areas of adenopathy Breasts: Left mastectomy Lungs: Clear to auscultation resonant to percussion Heart: Regular rhythm 1-2/6 systolic murmur left sternal border Abdomen: Soft obese nontender no mass no organomegaly Extremities: No edema no calf tenderness Vascular: No cyanosis Neurologic: No focal deficit Skin: Chronic nodular dermatitis  Lab Results: Lab Results  Component Value Date   WBC 13.7* 02/12/2012   HGB 12.0 02/12/2012   HCT 34.6* 02/12/2012   MCV 114.2* 02/12/2012   PLT 76* 02/12/2012     Chemistry      Component Value Date/Time   NA 139 02/03/2012 1010   K 4.1 02/03/2012 1010   CL 104 02/03/2012 1010   CO2 27 02/03/2012 1010   BUN 21 02/03/2012 1010   CREATININE 1.00 02/03/2012 1010      Component Value Date/Time   CALCIUM 9.6 02/03/2012 1010   ALKPHOS 105 02/03/2012 1010   AST 22 02/03/2012 1010   ALT 25 02/03/2012 1010   BILITOT 0.6 02/03/2012 1010  Radiological Studies: See discussion above    Impression and Plan: #1. Relapsed chronic lymphocytic leukemia/well-differentiated lymphocytic lymphoma I'm going to put her back on a program of Arzerra. She will receive weekly doses x8 and then  maintenance doses every other month x4 doses. She has exhausted vascular access so I will have interventional  radiology place a Port-A-Cath infusion device next week and plan on starting her first dose of the Arzerra on Tuesday, May 28.  #2. Vertigo. Negative MRI but can't rule out possibility that she had another TIA in the vertebrobasilar artery distribution. She'll remain on aspirin. I'm not adding another antiplatelet agent in view of her falling platelet count.  #3. Unexplained right pleuritic chest discomfort. Resolving on its own. No dyspnea. Low probability lung scan for pulmonary embolus. Hopefully this was just musculoskeletal.  #4. 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  #5. Cerebrovascular disease with history of a stroke in September 2010.  #6. Chronic nodular dermatitis.  #7. Essential hypertension. She will review her medications with her primary care physician   CC:. Dr. Garner Gavel, MD 5/17/201311:15 AM

## 2012-02-13 NOTE — Telephone Encounter (Signed)
Gave pt appt for May and June 2013, called [facility] @ IR they will call pt. Emailed Marcelino Duster regarding chemo

## 2012-02-16 ENCOUNTER — Ambulatory Visit: Payer: Medicare Other | Admitting: Oncology

## 2012-02-16 ENCOUNTER — Other Ambulatory Visit: Payer: Self-pay | Admitting: Physician Assistant

## 2012-02-18 ENCOUNTER — Telehealth (HOSPITAL_COMMUNITY): Payer: Self-pay | Admitting: Oncology

## 2012-02-18 ENCOUNTER — Other Ambulatory Visit: Payer: Self-pay | Admitting: Physician Assistant

## 2012-02-19 ENCOUNTER — Other Ambulatory Visit: Payer: Self-pay | Admitting: Oncology

## 2012-02-19 ENCOUNTER — Ambulatory Visit (HOSPITAL_COMMUNITY)
Admission: RE | Admit: 2012-02-19 | Discharge: 2012-02-19 | Disposition: A | Discharge status: Home / Self Care | Payer: Medicare Other | Source: Ambulatory Visit | Attending: Oncology | Admitting: Oncology

## 2012-02-19 ENCOUNTER — Other Ambulatory Visit (HOSPITAL_COMMUNITY): Payer: Self-pay | Admitting: Diagnostic Radiology

## 2012-02-19 ENCOUNTER — Ambulatory Visit (HOSPITAL_COMMUNITY)
Admission: RE | Admit: 2012-02-19 | Discharge: 2012-02-19 | Disposition: A | Discharge status: Home / Self Care | Payer: Medicare Other | Source: Ambulatory Visit | Attending: Diagnostic Radiology | Admitting: Diagnostic Radiology

## 2012-02-19 ENCOUNTER — Telehealth: Payer: Self-pay | Admitting: *Deleted

## 2012-02-19 DIAGNOSIS — C9192 Lymphoid leukemia, unspecified, in relapse: Secondary | ICD-10-CM

## 2012-02-19 DIAGNOSIS — C9110 Chronic lymphocytic leukemia of B-cell type not having achieved remission: Secondary | ICD-10-CM | POA: Insufficient documentation

## 2012-02-19 DIAGNOSIS — Z79899 Other long term (current) drug therapy: Secondary | ICD-10-CM | POA: Insufficient documentation

## 2012-02-19 DIAGNOSIS — I10 Essential (primary) hypertension: Secondary | ICD-10-CM | POA: Insufficient documentation

## 2012-02-19 DIAGNOSIS — Z8673 Personal history of transient ischemic attack (TIA), and cerebral infarction without residual deficits: Secondary | ICD-10-CM | POA: Insufficient documentation

## 2012-02-19 DIAGNOSIS — C50912 Malignant neoplasm of unspecified site of left female breast: Secondary | ICD-10-CM

## 2012-02-19 LAB — CBC
Hemoglobin: 10.6 g/dL — ABNORMAL LOW (ref 12.0–15.0)
MCH: 39.1 pg — ABNORMAL HIGH (ref 26.0–34.0)
MCHC: 34.6 g/dL (ref 30.0–36.0)
MCV: 112.9 fL — ABNORMAL HIGH (ref 78.0–100.0)
RBC: 2.71 MIL/uL — ABNORMAL LOW (ref 3.87–5.11)

## 2012-02-19 LAB — PROTIME-INR: Prothrombin Time: 13.2 seconds (ref 11.6–15.2)

## 2012-02-19 MED ORDER — LIDOCAINE HCL 1 % IJ SOLN
INTRAMUSCULAR | Status: AC
  Filled 2012-02-19: qty 20

## 2012-02-19 MED ORDER — MIDAZOLAM HCL 5 MG/5ML IJ SOLN
INTRAMUSCULAR | Status: AC | PRN
  Administered 2012-02-19 (×2): 2 mg via INTRAVENOUS

## 2012-02-19 MED ORDER — DIPHENHYDRAMINE HCL 50 MG/ML IJ SOLN
INTRAMUSCULAR | Status: AC
  Filled 2012-02-19: qty 1

## 2012-02-19 MED ORDER — MIDAZOLAM HCL 2 MG/2ML IJ SOLN
INTRAMUSCULAR | Status: AC
  Filled 2012-02-19: qty 4

## 2012-02-19 MED ORDER — FENTANYL CITRATE 0.05 MG/ML IJ SOLN
INTRAMUSCULAR | Status: AC
  Filled 2012-02-19: qty 4

## 2012-02-19 MED ORDER — VANCOMYCIN HCL 1000 MG IV SOLR
2000.0000 mg | Freq: Once | INTRAVENOUS | Status: AC
  Administered 2012-02-19: 2000 mg via INTRAVENOUS
  Filled 2012-02-19: qty 2000

## 2012-02-19 MED ORDER — DIPHENHYDRAMINE HCL 50 MG/ML IJ SOLN
25.0000 mg | Freq: Once | INTRAMUSCULAR | Status: AC
  Administered 2012-02-19: 25 mg via INTRAVENOUS

## 2012-02-19 MED ORDER — FENTANYL CITRATE 0.05 MG/ML IJ SOLN
INTRAMUSCULAR | Status: AC | PRN
  Administered 2012-02-19 (×2): 100 ug via INTRAVENOUS

## 2012-02-19 MED ORDER — HEPARIN SOD (PORK) LOCK FLUSH 100 UNIT/ML IV SOLN
500.0000 [IU] | Freq: Once | INTRAVENOUS | Status: AC
  Administered 2012-02-19: 500 [IU] via INTRAVENOUS

## 2012-02-19 MED ORDER — SODIUM CHLORIDE 0.9 % IV SOLN
INTRAVENOUS | Status: DC
  Administered 2012-02-19 (×2): via INTRAVENOUS

## 2012-02-19 NOTE — Discharge Instructions (Signed)

## 2012-02-19 NOTE — ED Notes (Signed)
Patient is resting comfortably. 

## 2012-02-19 NOTE — Procedures (Signed)
Placement of right jugular port.  Tip near cavoatrial junction.  No immediate complication.

## 2012-02-19 NOTE — H&P (Signed)
[name] is an 78 y.o. female.   Chief Complaint:" I'm here for my port a cath" HPI: Patient with history of relapsing CLL/well differentiated lymphocytic lymphoma presents today for port a cath placement for chemotherapy.  Past Medical History  Diagnosis Date  . 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 Stroke 02/03/2012    05/2009  Right upper extrem/leg weakness; nuchal pain;  . Benign essential HTN 02/03/2012  . Macrocytic anemia 02/03/2012  . Vertigo 02/13/2012    Transient  MRI brain 5/13  No new CVA  . Pleurisy 02/13/2012    4/19 pleuritic right chest pain  V/Q scan [facility] low probability PE    Past Surgical History  Procedure Date  . Tonsillectomy   . Abdominal hysterectomy   . Right parotid gland   . Hemorrhoid surgery   . Breast biopsy     No family history on file. Social History:  reports that she has never smoked. She does not have any smokeless tobacco history on file. She reports that she does not drink alcohol. Her drug history not on file.  Allergies:  Allergies  Allergen Reactions  . Latex   . Penicillins     Current outpatient prescriptions:acetaminophen (TYLENOL) 500 MG tablet, Take 500 mg by mouth every 6 (six) hours as needed. For pain, Disp: , Rfl: ;  acyclovir (ZOVIRAX) 400 MG tablet, Take 1 tablet (400 mg total) by mouth daily., Disp: 30 tablet, Rfl: 6;  aspirin 325 MG EC tablet, Take 325 mg by mouth daily., Disp: , Rfl: ;  atenolol (TENORMIN) 50 MG tablet, , Disp: , Rfl:  calcium carbonate (TUMS - DOSED IN MG ELEMENTAL CALCIUM) 500 MG chewable tablet, Chew 1 tablet by mouth as needed. For stomach, Disp: , Rfl: ;  cloNIDine (CATAPRES) 0.1 MG tablet, , Disp: , Rfl: ;  clotrimazole (MYCELEX) 10 MG troche, Take 10 mg by mouth as needed., Disp: , Rfl: ;  donepezil (ARICEPT) 5 MG tablet, Take 5 mg by mouth at bedtime., Disp: , Rfl: ;  folic  acid (FOLVITE) 1 MG tablet, Take 1 mg by mouth daily., Disp: , Rfl:  gabapentin (NEURONTIN) 300 MG capsule, 300 mg daily. , Disp: , Rfl: ;  hydrochlorothiazide (MICROZIDE) 12.5 MG capsule, Take 1 capsule (12.5 mg total) by mouth daily., Disp: 90 capsule, Rfl: 0;  pantoprazole (PROTONIX) 40 MG tablet, Take 40 mg by mouth daily., Disp: , Rfl: ;  prochlorperazine (COMPAZINE) 10 MG tablet, Take 10 mg by mouth every 6 (six) hours as needed. For nausea, Disp: , Rfl:  sulfamethoxazole-trimethoprim (BACTRIM DS,SEPTRA DS) 800-160 MG per tablet, Take 1 tablet by mouth every Monday, Wednesday, and Friday., Disp: 36 tablet, Rfl: 1 Current facility-administered medications:0.9 %  sodium chloride infusion, , Intravenous, Continuous, Dayne Oley Balm III, MD;  lidocaine (XYLOCAINE) 1 % (with pres) injection, , , , ;  vancomycin (VANCOCIN) 2,000 mg in sodium chloride 0.9 % 500 mL IVPB, 2,000 mg, Intravenous, Once, Abundio Miu, MD   Results for orders placed during the hospital encounter of 02/19/12 (from the past 48 hour(s))  CBC     Status: Abnormal (Preliminary result)   Collection Time   02/19/12  9:00 AM      Component Value Range Comment   WBC 12.2 (*) 4.0 - 10.5 (K/uL)    RBC 2.71 (*) 3.87 - 5.11 (MIL/uL)    Hemoglobin 10.6 (*) 12.0 -  15.0 (g/dL)    HCT 16.1 (*) 09.6 - 46.0 (%)    MCV 112.9 (*) 78.0 - 100.0 (fL)    MCH 39.1 (*) 26.0 - 34.0 (pg)    MCHC 34.6  30.0 - 36.0 (g/dL)    RDW 04.5  40.9 - 81.1 (%)    Platelets PENDING  150 - 400 (K/uL)    Results for orders placed during the hospital encounter of 02/19/12  APTT      Component Value Range   aPTT 33  24 - 37 (seconds)  CBC      Component Value Range   WBC 12.2 (*) 4.0 - 10.5 (K/uL)   RBC 2.71 (*) 3.87 - 5.11 (MIL/uL)   Hemoglobin 10.6 (*) 12.0 - 15.0 (g/dL)   HCT 91.4 (*) 78.2 - 46.0 (%)   MCV 112.9 (*) 78.0 - 100.0 (fL)   MCH 39.1 (*) 26.0 - 34.0 (pg)   MCHC 34.6  30.0 - 36.0 (g/dL)   RDW 95.6  21.3 - 08.6 (%)   Platelets  PENDING  150 - 400 (K/uL)  PROTIME-INR      Component Value Range   Prothrombin Time 13.2  11.6 - 15.2 (seconds)   INR 0.98  0.00 - 1.49     Review of Systems  Constitutional: Negative for fever and chills.  Respiratory: Positive for shortness of breath. Negative for cough.   Cardiovascular: Negative for chest pain.  Gastrointestinal: Negative for nausea, vomiting and abdominal pain.  Musculoskeletal: Negative for back pain.  Neurological: Positive for headaches.  Endo/Heme/Allergies: Does not bruise/bleed easily.    Blood pressure 205/83, pulse 67, temperature 97.9 F (36.6 C), resp. rate 16, height 5' 3.5" (1.613 m), weight 251 lb (113.853 kg), SpO2 100.00%. Physical Exam  Constitutional: She appears well-developed and well-nourished.  Cardiovascular: Normal rate and regular rhythm.   Murmur heard. Respiratory: Effort normal and breath sounds normal.  GI: Soft. Bowel sounds are normal. There is no tenderness.       obese  Musculoskeletal: Normal range of motion. She exhibits no edema.     Assessment/Plan Patient with relapsing CLL/well differentiated lymphocytic lymphoma; plan is for port a cath placement for chemotherapy. Details/risks of procedure d/w pt/daughter with their understanding and consent.  Aivah Putman,D KEVIN 02/19/2012, 9:37 AM

## 2012-02-19 NOTE — Telephone Encounter (Signed)
Verbal order to Dr. Lowella Dandy that Dr. Cyndie Chime said OK to use heparin in [facility].

## 2012-02-19 NOTE — Telephone Encounter (Signed)
IR calling-patient having PAC placed today and telling staff she can't have heparin in her port. Called to determine if this was accurate. Patient has no allergy noted to heparin. Spoke with Dr. Michelene Heady to use heparin for PAC flush. He sees no reason not to.

## 2012-02-20 ENCOUNTER — Ambulatory Visit (HOSPITAL_BASED_OUTPATIENT_CLINIC_OR_DEPARTMENT_OTHER): Payer: Medicare Other

## 2012-02-20 ENCOUNTER — Other Ambulatory Visit: Payer: Self-pay | Admitting: *Deleted

## 2012-02-20 ENCOUNTER — Other Ambulatory Visit: Payer: Self-pay | Admitting: Oncology

## 2012-02-20 VITALS — BP 188/80 | HR 76 | Temp 97.7°F

## 2012-02-20 DIAGNOSIS — C9192 Lymphoid leukemia, unspecified, in relapse: Secondary | ICD-10-CM

## 2012-02-20 DIAGNOSIS — C8590 Non-Hodgkin lymphoma, unspecified, unspecified site: Secondary | ICD-10-CM

## 2012-02-20 DIAGNOSIS — Z5111 Encounter for antineoplastic chemotherapy: Secondary | ICD-10-CM

## 2012-02-20 DIAGNOSIS — C9112 Chronic lymphocytic leukemia of B-cell type in relapse: Secondary | ICD-10-CM

## 2012-02-20 MED ORDER — METHYLPREDNISOLONE SODIUM SUCC 125 MG IJ SOLR
125.0000 mg | Freq: Once | INTRAMUSCULAR | Status: AC
  Administered 2012-02-20: 125 mg via INTRAVENOUS

## 2012-02-20 MED ORDER — SODIUM CHLORIDE 0.9 % IJ SOLN
10.0000 mL | INTRAMUSCULAR | Status: DC | PRN
  Administered 2012-02-20: 10 mL
  Filled 2012-02-20: qty 10

## 2012-02-20 MED ORDER — SODIUM CHLORIDE 0.9 % IV SOLN
Freq: Once | INTRAVENOUS | Status: AC
  Administered 2012-02-20: 09:00:00 via INTRAVENOUS

## 2012-02-20 MED ORDER — DIPHENHYDRAMINE HCL 25 MG PO CAPS
50.0000 mg | ORAL_CAPSULE | Freq: Once | ORAL | Status: AC
  Administered 2012-02-20: 50 mg via ORAL

## 2012-02-20 MED ORDER — POTASSIUM CHLORIDE 20 MEQ/15ML (10%) PO SOLN
20.0000 meq | Freq: Every day | ORAL | Status: DC
  Administered 2012-02-20: 20 meq via ORAL

## 2012-02-20 MED ORDER — ACETAMINOPHEN 500 MG PO TABS
1000.0000 mg | ORAL_TABLET | Freq: Once | ORAL | Status: AC
Start: 2012-02-20 — End: 2012-02-20
  Administered 2012-02-20: 1000 mg via ORAL

## 2012-02-20 MED ORDER — FAMOTIDINE IN NACL 20-0.9 MG/50ML-% IV SOLN
20.0000 mg | Freq: Once | INTRAVENOUS | Status: AC
  Administered 2012-02-20: 20 mg via INTRAVENOUS

## 2012-02-20 MED ORDER — FUROSEMIDE 10 MG/ML IJ SOLN
20.0000 mg | Freq: Once | INTRAMUSCULAR | Status: AC
  Administered 2012-02-20: 20 mg via INTRAVENOUS

## 2012-02-20 MED ORDER — METHYLPREDNISOLONE SODIUM SUCC 125 MG IJ SOLR
80.0000 mg | Freq: Once | INTRAMUSCULAR | Status: AC
  Administered 2012-02-20: 80 mg via INTRAVENOUS

## 2012-02-20 MED ORDER — SODIUM CHLORIDE 0.9 % IV SOLN
300.0000 mg | Freq: Once | INTRAVENOUS | Status: AC
  Administered 2012-02-20: 300 mg via INTRAVENOUS
  Filled 2012-02-20: qty 15

## 2012-02-20 MED ORDER — HEPARIN SOD (PORK) LOCK FLUSH 100 UNIT/ML IV SOLN
500.0000 [IU] | Freq: Once | INTRAVENOUS | Status: AC | PRN
  Administered 2012-02-20: 500 [IU]
  Filled 2012-02-20: qty 5

## 2012-02-20 NOTE — Progress Notes (Signed)
1150- Pt c/o itching.  She has area of redness on her chest, she reports itching in that area.  She also notes itching around her right eye and forehead.  She has redness in those areas as well.  Arzerra stopped.  IVF wide open to gravity. Stephanie to office side to get Dr. Marlena Clipper.  VS stable, pt denies any ShOB. 1155- Pt continues to c/o worsening itching.  Redness has spread across chest, multiple red patches.  She reports itching on her back as well.  She has swelling around her eyes, redness around both eyes and by both sides of her nose.  IVF continues to infuse.  She denies any ShOB. 1156- Cool cloth to pt's face and forehead per request.  1157- Dr. Cyndie Chime at chair side to assess patient.  New orders received, medications to be administered as ordered.  Per Dr. Cyndie Chime, as long as symptoms resolve, will restart Arzerra.  If symptoms do not resolve, will readdress with MD.  See [facility] for medication administration of Benadryl 50mg  PO, Solumedrol 80mg  IV and Pepcid 20mg  IV. 1210- Pt reports feeling better, itching has resolved.  Chest continues to have areas of redness, no itching per patient.  Facial redness has improved.  IVF running at 197ml/hr-dhp, rn 1230- Pt denies complaints.  Redness continues to improve.  Facial redness and swelling have resolved. 1310- Pt denies complaints.  Redness and itching have resolved.  Will restart Arzerra.  See MAR/Doc flowsheet for details-dhp, rn

## 2012-02-20 NOTE — Patient Instructions (Signed)
[facility] Discharge Instructions for Patients Receiving Chemotherapy  Today you received the following chemotherapy agents Azerra To help prevent nausea and vomiting after your treatment, we encourage you to take your nausea medication as prescribed.  If you develop nausea and vomiting that is not controlled by your nausea medication, call the clinic. If it is after clinic hours your family physician or the after hours number for the clinic or go to the Emergency Department.   BELOW ARE SYMPTOMS THAT SHOULD BE REPORTED IMMEDIATELY:  *FEVER GREATER THAN 100.5 F  *CHILLS WITH OR WITHOUT FEVER  NAUSEA AND VOMITING THAT IS NOT CONTROLLED WITH YOUR NAUSEA MEDICATION  *UNUSUAL SHORTNESS OF BREATH  *UNUSUAL BRUISING OR BLEEDING  TENDERNESS IN MOUTH AND THROAT WITH OR WITHOUT PRESENCE OF ULCERS  *URINARY PROBLEMS  *BOWEL PROBLEMS  UNUSUAL RASH Items with * indicate a potential emergency and should be followed up as soon as possible.  One of the nurses will contact you 24 hours after your treatment. Please let the nurse know about any problems that you may have experienced. Feel free to call the clinic you have any questions or concerns. The clinic phone number is [phone].   I have been informed and understand all the instructions given to me. I know to contact the clinic, my physician, or go to the Emergency Department if any problems should occur. I do not have any questions at this time, but understand that I may call the clinic during office hours   should I have any questions or need assistance in obtaining follow up care.    __________________________________________  _____________  __________ Signature of Patient or Authorized Representative            Date                   Time    __________________________________________ Nurse's Signature    

## 2012-02-20 NOTE — Procedures (Signed)
US guided placement of right basilic vein 4 french micropuncture catheter. No immediate complications.

## 2012-02-24 ENCOUNTER — Other Ambulatory Visit (HOSPITAL_BASED_OUTPATIENT_CLINIC_OR_DEPARTMENT_OTHER): Payer: Medicare Other

## 2012-02-24 ENCOUNTER — Other Ambulatory Visit: Payer: Self-pay

## 2012-02-24 ENCOUNTER — Telehealth: Payer: Self-pay | Admitting: *Deleted

## 2012-02-24 DIAGNOSIS — D539 Nutritional anemia, unspecified: Secondary | ICD-10-CM

## 2012-02-24 DIAGNOSIS — C8590 Non-Hodgkin lymphoma, unspecified, unspecified site: Secondary | ICD-10-CM

## 2012-02-24 DIAGNOSIS — C9590 Leukemia, unspecified not having achieved remission: Secondary | ICD-10-CM

## 2012-02-24 LAB — CBC & DIFF AND RETIC
Basophils Absolute: 0 10*3/uL (ref 0.0–0.1)
EOS%: 1.1 % (ref 0.0–7.0)
Eosinophils Absolute: 0.1 10*3/uL (ref 0.0–0.5)
HCT: 30.5 % — ABNORMAL LOW (ref 34.8–46.6)
HGB: 10.5 g/dL — ABNORMAL LOW (ref 11.6–15.9)
LYMPH%: 83.7 % — ABNORMAL HIGH (ref 14.0–49.7)
MCH: 38.3 pg — ABNORMAL HIGH (ref 25.1–34.0)
MCV: 111.3 fL — ABNORMAL HIGH (ref 79.5–101.0)
MONO%: 4.3 % (ref 0.0–14.0)
NEUT#: 1.1 10*3/uL — ABNORMAL LOW (ref 1.5–6.5)
NEUT%: 10.8 % — ABNORMAL LOW (ref 38.4–76.8)
Platelets: 59 10*3/uL — ABNORMAL LOW (ref 145–400)
Retic Ct Abs: 75.62 10*3/uL (ref 33.70–90.70)

## 2012-02-24 LAB — MORPHOLOGY: PLT EST: DECREASED

## 2012-02-24 LAB — LACTATE DEHYDROGENASE: LDH: 184 U/L (ref 94–250)

## 2012-02-24 LAB — CHCC SMEAR

## 2012-02-24 LAB — VITAMIN B12: Vitamin B-12: 411 pg/mL (ref 211–911)

## 2012-02-24 MED ORDER — LIDOCAINE-PRILOCAINE 2.5-2.5 % EX CREA: TOPICAL_CREAM | CUTANEOUS | Status: DC | PRN

## 2012-02-24 NOTE — Telephone Encounter (Signed)
Patient's daughter [name] came in office regarding the office visit for 6/11. Both patient's daughters will be out of the office. Need to know if appts can be moved to June 18th. Please call [name] at [phone].   JMW

## 2012-02-26 ENCOUNTER — Telehealth: Payer: Self-pay

## 2012-02-26 NOTE — Telephone Encounter (Signed)
Per LCT, ok to move all appts on 6/11 to 6/18 if she has availability.   Message left on VM of Marcelino Duster, scheduler with this information, along with contact information for Roland Rack, pt's daughter.  Ms Fayrene Fearing - [phone]. dph

## 2012-02-27 ENCOUNTER — Telehealth: Payer: Self-pay | Admitting: *Deleted

## 2012-02-27 ENCOUNTER — Ambulatory Visit: Payer: Medicare Other

## 2012-02-27 NOTE — Telephone Encounter (Signed)
Patient's daughter called back and was given appts.  JMW

## 2012-02-27 NOTE — Telephone Encounter (Signed)
Per voicemail message from desk RN, ok to cancel appts for 6/11 and move out one week. Appts moved and I have called and left message with daughter to call me back.  JMW

## 2012-03-01 ENCOUNTER — Other Ambulatory Visit: Payer: Self-pay | Admitting: Oncology

## 2012-03-02 ENCOUNTER — Ambulatory Visit (HOSPITAL_BASED_OUTPATIENT_CLINIC_OR_DEPARTMENT_OTHER): Payer: Medicare Other

## 2012-03-02 ENCOUNTER — Other Ambulatory Visit (HOSPITAL_BASED_OUTPATIENT_CLINIC_OR_DEPARTMENT_OTHER): Payer: Medicare Other

## 2012-03-02 ENCOUNTER — Other Ambulatory Visit: Payer: Self-pay | Admitting: *Deleted

## 2012-03-02 VITALS — BP 150/65 | HR 64 | Temp 98.1°F

## 2012-03-02 DIAGNOSIS — C9192 Lymphoid leukemia, unspecified, in relapse: Secondary | ICD-10-CM

## 2012-03-02 DIAGNOSIS — Z5111 Encounter for antineoplastic chemotherapy: Secondary | ICD-10-CM

## 2012-03-02 DIAGNOSIS — C9112 Chronic lymphocytic leukemia of B-cell type in relapse: Secondary | ICD-10-CM

## 2012-03-02 DIAGNOSIS — C8590 Non-Hodgkin lymphoma, unspecified, unspecified site: Secondary | ICD-10-CM

## 2012-03-02 LAB — CBC WITH DIFFERENTIAL/PLATELET
EOS%: 0.7 % (ref 0.0–7.0)
MCH: 38.5 pg — ABNORMAL HIGH (ref 25.1–34.0)
MCHC: 34.6 g/dL (ref 31.5–36.0)
MCV: 111.5 fL — ABNORMAL HIGH (ref 79.5–101.0)
MONO%: 2 % (ref 0.0–14.0)
RBC: 2.7 10*6/uL — ABNORMAL LOW (ref 3.70–5.45)
RDW: 14.8 % — ABNORMAL HIGH (ref 11.2–14.5)
nRBC: 0 % (ref 0–0)

## 2012-03-02 MED ORDER — ACETAMINOPHEN 500 MG PO TABS
1000.0000 mg | ORAL_TABLET | Freq: Once | ORAL | Status: AC
  Administered 2012-03-02: 1000 mg via ORAL

## 2012-03-02 MED ORDER — METHYLPREDNISOLONE SODIUM SUCC 125 MG IJ SOLR
125.0000 mg | Freq: Once | INTRAMUSCULAR | Status: AC
  Administered 2012-03-02: 125 mg via INTRAVENOUS

## 2012-03-02 MED ORDER — SODIUM CHLORIDE 0.9 % IV SOLN
Freq: Once | INTRAVENOUS | Status: AC
  Administered 2012-03-02: 10:00:00 via INTRAVENOUS

## 2012-03-02 MED ORDER — SODIUM CHLORIDE 0.9 % IV SOLN
2000.0000 mg | Freq: Once | INTRAVENOUS | Status: AC
  Administered 2012-03-02: 2000 mg via INTRAVENOUS
  Filled 2012-03-02: qty 100

## 2012-03-02 MED ORDER — DIPHENHYDRAMINE HCL 25 MG PO CAPS
50.0000 mg | ORAL_CAPSULE | Freq: Once | ORAL | Status: AC
  Administered 2012-03-02: 50 mg via ORAL

## 2012-03-02 NOTE — Patient Instructions (Signed)
[facility] Discharge Instructions for Patients Receiving Chemotherapy  Today you received the following chemotherapy agents Arzerra.  To help prevent nausea and vomiting after your treatment, we encourage you to take your nausea medication as ordered per MD.    If you develop nausea and vomiting that is not controlled by your nausea medication, call the clinic. If it is after clinic hours your family physician or the after hours number for the clinic or go to the Emergency Department.   BELOW ARE SYMPTOMS THAT SHOULD BE REPORTED IMMEDIATELY:  *FEVER GREATER THAN 100.5 F  *CHILLS WITH OR WITHOUT FEVER  NAUSEA AND VOMITING THAT IS NOT CONTROLLED WITH YOUR NAUSEA MEDICATION  *UNUSUAL SHORTNESS OF BREATH  *UNUSUAL BRUISING OR BLEEDING  TENDERNESS IN MOUTH AND THROAT WITH OR WITHOUT PRESENCE OF ULCERS  *URINARY PROBLEMS  *BOWEL PROBLEMS  UNUSUAL RASH Items with * indicate a potential emergency and should be followed up as soon as possible.  Please let the nurse know about any problems that you may have experienced. Feel free to call the clinic you have any questions or concerns. The clinic phone number is [phone].   I have been informed and understand all the instructions given to me. I know to contact the clinic, my physician, or go to the Emergency Department if any problems should occur. I do not have any questions at this time, but understand that I may call the clinic during office hours   should I have any questions or need assistance in obtaining follow up care.    __________________________________________  _____________  __________ Signature of Patient or Authorized Representative            Date                   Time    __________________________________________ Nurse's Signature    

## 2012-03-08 ENCOUNTER — Other Ambulatory Visit: Payer: Self-pay | Admitting: Certified Registered Nurse Anesthetist

## 2012-03-09 ENCOUNTER — Ambulatory Visit: Payer: Medicare Other

## 2012-03-09 ENCOUNTER — Other Ambulatory Visit: Payer: Medicare Other | Admitting: Lab

## 2012-03-09 ENCOUNTER — Ambulatory Visit: Payer: Medicare Other | Admitting: Nurse Practitioner

## 2012-03-10 ENCOUNTER — Other Ambulatory Visit: Payer: Self-pay | Admitting: Oncology

## 2012-03-15 ENCOUNTER — Ambulatory Visit (HOSPITAL_BASED_OUTPATIENT_CLINIC_OR_DEPARTMENT_OTHER): Payer: Medicare Other | Admitting: Oncology

## 2012-03-15 ENCOUNTER — Telehealth: Payer: Self-pay | Admitting: Oncology

## 2012-03-15 ENCOUNTER — Other Ambulatory Visit (HOSPITAL_BASED_OUTPATIENT_CLINIC_OR_DEPARTMENT_OTHER): Payer: Medicare Other | Admitting: Lab

## 2012-03-15 ENCOUNTER — Encounter: Payer: Self-pay | Admitting: Oncology

## 2012-03-15 ENCOUNTER — Ambulatory Visit: Payer: Medicare Other | Admitting: Nurse Practitioner

## 2012-03-15 VITALS — BP 182/65 | HR 62 | Temp 97.5°F | Ht 63.5 in | Wt 257.2 lb

## 2012-03-15 DIAGNOSIS — C9192 Lymphoid leukemia, unspecified, in relapse: Secondary | ICD-10-CM

## 2012-03-15 DIAGNOSIS — H81319 Aural vertigo, unspecified ear: Secondary | ICD-10-CM

## 2012-03-15 DIAGNOSIS — C9112 Chronic lymphocytic leukemia of B-cell type in relapse: Secondary | ICD-10-CM

## 2012-03-15 DIAGNOSIS — C50919 Malignant neoplasm of unspecified site of unspecified female breast: Secondary | ICD-10-CM

## 2012-03-15 LAB — COMPREHENSIVE METABOLIC PANEL
ALT: 33 U/L (ref 0–35)
AST: 20 U/L (ref 0–37)
Alkaline Phosphatase: 102 U/L (ref 39–117)
BUN: 24 mg/dL — ABNORMAL HIGH (ref 6–23)
Chloride: 109 mEq/L (ref 96–112)
Creatinine, Ser: 1.24 mg/dL — ABNORMAL HIGH (ref 0.50–1.10)
Total Bilirubin: 0.6 mg/dL (ref 0.3–1.2)

## 2012-03-15 LAB — CBC WITH DIFFERENTIAL/PLATELET
BASO%: 0.1 % (ref 0.0–2.0)
Basophils Absolute: 0 10*3/uL (ref 0.0–0.1)
EOS%: 0.6 % (ref 0.0–7.0)
HCT: 28.7 % — ABNORMAL LOW (ref 34.8–46.6)
HGB: 9.9 g/dL — ABNORMAL LOW (ref 11.6–15.9)
MCH: 41.3 pg — ABNORMAL HIGH (ref 25.1–34.0)
MCHC: 34.6 g/dL (ref 31.5–36.0)
MCV: 119.5 fL — ABNORMAL HIGH (ref 79.5–101.0)
MONO%: 2.1 % (ref 0.0–14.0)
NEUT%: 10 % — ABNORMAL LOW (ref 38.4–76.8)
lymph#: 9.3 10*3/uL — ABNORMAL HIGH (ref 0.9–3.3)

## 2012-03-15 LAB — TECHNOLOGIST REVIEW

## 2012-03-15 MED ORDER — GUAIFENESIN ER 600 MG PO TB12: 600.0000 mg | ORAL_TABLET | Freq: Two times a day (BID) | ORAL | Status: AC

## 2012-03-15 MED ORDER — MECLIZINE HCL 12.5 MG PO TABS: 12.5000 mg | ORAL_TABLET | Freq: Three times a day (TID) | ORAL | Status: AC | PRN

## 2012-03-15 NOTE — Telephone Encounter (Signed)
Pt will come tomorrow to get complete calendar, emailed Marcelino Duster regarding chemo

## 2012-03-15 NOTE — Telephone Encounter (Signed)
Move pt from lt to kc as lt is ill,ok to bdl book per kc   aom

## 2012-03-15 NOTE — Progress Notes (Signed)
Hematology and Oncology Follow Up Visit  [name] [id number] Sep 22, 1934 78 y.o. 03/15/2012 12:57 PM   Principle Diagnosis: Encounter Diagnosis  Name Primary?  . 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 Her daughter is here at the visit with her. The patient started on Arzerra on 02/20/12. The patient is tolerating the medication well overall. She reports mild fatigue that is not different from baseline. No change in her performance status.  At her last visit, she was complaining of new onset vertigo. She had a history of a previous stroke. Vertigo is ongoing. No neurological changes note today. Has has a recent MRI of the brain which did not show any abnormalities. Reports feeling congested in her ears. Denies chest pain, shortness of breath, cough. No hemoptysis, hematemesis, hematuria, or melena. Denies fevers and chills.   Medications: reviewed and updated  Allergies:  Allergies  Allergen Reactions  . Latex   . Penicillins     Review of Systems: Constitutional:    Respiratory: See above Cardiovascular:   Gastrointestinal: Genito-Urinary:  Musculoskeletal: See above Neurologic: See above Skin: Remaining ROS negative.  Physical Exam: Blood pressure 182/65, pulse 62, temperature 97.5 F (36.4 C), temperature source Oral, height 5' 3.5" (1.613 m), weight 257 lb 3.2 oz (116.665 kg). Wt Readings from Last 3 Encounters:  03/15/12 257 lb 3.2 oz (116.665 kg)  02/19/12 251 lb (113.853 kg)  02/13/12 251 lb 14.4 oz (114.261 kg)     General appearance: Pleasant obese African American woman HENNT: Pharynx no erythema or exudate Lymph nodes: Persistent 2-3 cm node palpable left axilla no other areas of adenopathy Breasts: Left mastectomy Lungs: Clear to auscultation resonant to percussion Heart: Regular rhythm  1-2/6 systolic murmur left sternal border Abdomen: Soft obese nontender no mass no organomegaly Extremities: No edema no calf tenderness Vascular: No cyanosis Neurologic: No focal deficit Skin: Chronic nodular dermatitis  Lab Results: Lab Results  Component Value Date   WBC 10.7* 03/15/2012   HGB 9.9* 03/15/2012   HCT 28.7* 03/15/2012   MCV 119.5* 03/15/2012   PLT 58* 03/15/2012     Chemistry      Component Value Date/Time   NA 139 02/03/2012 1010   K 4.1 02/03/2012 1010   CL 104 02/03/2012 1010   CO2 27 02/03/2012 1010   BUN 21 02/03/2012 1010   CREATININE 1.00 02/03/2012 1010      Component Value Date/Time   CALCIUM 9.6 02/03/2012 1010   ALKPHOS 105 02/03/2012 1010   AST 22 02/03/2012 1010   ALT 25 02/03/2012 1010   BILITOT 0.6 02/03/2012 1010     Impression and Plan: #1. Relapsed chronic lymphocytic leukemia/well-differentiated lymphocytic lymphoma Currently on Arzerra and overall tolerating this well. She will receive week three tomorrow. The plan is for her to receive weekly doses x8 and then  maintenance doses every other month x4 doses. PAC is in place and without signs of infection today.  #2. Vertigo. Negative MRI but can't rule out possibility that she had another TIA in the vertebrobasilar artery distribution. She'll remain on aspirin. I'm not adding another antiplatelet agent in view of her falling platelet count. Reports congestion in her ears and will try her on a short course of Mucinex. I have given her Meclizine to be used three times a day as needed.  #3. Multifocal invasive and intraductal carcinoma left breast estrogen  receptor positive she will continue hormonal therapy with tamoxifen.  #4. Cerebrovascular disease with history of a stroke in September 2010.  #5. Chronic nodular dermatitis.  #6. Essential hypertension. She will review her medications with her primary care physician   CC:. Dr. Della Goo  [place], [place] 6/17/201312:57 PM

## 2012-03-16 ENCOUNTER — Ambulatory Visit (HOSPITAL_BASED_OUTPATIENT_CLINIC_OR_DEPARTMENT_OTHER): Payer: Medicare Other

## 2012-03-16 ENCOUNTER — Other Ambulatory Visit: Payer: Medicare Other

## 2012-03-16 VITALS — BP 198/79 | HR 68 | Temp 98.6°F

## 2012-03-16 DIAGNOSIS — Z5112 Encounter for antineoplastic immunotherapy: Secondary | ICD-10-CM

## 2012-03-16 DIAGNOSIS — C9192 Lymphoid leukemia, unspecified, in relapse: Secondary | ICD-10-CM

## 2012-03-16 DIAGNOSIS — C8590 Non-Hodgkin lymphoma, unspecified, unspecified site: Secondary | ICD-10-CM

## 2012-03-16 DIAGNOSIS — C9112 Chronic lymphocytic leukemia of B-cell type in relapse: Secondary | ICD-10-CM

## 2012-03-16 MED ORDER — METHYLPREDNISOLONE SODIUM SUCC 125 MG IJ SOLR
125.0000 mg | Freq: Once | INTRAMUSCULAR | Status: AC
  Administered 2012-03-16: 125 mg via INTRAVENOUS

## 2012-03-16 MED ORDER — SODIUM CHLORIDE 0.9 % IV SOLN
Freq: Once | INTRAVENOUS | Status: AC
  Administered 2012-03-16: 10:00:00 via INTRAVENOUS

## 2012-03-16 MED ORDER — SODIUM CHLORIDE 0.9 % IJ SOLN
10.0000 mL | INTRAMUSCULAR | Status: DC | PRN
  Administered 2012-03-16: 10 mL
  Filled 2012-03-16: qty 10

## 2012-03-16 MED ORDER — DIPHENHYDRAMINE HCL 25 MG PO CAPS
50.0000 mg | ORAL_CAPSULE | Freq: Once | ORAL | Status: AC
  Administered 2012-03-16: 50 mg via ORAL

## 2012-03-16 MED ORDER — SODIUM CHLORIDE 0.9 % IJ SOLN
3.0000 mL | INTRAMUSCULAR | Status: DC | PRN
  Filled 2012-03-16: qty 10

## 2012-03-16 MED ORDER — HEPARIN SOD (PORK) LOCK FLUSH 100 UNIT/ML IV SOLN
500.0000 [IU] | Freq: Once | INTRAVENOUS | Status: AC | PRN
  Administered 2012-03-16: 500 [IU]
  Filled 2012-03-16: qty 5

## 2012-03-16 MED ORDER — SODIUM CHLORIDE 0.9 % IV SOLN
2000.0000 mg | Freq: Once | INTRAVENOUS | Status: AC
  Administered 2012-03-16: 2000 mg via INTRAVENOUS
  Filled 2012-03-16: qty 100

## 2012-03-16 MED ORDER — ACETAMINOPHEN 500 MG PO TABS
1000.0000 mg | ORAL_TABLET | Freq: Once | ORAL | Status: AC
  Administered 2012-03-16: 1000 mg via ORAL

## 2012-03-16 NOTE — Progress Notes (Signed)
BP elevated post infusion.  Pt. Denied any symptoms.  Discharged in stable condition. Pt. Aware to call if she has further questions or issues. HL

## 2012-03-16 NOTE — Patient Instructions (Addendum)
[facility] Discharge Instructions for Patients Receiving Chemotherapy  Today you received the following chemotherapy agents [place] To help prevent nausea and vomiting after your treatment, we encourage you to take your nausea medication Dr. Cyndie Chime.   If you develop nausea and vomiting that is not controlled by your nausea medication, call the clinic. If it is after clinic hours your family physician or the after hours number for the clinic or go to the Emergency Department.   BELOW ARE SYMPTOMS THAT SHOULD BE REPORTED IMMEDIATELY:  *FEVER GREATER THAN 100.5 F  *CHILLS WITH OR WITHOUT FEVER  NAUSEA AND VOMITING THAT IS NOT CONTROLLED WITH YOUR NAUSEA MEDICATION  *UNUSUAL SHORTNESS OF BREATH  *UNUSUAL BRUISING OR BLEEDING  TENDERNESS IN MOUTH AND THROAT WITH OR WITHOUT PRESENCE OF ULCERS  *URINARY PROBLEMS  *BOWEL PROBLEMS  UNUSUAL RASH Items with * indicate a potential emergency and should be followed up as soon as possible.   The clinic phone number is [phone].   I have been informed and understand all the instructions given to me. I know to contact the clinic, my physician, or go to the Emergency Department if any problems should occur. I do not have any questions at this time, but understand that I may call the clinic during office hours   should I have any questions or need assistance in obtaining follow up care.    __________________________________________  _____________  __________ Signature of Patient or Authorized Representative            Date                   Time    __________________________________________ Nurse's Signature

## 2012-03-17 ENCOUNTER — Telehealth: Payer: Self-pay | Admitting: *Deleted

## 2012-03-17 NOTE — Telephone Encounter (Signed)
Per conversation with Dr. Cyndie Chime, there is no available for treatment next week until 3/27. Ok to move treatment appt. I have called and left the daughter a message.    JMW

## 2012-03-18 ENCOUNTER — Other Ambulatory Visit: Payer: Self-pay | Admitting: Oncology

## 2012-03-18 ENCOUNTER — Other Ambulatory Visit: Payer: Self-pay | Admitting: Certified Registered Nurse Anesthetist

## 2012-03-19 ENCOUNTER — Telehealth: Payer: Self-pay | Admitting: Oncology

## 2012-03-19 NOTE — Telephone Encounter (Signed)
Called pt and left message for June and July 2013 lab and MD

## 2012-03-23 ENCOUNTER — Other Ambulatory Visit: Payer: Medicare Other

## 2012-03-23 ENCOUNTER — Other Ambulatory Visit: Payer: Self-pay | Admitting: *Deleted

## 2012-03-23 DIAGNOSIS — C9192 Lymphoid leukemia, unspecified, in relapse: Secondary | ICD-10-CM

## 2012-03-23 MED ORDER — TAMOXIFEN CITRATE 20 MG PO TABS: 20.0000 mg | ORAL_TABLET | Freq: Every day | ORAL | Status: DC

## 2012-03-25 ENCOUNTER — Other Ambulatory Visit: Payer: Medicare Other | Admitting: Lab

## 2012-03-25 ENCOUNTER — Ambulatory Visit (HOSPITAL_BASED_OUTPATIENT_CLINIC_OR_DEPARTMENT_OTHER): Payer: Medicare Other

## 2012-03-25 VITALS — BP 166/70 | HR 72 | Temp 98.7°F

## 2012-03-25 DIAGNOSIS — Z5112 Encounter for antineoplastic immunotherapy: Secondary | ICD-10-CM

## 2012-03-25 DIAGNOSIS — C9192 Lymphoid leukemia, unspecified, in relapse: Secondary | ICD-10-CM

## 2012-03-25 DIAGNOSIS — C9112 Chronic lymphocytic leukemia of B-cell type in relapse: Secondary | ICD-10-CM

## 2012-03-25 DIAGNOSIS — C8590 Non-Hodgkin lymphoma, unspecified, unspecified site: Secondary | ICD-10-CM

## 2012-03-25 DIAGNOSIS — R0602 Shortness of breath: Secondary | ICD-10-CM

## 2012-03-25 DIAGNOSIS — L509 Urticaria, unspecified: Secondary | ICD-10-CM

## 2012-03-25 LAB — CBC WITH DIFFERENTIAL/PLATELET
BASO%: 0.1 % (ref 0.0–2.0)
EOS%: 0.4 % (ref 0.0–7.0)
Eosinophils Absolute: 0.1 10*3/uL (ref 0.0–0.5)
MCHC: 34.5 g/dL (ref 31.5–36.0)
MCV: 114.7 fL — ABNORMAL HIGH (ref 79.5–101.0)
MONO%: 3.2 % (ref 0.0–14.0)
NEUT#: 2 10*3/uL (ref 1.5–6.5)
RBC: 2.73 10*6/uL — ABNORMAL LOW (ref 3.70–5.45)
RDW: 15.6 % — ABNORMAL HIGH (ref 11.2–14.5)
nRBC: 0 % (ref 0–0)

## 2012-03-25 MED ORDER — ACETAMINOPHEN 500 MG PO TABS
1000.0000 mg | ORAL_TABLET | Freq: Once | ORAL | Status: AC
  Administered 2012-03-25: 1000 mg via ORAL

## 2012-03-25 MED ORDER — DIPHENHYDRAMINE HCL 50 MG/ML IJ SOLN
25.0000 mg | Freq: Once | INTRAMUSCULAR | Status: AC
  Administered 2012-03-25: 25 mg via INTRAVENOUS

## 2012-03-25 MED ORDER — METHYLPREDNISOLONE SODIUM SUCC 40 MG IJ SOLR
40.0000 mg | Freq: Once | INTRAMUSCULAR | Status: AC
  Administered 2012-03-25: 40 mg via INTRAVENOUS

## 2012-03-25 MED ORDER — DIPHENHYDRAMINE HCL 25 MG PO CAPS
50.0000 mg | ORAL_CAPSULE | Freq: Once | ORAL | Status: AC
  Administered 2012-03-25: 50 mg via ORAL

## 2012-03-25 MED ORDER — METHYLPREDNISOLONE SODIUM SUCC 125 MG IJ SOLR
125.0000 mg | Freq: Once | INTRAMUSCULAR | Status: AC
  Administered 2012-03-25: 125 mg via INTRAVENOUS

## 2012-03-25 MED ORDER — SODIUM CHLORIDE 0.9 % IV SOLN
Freq: Once | INTRAVENOUS | Status: AC
  Administered 2012-03-25: 09:00:00 via INTRAVENOUS

## 2012-03-25 MED ORDER — SODIUM CHLORIDE 0.9 % IV SOLN
2000.0000 mg | Freq: Once | INTRAVENOUS | Status: AC
  Administered 2012-03-25: 2000 mg via INTRAVENOUS
  Filled 2012-03-25: qty 100

## 2012-03-25 MED ORDER — SODIUM CHLORIDE 0.9 % IJ SOLN
10.0000 mL | INTRAMUSCULAR | Status: DC | PRN
  Administered 2012-03-25: 10 mL
  Filled 2012-03-25: qty 10

## 2012-03-25 MED ORDER — HEPARIN SOD (PORK) LOCK FLUSH 100 UNIT/ML IV SOLN
500.0000 [IU] | Freq: Once | INTRAVENOUS | Status: AC | PRN
  Administered 2012-03-25: 500 [IU]
  Filled 2012-03-25: qty 5

## 2012-03-25 NOTE — Progress Notes (Signed)
Pt has no s/s of reaction.  Pt states she feels fine.  Pt states her BP is usually elevated, took medication this morning.  Arzerra rate increased to 50 ml/hr x 25.

## 2012-03-25 NOTE — Progress Notes (Signed)
VSS. Pt has no s/s of reaction.  Arzerra rate increased to 100 ml/hr x 50 ml.Marland Kitchen

## 2012-03-25 NOTE — Progress Notes (Signed)
Vss. Pt has no s/s of reaction.  Rate continued at 400 ml/hr x remainder of infusion

## 2012-03-25 NOTE — Progress Notes (Signed)
VSS. Pt has no s/s of reaction.  Rate increased to 200 ml/hr x 100 ml

## 2012-03-25 NOTE — Progress Notes (Signed)
Pt just had some soup.  Pt has no complaints or s/s of reaction.  Rate increased  To 400 ml/hr x 200 ml

## 2012-03-25 NOTE — Progress Notes (Signed)
Was called by Elmarie Shiley, RN for assistance.   Pt had ambulated to bathroom.  Upon returning to room, pt c/o SOB,  Feeling lightheaded,  Stating  "  Just can't seem to breathe well."   Azerra stopped at  1405.  Pt had received approx.  700 ml of Azerra bag ( out of  1000 ml )  Small areas of hives noted on left upper chest, and along upper left side of neck, and also across upper back above shoulders .    Lonna Cobb, NP notified.  NP in to assess pt at 1420.   Orders received for  Solumedrol  40 mg ,  Benadryl  25 mg  IV.   Meds given as per order.    1445  Misty Stanley, NP reassessed pt.  Per Misty Stanley,  Discontinued  Rest of  Azerra med today ( as per Dr. Truett Perna ).   Pt will be monitored closely with normal saline infusing at 100cc/hr.   Daughter at bedside.  Pt remained  A&O x 3. 1455  -   Pt stated relief of above symptoms - able to drink room temp. Water and swallow fine.  Hives areas resolved. Will continue to monitor pt closely.

## 2012-03-25 NOTE — Patient Instructions (Addendum)
[facility] Discharge Instructions for Patients Receiving Chemotherapy  Today you received the following chemotherapy agents Arzerra  To help prevent nausea and vomiting after your treatment, we encourage you to take your nausea medication as directed by MD.  If you develop nausea and vomiting that is not controlled by your nausea medication, call the clinic. If it is after clinic hours your family physician or the after hours number for the clinic or go to the Emergency Department.   BELOW ARE SYMPTOMS THAT SHOULD BE REPORTED IMMEDIATELY:  *FEVER GREATER THAN 100.5 F  *CHILLS WITH OR WITHOUT FEVER  NAUSEA AND VOMITING THAT IS NOT CONTROLLED WITH YOUR NAUSEA MEDICATION  *UNUSUAL SHORTNESS OF BREATH  *UNUSUAL BRUISING OR BLEEDING  TENDERNESS IN MOUTH AND THROAT WITH OR WITHOUT PRESENCE OF ULCERS  *URINARY PROBLEMS  *BOWEL PROBLEMS  UNUSUAL RASH Items with * indicate a potential emergency and should be followed up as soon as possible.  Feel free to call the clinic you have any questions or concerns. The clinic phone number is [phone].  Pt was stable at discharge with daughter via ambulation.  Pt aware of next return appts.

## 2012-03-28 ENCOUNTER — Other Ambulatory Visit: Payer: Self-pay | Admitting: Oncology

## 2012-03-29 ENCOUNTER — Ambulatory Visit (HOSPITAL_BASED_OUTPATIENT_CLINIC_OR_DEPARTMENT_OTHER): Payer: Medicare Other | Admitting: Oncology

## 2012-03-29 ENCOUNTER — Other Ambulatory Visit: Payer: Medicare Other | Admitting: Lab

## 2012-03-29 VITALS — BP 160/94 | HR 84 | Temp 98.3°F | Ht 63.5 in | Wt 257.6 lb

## 2012-03-29 DIAGNOSIS — C9192 Lymphoid leukemia, unspecified, in relapse: Secondary | ICD-10-CM

## 2012-03-29 DIAGNOSIS — Z170 Estrogen receptor positive status [ER+]: Secondary | ICD-10-CM

## 2012-03-29 DIAGNOSIS — C9110 Chronic lymphocytic leukemia of B-cell type not having achieved remission: Secondary | ICD-10-CM

## 2012-03-29 DIAGNOSIS — C8589 Other specified types of non-Hodgkin lymphoma, extranodal and solid organ sites: Secondary | ICD-10-CM

## 2012-03-29 DIAGNOSIS — I69398 Other sequelae of cerebral infarction: Secondary | ICD-10-CM

## 2012-03-29 DIAGNOSIS — C50919 Malignant neoplasm of unspecified site of unspecified female breast: Secondary | ICD-10-CM

## 2012-03-29 LAB — COMPREHENSIVE METABOLIC PANEL
ALT: 28 U/L (ref 0–35)
Albumin: 4.4 g/dL (ref 3.5–5.2)
CO2: 23 mEq/L (ref 19–32)
Chloride: 107 mEq/L (ref 96–112)
Glucose, Bld: 128 mg/dL — ABNORMAL HIGH (ref 70–99)
Potassium: 4.1 mEq/L (ref 3.5–5.3)
Sodium: 139 mEq/L (ref 135–145)
Total Bilirubin: 0.6 mg/dL (ref 0.3–1.2)
Total Protein: 6.1 g/dL (ref 6.0–8.3)

## 2012-03-29 LAB — TECHNOLOGIST REVIEW

## 2012-03-29 LAB — CBC WITH DIFFERENTIAL/PLATELET
BASO%: 0.1 % (ref 0.0–2.0)
Eosinophils Absolute: 0.1 10*3/uL (ref 0.0–0.5)
MCHC: 34.4 g/dL (ref 31.5–36.0)
MONO#: 0.3 10*3/uL (ref 0.1–0.9)
NEUT#: 1.4 10*3/uL — ABNORMAL LOW (ref 1.5–6.5)
RBC: 2.51 10*6/uL — ABNORMAL LOW (ref 3.70–5.45)
RDW: 16.5 % — ABNORMAL HIGH (ref 11.2–14.5)
WBC: 12 10*3/uL — ABNORMAL HIGH (ref 3.9–10.3)
lymph#: 10.2 10*3/uL — ABNORMAL HIGH (ref 0.9–3.3)

## 2012-03-29 NOTE — Progress Notes (Signed)
Hematology and Oncology Follow Up Visit  [name] [id number] November 16, 1933 78 y.o. 03/29/2012 7:46 PM   Principle Diagnosis: Encounter Diagnoses  Name Primary?  . CLL (chronic lymphoid leukemia) in relapse Yes  . Vertigo as late effect of stroke      Interim History:  Short interim followup visit for this pleasant but complicated 78 year old woman who presented with a concomitant diagnosis of left breast cancer and well-differentiated lymphocytic lymphoma/CLL in November 2009 when she presented with rapidly progressive pancytopenia with minimal lymphadenopathy limited to the left axilla. She was refractory to multiple initial treatments including fludarabine/Cytoxan/Rituxan, Campath, but did subsequently have a good partial response to Arzerra initially given between April and June of 2010 then monthly through October of 2010. Beginning in may of this year, blood counts began to deteriorate with fall in her platelet count down to 59,000 and rise in percent lymphocytes on her white count differential. She was started back on Arzerra on may 24th. She had a initial test dose of 300 mg and subsequent weekly 2000 mg doses given on June 10, June 20, and June 27. She has been getting significant allergic reactions to the product this time around. She developed diffuse facial erythema with the June 20 dose and towards the end of the June 27 dose, she developed vertigo and throat tightness in the infusion was discontinued. She was given additional steroids and Benadryl and the reaction subsided promptly. She's been having atypical chest and neurologic symptoms over the last month detailed in my recent progress notes. Extensive evaluation has failed to reveal evidence for a new stroke. She was on aspirin and Plavix but I discontinued the Plavix when her platelet count started to fall.  Her chest symptoms have resolved at this time. She still having intermittent attacks of vertigo which can occur at rest and not  just with change in position.  Medications: reviewed  Allergies:  Allergies  Allergen Reactions  . Latex   . Penicillins     Review of Systems: Constitutional:   No constitutional symptoms Respiratory: Currently no cough, dyspnea, or chest pains. Cardiovascular:  No chest pain or palpitation Gastrointestinal: No abdominal pain or change in bowel habit Genito-Urinary: No urinary tract symptoms Musculoskeletal: No muscle or bone pain Neurologic: Intermittent blurred vision right eye. Recurrent episodes of vertigo. Skin: Chronic nodular dermatitis Remaining ROS negative.  Physical Exam: Blood pressure 160/94, pulse 84, temperature 98.3 F (36.8 C), temperature source Oral, height 5' 3.5" (1.613 m), weight 257 lb 9.6 oz (116.847 kg). Wt Readings from Last 3 Encounters:  03/29/12 257 lb 9.6 oz (116.847 kg)  03/15/12 257 lb 3.2 oz (116.665 kg)  02/19/12 251 lb (113.853 kg)     General appearance: Well-nourished African American woman HENNT: Pharynx no erythema exudate or ulcer Lymph nodes: No palpable adenopathy in the neck supraclavicular or axillary regions Breasts: Left mastectomy no chest wall lesions Lungs: Clear to auscultation resonant to percussion Heart: Regular rhythm 1-2/6 aortic systolic murmur Abdomen: Soft obese nontender Extremities: Trace edema no calf tenderness Vascular: No cyanosis Neurologic: Mental status intact, cranial nerves grossly normal but formal visual testing not done. Left optic disc sharp vessels are normal. I could not see the right fundus to do cataract. Motor strength is 5 over 5. Reflexes 1+ symmetric. Oh extraocular movements. Upper body coordination is normal. Gait normal. Skin: Chronic nodular dermatitis  Lab Results: Lab Results  Component Value Date   WBC 12.0* 03/29/2012   HGB 10.4* 03/29/2012   HCT 30.1* 03/29/2012  MCV 119.6* 03/29/2012   PLT 78* 03/29/2012     Chemistry      Component Value Date/Time   NA 139 03/29/2012 1200   K 4.1  03/29/2012 1200   CL 107 03/29/2012 1200   CO2 23 03/29/2012 1200   BUN 23 03/29/2012 1200   CREATININE 1.12* 03/29/2012 1200      Component Value Date/Time   CALCIUM 9.4 03/29/2012 1200   ALKPHOS 91 03/29/2012 1200   AST 19 03/29/2012 1200   ALT 28 03/29/2012 1200   BILITOT 0.6 03/29/2012 1200       Radiological Studies: No results found.  Impression and Plan: #1. Well-differentiated lymphocytic lymphoma/CLL Too early to assess response to the Arzerra. Platelet count has returned to her approximate baseline. Hemoglobin remains stable and she is transfusion independent. There has been no improvement in the white count differential to date. Plan:I will cautiously continue the Arzerra. As noted above, she is now becoming sensitized to the drug and we may not be able to continue it. I'm going to add an H2 blocker ( Pepcid) as part of her premedication and give the infusion slowly tomorrow.  #2. Initial noninvasive cancer left breast with subsequent invasive cancer ER positive status post left mastectomy. She is back on tamoxifen at this time. She did not tolerate an aromatase inhibitor.  #3. Intermittent neurologic symptoms with history of a stroke in September 2010. No acute change on recent MRI of the brain but still possible that she had another TIA. Current symptoms suggest vertebrobasilar insufficiency.  #4. Intermittent pleuritic chest pain. Etiology unclear. Symptoms currently resolved.  #5. Essential hypertension. Borderline controlled on current medications.  #6. Chronic nodular dermatitis   CC:. Dr. Della Goo; Dr. Wilfrid Lund   Levert Feinstein, MD 7/1/20137:46 PM

## 2012-03-30 ENCOUNTER — Ambulatory Visit (HOSPITAL_BASED_OUTPATIENT_CLINIC_OR_DEPARTMENT_OTHER): Payer: Medicare Other

## 2012-03-30 ENCOUNTER — Other Ambulatory Visit: Payer: Medicare Other

## 2012-03-30 VITALS — BP 153/60 | HR 98 | Temp 97.8°F

## 2012-03-30 DIAGNOSIS — C9112 Chronic lymphocytic leukemia of B-cell type in relapse: Secondary | ICD-10-CM

## 2012-03-30 DIAGNOSIS — C9192 Lymphoid leukemia, unspecified, in relapse: Secondary | ICD-10-CM

## 2012-03-30 DIAGNOSIS — C8590 Non-Hodgkin lymphoma, unspecified, unspecified site: Secondary | ICD-10-CM

## 2012-03-30 DIAGNOSIS — Z5111 Encounter for antineoplastic chemotherapy: Secondary | ICD-10-CM

## 2012-03-30 MED ORDER — HEPARIN SOD (PORK) LOCK FLUSH 100 UNIT/ML IV SOLN
500.0000 [IU] | Freq: Once | INTRAVENOUS | Status: AC | PRN
  Administered 2012-03-30: 500 [IU]
  Filled 2012-03-30: qty 5

## 2012-03-30 MED ORDER — DIPHENHYDRAMINE HCL 25 MG PO CAPS
50.0000 mg | ORAL_CAPSULE | Freq: Once | ORAL | Status: AC
  Administered 2012-03-30: 50 mg via ORAL

## 2012-03-30 MED ORDER — FAMOTIDINE IN NACL 20-0.9 MG/50ML-% IV SOLN
20.0000 mg | Freq: Once | INTRAVENOUS | Status: AC
  Administered 2012-03-30: 20 mg via INTRAVENOUS

## 2012-03-30 MED ORDER — ACETAMINOPHEN 500 MG PO TABS
1000.0000 mg | ORAL_TABLET | Freq: Once | ORAL | Status: AC
  Administered 2012-03-30: 1000 mg via ORAL

## 2012-03-30 MED ORDER — SODIUM CHLORIDE 0.9 % IJ SOLN
10.0000 mL | INTRAMUSCULAR | Status: DC | PRN
  Administered 2012-03-30: 10 mL
  Filled 2012-03-30: qty 10

## 2012-03-30 MED ORDER — SODIUM CHLORIDE 0.9 % IV SOLN
Freq: Once | INTRAVENOUS | Status: AC
  Administered 2012-03-30: 10:00:00 via INTRAVENOUS

## 2012-03-30 MED ORDER — SODIUM CHLORIDE 0.9 % IV SOLN
2000.0000 mg | Freq: Once | INTRAVENOUS | Status: AC
  Administered 2012-03-30: 2000 mg via INTRAVENOUS
  Filled 2012-03-30: qty 100

## 2012-03-30 MED ORDER — METHYLPREDNISOLONE SODIUM SUCC 125 MG IJ SOLR
125.0000 mg | Freq: Once | INTRAMUSCULAR | Status: AC
  Administered 2012-03-30: 125 mg via INTRAVENOUS

## 2012-03-30 NOTE — Patient Instructions (Addendum)
[facility] Discharge Instructions for Patients Receiving Chemotherapy  Today you received the following chemotherapy agents Arzerra  To help prevent nausea and vomiting after your treatment, we encourage you to take your nausea medication as prescribed.   If you develop nausea and vomiting that is not controlled by your nausea medication, call the clinic. If it is after clinic hours your family physician or the after hours number for the clinic or go to the Emergency Department.   BELOW ARE SYMPTOMS THAT SHOULD BE REPORTED IMMEDIATELY:  *FEVER GREATER THAN 100.5 F  *CHILLS WITH OR WITHOUT FEVER  NAUSEA AND VOMITING THAT IS NOT CONTROLLED WITH YOUR NAUSEA MEDICATION  *UNUSUAL SHORTNESS OF BREATH  *UNUSUAL BRUISING OR BLEEDING  TENDERNESS IN MOUTH AND THROAT WITH OR WITHOUT PRESENCE OF ULCERS  *URINARY PROBLEMS  *BOWEL PROBLEMS  UNUSUAL RASH Items with * indicate a potential emergency and should be followed up as soon as possible.  One of the nurses will contact you 24 hours after your treatment. Please let the nurse know about any problems that you may have experienced. Feel free to call the clinic you have any questions or concerns. The clinic phone number is [phone].   I have been informed and understand all the instructions given to me. I know to contact the clinic, my physician, or go to the Emergency Department if any problems should occur. I do not have any questions at this time, but understand that I may call the clinic during office hours   should I have any questions or need assistance in obtaining follow up care.    __________________________________________  _____________  __________ Signature of Patient or Authorized Representative            Date                   Time    __________________________________________ Nurse's Signature

## 2012-04-01 ENCOUNTER — Other Ambulatory Visit: Payer: Self-pay | Admitting: Oncology

## 2012-04-06 ENCOUNTER — Other Ambulatory Visit (HOSPITAL_BASED_OUTPATIENT_CLINIC_OR_DEPARTMENT_OTHER): Payer: Medicare Other | Admitting: Lab

## 2012-04-06 ENCOUNTER — Ambulatory Visit (HOSPITAL_BASED_OUTPATIENT_CLINIC_OR_DEPARTMENT_OTHER): Payer: Medicare Other

## 2012-04-06 VITALS — BP 160/70 | HR 60 | Temp 97.3°F

## 2012-04-06 DIAGNOSIS — C8590 Non-Hodgkin lymphoma, unspecified, unspecified site: Secondary | ICD-10-CM

## 2012-04-06 DIAGNOSIS — C9192 Lymphoid leukemia, unspecified, in relapse: Secondary | ICD-10-CM

## 2012-04-06 DIAGNOSIS — C9112 Chronic lymphocytic leukemia of B-cell type in relapse: Secondary | ICD-10-CM

## 2012-04-06 DIAGNOSIS — Z5112 Encounter for antineoplastic immunotherapy: Secondary | ICD-10-CM

## 2012-04-06 LAB — CBC WITH DIFFERENTIAL/PLATELET
Basophils Absolute: 0 10*3/uL (ref 0.0–0.1)
Eosinophils Absolute: 0 10*3/uL (ref 0.0–0.5)
HGB: 10.2 g/dL — ABNORMAL LOW (ref 11.6–15.9)
MONO%: 0.7 % (ref 0.0–14.0)
NEUT#: 1.3 10*3/uL — ABNORMAL LOW (ref 1.5–6.5)
RBC: 2.6 10*6/uL — ABNORMAL LOW (ref 3.70–5.45)
RDW: 15.3 % — ABNORMAL HIGH (ref 11.2–14.5)
WBC: 13.5 10*3/uL — ABNORMAL HIGH (ref 3.9–10.3)
lymph#: 12 10*3/uL — ABNORMAL HIGH (ref 0.9–3.3)
nRBC: 0 % (ref 0–0)

## 2012-04-06 MED ORDER — SODIUM CHLORIDE 0.9 % IV SOLN
Freq: Once | INTRAVENOUS | Status: AC
  Administered 2012-04-06: 10:00:00 via INTRAVENOUS

## 2012-04-06 MED ORDER — SODIUM CHLORIDE 0.9 % IV SOLN
2000.0000 mg | Freq: Once | INTRAVENOUS | Status: AC
  Administered 2012-04-06: 2000 mg via INTRAVENOUS
  Filled 2012-04-06: qty 100

## 2012-04-06 MED ORDER — DIPHENHYDRAMINE HCL 25 MG PO CAPS
50.0000 mg | ORAL_CAPSULE | Freq: Once | ORAL | Status: AC
  Administered 2012-04-06: 50 mg via ORAL

## 2012-04-06 MED ORDER — SODIUM CHLORIDE 0.9 % IJ SOLN
10.0000 mL | INTRAMUSCULAR | Status: DC | PRN
  Filled 2012-04-06: qty 10

## 2012-04-06 MED ORDER — ACETAMINOPHEN 500 MG PO TABS
1000.0000 mg | ORAL_TABLET | Freq: Once | ORAL | Status: AC
  Administered 2012-04-06: 1000 mg via ORAL

## 2012-04-06 MED ORDER — FAMOTIDINE IN NACL 20-0.9 MG/50ML-% IV SOLN
20.0000 mg | Freq: Once | INTRAVENOUS | Status: AC
  Administered 2012-04-06: 20 mg via INTRAVENOUS

## 2012-04-06 MED ORDER — HEPARIN SOD (PORK) LOCK FLUSH 100 UNIT/ML IV SOLN
500.0000 [IU] | Freq: Once | INTRAVENOUS | Status: DC | PRN
  Filled 2012-04-06: qty 5

## 2012-04-06 MED ORDER — METHYLPREDNISOLONE SODIUM SUCC 125 MG IJ SOLR
125.0000 mg | Freq: Once | INTRAMUSCULAR | Status: AC
  Administered 2012-04-06: 125 mg via INTRAVENOUS

## 2012-04-06 NOTE — Patient Instructions (Addendum)
[facility] Discharge Instructions for Patients Receiving Chemotherapy  Today you received the following chemotherapy agent Azerra.  To help prevent nausea and vomiting after your treatment, we encourage you to take your nausea medication. Begin taking it as often as prescribed for by Dr. Cyndie Chime.    If you develop nausea and vomiting that is not controlled by your nausea medication, call the clinic. If it is after clinic hours your family physician or the after hours number for the clinic or go to the Emergency Department.   BELOW ARE SYMPTOMS THAT SHOULD BE REPORTED IMMEDIATELY:  *FEVER GREATER THAN 100.5 F  *CHILLS WITH OR WITHOUT FEVER  NAUSEA AND VOMITING THAT IS NOT CONTROLLED WITH YOUR NAUSEA MEDICATION  *UNUSUAL SHORTNESS OF BREATH  *UNUSUAL BRUISING OR BLEEDING  TENDERNESS IN MOUTH AND THROAT WITH OR WITHOUT PRESENCE OF ULCERS  *URINARY PROBLEMS  *BOWEL PROBLEMS  UNUSUAL RASH Items with * indicate a potential emergency and should be followed up as soon as possible.  One of the nurses will contact you 24 hours after your treatment. Please let the nurse know about any problems that you may have experienced. Feel free to call the clinic you have any questions or concerns. The clinic phone number is [phone].   I have been informed and understand all the instructions given to me. I know to contact the clinic, my physician, or go to the Emergency Department if any problems should occur. I do not have any questions at this time, but understand that I may call the clinic during office hours   should I have any questions or need assistance in obtaining follow up care.    __________________________________________  _____________  __________ Signature of Patient or Authorized Representative            Date                   Time    __________________________________________ Nurse's Signature

## 2012-04-06 NOTE — Progress Notes (Signed)
14 Ok to treat with Neut @ 1.3 per Lonna Cobb, NP.

## 2012-04-07 ENCOUNTER — Other Ambulatory Visit: Payer: Self-pay | Admitting: Certified Registered Nurse Anesthetist

## 2012-04-07 ENCOUNTER — Other Ambulatory Visit: Payer: Self-pay | Admitting: Oncology

## 2012-04-13 ENCOUNTER — Other Ambulatory Visit (HOSPITAL_BASED_OUTPATIENT_CLINIC_OR_DEPARTMENT_OTHER): Payer: Medicare Other | Admitting: Lab

## 2012-04-13 ENCOUNTER — Ambulatory Visit (HOSPITAL_BASED_OUTPATIENT_CLINIC_OR_DEPARTMENT_OTHER): Payer: Medicare Other

## 2012-04-13 VITALS — BP 200/74 | HR 78 | Temp 98.4°F

## 2012-04-13 DIAGNOSIS — C9112 Chronic lymphocytic leukemia of B-cell type in relapse: Secondary | ICD-10-CM

## 2012-04-13 DIAGNOSIS — C8590 Non-Hodgkin lymphoma, unspecified, unspecified site: Secondary | ICD-10-CM

## 2012-04-13 DIAGNOSIS — Z5112 Encounter for antineoplastic immunotherapy: Secondary | ICD-10-CM

## 2012-04-13 DIAGNOSIS — C9192 Lymphoid leukemia, unspecified, in relapse: Secondary | ICD-10-CM

## 2012-04-13 LAB — CBC WITH DIFFERENTIAL/PLATELET
BASO%: 0.2 % (ref 0.0–2.0)
EOS%: 0.5 % (ref 0.0–7.0)
MCH: 39.9 pg — ABNORMAL HIGH (ref 25.1–34.0)
MCHC: 34.9 g/dL (ref 31.5–36.0)
NEUT%: 10.6 % — ABNORMAL LOW (ref 38.4–76.8)
RBC: 2.58 10*6/uL — ABNORMAL LOW (ref 3.70–5.45)
RDW: 15.3 % — ABNORMAL HIGH (ref 11.2–14.5)
lymph#: 10 10*3/uL — ABNORMAL HIGH (ref 0.9–3.3)

## 2012-04-13 MED ORDER — SODIUM CHLORIDE 0.9 % IJ SOLN
10.0000 mL | INTRAMUSCULAR | Status: DC | PRN
  Filled 2012-04-13: qty 10

## 2012-04-13 MED ORDER — SODIUM CHLORIDE 0.9 % IV SOLN
Freq: Once | INTRAVENOUS | Status: AC
  Administered 2012-04-13: 09:00:00 via INTRAVENOUS

## 2012-04-13 MED ORDER — OFATUMUMAB CHEMO INJECTION 100 MG/5ML
2000.0000 mg | Freq: Once | INTRAVENOUS | Status: AC
  Administered 2012-04-13: 2000 mg via INTRAVENOUS
  Filled 2012-04-13: qty 100

## 2012-04-13 MED ORDER — HEPARIN SOD (PORK) LOCK FLUSH 100 UNIT/ML IV SOLN
500.0000 [IU] | Freq: Once | INTRAVENOUS | Status: AC | PRN
  Filled 2012-04-13: qty 5

## 2012-04-13 MED ORDER — FAMOTIDINE IN NACL 20-0.9 MG/50ML-% IV SOLN
20.0000 mg | Freq: Once | INTRAVENOUS | Status: AC
  Administered 2012-04-13: 20 mg via INTRAVENOUS

## 2012-04-13 MED ORDER — METHYLPREDNISOLONE SODIUM SUCC 125 MG IJ SOLR
125.0000 mg | Freq: Once | INTRAMUSCULAR | Status: AC
  Administered 2012-04-13: 125 mg via INTRAVENOUS

## 2012-04-13 MED ORDER — ACETAMINOPHEN 500 MG PO TABS
1000.0000 mg | ORAL_TABLET | Freq: Once | ORAL | Status: AC
  Administered 2012-04-13: 1000 mg via ORAL

## 2012-04-13 MED ORDER — DIPHENHYDRAMINE HCL 25 MG PO CAPS
50.0000 mg | ORAL_CAPSULE | Freq: Once | ORAL | Status: AC
  Administered 2012-04-13: 50 mg via ORAL

## 2012-04-13 NOTE — Patient Instructions (Signed)
[facility] Discharge Instructions for Patients Receiving Chemotherapy  Today you received the following chemotherapy agents Arzerra  To help prevent nausea and vomiting after your treatment, we encourage you to take your nausea medication.    If you develop nausea and vomiting that is not controlled by your nausea medication, call the clinic. If it is after clinic hours your family physician or the after hours number for the clinic or go to the Emergency Department.   BELOW ARE SYMPTOMS THAT SHOULD BE REPORTED IMMEDIATELY:  *FEVER GREATER THAN 100.5 F  *CHILLS WITH OR WITHOUT FEVER  NAUSEA AND VOMITING THAT IS NOT CONTROLLED WITH YOUR NAUSEA MEDICATION  *UNUSUAL SHORTNESS OF BREATH  *UNUSUAL BRUISING OR BLEEDING  TENDERNESS IN MOUTH AND THROAT WITH OR WITHOUT PRESENCE OF ULCERS  *URINARY PROBLEMS  *BOWEL PROBLEMS  UNUSUAL RASH Items with * indicate a potential emergency and should be followed up as soon as possible.  One of the nurses will contact you 24 hours after your treatment. Please let the nurse know about any problems that you may have experienced. Feel free to call the clinic you have any questions or concerns. The clinic phone number is [phone].   I have been informed and understand all the instructions given to me. I know to contact the clinic, my physician, or go to the Emergency Department if any problems should occur. I do not have any questions at this time, but understand that I may call the clinic during office hours   should I have any questions or need assistance in obtaining follow up care.    __________________________________________  _____________  __________ Signature of Patient or Authorized Representative            Date                   Time    __________________________________________ Nurse's Signature

## 2012-04-13 NOTE — Progress Notes (Unsigned)
OK to treat with ANC 1.2 per  Lonna Cobb, NP 1500: Pt walked to BR and stated that she was feeling "funny", no SOB 02 sat 100%, BP 200/84, Arzerra stopped, Dr Cyndie Chime notified. Instructed to restart after 30 minutes. 1530 Arzerra restarted as ordered. 1545: pt tolerating well. States she is feeling better.

## 2012-04-14 ENCOUNTER — Other Ambulatory Visit: Payer: Self-pay | Admitting: Oncology

## 2012-04-14 ENCOUNTER — Telehealth: Payer: Self-pay | Admitting: Oncology

## 2012-04-14 NOTE — Telephone Encounter (Signed)
Talked to daughter gave her appt for 05/03/12, lab and ML

## 2012-04-19 ENCOUNTER — Other Ambulatory Visit: Payer: Self-pay | Admitting: Nurse Practitioner

## 2012-04-20 ENCOUNTER — Other Ambulatory Visit (HOSPITAL_BASED_OUTPATIENT_CLINIC_OR_DEPARTMENT_OTHER): Payer: Medicare Other | Admitting: Lab

## 2012-04-20 ENCOUNTER — Ambulatory Visit (HOSPITAL_BASED_OUTPATIENT_CLINIC_OR_DEPARTMENT_OTHER): Payer: Medicare Other

## 2012-04-20 VITALS — BP 165/70 | HR 72 | Temp 98.0°F

## 2012-04-20 DIAGNOSIS — Z5112 Encounter for antineoplastic immunotherapy: Secondary | ICD-10-CM

## 2012-04-20 DIAGNOSIS — C9112 Chronic lymphocytic leukemia of B-cell type in relapse: Secondary | ICD-10-CM

## 2012-04-20 DIAGNOSIS — C9192 Lymphoid leukemia, unspecified, in relapse: Secondary | ICD-10-CM

## 2012-04-20 DIAGNOSIS — C8590 Non-Hodgkin lymphoma, unspecified, unspecified site: Secondary | ICD-10-CM

## 2012-04-20 LAB — CBC WITH DIFFERENTIAL/PLATELET
BASO%: 0.1 % (ref 0.0–2.0)
Basophils Absolute: 0 10*3/uL (ref 0.0–0.1)
EOS%: 0.6 % (ref 0.0–7.0)
HCT: 30.8 % — ABNORMAL LOW (ref 34.8–46.6)
HGB: 10.7 g/dL — ABNORMAL LOW (ref 11.6–15.9)
LYMPH%: 83.8 % — ABNORMAL HIGH (ref 14.0–49.7)
MCH: 39.9 pg — ABNORMAL HIGH (ref 25.1–34.0)
MCHC: 34.7 g/dL (ref 31.5–36.0)
MCV: 114.9 fL — ABNORMAL HIGH (ref 79.5–101.0)
MONO%: 4.8 % (ref 0.0–14.0)
NEUT%: 10.7 % — ABNORMAL LOW (ref 38.4–76.8)
Platelets: 89 10*3/uL — ABNORMAL LOW (ref 145–400)

## 2012-04-20 MED ORDER — SODIUM CHLORIDE 0.9 % IJ SOLN
10.0000 mL | INTRAMUSCULAR | Status: DC | PRN
  Administered 2012-04-20: 10 mL
  Filled 2012-04-20: qty 10

## 2012-04-20 MED ORDER — METHYLPREDNISOLONE SODIUM SUCC 125 MG IJ SOLR
125.0000 mg | Freq: Once | INTRAMUSCULAR | Status: AC
  Administered 2012-04-20: 125 mg via INTRAVENOUS

## 2012-04-20 MED ORDER — FAMOTIDINE IN NACL 20-0.9 MG/50ML-% IV SOLN
20.0000 mg | Freq: Once | INTRAVENOUS | Status: AC
  Administered 2012-04-20: 20 mg via INTRAVENOUS

## 2012-04-20 MED ORDER — OFATUMUMAB CHEMO INJECTION 100 MG/5ML
2000.0000 mg | Freq: Once | INTRAVENOUS | Status: AC
  Administered 2012-04-20: 2000 mg via INTRAVENOUS
  Filled 2012-04-20: qty 100

## 2012-04-20 MED ORDER — DIPHENHYDRAMINE HCL 25 MG PO CAPS
50.0000 mg | ORAL_CAPSULE | Freq: Once | ORAL | Status: AC
  Administered 2012-04-20: 50 mg via ORAL

## 2012-04-20 MED ORDER — SODIUM CHLORIDE 0.9 % IV SOLN
Freq: Once | INTRAVENOUS | Status: AC
  Administered 2012-04-20: 09:00:00 via INTRAVENOUS

## 2012-04-20 MED ORDER — HEPARIN SOD (PORK) LOCK FLUSH 100 UNIT/ML IV SOLN
500.0000 [IU] | Freq: Once | INTRAVENOUS | Status: AC | PRN
  Administered 2012-04-20: 500 [IU]
  Filled 2012-04-20: qty 5

## 2012-04-20 MED ORDER — ACETAMINOPHEN 500 MG PO TABS
1000.0000 mg | ORAL_TABLET | Freq: Once | ORAL | Status: AC
  Administered 2012-04-20: 1000 mg via ORAL

## 2012-04-20 NOTE — Patient Instructions (Addendum)
[facility] Discharge Instructions for Patients Receiving Chemotherapy  Today you received the following chemotherapy agents arzerra    If you develop nausea and vomiting that is not controlled by your nausea medication, call the clinic. If it is after clinic hours your family physician or the after hours number for the clinic or go to the Emergency Department.   BELOW ARE SYMPTOMS THAT SHOULD BE REPORTED IMMEDIATELY:  *FEVER GREATER THAN 100.5 F  *CHILLS WITH OR WITHOUT FEVER  NAUSEA AND VOMITING THAT IS NOT CONTROLLED WITH YOUR NAUSEA MEDICATION  *UNUSUAL SHORTNESS OF BREATH  *UNUSUAL BRUISING OR BLEEDING  TENDERNESS IN MOUTH AND THROAT WITH OR WITHOUT PRESENCE OF ULCERS  *URINARY PROBLEMS  *BOWEL PROBLEMS  UNUSUAL RASH Items with * indicate a potential emergency and should be followed up as soon as possible.  One of the nurses will contact you 24 hours after your treatment. Please let the nurse know about any problems that you may have experienced. Feel free to call the clinic you have any questions or concerns. The clinic phone number is [phone].   I have been informed and understand all the instructions given to me. I know to contact the clinic, my physician, or go to the Emergency Department if any problems should occur. I do not have any questions at this time, but understand that I may call the clinic during office hours   should I have any questions or need assistance in obtaining follow up care.    __________________________________________  _____________  __________ Signature of Patient or Authorized Representative            Date                   Time    __________________________________________ Nurse's Signature

## 2012-04-26 ENCOUNTER — Other Ambulatory Visit: Payer: Self-pay | Admitting: Nurse Practitioner

## 2012-04-26 ENCOUNTER — Other Ambulatory Visit: Payer: Self-pay | Admitting: Oncology

## 2012-04-27 ENCOUNTER — Other Ambulatory Visit (HOSPITAL_BASED_OUTPATIENT_CLINIC_OR_DEPARTMENT_OTHER): Payer: Medicare Other | Admitting: Lab

## 2012-04-27 DIAGNOSIS — C9112 Chronic lymphocytic leukemia of B-cell type in relapse: Secondary | ICD-10-CM

## 2012-04-27 DIAGNOSIS — C9192 Lymphoid leukemia, unspecified, in relapse: Secondary | ICD-10-CM

## 2012-04-27 LAB — CBC WITH DIFFERENTIAL/PLATELET
Eosinophils Absolute: 0 10*3/uL (ref 0.0–0.5)
LYMPH%: 83 % — ABNORMAL HIGH (ref 14.0–49.7)
MCH: 40.4 pg — ABNORMAL HIGH (ref 25.1–34.0)
MCV: 114.4 fL — ABNORMAL HIGH (ref 79.5–101.0)
MONO%: 5 % (ref 0.0–14.0)
NEUT#: 1.2 10*3/uL — ABNORMAL LOW (ref 1.5–6.5)
Platelets: 94 10*3/uL — ABNORMAL LOW (ref 145–400)
RBC: 2.5 10*6/uL — ABNORMAL LOW (ref 3.70–5.45)
nRBC: 0 % (ref 0–0)

## 2012-04-29 ENCOUNTER — Other Ambulatory Visit: Payer: Self-pay | Admitting: *Deleted

## 2012-04-29 DIAGNOSIS — R42 Dizziness and giddiness: Secondary | ICD-10-CM

## 2012-04-29 MED ORDER — MECLIZINE HCL 12.5 MG PO TABS: 12.5000 mg | ORAL_TABLET | Freq: Three times a day (TID) | ORAL | Status: AC | PRN

## 2012-05-03 ENCOUNTER — Other Ambulatory Visit (HOSPITAL_BASED_OUTPATIENT_CLINIC_OR_DEPARTMENT_OTHER): Payer: Medicare Other | Admitting: Lab

## 2012-05-03 ENCOUNTER — Telehealth: Payer: Self-pay | Admitting: Oncology

## 2012-05-03 ENCOUNTER — Other Ambulatory Visit: Payer: Self-pay | Admitting: Oncology

## 2012-05-03 ENCOUNTER — Ambulatory Visit (HOSPITAL_BASED_OUTPATIENT_CLINIC_OR_DEPARTMENT_OTHER): Payer: Medicare Other | Admitting: Nurse Practitioner

## 2012-05-03 VITALS — BP 172/72 | HR 66 | Temp 98.0°F | Resp 20 | Ht 63.5 in | Wt 262.2 lb

## 2012-05-03 DIAGNOSIS — I10 Essential (primary) hypertension: Secondary | ICD-10-CM

## 2012-05-03 DIAGNOSIS — C9112 Chronic lymphocytic leukemia of B-cell type in relapse: Secondary | ICD-10-CM

## 2012-05-03 DIAGNOSIS — C9192 Lymphoid leukemia, unspecified, in relapse: Secondary | ICD-10-CM

## 2012-05-03 DIAGNOSIS — C50419 Malignant neoplasm of upper-outer quadrant of unspecified female breast: Secondary | ICD-10-CM

## 2012-05-03 LAB — CBC WITH DIFFERENTIAL/PLATELET
HCT: 30.5 % — ABNORMAL LOW (ref 34.8–46.6)
LYMPH%: 79.6 % — ABNORMAL HIGH (ref 14.0–49.7)
MCH: 42.3 pg — ABNORMAL HIGH (ref 25.1–34.0)
MCHC: 35 g/dL (ref 31.5–36.0)
NEUT%: 14.8 % — ABNORMAL LOW (ref 38.4–76.8)
Platelets: 84 10*3/uL — ABNORMAL LOW (ref 145–400)
lymph#: 7.6 10*3/uL — ABNORMAL HIGH (ref 0.9–3.3)

## 2012-05-03 LAB — TECHNOLOGIST REVIEW

## 2012-05-03 LAB — COMPREHENSIVE METABOLIC PANEL
Albumin: 4.2 g/dL (ref 3.5–5.2)
BUN: 19 mg/dL (ref 6–23)
CO2: 26 mEq/L (ref 19–32)
Calcium: 9.4 mg/dL (ref 8.4–10.5)
Chloride: 104 mEq/L (ref 96–112)
Glucose, Bld: 90 mg/dL (ref 70–99)
Potassium: 3.9 mEq/L (ref 3.5–5.3)
Sodium: 139 mEq/L (ref 135–145)
Total Protein: 6.1 g/dL (ref 6.0–8.3)

## 2012-05-03 LAB — LACTATE DEHYDROGENASE: LDH: 194 U/L (ref 94–250)

## 2012-05-03 NOTE — Progress Notes (Signed)
OFFICE PROGRESS NOTE  Interval history:  [name] is a 78 year old woman with well-differentiated lymphocytic lymphoma/CLL currently on active treatment with Ofatumumab. She completed the eighth weekly treatment on 04/20/2012. She is seen today for scheduled followup.  [name] reports that overall she is feeling well. She denies fevers. She has occasional night sweats. She estimates the sweats occur 1-2 times per week. No recent dizzy spells. When she has a dizzy spell she takes meclizine with improvement. She had a single episode of nausea/vomiting recently. She denies headaches. She is ambulating with a cane. She has a good appetite.   Objective: Blood pressure 172/72, pulse 66, temperature 98 F (36.7 C), temperature source Oral, resp. rate 20, height 5' 3.5" (1.613 m), weight 262 lb 3.2 oz (118.933 kg).  Oropharynx is without thrush or ulceration. No palpable cervical, supraclavicular or axillary lymph nodes. Status post left mastectomy. No evidence of chest wall recurrence. Lungs are clear. Regular cardiac rhythm. Abdomen is soft and nontender. Trace edema at the lower legs bilaterally. Calves are nontender. Motor strength 5 over 5.  Lab Results: Lab Results  Component Value Date   WBC 9.6 05/03/2012   HGB 10.7* 05/03/2012   HCT 30.5* 05/03/2012   MCV 120.9* 05/03/2012   PLT 84* 05/03/2012    Chemistry:    Chemistry      Component Value Date/Time   NA 139 03/29/2012 1200   K 4.1 03/29/2012 1200   CL 107 03/29/2012 1200   CO2 23 03/29/2012 1200   BUN 23 03/29/2012 1200   CREATININE 1.12* 03/29/2012 1200      Component Value Date/Time   CALCIUM 9.4 03/29/2012 1200   ALKPHOS 91 03/29/2012 1200   AST 19 03/29/2012 1200   ALT 28 03/29/2012 1200   BILITOT 0.6 03/29/2012 1200       Studies/Results: No results found.  Medications: I have reviewed the patient's current medications.  Assessment/Plan:  1. Well-differentiated lymphocytic lymphoma presenting with rapidly progressive pancytopenia in  November 2009. She was found to have left axillary lymphadenopathy and a mass in the left breast. Biopsy of the breast showed DCIS. However, the axillary node showed a lymphoma with immunophenotype consistent with WDLL versus CLL. Disease became rapidly progressive and she became transfusion dependent for red cells and platelets. She was treated with a series of different chemotherapy and immunotherapy agents, initially with fludarabine, Cytoxan and Rituxan, then Campath and ultimately she achieved a partial response to ofatumumab Suzan Nailer) initially given between April and June of 2010 and then monthly through October 2010. In May 2013 the blood counts began to deteriorate with a fall in the platelet count down to 59,000 and rise in the percent lymphocytes on the white count differential. Treatment with Ofatumumab was resumed. She completed the eighth weekly treatment on 04/20/2012. 2. Multifocal invasive and intraductal carcinoma, estrogen receptor positive, left breast. Initial biopsy at time of diagnosis of her lymphoma in September 2009. Definitive treatment delayed until her blood condition was under control. Subsequent left mastectomy with sentinel lymph node procedure 10/09/2010. Pathology with multifocal invasive cancer up to 2.6 cm with a separate area of DCIS 0.7 cm. Lymph nodes negative for breast cancer. ER/PR strongly positive, HER-2 negative. She was started on hormonal therapy with Aromasin, but was unable to tolerate and was switched to tamoxifen.  3. Essential hypertension. 4. Cerebrovascular disease status post stroke in September 2010. 5. Chronic dermatitis. 6. Intermittent allergic reactions to Ofatumumab. Pepcid added to the premedication regimen beginning 03/30/2012.  Disposition-[name]  appears well. She has completed 8 weekly treatments with ofatumumab. She will begin every 4 weeks for 4 treatments on 05/25/2012. She will return for a followup visit with Dr. Cyndie Chime on  06/15/2012. She will contact the office in the interim with any problems.  Plan reviewed with Dr. Cyndie Chime.  CC: Dr. Della Goo and Dr. Gardiner Ramus ANP/GNP-BC

## 2012-05-03 NOTE — Telephone Encounter (Signed)
Gave pt appt for lab and MD September 2013, gave Melissa chemo order for Aug, Sept, Oct and Nov. 2013

## 2012-05-04 ENCOUNTER — Other Ambulatory Visit: Payer: Medicare Other | Admitting: Lab

## 2012-05-06 ENCOUNTER — Encounter (INDEPENDENT_AMBULATORY_CARE_PROVIDER_SITE_OTHER): Payer: Self-pay | Admitting: General Surgery

## 2012-05-06 ENCOUNTER — Ambulatory Visit (INDEPENDENT_AMBULATORY_CARE_PROVIDER_SITE_OTHER): Payer: Medicare Other | Admitting: General Surgery

## 2012-05-06 VITALS — BP 147/100 | HR 88 | Temp 98.6°F | Resp 20 | Ht 64.0 in | Wt 264.0 lb

## 2012-05-06 DIAGNOSIS — C50919 Malignant neoplasm of unspecified site of unspecified female breast: Secondary | ICD-10-CM

## 2012-05-06 NOTE — Progress Notes (Signed)
Chief complaint: Followup breast cancer  History: Patient returns for long-term followup status post left total mastectomy in January 2012 for T2 N0 cancer of the left breast with negative sentinel lymph node biopsy. She is observation only. She is also followed for chronic lymphocytic leukemia. She reports some occasional funny sensations in her left chest wall. No arm swelling.  Exam: BP 147/100  Pulse 88  Temp 98.6 F (37 C) (Temporal)  Resp 20  Ht 5\' 4"  (1.626 m)  Wt 264 lb (119.75 kg)  BMI 45.32 kg/m2  Gen.: Moderately overweight African female in no distress Lymph nodes: No cervical, supraclavicular, or axillary nodes palpable Breasts: Right breast is negative to exam. No masses or skin changes or discharge. Left chest wall shows a well-healed incision without skin changes or nodules in the axilla is negative. No edema.  Imaging: Mammogram in early spring of the right breast was negative  Assessment and plan: Doing well with no evidence of recurrence or new cancer her clinical follow. Return in 6 months.

## 2012-05-10 ENCOUNTER — Encounter: Payer: Self-pay | Admitting: Oncology

## 2012-05-10 ENCOUNTER — Telehealth: Payer: Self-pay

## 2012-05-10 NOTE — Progress Notes (Signed)
Patient call me this morning needing to speak with [name] the social worker about housing.I told her that I would have Lauren to call her.

## 2012-05-10 NOTE — Telephone Encounter (Signed)
Per POF I have made appts. TMB 

## 2012-05-11 ENCOUNTER — Other Ambulatory Visit: Payer: Medicare Other | Admitting: Lab

## 2012-05-11 ENCOUNTER — Encounter: Payer: Self-pay | Admitting: *Deleted

## 2012-05-11 NOTE — Progress Notes (Signed)
[facility]  Clinical Social Work  Clinical Social Work was referred by financial advocate to contact patient for assistance with housing. CSW contacted patient by phone.  [name] states she is currently living in a three bedroom house with her daughter but identifies as very independent and would like to reside alone.  She states she has been unable to afford housing based on her income.  CSW provided patient information on [employer] and encouraged her to call the organization to determine affordable housing.  [name] states she does not feel she has any other needs at this time and plans to contact CSW with any other concerns.  Kathrin Penner, MSW, [facility] Clinical Social Worker [facility] [phone]

## 2012-05-12 ENCOUNTER — Other Ambulatory Visit: Payer: Self-pay | Admitting: Oncology

## 2012-05-12 DIAGNOSIS — C50919 Malignant neoplasm of unspecified site of unspecified female breast: Secondary | ICD-10-CM

## 2012-05-18 ENCOUNTER — Other Ambulatory Visit: Payer: Medicare Other | Admitting: Lab

## 2012-05-23 ENCOUNTER — Other Ambulatory Visit: Payer: Self-pay | Admitting: Oncology

## 2012-05-24 ENCOUNTER — Other Ambulatory Visit: Payer: Self-pay | Admitting: Oncology

## 2012-05-25 ENCOUNTER — Ambulatory Visit (HOSPITAL_BASED_OUTPATIENT_CLINIC_OR_DEPARTMENT_OTHER): Payer: Medicare Other

## 2012-05-25 ENCOUNTER — Other Ambulatory Visit (HOSPITAL_BASED_OUTPATIENT_CLINIC_OR_DEPARTMENT_OTHER): Payer: Medicare Other | Admitting: Lab

## 2012-05-25 VITALS — BP 193/80 | HR 65 | Temp 98.2°F | Resp 20

## 2012-05-25 DIAGNOSIS — C9192 Lymphoid leukemia, unspecified, in relapse: Secondary | ICD-10-CM

## 2012-05-25 DIAGNOSIS — C50419 Malignant neoplasm of upper-outer quadrant of unspecified female breast: Secondary | ICD-10-CM

## 2012-05-25 DIAGNOSIS — C9112 Chronic lymphocytic leukemia of B-cell type in relapse: Secondary | ICD-10-CM

## 2012-05-25 DIAGNOSIS — C8590 Non-Hodgkin lymphoma, unspecified, unspecified site: Secondary | ICD-10-CM

## 2012-05-25 DIAGNOSIS — Z5112 Encounter for antineoplastic immunotherapy: Secondary | ICD-10-CM

## 2012-05-25 LAB — CBC WITH DIFFERENTIAL/PLATELET
BASO%: 0.2 % (ref 0.0–2.0)
HCT: 31 % — ABNORMAL LOW (ref 34.8–46.6)
LYMPH%: 76.9 % — ABNORMAL HIGH (ref 14.0–49.7)
MCH: 39.6 pg — ABNORMAL HIGH (ref 25.1–34.0)
MCHC: 34.8 g/dL (ref 31.5–36.0)
MCV: 113.6 fL — ABNORMAL HIGH (ref 79.5–101.0)
MONO#: 0.7 10*3/uL (ref 0.1–0.9)
MONO%: 7 % (ref 0.0–14.0)
NEUT%: 15 % — ABNORMAL LOW (ref 38.4–76.8)
Platelets: 102 10*3/uL — ABNORMAL LOW (ref 145–400)
RBC: 2.73 10*6/uL — ABNORMAL LOW (ref 3.70–5.45)
WBC: 10.5 10*3/uL — ABNORMAL HIGH (ref 3.9–10.3)

## 2012-05-25 MED ORDER — SODIUM CHLORIDE 0.9 % IV SOLN
Freq: Once | INTRAVENOUS | Status: AC
  Administered 2012-05-25: 09:00:00 via INTRAVENOUS

## 2012-05-25 MED ORDER — ACETAMINOPHEN 500 MG PO TABS
1000.0000 mg | ORAL_TABLET | Freq: Once | ORAL | Status: AC
  Administered 2012-05-25: 1000 mg via ORAL

## 2012-05-25 MED ORDER — METHYLPREDNISOLONE SODIUM SUCC 125 MG IJ SOLR
125.0000 mg | Freq: Once | INTRAMUSCULAR | Status: AC
  Administered 2012-05-25: 125 mg via INTRAVENOUS

## 2012-05-25 MED ORDER — SODIUM CHLORIDE 0.9 % IV SOLN
2000.0000 mg | Freq: Once | INTRAVENOUS | Status: AC
  Administered 2012-05-25: 2000 mg via INTRAVENOUS
  Filled 2012-05-25: qty 100

## 2012-05-25 MED ORDER — FAMOTIDINE IN NACL 20-0.9 MG/50ML-% IV SOLN
20.0000 mg | Freq: Once | INTRAVENOUS | Status: AC
  Administered 2012-05-25: 20 mg via INTRAVENOUS

## 2012-05-25 MED ORDER — HEPARIN SOD (PORK) LOCK FLUSH 100 UNIT/ML IV SOLN
500.0000 [IU] | Freq: Once | INTRAVENOUS | Status: AC | PRN
  Administered 2012-05-25: 500 [IU]
  Filled 2012-05-25: qty 5

## 2012-05-25 MED ORDER — SODIUM CHLORIDE 0.9 % IJ SOLN
10.0000 mL | INTRAMUSCULAR | Status: DC | PRN
  Administered 2012-05-25: 10 mL
  Filled 2012-05-25: qty 10

## 2012-05-25 MED ORDER — DIPHENHYDRAMINE HCL 25 MG PO CAPS
50.0000 mg | ORAL_CAPSULE | Freq: Once | ORAL | Status: AC
  Administered 2012-05-25: 50 mg via ORAL

## 2012-05-25 NOTE — Patient Instructions (Signed)
[facility] Discharge Instructions for Patients Receiving Chemotherapy  Today you received the following chemotherapy agents Azerra To help prevent nausea and vomiting after your treatment, we encourage you to take your nausea medication as prescribed.  If you develop nausea and vomiting that is not controlled by your nausea medication, call the clinic. If it is after clinic hours your family physician or the after hours number for the clinic or go to the Emergency Department.   BELOW ARE SYMPTOMS THAT SHOULD BE REPORTED IMMEDIATELY:  *FEVER GREATER THAN 100.5 F  *CHILLS WITH OR WITHOUT FEVER  NAUSEA AND VOMITING THAT IS NOT CONTROLLED WITH YOUR NAUSEA MEDICATION  *UNUSUAL SHORTNESS OF BREATH  *UNUSUAL BRUISING OR BLEEDING  TENDERNESS IN MOUTH AND THROAT WITH OR WITHOUT PRESENCE OF ULCERS  *URINARY PROBLEMS  *BOWEL PROBLEMS  UNUSUAL RASH Items with * indicate a potential emergency and should be followed up as soon as possible.  One of the nurses will contact you 24 hours after your treatment. Please let the nurse know about any problems that you may have experienced. Feel free to call the clinic you have any questions or concerns. The clinic phone number is [phone].   I have been informed and understand all the instructions given to me. I know to contact the clinic, my physician, or go to the Emergency Department if any problems should occur. I do not have any questions at this time, but understand that I may call the clinic during office hours   should I have any questions or need assistance in obtaining follow up care.    __________________________________________  _____________  __________ Signature of Patient or Authorized Representative            Date                   Time    __________________________________________ Nurse's Signature    

## 2012-05-26 ENCOUNTER — Other Ambulatory Visit: Payer: Self-pay | Admitting: Certified Registered Nurse Anesthetist

## 2012-06-15 ENCOUNTER — Ambulatory Visit (HOSPITAL_BASED_OUTPATIENT_CLINIC_OR_DEPARTMENT_OTHER): Payer: Medicare Other | Admitting: Oncology

## 2012-06-15 ENCOUNTER — Ambulatory Visit (HOSPITAL_COMMUNITY)
Admission: RE | Admit: 2012-06-15 | Discharge: 2012-06-15 | Disposition: A | Discharge status: Home / Self Care | Payer: Medicare Other | Source: Ambulatory Visit | Attending: Oncology | Admitting: Oncology

## 2012-06-15 ENCOUNTER — Other Ambulatory Visit (HOSPITAL_BASED_OUTPATIENT_CLINIC_OR_DEPARTMENT_OTHER): Payer: Medicare Other | Admitting: Lab

## 2012-06-15 VITALS — BP 154/63 | HR 68 | Temp 98.0°F | Resp 22 | Ht 64.0 in | Wt 262.6 lb

## 2012-06-15 DIAGNOSIS — C50419 Malignant neoplasm of upper-outer quadrant of unspecified female breast: Secondary | ICD-10-CM

## 2012-06-15 DIAGNOSIS — Z23 Encounter for immunization: Secondary | ICD-10-CM

## 2012-06-15 DIAGNOSIS — C9112 Chronic lymphocytic leukemia of B-cell type in relapse: Secondary | ICD-10-CM

## 2012-06-15 DIAGNOSIS — C8300 Small cell B-cell lymphoma, unspecified site: Secondary | ICD-10-CM

## 2012-06-15 DIAGNOSIS — I10 Essential (primary) hypertension: Secondary | ICD-10-CM

## 2012-06-15 DIAGNOSIS — C50919 Malignant neoplasm of unspecified site of unspecified female breast: Secondary | ICD-10-CM

## 2012-06-15 DIAGNOSIS — C9192 Lymphoid leukemia, unspecified, in relapse: Secondary | ICD-10-CM

## 2012-06-15 DIAGNOSIS — R071 Chest pain on breathing: Secondary | ICD-10-CM | POA: Insufficient documentation

## 2012-06-15 LAB — COMPREHENSIVE METABOLIC PANEL (CC13)
ALT: 32 U/L (ref 0–55)
CO2: 24 mEq/L (ref 22–29)
Calcium: 9.5 mg/dL (ref 8.4–10.4)
Chloride: 108 mEq/L — ABNORMAL HIGH (ref 98–107)
Creatinine: 1.2 mg/dL — ABNORMAL HIGH (ref 0.6–1.1)
Glucose: 107 mg/dl — ABNORMAL HIGH (ref 70–99)
Total Protein: 6.4 g/dL (ref 6.4–8.3)

## 2012-06-15 LAB — CBC WITH DIFFERENTIAL/PLATELET
BASO%: 0.3 % (ref 0.0–2.0)
Eosinophils Absolute: 0.2 10*3/uL (ref 0.0–0.5)
HCT: 32.9 % — ABNORMAL LOW (ref 34.8–46.6)
HGB: 11.4 g/dL — ABNORMAL LOW (ref 11.6–15.9)
MCHC: 34.6 g/dL (ref 31.5–36.0)
MONO#: 0.6 10*3/uL (ref 0.1–0.9)
NEUT#: 1.4 10*3/uL — ABNORMAL LOW (ref 1.5–6.5)
NEUT%: 16 % — ABNORMAL LOW (ref 38.4–76.8)
Platelets: 111 10*3/uL — ABNORMAL LOW (ref 145–400)
WBC: 8.7 10*3/uL (ref 3.9–10.3)
lymph#: 6.4 10*3/uL — ABNORMAL HIGH (ref 0.9–3.3)

## 2012-06-15 LAB — TECHNOLOGIST REVIEW

## 2012-06-15 NOTE — Progress Notes (Signed)
Hematology and Oncology Follow Up Visit  [name] [id number] [birth year] 78 y.o. 06/15/2012 12:18 PM   Principle Diagnosis: Encounter Diagnoses  Name Primary?  . CLL (chronic lymphoid leukemia) in relapse Yes  . Well-differentiated lymphocytic lymphoma   . Breast cancer, stage 2      Interim History:    Followup visit for this pleasant but complicated 78 year old woman who presented with a concomitant diagnosis of left breast cancer and well-differentiated lymphocytic lymphoma/CLL in November 2009 when she presented with rapidly progressive pancytopenia with minimal lymphadenopathy limited to the left axilla. She was refractory to multiple initial treatments including fludarabine/Cytoxan/Rituxan, Campath, but did subsequently have a good partial response to Arzerra initially given between April and June of 2010 then monthly through October of 2010. Beginning in may of this year, blood counts began to deteriorate with fall in her platelet count down to 59,000 and rise in percent lymphocytes on her white count differential. She was started back on Arzerra on may 24th. She had a initial test dose of 300 mg and subsequent weekly 2000 mg doses given on June 10, June 20, and June 27. She has been getting significant allergic reactions to the product this time around. She developed diffuse facial erythema with the June 20 dose and towards the end of the June 27 dose, she developed vertigo and throat tightness in the infusion was discontinued. She was given additional steroids and Benadryl and the reaction subsided promptly. She has had multiple subsequent doses without any reactions. She will receive 3 more monthly doses to complete current planned course of therapy.  Once again she has had significant stabilization of her counts. I did a pretreatment restaging bone marrow evaluation on May 16. The bone marrow showed 69% lymphocytes in a diffuse pattern. There was dyserythropoiesis  but no ring  sideroblasts and increased iron in the marrow likely related to previous treatment effects. Flow cytometry with immunophenotyping was consistent with chronic lymphocytic leukemia. CBC today shows hemoglobin 11.4 hematocrit 32.9, white count 8700, 16% neutrophils, 75% lymphocytes, platelet count 111,000. MCV elevated at 119.  Previous neurologic symptoms with unexplained dizziness have resolved.  She continues to have atypical pulmonary symptoms. We did a CT scan of the chest back in May which was unremarkable. She reports discomfort when she takes a deep inspiration and when she exhales after deep inspiration. No PND. No orthopnea. She has had some increased leg swelling when she is up during the day which subsides at night.  Medications: reviewed  Allergies:  Allergies  Allergen Reactions  . Latex   . Penicillins     Review of Systems: Constitutional:   No constitutional symptoms Respiratory: See above Cardiovascular: No chest pain or palpitations  Gastrointestinal: No abdominal pain. Chronic constipation. She uses a daily laxative and drinks prune juice. Genito-Urinary: She complains of urinary frequency every 2 hours without any dysuria Musculoskeletal: No muscle or bone pain Neurologic: Currently no headache or change in vision Skin: No new rash, she has chronic nodular dermatitis Remaining ROS negative.  Physical Exam: Blood pressure 154/63, pulse 68, temperature 98 F (36.7 C), temperature source Oral, resp. rate 22, height 5\' 4"  (1.626 m), weight 262 lb 9.6 oz (119.115 kg). Wt Readings from Last 3 Encounters:  06/15/12 262 lb 9.6 oz (119.115 kg)  05/06/12 264 lb (119.75 kg)  05/03/12 262 lb 3.2 oz (118.933 kg)     General appearance: Well-nourished African American woman HENNT: Pharynx no erythema or exudate. Scar in the upper right neck  status post previous excision of a parotid tumor Lymph nodes: No palpable lymphadenopathy neck, supraclavicular, axillary, or inguinal  region Breasts: Left mastectomy. No chest wall lesions. No right breast masses Lungs: Clear to auscultation resonant to percussion Heart: Regular rhythm no murmur Abdomen: Soft obese nontender no mass no organomegaly Extremities: Trace edema no calf tenderness Vascular: No cyanosis Neurologic: No focal deficit Skin: Chronic hyperpigmented nodular dermatitis  Lab Results: Lab Results  Component Value Date   WBC 8.7 06/15/2012   HGB 11.4* 06/15/2012   HCT 32.9* 06/15/2012   MCV 119.1* 06/15/2012   PLT 111* 06/15/2012     Chemistry      Component Value Date/Time   NA 140 06/15/2012 1045   NA 139 05/03/2012 1106   K 4.2 06/15/2012 1045   K 3.9 05/03/2012 1106   CL 108* 06/15/2012 1045   CL 104 05/03/2012 1106   CO2 24 06/15/2012 1045   CO2 26 05/03/2012 1106   BUN 18.0 06/15/2012 1045   BUN 19 05/03/2012 1106   CREATININE 1.2* 06/15/2012 1045   CREATININE 1.02 05/03/2012 1106      Component Value Date/Time   CALCIUM 9.5 06/15/2012 1045   CALCIUM 9.4 05/03/2012 1106   ALKPHOS 85 06/15/2012 1045   ALKPHOS 77 05/03/2012 1106   AST 25 06/15/2012 1045   AST 24 05/03/2012 1106   ALT 32 06/15/2012 1045   ALT 26 05/03/2012 1106   BILITOT 0.60 06/15/2012 1045   BILITOT 0.5 05/03/2012 1106       Radiological Studies: Chest x-ray today pending   Impression and Plan: #1. Relapsed and resistant chronic lymphocytic leukemia/well-differentiated lymphocytic lymphoma. Once again she is achieved a nice partial response to single agent Arzerra. She will receive an additional 3 monthly doses and completed all planned treatment before Thanksgiving. We will continue to monitor count subsequent to that. There is some new drugs on the horizon, both in pill form, which look very promising. They're in advanced  phase 3 trials and hopefully will become available within the next year or 2. (Ibrutinib and Idelalisib)  #2. Initial noninvasive cancer left breast with subsequent invasive cancer ER positive status post left  mastectomy. She is back on tamoxifen at this time. She did not tolerate an aromatase inhibitor.  #3. Intermittent neurologic symptoms with history of a stroke in September 2010.  No acute change on recent MRI of the brain but still possible that she had another TIA. Current symptoms suggest vertebrobasilar insufficiency.  #4. Intermittent pleuritic chest pain. Etiology unclear. Symptoms have been intermittent. Negative CAT scan of the chest in May. I will get a regular chest x-ray today. #5. Essential hypertension. Borderline controlled on current medications.  #6. Chronic nodular dermatitis  Miscellaneous: We will give her a flu vaccine today.    CC:.    Levert Feinstein, MD 9/17/201312:18 PM

## 2012-06-16 ENCOUNTER — Telehealth: Payer: Self-pay | Admitting: Oncology

## 2012-06-16 NOTE — Telephone Encounter (Signed)
Emailed [facility] regarding chemo times

## 2012-06-16 NOTE — Telephone Encounter (Signed)
Talked to patient daughter, she will get calendar appt on 06/22/12

## 2012-06-17 ENCOUNTER — Telehealth: Payer: Self-pay | Admitting: *Deleted

## 2012-06-17 NOTE — Telephone Encounter (Signed)
Message left on daughter, [name] identified vm that [name]'s CXR was normal & asked to call back to verify message received.

## 2012-06-17 NOTE — Telephone Encounter (Signed)
Message copied by Sabino Snipes on Thu Jun 17, 2012 11:11 AM ------      Message from: Levert Feinstein      Created: Tue Jun 15, 2012  9:07 PM       Call pt CXR normal

## 2012-06-21 ENCOUNTER — Telehealth: Payer: Self-pay | Admitting: Oncology

## 2012-06-21 NOTE — Telephone Encounter (Signed)
Called pt and left message regarding appt for tomorrow and advised patient calendar for October 2013

## 2012-06-22 ENCOUNTER — Other Ambulatory Visit (HOSPITAL_BASED_OUTPATIENT_CLINIC_OR_DEPARTMENT_OTHER): Payer: Medicare Other | Admitting: Lab

## 2012-06-22 ENCOUNTER — Ambulatory Visit (HOSPITAL_BASED_OUTPATIENT_CLINIC_OR_DEPARTMENT_OTHER): Payer: Medicare Other

## 2012-06-22 VITALS — BP 199/77 | HR 68 | Temp 98.0°F | Resp 20

## 2012-06-22 DIAGNOSIS — C9112 Chronic lymphocytic leukemia of B-cell type in relapse: Secondary | ICD-10-CM

## 2012-06-22 DIAGNOSIS — C8590 Non-Hodgkin lymphoma, unspecified, unspecified site: Secondary | ICD-10-CM

## 2012-06-22 DIAGNOSIS — Z5112 Encounter for antineoplastic immunotherapy: Secondary | ICD-10-CM

## 2012-06-22 DIAGNOSIS — C9192 Lymphoid leukemia, unspecified, in relapse: Secondary | ICD-10-CM

## 2012-06-22 LAB — CBC WITH DIFFERENTIAL/PLATELET
Basophils Absolute: 0 10*3/uL (ref 0.0–0.1)
EOS%: 1.7 % (ref 0.0–7.0)
HCT: 32.3 % — ABNORMAL LOW (ref 34.8–46.6)
HGB: 11 g/dL — ABNORMAL LOW (ref 11.6–15.9)
LYMPH%: 75.9 % — ABNORMAL HIGH (ref 14.0–49.7)
MCH: 38.9 pg — ABNORMAL HIGH (ref 25.1–34.0)
MCV: 114.1 fL — ABNORMAL HIGH (ref 79.5–101.0)
MONO%: 8 % (ref 0.0–14.0)
NEUT%: 14.2 % — ABNORMAL LOW (ref 38.4–76.8)

## 2012-06-22 MED ORDER — ACETAMINOPHEN 500 MG PO TABS
1000.0000 mg | ORAL_TABLET | Freq: Once | ORAL | Status: AC
  Administered 2012-06-22: 1000 mg via ORAL

## 2012-06-22 MED ORDER — SODIUM CHLORIDE 0.9 % IJ SOLN
10.0000 mL | INTRAMUSCULAR | Status: DC | PRN
  Administered 2012-06-22: 10 mL
  Filled 2012-06-22: qty 10

## 2012-06-22 MED ORDER — HEPARIN SOD (PORK) LOCK FLUSH 100 UNIT/ML IV SOLN
500.0000 [IU] | Freq: Once | INTRAVENOUS | Status: AC | PRN
  Administered 2012-06-22: 500 [IU]
  Filled 2012-06-22: qty 5

## 2012-06-22 MED ORDER — METHYLPREDNISOLONE SODIUM SUCC 125 MG IJ SOLR
125.0000 mg | Freq: Once | INTRAMUSCULAR | Status: AC
  Administered 2012-06-22: 125 mg via INTRAVENOUS

## 2012-06-22 MED ORDER — FAMOTIDINE IN NACL 20-0.9 MG/50ML-% IV SOLN
20.0000 mg | Freq: Once | INTRAVENOUS | Status: AC
  Administered 2012-06-22: 20 mg via INTRAVENOUS

## 2012-06-22 MED ORDER — SODIUM CHLORIDE 0.9 % IV SOLN
2000.0000 mg | Freq: Once | INTRAVENOUS | Status: AC
  Administered 2012-06-22: 2000 mg via INTRAVENOUS
  Filled 2012-06-22: qty 100

## 2012-06-22 MED ORDER — SODIUM CHLORIDE 0.9 % IV SOLN
Freq: Once | INTRAVENOUS | Status: AC
  Administered 2012-06-22: 10:00:00 via INTRAVENOUS

## 2012-06-22 MED ORDER — DIPHENHYDRAMINE HCL 25 MG PO CAPS
50.0000 mg | ORAL_CAPSULE | Freq: Once | ORAL | Status: AC
  Administered 2012-06-22: 50 mg via ORAL

## 2012-06-22 NOTE — Patient Instructions (Signed)
[facility] Discharge Instructions for Patients Receiving Chemotherapy  Today you received the following chemotherapy agents Arzerra  To help prevent nausea and vomiting after your treatment, we encourage you to take your nausea medication as directed by MD  If you develop nausea and vomiting that is not controlled by your nausea medication, call the clinic. If it is after clinic hours your family physician or the after hours number for the clinic or go to the Emergency Department.   BELOW ARE SYMPTOMS THAT SHOULD BE REPORTED IMMEDIATELY:  *FEVER GREATER THAN 100.5 F  *CHILLS WITH OR WITHOUT FEVER  NAUSEA AND VOMITING THAT IS NOT CONTROLLED WITH YOUR NAUSEA MEDICATION  *UNUSUAL SHORTNESS OF BREATH  *UNUSUAL BRUISING OR BLEEDING  TENDERNESS IN MOUTH AND THROAT WITH OR WITHOUT PRESENCE OF ULCERS  *URINARY PROBLEMS  *BOWEL PROBLEMS  UNUSUAL RASH Items with * indicate a potential emergency and should be followed up as soon as possible.  One of the nurses will contact you 24 hours after your treatment. Please let the nurse know about any problems that you may have experienced. Feel free to call the clinic you have any questions or concerns. The clinic phone number is [phone].   I have been informed and understand all the instructions given to me. I know to contact the clinic, my physician, or go to the Emergency Department if any problems should occur. I do not have any questions at this time, but understand that I may call the clinic during office hours   should I have any questions or need assistance in obtaining follow up care.    __________________________________________  _____________  __________ Signature of Patient or Authorized Representative            Date                   Time    __________________________________________ Nurse's Signature

## 2012-07-13 ENCOUNTER — Other Ambulatory Visit: Payer: Self-pay | Admitting: Oncology

## 2012-07-20 ENCOUNTER — Other Ambulatory Visit (HOSPITAL_BASED_OUTPATIENT_CLINIC_OR_DEPARTMENT_OTHER): Payer: Medicare Other | Admitting: Lab

## 2012-07-20 ENCOUNTER — Ambulatory Visit (HOSPITAL_BASED_OUTPATIENT_CLINIC_OR_DEPARTMENT_OTHER): Payer: Medicare Other

## 2012-07-20 ENCOUNTER — Ambulatory Visit (HOSPITAL_BASED_OUTPATIENT_CLINIC_OR_DEPARTMENT_OTHER): Payer: Medicare Other | Admitting: Nurse Practitioner

## 2012-07-20 VITALS — BP 171/77 | HR 64 | Temp 98.2°F | Resp 20

## 2012-07-20 VITALS — BP 162/69 | HR 59 | Temp 97.0°F | Resp 20 | Ht 64.0 in | Wt 268.3 lb

## 2012-07-20 DIAGNOSIS — C9192 Lymphoid leukemia, unspecified, in relapse: Secondary | ICD-10-CM

## 2012-07-20 DIAGNOSIS — C9112 Chronic lymphocytic leukemia of B-cell type in relapse: Secondary | ICD-10-CM

## 2012-07-20 DIAGNOSIS — Z5112 Encounter for antineoplastic immunotherapy: Secondary | ICD-10-CM

## 2012-07-20 DIAGNOSIS — C8300 Small cell B-cell lymphoma, unspecified site: Secondary | ICD-10-CM

## 2012-07-20 DIAGNOSIS — C8590 Non-Hodgkin lymphoma, unspecified, unspecified site: Secondary | ICD-10-CM

## 2012-07-20 DIAGNOSIS — I10 Essential (primary) hypertension: Secondary | ICD-10-CM

## 2012-07-20 DIAGNOSIS — C50919 Malignant neoplasm of unspecified site of unspecified female breast: Secondary | ICD-10-CM

## 2012-07-20 LAB — CBC WITH DIFFERENTIAL/PLATELET
BASO%: 0.1 % (ref 0.0–2.0)
EOS%: 1.5 % (ref 0.0–7.0)
HCT: 34.5 % — ABNORMAL LOW (ref 34.8–46.6)
LYMPH%: 70.4 % — ABNORMAL HIGH (ref 14.0–49.7)
MCH: 38.6 pg — ABNORMAL HIGH (ref 25.1–34.0)
MCHC: 34.5 g/dL (ref 31.5–36.0)
NEUT%: 20.6 % — ABNORMAL LOW (ref 38.4–76.8)
Platelets: 100 10*3/uL — ABNORMAL LOW (ref 145–400)

## 2012-07-20 LAB — COMPREHENSIVE METABOLIC PANEL (CC13)
AST: 20 U/L (ref 5–34)
Alkaline Phosphatase: 85 U/L (ref 40–150)
BUN: 19 mg/dL (ref 7.0–26.0)
Calcium: 9.6 mg/dL (ref 8.4–10.4)
Chloride: 107 mEq/L (ref 98–107)
Creatinine: 1.2 mg/dL — ABNORMAL HIGH (ref 0.6–1.1)
Glucose: 108 mg/dl — ABNORMAL HIGH (ref 70–99)

## 2012-07-20 LAB — TECHNOLOGIST REVIEW

## 2012-07-20 MED ORDER — DIPHENHYDRAMINE HCL 25 MG PO CAPS
50.0000 mg | ORAL_CAPSULE | Freq: Once | ORAL | Status: AC
  Administered 2012-07-20: 50 mg via ORAL

## 2012-07-20 MED ORDER — ACETAMINOPHEN 500 MG PO TABS
1000.0000 mg | ORAL_TABLET | Freq: Once | ORAL | Status: AC
  Administered 2012-07-20: 1000 mg via ORAL

## 2012-07-20 MED ORDER — SODIUM CHLORIDE 0.9 % IJ SOLN
10.0000 mL | INTRAMUSCULAR | Status: DC | PRN
  Filled 2012-07-20: qty 10

## 2012-07-20 MED ORDER — METHYLPREDNISOLONE SODIUM SUCC 125 MG IJ SOLR
125.0000 mg | Freq: Once | INTRAMUSCULAR | Status: AC
  Administered 2012-07-20: 125 mg via INTRAVENOUS

## 2012-07-20 MED ORDER — HEPARIN SOD (PORK) LOCK FLUSH 100 UNIT/ML IV SOLN
500.0000 [IU] | Freq: Once | INTRAVENOUS | Status: DC | PRN
  Filled 2012-07-20: qty 5

## 2012-07-20 MED ORDER — SODIUM CHLORIDE 0.9 % IV SOLN
2000.0000 mg | Freq: Once | INTRAVENOUS | Status: AC
  Administered 2012-07-20: 2000 mg via INTRAVENOUS
  Filled 2012-07-20: qty 100

## 2012-07-20 MED ORDER — FAMOTIDINE IN NACL 20-0.9 MG/50ML-% IV SOLN
20.0000 mg | Freq: Once | INTRAVENOUS | Status: AC
  Administered 2012-07-20: 20 mg via INTRAVENOUS

## 2012-07-20 MED ORDER — POTASSIUM CHLORIDE CRYS ER 20 MEQ PO TBCR: 20.0000 meq | EXTENDED_RELEASE_TABLET | Freq: Every day | ORAL | Status: DC

## 2012-07-20 MED ORDER — SODIUM CHLORIDE 0.9 % IV SOLN
Freq: Once | INTRAVENOUS | Status: AC
  Administered 2012-07-20: 10:00:00 via INTRAVENOUS

## 2012-07-20 NOTE — Progress Notes (Signed)
OFFICE PROGRESS NOTE  Interval history:  [name] is a 78 year old woman with well-differentiated lymphocytic lymphoma/CLL. She is currently on active treatment with Ofatumumab with 2 additional monthly treatments scheduled to complete the planned course of therapy. She also has a history of initial noninvasive cancer of the left breast with subsequent invasive cancer, ER positive, status post left mastectomy currently on tamoxifen. She is seen today for scheduled followup.  [name] reports she is feeling well. No interim illnesses or infections. She denies fevers. She has periodic sweats. No signs of allergic reaction with the most recent Ofatumumab infusion. She continues tamoxifen. She denies vaginal bleeding. No shortness of breath or cough. She has intermittent constipation. She estimates having 3 "dizzy spells" since her last visit.  Over the past year she has noticed intermittent problems with short-term memory.  Objective: Blood pressure 162/69, pulse 59, temperature 97 F (36.1 C), temperature source Oral, resp. rate 20, height 5\' 4"  (1.626 m), weight 268 lb 4.8 oz (121.7 kg).  Oropharynx is without thrush or ulceration. No palpable cervical, supraclavicular or axillary lymph nodes. Breast exam not performed. Lungs clear. Regular cardiac rhythm. Port-A-Cath site is without erythema. Abdomen is soft, obese. Nontender. Trace to 1+ pitting edema at the lower legs bilaterally. Calves are nontender. Motor strength 5 over 5.  Lab Results: Lab Results  Component Value Date   WBC 9.2 07/20/2012   HGB 11.9 07/20/2012   HCT 34.5* 07/20/2012   MCV 112.0* 07/20/2012   PLT 100* 07/20/2012    Chemistry:    Chemistry      Component Value Date/Time   NA 140 06/15/2012 1045   NA 139 05/03/2012 1106   K 4.2 06/15/2012 1045   K 3.9 05/03/2012 1106   CL 108* 06/15/2012 1045   CL 104 05/03/2012 1106   CO2 24 06/15/2012 1045   CO2 26 05/03/2012 1106   BUN 18.0 06/15/2012 1045   BUN 19 05/03/2012 1106   CREATININE 1.2* 06/15/2012 1045   CREATININE 1.02 05/03/2012 1106      Component Value Date/Time   CALCIUM 9.5 06/15/2012 1045   CALCIUM 9.4 05/03/2012 1106   ALKPHOS 85 06/15/2012 1045   ALKPHOS 77 05/03/2012 1106   AST 25 06/15/2012 1045   AST 24 05/03/2012 1106   ALT 32 06/15/2012 1045   ALT 26 05/03/2012 1106   BILITOT 0.60 06/15/2012 1045   BILITOT 0.5 05/03/2012 1106       Studies/Results: No results found.  Medications: I have reviewed the patient's current medications.  Assessment/Plan:  1. Well-differentiated lymphocytic lymphoma presenting with rapidly progressive pancytopenia in November 2009. She was found to have left axillary lymphadenopathy and a mass in the left breast. Biopsy of the breast showed DCIS. However, the axillary node showed a lymphoma with immunophenotype consistent with WDLL versus CLL. Disease became rapidly progressive and she became transfusion dependent for red cells and platelets. She was treated with a series of different chemotherapy and immunotherapy agents, initially with fludarabine, Cytoxan and Rituxan, then Campath and ultimately she achieved a partial response to ofatumumab [name]) initially given between April and June of 2010 and then monthly through October 2010. In May 2013 the blood counts began to deteriorate with a fall in the platelet count down to 59,000 and rise in the percent lymphocytes on the white count differential. Treatment with Ofatumumab was resumed. She completed the eighth weekly treatment on 04/20/2012. She began the monthly treatment schedule on 05/25/2012. She has had a partial response to Ofatumumab.  2. Initial noninvasive cancers left breast with subsequent invasive cancer ER positive status post left mastectomy. Initial biopsy at time of diagnosis of her lymphoma in September 2009. Definitive treatment delayed until her blood condition was under control. Subsequent left mastectomy with sentinel lymph node procedure 10/09/2010. Pathology  with multifocal invasive cancer up to 2.6 cm with a separate area of DCIS 0.7 cm. Lymph nodes negative for breast cancer. ER/PR strongly positive, HER-2 negative. She was started on hormonal therapy with Aromasin, but was unable to tolerate and was switched to tamoxifen. She continues tamoxifen. 3. Essential hypertension. 4. Cerebrovascular disease status post stroke in September 2010. 5. Chronic dermatitis. 6. Intermittent allergic reactions to Ofatumumab. Pepcid added to the premedication regimen beginning 03/30/2012. She has had multiple subsequent doses without any reaction.  Disposition-[name] appears stable. Plan to proceed with the next to last planned treatment with ofatumumab today as scheduled. She will return for a followup visit with Dr. Cyndie Chime in one month. She will contact the office in the interim with any problems.   Plan reviewed with Dr. Cyndie Chime.  Lonna Cobb ANP/GNP-BC

## 2012-07-21 ENCOUNTER — Telehealth: Payer: Self-pay | Admitting: Oncology

## 2012-07-21 NOTE — Telephone Encounter (Signed)
Called [place] to establish care with Dr. Felicity Coyer, first opening for new patient is April 2014 , notified nurse and waiting for answer

## 2012-07-22 ENCOUNTER — Telehealth: Payer: Self-pay | Admitting: Oncology

## 2012-07-22 NOTE — Telephone Encounter (Signed)
Called pt and left message regarding appt with primary MD [address] , Dr Felicity Coyer

## 2012-08-17 ENCOUNTER — Ambulatory Visit (HOSPITAL_BASED_OUTPATIENT_CLINIC_OR_DEPARTMENT_OTHER): Payer: Medicare Other | Admitting: Oncology

## 2012-08-17 ENCOUNTER — Ambulatory Visit (HOSPITAL_BASED_OUTPATIENT_CLINIC_OR_DEPARTMENT_OTHER): Payer: Medicare Other

## 2012-08-17 ENCOUNTER — Other Ambulatory Visit (HOSPITAL_BASED_OUTPATIENT_CLINIC_OR_DEPARTMENT_OTHER): Payer: Medicare Other | Admitting: Lab

## 2012-08-17 ENCOUNTER — Other Ambulatory Visit: Payer: Self-pay | Admitting: Oncology

## 2012-08-17 VITALS — BP 194/59 | HR 61 | Temp 97.8°F | Resp 20 | Ht 63.5 in | Wt 268.7 lb

## 2012-08-17 VITALS — BP 194/81 | HR 80 | Temp 97.8°F | Resp 18

## 2012-08-17 DIAGNOSIS — C9112 Chronic lymphocytic leukemia of B-cell type in relapse: Secondary | ICD-10-CM

## 2012-08-17 DIAGNOSIS — C9192 Lymphoid leukemia, unspecified, in relapse: Secondary | ICD-10-CM

## 2012-08-17 DIAGNOSIS — C8300 Small cell B-cell lymphoma, unspecified site: Secondary | ICD-10-CM

## 2012-08-17 DIAGNOSIS — C8589 Other specified types of non-Hodgkin lymphoma, extranodal and solid organ sites: Secondary | ICD-10-CM

## 2012-08-17 DIAGNOSIS — Z5112 Encounter for antineoplastic immunotherapy: Secondary | ICD-10-CM

## 2012-08-17 DIAGNOSIS — C50919 Malignant neoplasm of unspecified site of unspecified female breast: Secondary | ICD-10-CM

## 2012-08-17 DIAGNOSIS — C8590 Non-Hodgkin lymphoma, unspecified, unspecified site: Secondary | ICD-10-CM

## 2012-08-17 DIAGNOSIS — C8599 Non-Hodgkin lymphoma, unspecified, extranodal and solid organ sites: Secondary | ICD-10-CM

## 2012-08-17 LAB — TECHNOLOGIST REVIEW

## 2012-08-17 LAB — COMPREHENSIVE METABOLIC PANEL (CC13)
ALT: 22 U/L (ref 0–55)
AST: 20 U/L (ref 5–34)
CO2: 23 mEq/L (ref 22–29)
Chloride: 109 mEq/L — ABNORMAL HIGH (ref 98–107)
Potassium: 3.6 mEq/L (ref 3.5–5.1)
Sodium: 140 mEq/L (ref 136–145)
Total Protein: 6.5 g/dL (ref 6.4–8.3)

## 2012-08-17 LAB — CBC WITH DIFFERENTIAL/PLATELET
BASO%: 0.3 % (ref 0.0–2.0)
Eosinophils Absolute: 0.1 10*3/uL (ref 0.0–0.5)
LYMPH%: 74.1 % — ABNORMAL HIGH (ref 14.0–49.7)
MONO#: 0.8 10*3/uL (ref 0.1–0.9)
NEUT#: 1.5 10*3/uL (ref 1.5–6.5)
Platelets: 93 10*3/uL — ABNORMAL LOW (ref 145–400)
RBC: 3.2 10*6/uL — ABNORMAL LOW (ref 3.70–5.45)
RDW: 13.5 % (ref 11.2–14.5)
WBC: 9.4 10*3/uL (ref 3.9–10.3)
lymph#: 7 10*3/uL — ABNORMAL HIGH (ref 0.9–3.3)
nRBC: 0 % (ref 0–0)

## 2012-08-17 MED ORDER — ACETAMINOPHEN 500 MG PO TABS
1000.0000 mg | ORAL_TABLET | Freq: Once | ORAL | Status: AC
  Administered 2012-08-17: 1000 mg via ORAL

## 2012-08-17 MED ORDER — DIPHENHYDRAMINE HCL 25 MG PO CAPS
50.0000 mg | ORAL_CAPSULE | Freq: Once | ORAL | Status: AC
  Administered 2012-08-17: 50 mg via ORAL

## 2012-08-17 MED ORDER — FAMOTIDINE IN NACL 20-0.9 MG/50ML-% IV SOLN
20.0000 mg | Freq: Once | INTRAVENOUS | Status: AC
  Administered 2012-08-17: 20 mg via INTRAVENOUS

## 2012-08-17 MED ORDER — SODIUM CHLORIDE 0.9 % IV SOLN
Freq: Once | INTRAVENOUS | Status: AC
  Administered 2012-08-17: 11:00:00 via INTRAVENOUS

## 2012-08-17 MED ORDER — METHYLPREDNISOLONE SODIUM SUCC 125 MG IJ SOLR
125.0000 mg | Freq: Once | INTRAMUSCULAR | Status: AC
  Administered 2012-08-17: 125 mg via INTRAVENOUS

## 2012-08-17 MED ORDER — HEPARIN SOD (PORK) LOCK FLUSH 100 UNIT/ML IV SOLN
500.0000 [IU] | Freq: Once | INTRAVENOUS | Status: AC | PRN
  Administered 2012-08-17: 500 [IU]
  Filled 2012-08-17: qty 5

## 2012-08-17 MED ORDER — SODIUM CHLORIDE 0.9 % IJ SOLN
10.0000 mL | INTRAMUSCULAR | Status: DC | PRN
  Administered 2012-08-17: 10 mL
  Filled 2012-08-17: qty 10

## 2012-08-17 MED ORDER — SODIUM CHLORIDE 0.9 % IV SOLN
2000.0000 mg | Freq: Once | INTRAVENOUS | Status: AC
  Administered 2012-08-17: 2000 mg via INTRAVENOUS
  Filled 2012-08-17: qty 100

## 2012-08-17 NOTE — Telephone Encounter (Deleted)
appts made and printed for pt aom °

## 2012-08-17 NOTE — Progress Notes (Signed)
Hematology and Oncology Follow Up Visit  [name] [id number] 01-Sep-1934 78 y.o. 08/17/2012 12:14 PM   Principle Diagnosis: Encounter Diagnoses  Name Primary?  . Breast cancer left T2N0 S/P mastectomy and SLN Bx 09/2010   . CLL (chronic lymphoid leukemia) in relapse Yes  . Low grade malignant lymphoma      Interim History:   [name] completes a course of Arzerra antibody therapy for relapsed CLL/well-differentiated lymphocytic lymphoma today. She had some minor infusion reactions that were mitigated by addition of an H2 blocker to the steroids. Treatments were given between 5/24 and 08/17/2012. At initiation of treatments, her platelet count had dipped to a nadir of 57,000. Rapidly progressive, cytopenia was the initial presentation of her disease. Platelet count has come back up to the 90-100,000 range. Hemoglobin has remained stable at 11 g. Total white count down from 15,000 in June (which actually made reflect the high-dose steroid premedication) to 9000. White count differential has not changed much with 70-75% lymphocytes and 15-20% neutrophils.  Overall she is doing well. She has had no interim fever or infection. Some of her atypical neurologic symptoms have subsided significantly.   Medications: reviewed  Allergies:  Allergies  Allergen Reactions  . Latex   . Penicillins     Review of Systems: Constitutional:   No constitutional symptoms Respiratory: No cough or dyspnea Cardiovascular:  No chest pain or palpitations Gastrointestinal: No abdominal pain or change in bowel habit Genito-Urinary: No urinary tract symptoms Musculoskeletal: No muscle or bone pain Neurologic: No new neurologic symptoms Skin: Chronic dermatitis currently stable Remaining ROS negative.  Physical Exam: Blood pressure 194/59, pulse 61, temperature 97.8 F (36.6 C), temperature source Oral, resp. rate 20, height 5' 3.5" (1.613 m), weight 268 lb 11.2 oz (121.882 kg). Wt Readings from Last  3 Encounters:  08/17/12 268 lb 11.2 oz (121.882 kg)  07/20/12 268 lb 4.8 oz (121.7 kg)  06/15/12 262 lb 9.6 oz (119.115 kg)     General appearance: Pleasant, obese, African American woman HENNT: Pharynx no erythema or exudate Lymph nodes: No cervical, supraclavicular, or axillary adenopathy Breasts: Left mastectomy Lungs: Clear to auscultation resonant to percussion Heart: Regular rhythm  one-2/6 murmur left sternal border Abdomen: Soft, nontender, no mass, no obvious organomegaly Extremities: One-2+ ankle edema Vascular: No cyanosis Neurologic: Mental status intact, PERRLA, motor strength 5 over 5, reflexes absent symmetric at the knees 1+ symmetric at the biceps Skin: Chronic dermatitis stable  Lab Results: Lab Results: White count differential 74% lymphocytes, 16% neutrophils, 8% monocytes   Component Value Date   WBC 9.4 08/17/2012   HGB 12.3 08/17/2012   HCT 35.2 08/17/2012   MCV 110.0* 08/17/2012   PLT 93* 08/17/2012     Chemistry      Component Value Date/Time   NA 138 07/20/2012 0855   NA 139 05/03/2012 1106   K 4.1 07/20/2012 0855   K 3.9 05/03/2012 1106   CL 107 07/20/2012 0855   CL 104 05/03/2012 1106   CO2 21* 07/20/2012 0855   CO2 26 05/03/2012 1106   BUN 19.0 07/20/2012 0855   BUN 19 05/03/2012 1106   CREATININE 1.2* 07/20/2012 0855   CREATININE 1.02 05/03/2012 1106      Component Value Date/Time   CALCIUM 9.6 07/20/2012 0855   CALCIUM 9.4 05/03/2012 1106   ALKPHOS 85 07/20/2012 0855   ALKPHOS 77 05/03/2012 1106   AST 20 07/20/2012 0855   AST 24 05/03/2012 1106   ALT 27 07/20/2012 0855  ALT 26 05/03/2012 1106   BILITOT 0.60 07/20/2012 0855   BILITOT 0.5 05/03/2012 1106      Impression and Plan: #1. Multiply relapsed chronic lymphocytic leukemia/well-differentiated lymphocytic lymphoma initial diagnosis November 2009. Refractory to initial treatment including fludarabine, Cytoxan, Rituxan and subsequently Campath anti-CD57 antibody. Excellent partial response to  Arzerra given between April and June 2010 then monthly through October 2010. Fall in platelet count May 2013. Repeat course of our 0 between May and November 2013 with improvement in platelet count and stable hemoglobin and white count. Although white count differential has not normalized, it has remained stable with 15-20% neutrophils. Plan: I discussed with her and her daughter today that we have some new agents active for relapsed CLL. There is increasing data to support the use of Revlimid with or without Rituxan. We reviewed the use of Revlimid and potential side effects today. 2 weeks ago a third generation anti-CD20 antibody was approved for relapsed CLL, Obinatumumab. This works by a slightly different mechanism with respect to increase antibody dependent cellular toxicity compared with the previous 2 generations of anti-CD20 molecules. This would be another option if needed. In addition, Ibrutinib, an antibody that is involved with inhibiting abnormal B cell growth signals, was approved last week for relapsed mantle cell lymphoma. This is where it has had its highest activity but it also appears to be very active in lymphoma and CLL and should be approved for this indication within the next year. There are 2 other drugs that are in phase 3 trials that looked promising a PIK inhibiotor, Idelalisib, and anti PD-1 antibodies. For now, since she shown nice stabilization with a repeat course of Arzerra, I will just monitor her blood counts on a monthly basis. She is reassured that we do have options if disease continues to progress. She understands that this is not a curable situation and that we will need to readdress treatment again in the future.  #2. Initial noninvasive cancer left breast with subsequent invasive cancer ER positive status post left mastectomy. She is back on tamoxifen at this time. She did not tolerate an aromatase inhibitor.   #3. Intermittent neurologic symptoms with history of a  stroke in September 2010.  No acute change on recent MRI of the brain but still possible that she had another TIA. Current symptoms suggest vertebrobasilar insufficiency.   #4. Intermittent pleuritic chest pain. Etiology unclear. Symptoms currently resolved.   #5. Essential hypertension. Borderline controlled on current medications.   #6. Chronic nodular dermatitis    CC:. Dr. Della Goo; Dr. Wilfrid Lund      CC:.    Levert Feinstein, MD 11/19/201312:14 PM

## 2012-08-17 NOTE — Patient Instructions (Signed)
[facility] Discharge Instructions for Patients Receiving Chemotherapy  Today you received the following chemotherapy agents Axerra   To help prevent nausea and vomiting after your treatment, we encourage you to take your nausea medication.   If you develop nausea and vomiting that is not controlled by your nausea medication, call the clinic. If it is after clinic hours your family physician or the after hours number for the clinic or go to the Emergency Department.   BELOW ARE SYMPTOMS THAT SHOULD BE REPORTED IMMEDIATELY:  *FEVER GREATER THAN 100.5 F  *CHILLS WITH OR WITHOUT FEVER  NAUSEA AND VOMITING THAT IS NOT CONTROLLED WITH YOUR NAUSEA MEDICATION  *UNUSUAL SHORTNESS OF BREATH  *UNUSUAL BRUISING OR BLEEDING  TENDERNESS IN MOUTH AND THROAT WITH OR WITHOUT PRESENCE OF ULCERS  *URINARY PROBLEMS  *BOWEL PROBLEMS  UNUSUAL RASH Items with * indicate a potential emergency and should be followed up as soon as possible.  One of the nurses will contact you 24 hours after your treatment. Please let the nurse know about any problems that you may have experienced. Feel free to call the clinic you have any questions or concerns. The clinic phone number is [phone].   I have been informed and understand all the instructions given to me. I know to contact the clinic, my physician, or go to the Emergency Department if any problems should occur. I do not have any questions at this time, but understand that I may call the clinic during office hours   should I have any questions or need assistance in obtaining follow up care.    __________________________________________  _____________  __________ Signature of Patient or Authorized Representative            Date                   Time    __________________________________________ Nurse's Signature

## 2012-08-17 NOTE — Patient Instructions (Signed)
Decrease blood counts to once a month next 09/13/12 Visit with Nurse Practitioner on Oct 11, 2012 Happy Thanksgiving!!

## 2012-08-30 ENCOUNTER — Other Ambulatory Visit: Payer: Self-pay | Admitting: Oncology

## 2012-08-30 DIAGNOSIS — C50919 Malignant neoplasm of unspecified site of unspecified female breast: Secondary | ICD-10-CM

## 2012-08-30 DIAGNOSIS — C9192 Lymphoid leukemia, unspecified, in relapse: Secondary | ICD-10-CM

## 2012-08-31 ENCOUNTER — Other Ambulatory Visit: Payer: Self-pay | Admitting: Oncology

## 2012-09-14 ENCOUNTER — Ambulatory Visit (HOSPITAL_BASED_OUTPATIENT_CLINIC_OR_DEPARTMENT_OTHER): Payer: Medicare Other

## 2012-09-14 ENCOUNTER — Other Ambulatory Visit (HOSPITAL_BASED_OUTPATIENT_CLINIC_OR_DEPARTMENT_OTHER): Payer: Medicare Other | Admitting: Lab

## 2012-09-14 VITALS — BP 191/54 | HR 66 | Temp 98.0°F

## 2012-09-14 DIAGNOSIS — C9110 Chronic lymphocytic leukemia of B-cell type not having achieved remission: Secondary | ICD-10-CM

## 2012-09-14 DIAGNOSIS — C9112 Chronic lymphocytic leukemia of B-cell type in relapse: Secondary | ICD-10-CM

## 2012-09-14 LAB — CBC WITH DIFFERENTIAL/PLATELET
BASO%: 0.3 % (ref 0.0–2.0)
Basophils Absolute: 0 10*3/uL (ref 0.0–0.1)
HCT: 31.6 % — ABNORMAL LOW (ref 34.8–46.6)
HGB: 11.1 g/dL — ABNORMAL LOW (ref 11.6–15.9)
MCHC: 35.2 g/dL (ref 31.5–36.0)
MONO#: 0.5 10*3/uL (ref 0.1–0.9)
NEUT#: 1.1 10*3/uL — ABNORMAL LOW (ref 1.5–6.5)
NEUT%: 18.3 % — ABNORMAL LOW (ref 38.4–76.8)
WBC: 5.9 10*3/uL (ref 3.9–10.3)
lymph#: 4.2 10*3/uL — ABNORMAL HIGH (ref 0.9–3.3)

## 2012-09-14 LAB — COMPREHENSIVE METABOLIC PANEL (CC13)
ALT: 21 U/L (ref 0–55)
Albumin: 4.1 g/dL (ref 3.5–5.0)
CO2: 24 mEq/L (ref 22–29)
Calcium: 9.6 mg/dL (ref 8.4–10.4)
Chloride: 109 mEq/L — ABNORMAL HIGH (ref 98–107)
Glucose: 103 mg/dl — ABNORMAL HIGH (ref 70–99)
Sodium: 142 mEq/L (ref 136–145)
Total Bilirubin: 0.66 mg/dL (ref 0.20–1.20)
Total Protein: 6.3 g/dL — ABNORMAL LOW (ref 6.4–8.3)

## 2012-09-14 LAB — LACTATE DEHYDROGENASE (CC13): LDH: 222 U/L (ref 125–245)

## 2012-09-14 MED ORDER — SODIUM CHLORIDE 0.9 % IJ SOLN
10.0000 mL | INTRAMUSCULAR | Status: DC | PRN
  Administered 2012-09-14: 10 mL via INTRAVENOUS
  Filled 2012-09-14: qty 10

## 2012-09-14 MED ORDER — HEPARIN SOD (PORK) LOCK FLUSH 100 UNIT/ML IV SOLN
500.0000 [IU] | Freq: Once | INTRAVENOUS | Status: AC
  Administered 2012-09-14: 500 [IU] via INTRAVENOUS
  Filled 2012-09-14: qty 5

## 2012-09-14 NOTE — Patient Instructions (Signed)
Call MD for any questions

## 2012-09-20 ENCOUNTER — Telehealth: Payer: Self-pay | Admitting: *Deleted

## 2012-09-20 ENCOUNTER — Other Ambulatory Visit: Payer: Self-pay | Admitting: Oncology

## 2012-09-20 DIAGNOSIS — C9192 Lymphoid leukemia, unspecified, in relapse: Secondary | ICD-10-CM

## 2012-09-20 NOTE — Telephone Encounter (Signed)
Reviewed note with Lonna Cobb NP.  She will discuss with Dr. Cyndie Chime when he returns.  Called patient and spoke with dtr.  Let her know that it will be next week before Dr. Cyndie Chime returns and we will talk with her then.

## 2012-09-20 NOTE — Telephone Encounter (Signed)
Message copied by Orbie Hurst on Mon Sep 20, 2012  5:04 PM ------      Message from: Chip Boer      Created: Tue Sep 14, 2012 10:42 AM      Regarding: Abdominal pain      Contact: [phone]       Spoke with pt in flush room today.  She complains of general abdominal discomfort.  She specifically states it is not pain, but more of a discomfort.  Pain radiates around to her lower back.  She takes Tylenol which does help.  She does have chronic constipation, but states that current discomfort is distinctly different.  Discomfort has been near constant for about a month.  Has follow up appointment with Misty Stanley in January.  Port accessed and labs drawn today.  Would like a call to see if there is anything specific to be doing for the discomfort.

## 2012-09-27 ENCOUNTER — Other Ambulatory Visit: Payer: Self-pay | Admitting: Oncology

## 2012-10-04 ENCOUNTER — Other Ambulatory Visit: Payer: Self-pay | Admitting: Oncology

## 2012-10-12 ENCOUNTER — Ambulatory Visit (HOSPITAL_COMMUNITY)
Admission: RE | Admit: 2012-10-12 | Discharge: 2012-10-12 | Disposition: A | Discharge status: Home / Self Care | Payer: Medicare Other | Source: Ambulatory Visit | Attending: Nurse Practitioner | Admitting: Nurse Practitioner

## 2012-10-12 ENCOUNTER — Telehealth: Payer: Self-pay | Admitting: Oncology

## 2012-10-12 ENCOUNTER — Ambulatory Visit (HOSPITAL_BASED_OUTPATIENT_CLINIC_OR_DEPARTMENT_OTHER): Payer: Medicare Other | Admitting: Nurse Practitioner

## 2012-10-12 ENCOUNTER — Other Ambulatory Visit (HOSPITAL_BASED_OUTPATIENT_CLINIC_OR_DEPARTMENT_OTHER): Payer: Medicare Other | Admitting: Lab

## 2012-10-12 ENCOUNTER — Telehealth: Payer: Self-pay | Admitting: Nurse Practitioner

## 2012-10-12 ENCOUNTER — Ambulatory Visit: Payer: Medicare Other

## 2012-10-12 VITALS — BP 182/58 | HR 60 | Temp 98.2°F

## 2012-10-12 VITALS — BP 200/71 | HR 67 | Temp 98.3°F | Resp 20 | Ht 63.5 in | Wt 267.3 lb

## 2012-10-12 DIAGNOSIS — C8590 Non-Hodgkin lymphoma, unspecified, unspecified site: Secondary | ICD-10-CM

## 2012-10-12 DIAGNOSIS — C9110 Chronic lymphocytic leukemia of B-cell type not having achieved remission: Secondary | ICD-10-CM

## 2012-10-12 DIAGNOSIS — M545 Low back pain, unspecified: Secondary | ICD-10-CM | POA: Insufficient documentation

## 2012-10-12 DIAGNOSIS — Z1231 Encounter for screening mammogram for malignant neoplasm of breast: Secondary | ICD-10-CM

## 2012-10-12 DIAGNOSIS — M47814 Spondylosis without myelopathy or radiculopathy, thoracic region: Secondary | ICD-10-CM | POA: Insufficient documentation

## 2012-10-12 DIAGNOSIS — IMO0002 Reserved for concepts with insufficient information to code with codable children: Secondary | ICD-10-CM | POA: Insufficient documentation

## 2012-10-12 DIAGNOSIS — M5137 Other intervertebral disc degeneration, lumbosacral region: Secondary | ICD-10-CM | POA: Insufficient documentation

## 2012-10-12 DIAGNOSIS — C50919 Malignant neoplasm of unspecified site of unspecified female breast: Secondary | ICD-10-CM

## 2012-10-12 DIAGNOSIS — C8599 Non-Hodgkin lymphoma, unspecified, extranodal and solid organ sites: Secondary | ICD-10-CM

## 2012-10-12 DIAGNOSIS — M47817 Spondylosis without myelopathy or radiculopathy, lumbosacral region: Secondary | ICD-10-CM | POA: Insufficient documentation

## 2012-10-12 DIAGNOSIS — M51379 Other intervertebral disc degeneration, lumbosacral region without mention of lumbar back pain or lower extremity pain: Secondary | ICD-10-CM | POA: Insufficient documentation

## 2012-10-12 DIAGNOSIS — Z170 Estrogen receptor positive status [ER+]: Secondary | ICD-10-CM

## 2012-10-12 DIAGNOSIS — C9192 Lymphoid leukemia, unspecified, in relapse: Secondary | ICD-10-CM

## 2012-10-12 LAB — COMPREHENSIVE METABOLIC PANEL (CC13)
ALT: 18 U/L (ref 0–55)
AST: 21 U/L (ref 5–34)
Alkaline Phosphatase: 92 U/L (ref 40–150)
CO2: 24 mEq/L (ref 22–29)
Creatinine: 1.2 mg/dL — ABNORMAL HIGH (ref 0.6–1.1)
Sodium: 141 mEq/L (ref 136–145)
Total Bilirubin: 0.49 mg/dL (ref 0.20–1.20)
Total Protein: 6.5 g/dL (ref 6.4–8.3)

## 2012-10-12 LAB — CBC WITH DIFFERENTIAL/PLATELET
BASO%: 0.3 % (ref 0.0–2.0)
Basophils Absolute: 0 10*3/uL (ref 0.0–0.1)
EOS%: 1.8 % (ref 0.0–7.0)
HGB: 10.9 g/dL — ABNORMAL LOW (ref 11.6–15.9)
MCH: 37.8 pg — ABNORMAL HIGH (ref 25.1–34.0)
MCHC: 34.4 g/dL (ref 31.5–36.0)
MCV: 110.1 fL — ABNORMAL HIGH (ref 79.5–101.0)
MONO%: 7.7 % (ref 0.0–14.0)
RBC: 2.88 10*6/uL — ABNORMAL LOW (ref 3.70–5.45)
RDW: 14 % (ref 11.2–14.5)

## 2012-10-12 LAB — LACTATE DEHYDROGENASE (CC13): LDH: 209 U/L (ref 125–245)

## 2012-10-12 MED ORDER — SODIUM CHLORIDE 0.9 % IJ SOLN
10.0000 mL | INTRAMUSCULAR | Status: DC | PRN
  Filled 2012-10-12: qty 10

## 2012-10-12 MED ORDER — HEPARIN SOD (PORK) LOCK FLUSH 100 UNIT/ML IV SOLN
500.0000 [IU] | Freq: Once | INTRAVENOUS | Status: AC
  Administered 2012-10-12: 500 [IU] via INTRAVENOUS
  Filled 2012-10-12: qty 5

## 2012-10-12 MED ORDER — SODIUM CHLORIDE 0.9 % IJ SOLN
10.0000 mL | INTRAMUSCULAR | Status: DC | PRN
  Administered 2012-10-12: 10 mL via INTRAVENOUS
  Filled 2012-10-12: qty 10

## 2012-10-12 MED ORDER — HEPARIN SOD (PORK) LOCK FLUSH 100 UNIT/ML IV SOLN
500.0000 [IU] | Freq: Once | INTRAVENOUS | Status: DC
  Filled 2012-10-12: qty 5

## 2012-10-12 NOTE — Progress Notes (Signed)
OFFICE PROGRESS NOTE  Interval history:  [name] is a 79 year old woman with well-differentiated lymphocytic lymphoma/CLL. She completed the course of Ofatumumab on 08/17/2012. There was improvement in the platelet count and stabilization of the hemoglobin and white count.  She also has a history of invasive ER positive breast cancer status post left mastectomy currently on tamoxifen.  She is seen today for scheduled followup. She denies fever. She continues to have sweats. Appetite varies. Her weight is stable. Energy level is poor. Over the past several months she has been experiencing low back pain. The pain occasionally radiates to the sides. The pain is relieved with Tylenol. The pain is unchanged over approximately 2 months. She denies leg numbness. She has stable chronic mild weakness of the right leg which she relates to a previous stroke. No bowel or bladder dysfunction. She is no longer experiencing dizziness. She takes a daily meclizine tablet. No nausea or vomiting. Stable dyspnea on exertion.   Objective: Blood pressure 200/71, pulse 67, temperature 98.3 F (36.8 C), temperature source Oral, resp. rate 20, height 5' 3.5" (1.613 m), weight 267 lb 4.8 oz (121.246 kg).  Oropharynx is without thrush or ulceration. No palpable cervical or supraclavicular lymph nodes. Question small bilateral axillary lymph nodes versus prominent fat pads. Lungs are clear. No wheezes or rales. Regular cardiac rhythm. Port-A-Cath site is without erythema. Abdomen is soft and nontender. No obvious organomegaly. 1-2+ lower leg edema bilaterally. Motor strength 5 over 5. Knee DTRs 1+, symmetric.  Lab Results: Lab Results  Component Value Date   WBC 5.9 09/14/2012   HGB 11.1* 09/14/2012   HCT 31.6* 09/14/2012   MCV 114.2* 09/14/2012   PLT 95* 09/14/2012    Chemistry:    Chemistry      Component Value Date/Time   NA 142 09/14/2012 1006   NA 139 05/03/2012 1106   K 4.0 09/14/2012 1006   K 3.9 05/03/2012  1106   CL 109* 09/14/2012 1006   CL 104 05/03/2012 1106   CO2 24 09/14/2012 1006   CO2 26 05/03/2012 1106   BUN 17.0 09/14/2012 1006   BUN 19 05/03/2012 1106   CREATININE 1.2* 09/14/2012 1006   CREATININE 1.02 05/03/2012 1106      Component Value Date/Time   CALCIUM 9.6 09/14/2012 1006   CALCIUM 9.4 05/03/2012 1106   ALKPHOS 75 09/14/2012 1006   ALKPHOS 77 05/03/2012 1106   AST 21 09/14/2012 1006   AST 24 05/03/2012 1106   ALT 21 09/14/2012 1006   ALT 26 05/03/2012 1106   BILITOT 0.66 09/14/2012 1006   BILITOT 0.5 05/03/2012 1106       Studies/Results: No results found.  Medications: I have reviewed the patient's current medications.  Assessment/Plan:  1. Well-differentiated lymphocytic lymphoma presenting with rapidly progressive pancytopenia in November 2009. She was found to have left axillary lymphadenopathy and a mass in the left breast. Biopsy of the breast showed DCIS. However, the axillary node showed a lymphoma with immunophenotype consistent with WDLL versus CLL. Disease became rapidly progressive and she became transfusion dependent for red cells and platelets. She was treated with a series of different chemotherapy and immunotherapy agents, initially with fludarabine, Cytoxan and Rituxan, then Campath and ultimately she achieved a partial response to ofatumumab [name]) initially given between April and June of 2010 and then monthly through October 2010. In May 2013 the blood counts began to deteriorate with a fall in the platelet count down to 59,000 and rise in the percent lymphocytes on  the white count differential. Treatment with Ofatumumab was resumed. She completed the eighth weekly treatment on 04/20/2012. She began the monthly treatment schedule on 05/25/2012. She completed the course of ofatumumab on 08/17/2012. She has had a partial response. 2. Initial noninvasive cancers left breast with subsequent invasive cancer ER positive status post left mastectomy. Initial biopsy at time  of diagnosis of her lymphoma in September 2009. Definitive treatment delayed until her blood condition was under control. Subsequent left mastectomy with sentinel lymph node procedure 10/09/2010. Pathology with multifocal invasive cancer up to 2.6 cm with a separate area of DCIS 0.7 cm. Lymph nodes negative for breast cancer. ER/PR strongly positive, HER-2 negative. She was started on hormonal therapy with Aromasin, but was unable to tolerate and was switched to tamoxifen. She continues tamoxifen. 3. Essential hypertension. 4. Cerebrovascular disease status post stroke in September 2010. 5. Chronic dermatitis. 6. Intermittent allergic reactions to Ofatumumab. Pepcid added to the premedication regimen beginning 03/30/2012. She had multiple subsequent doses without any reaction. 7. Low back pain stable over several months. Pain relieved with Tylenol. We will obtain plain films of the lumbosacral spine today.  Disposition-[name] appears stable. We will followup on the labs from today. She will return in one month for labs and in 2 months for an office visit. She will contact the office in the interim with any problems.  Plan reviewed with Dr. Cyndie Chime.  Lonna Cobb ANP/GNP-BC

## 2012-10-12 NOTE — Patient Instructions (Signed)
Call MD with any questions 

## 2012-10-12 NOTE — Telephone Encounter (Signed)
See dictated office note

## 2012-10-12 NOTE — Telephone Encounter (Signed)
Gave pt appt for lab and MD on April per pt request on a Friday , lab q months

## 2012-10-15 ENCOUNTER — Telehealth: Payer: Self-pay | Admitting: *Deleted

## 2012-10-15 NOTE — Telephone Encounter (Signed)
Message copied by Sabino Snipes on Fri Oct 15, 2012  6:11 PM ------      Message from: Levert Feinstein      Created: Fri Oct 15, 2012 10:36 AM       Call pt: spine X-rays normal except for arthritis

## 2012-10-15 NOTE — Telephone Encounter (Signed)
Pt notified of spine xray report per Dr Cyndie Chime & pt also asked about lab results & informed that these were stable.

## 2012-11-02 ENCOUNTER — Telehealth: Payer: Self-pay | Admitting: Oncology

## 2012-11-02 NOTE — Telephone Encounter (Signed)
Pt called and r/s appt for lab to 11/15/12 from 2/14

## 2012-11-12 ENCOUNTER — Other Ambulatory Visit: Payer: Medicare Other

## 2012-11-15 ENCOUNTER — Other Ambulatory Visit (HOSPITAL_BASED_OUTPATIENT_CLINIC_OR_DEPARTMENT_OTHER): Payer: Medicare Other | Admitting: Lab

## 2012-11-15 ENCOUNTER — Ambulatory Visit: Payer: Medicare Other

## 2012-11-15 VITALS — BP 196/71 | HR 61 | Temp 97.7°F

## 2012-11-15 DIAGNOSIS — C8590 Non-Hodgkin lymphoma, unspecified, unspecified site: Secondary | ICD-10-CM

## 2012-11-15 DIAGNOSIS — C50919 Malignant neoplasm of unspecified site of unspecified female breast: Secondary | ICD-10-CM

## 2012-11-15 DIAGNOSIS — C9112 Chronic lymphocytic leukemia of B-cell type in relapse: Secondary | ICD-10-CM

## 2012-11-15 DIAGNOSIS — C9192 Lymphoid leukemia, unspecified, in relapse: Secondary | ICD-10-CM

## 2012-11-15 DIAGNOSIS — C9110 Chronic lymphocytic leukemia of B-cell type not having achieved remission: Secondary | ICD-10-CM

## 2012-11-15 LAB — CBC WITH DIFFERENTIAL/PLATELET
Basophils Absolute: 0 10*3/uL (ref 0.0–0.1)
EOS%: 1.9 % (ref 0.0–7.0)
Eosinophils Absolute: 0.1 10*3/uL (ref 0.0–0.5)
HGB: 11.3 g/dL — ABNORMAL LOW (ref 11.6–15.9)
LYMPH%: 68.2 % — ABNORMAL HIGH (ref 14.0–49.7)
MCH: 40.6 pg — ABNORMAL HIGH (ref 25.1–34.0)
MCV: 111.1 fL — ABNORMAL HIGH (ref 79.5–101.0)
MONO%: 8.8 % (ref 0.0–14.0)
NEUT#: 1.6 10*3/uL (ref 1.5–6.5)
Platelets: 99 10*3/uL — ABNORMAL LOW (ref 145–400)
RDW: 14.3 % (ref 11.2–14.5)

## 2012-11-15 LAB — COMPREHENSIVE METABOLIC PANEL (CC13)
Alkaline Phosphatase: 84 U/L (ref 40–150)
BUN: 21.8 mg/dL (ref 7.0–26.0)
CO2: 24 mEq/L (ref 22–29)
Creatinine: 1.2 mg/dL — ABNORMAL HIGH (ref 0.6–1.1)
Glucose: 109 mg/dl — ABNORMAL HIGH (ref 70–99)
Total Bilirubin: 0.59 mg/dL (ref 0.20–1.20)
Total Protein: 6.3 g/dL — ABNORMAL LOW (ref 6.4–8.3)

## 2012-11-15 LAB — TECHNOLOGIST REVIEW

## 2012-11-15 MED ORDER — SODIUM CHLORIDE 0.9 % IJ SOLN
10.0000 mL | INTRAMUSCULAR | Status: DC | PRN
  Administered 2012-11-15: 10 mL via INTRAVENOUS
  Filled 2012-11-15: qty 10

## 2012-11-15 MED ORDER — HEPARIN SOD (PORK) LOCK FLUSH 100 UNIT/ML IV SOLN
500.0000 [IU] | Freq: Once | INTRAVENOUS | Status: AC
  Administered 2012-11-15: 500 [IU] via INTRAVENOUS
  Filled 2012-11-15: qty 5

## 2012-12-06 ENCOUNTER — Other Ambulatory Visit: Payer: Self-pay | Admitting: Oncology

## 2012-12-10 ENCOUNTER — Encounter (INDEPENDENT_AMBULATORY_CARE_PROVIDER_SITE_OTHER): Payer: Self-pay | Admitting: General Surgery

## 2012-12-10 ENCOUNTER — Ambulatory Visit: Payer: Medicare Other

## 2012-12-10 ENCOUNTER — Ambulatory Visit
Admission: RE | Admit: 2012-12-10 | Discharge: 2012-12-10 | Disposition: A | Discharge status: Home / Self Care | Payer: Medicare Other | Source: Ambulatory Visit | Attending: Nurse Practitioner | Admitting: Nurse Practitioner

## 2012-12-10 ENCOUNTER — Ambulatory Visit (INDEPENDENT_AMBULATORY_CARE_PROVIDER_SITE_OTHER): Payer: Medicare Other | Admitting: General Surgery

## 2012-12-10 ENCOUNTER — Other Ambulatory Visit (HOSPITAL_BASED_OUTPATIENT_CLINIC_OR_DEPARTMENT_OTHER): Payer: Medicare Other

## 2012-12-10 ENCOUNTER — Ambulatory Visit (HOSPITAL_BASED_OUTPATIENT_CLINIC_OR_DEPARTMENT_OTHER): Payer: Medicare Other

## 2012-12-10 VITALS — BP 170/72 | HR 69 | Temp 98.7°F | Ht 63.5 in | Wt 270.2 lb

## 2012-12-10 VITALS — BP 197/50 | Temp 98.1°F | Resp 20

## 2012-12-10 DIAGNOSIS — C9110 Chronic lymphocytic leukemia of B-cell type not having achieved remission: Secondary | ICD-10-CM

## 2012-12-10 DIAGNOSIS — C50919 Malignant neoplasm of unspecified site of unspecified female breast: Secondary | ICD-10-CM

## 2012-12-10 DIAGNOSIS — C50912 Malignant neoplasm of unspecified site of left female breast: Secondary | ICD-10-CM

## 2012-12-10 DIAGNOSIS — Z1231 Encounter for screening mammogram for malignant neoplasm of breast: Secondary | ICD-10-CM

## 2012-12-10 DIAGNOSIS — C8590 Non-Hodgkin lymphoma, unspecified, unspecified site: Secondary | ICD-10-CM

## 2012-12-10 LAB — CBC WITH DIFFERENTIAL/PLATELET
BASO%: 0.4 % (ref 0.0–2.0)
Eosinophils Absolute: 0.1 10*3/uL (ref 0.0–0.5)
HCT: 32.1 % — ABNORMAL LOW (ref 34.8–46.6)
HGB: 11.2 g/dL — ABNORMAL LOW (ref 11.6–15.9)
LYMPH%: 67.4 % — ABNORMAL HIGH (ref 14.0–49.7)
MCHC: 34.9 g/dL (ref 31.5–36.0)
MONO#: 0.8 10*3/uL (ref 0.1–0.9)
NEUT#: 1.6 10*3/uL (ref 1.5–6.5)
NEUT%: 20.4 % — ABNORMAL LOW (ref 38.4–76.8)
Platelets: 99 10*3/uL — ABNORMAL LOW (ref 145–400)
WBC: 7.7 10*3/uL (ref 3.9–10.3)
lymph#: 5.2 10*3/uL — ABNORMAL HIGH (ref 0.9–3.3)

## 2012-12-10 LAB — COMPREHENSIVE METABOLIC PANEL (CC13)
ALT: 22 U/L (ref 0–55)
Alkaline Phosphatase: 88 U/L (ref 40–150)
CO2: 23 mEq/L (ref 22–29)
Sodium: 140 mEq/L (ref 136–145)
Total Bilirubin: 0.46 mg/dL (ref 0.20–1.20)
Total Protein: 6.6 g/dL (ref 6.4–8.3)

## 2012-12-10 LAB — LACTATE DEHYDROGENASE (CC13): LDH: 215 U/L (ref 125–245)

## 2012-12-10 MED ORDER — SODIUM CHLORIDE 0.9 % IJ SOLN
10.0000 mL | INTRAMUSCULAR | Status: DC | PRN
  Administered 2012-12-10: 10 mL via INTRAVENOUS
  Filled 2012-12-10: qty 10

## 2012-12-10 MED ORDER — HEPARIN SOD (PORK) LOCK FLUSH 100 UNIT/ML IV SOLN
500.0000 [IU] | Freq: Once | INTRAVENOUS | Status: AC
  Administered 2012-12-10: 500 [IU] via INTRAVENOUS
  Filled 2012-12-10: qty 5

## 2012-12-10 NOTE — Progress Notes (Signed)
Chief complaint: Followup breast cancer  History: Patient returns for long-term followup at 2 years following left total mastectomy with negative sentinel lymph node biopsy for T2 N 0 invasive cancer of the left breast. She is followed by Dr. Kennedy Bucker for tonight for CLL. Her last treatment was in November and she reports that this is stable. She has not noted any left arm swelling or chest wall masses or skin changes. She is concerned she might feel some thickening in the right breast. Her mammogram is due later today.  Exam: BP 170/72  Pulse 69  Temp(Src) 98.7 F (37.1 C) (Temporal)  Ht 5' 3.5" (1.613 m)  Wt 270 lb 3.2 oz (122.562 kg)  BMI 47.11 kg/m2  SpO2 98% General: Overweight Afro-American female in no distress Skin: No rash or infection HEENT: Port-A-Cath in the right neck. No masses. Lymph nodes: No cervical, subclavicular or axillary nodes palpable bilaterally Lungs: Clear equal breath sounds bilaterally. Chest wall: There is a possibly 5 x 3 cm soft tissue mass on her posterior medial right shoulder consistent with lipoma. Breasts: Status post left total mastectomy. Well-healed wound. The subcutaneous masses or skin lesions. Axilla is negative. Right breast is negative to exam without mass or skin change or nipple discharge.  Assessment and plan: Doing well 2 years following left total mastectomy observation only. Mammogram is today and will follow up these results but her exam is negative. Return in one year or sooner as needed.

## 2012-12-10 NOTE — Patient Instructions (Signed)
Call MD for problems or concerns 

## 2012-12-13 ENCOUNTER — Other Ambulatory Visit: Payer: Self-pay | Admitting: Oncology

## 2012-12-13 DIAGNOSIS — N63 Unspecified lump in unspecified breast: Secondary | ICD-10-CM

## 2012-12-13 DIAGNOSIS — N644 Mastodynia: Secondary | ICD-10-CM

## 2012-12-16 ENCOUNTER — Telehealth: Payer: Self-pay | Admitting: *Deleted

## 2012-12-16 NOTE — Telephone Encounter (Signed)
Message copied by Orbie Hurst on Thu Dec 16, 2012  4:16 PM ------      Message from: Levert Feinstein      Created: Tue Dec 14, 2012  8:21 AM       Call pt lab stable ------

## 2012-12-16 NOTE — Telephone Encounter (Signed)
Spoke with patient and let her know that labs are stable.  She appreciated the call.

## 2012-12-16 NOTE — Telephone Encounter (Signed)
Left message to call back to our office for results

## 2012-12-17 ENCOUNTER — Telehealth: Payer: Self-pay | Admitting: *Deleted

## 2012-12-17 NOTE — Telephone Encounter (Signed)
Message copied by Sabino Snipes on Fri Dec 17, 2012  6:24 PM ------      Message from: Levert Feinstein      Created: Tue Dec 14, 2012  8:21 AM       Call pt lab stable ------

## 2012-12-17 NOTE — Telephone Encounter (Signed)
Pt notifed of stable lab results per Dr Cyndie Chime & she was pleased.

## 2012-12-29 ENCOUNTER — Other Ambulatory Visit: Payer: Self-pay | Admitting: Oncology

## 2012-12-30 ENCOUNTER — Ambulatory Visit
Admission: RE | Admit: 2012-12-30 | Discharge: 2012-12-30 | Disposition: A | Discharge status: Home / Self Care | Payer: Medicare Other | Source: Ambulatory Visit | Attending: Oncology | Admitting: Oncology

## 2012-12-30 DIAGNOSIS — N63 Unspecified lump in unspecified breast: Secondary | ICD-10-CM

## 2012-12-30 DIAGNOSIS — N644 Mastodynia: Secondary | ICD-10-CM

## 2012-12-31 ENCOUNTER — Telehealth: Payer: Self-pay | Admitting: Oncology

## 2012-12-31 ENCOUNTER — Ambulatory Visit (HOSPITAL_BASED_OUTPATIENT_CLINIC_OR_DEPARTMENT_OTHER): Payer: Medicare Other | Admitting: Oncology

## 2012-12-31 ENCOUNTER — Ambulatory Visit: Payer: Medicare Other

## 2012-12-31 ENCOUNTER — Other Ambulatory Visit (HOSPITAL_BASED_OUTPATIENT_CLINIC_OR_DEPARTMENT_OTHER): Payer: Medicare Other | Admitting: Lab

## 2012-12-31 VITALS — BP 183/75 | HR 61 | Temp 97.3°F | Resp 22 | Ht 63.5 in | Wt 270.3 lb

## 2012-12-31 VITALS — BP 178/66 | HR 63 | Temp 97.1°F | Resp 20

## 2012-12-31 DIAGNOSIS — C9192 Lymphoid leukemia, unspecified, in relapse: Secondary | ICD-10-CM

## 2012-12-31 DIAGNOSIS — C9112 Chronic lymphocytic leukemia of B-cell type in relapse: Secondary | ICD-10-CM

## 2012-12-31 DIAGNOSIS — I10 Essential (primary) hypertension: Secondary | ICD-10-CM

## 2012-12-31 DIAGNOSIS — C50919 Malignant neoplasm of unspecified site of unspecified female breast: Secondary | ICD-10-CM

## 2012-12-31 DIAGNOSIS — C9110 Chronic lymphocytic leukemia of B-cell type not having achieved remission: Secondary | ICD-10-CM

## 2012-12-31 DIAGNOSIS — C50912 Malignant neoplasm of unspecified site of left female breast: Secondary | ICD-10-CM

## 2012-12-31 DIAGNOSIS — C50419 Malignant neoplasm of upper-outer quadrant of unspecified female breast: Secondary | ICD-10-CM

## 2012-12-31 DIAGNOSIS — C8590 Non-Hodgkin lymphoma, unspecified, unspecified site: Secondary | ICD-10-CM

## 2012-12-31 LAB — CBC WITH DIFFERENTIAL/PLATELET
BASO%: 0.3 % (ref 0.0–2.0)
LYMPH%: 69.6 % — ABNORMAL HIGH (ref 14.0–49.7)
MCHC: 35.1 g/dL (ref 31.5–36.0)
MONO#: 0.7 10*3/uL (ref 0.1–0.9)
MONO%: 9.4 % (ref 0.0–14.0)
Platelets: 92 10*3/uL — ABNORMAL LOW (ref 145–400)
RBC: 2.89 10*6/uL — ABNORMAL LOW (ref 3.70–5.45)
WBC: 7.2 10*3/uL (ref 3.9–10.3)

## 2012-12-31 LAB — TECHNOLOGIST REVIEW

## 2012-12-31 LAB — COMPREHENSIVE METABOLIC PANEL (CC13)
ALT: 23 U/L (ref 0–55)
AST: 21 U/L (ref 5–34)
Alkaline Phosphatase: 87 U/L (ref 40–150)
CO2: 24 mEq/L (ref 22–29)
Sodium: 141 mEq/L (ref 136–145)
Total Bilirubin: 0.5 mg/dL (ref 0.20–1.20)
Total Protein: 6.5 g/dL (ref 6.4–8.3)

## 2012-12-31 MED ORDER — HEPARIN SOD (PORK) LOCK FLUSH 100 UNIT/ML IV SOLN
500.0000 [IU] | Freq: Once | INTRAVENOUS | Status: AC
  Administered 2012-12-31: 500 [IU] via INTRAVENOUS
  Filled 2012-12-31: qty 5

## 2012-12-31 MED ORDER — SODIUM CHLORIDE 0.9 % IJ SOLN
10.0000 mL | INTRAMUSCULAR | Status: DC | PRN
  Administered 2012-12-31: 10 mL via INTRAVENOUS
  Filled 2012-12-31: qty 10

## 2012-12-31 NOTE — Telephone Encounter (Signed)
gv and printed appt schedule for pt for may and Aug

## 2012-12-31 NOTE — Patient Instructions (Signed)
Change lab to every 2 months next June 6 Return visit 4 months to see Misty Stanley 8/1

## 2013-01-02 NOTE — Progress Notes (Signed)
Hematology and Oncology Follow Up Visit  [name] [id number] 18-May-1934 79 y.o. 01/02/2013 11:32 AM   Principle Diagnosis: Encounter Diagnoses  Name Primary?  . CLL (chronic lymphoid leukemia) in relapse Yes  . Breast cancer, left      Interim History:   Followup visit for this 79 year old woman with well differentiated lymphocytic lymphoma/chronic lymphocytic leukemia. She is doing amazingly well since we finally found something to at least partially control her blood disorder in the form of a new anti-CD 20 antibody - Arzerra initially started in April 2010 after she failed treatment with FCR and Campath. She achieved a excellent partial response. The disease became active again. She was put back on single agent Arzerra 02/24/2012 and again got another nice partial response. She was given reinduction and maintenance and had her most recent dose on 08/17/2012. She is now on observation alone.  She has had no interim medical problems. She got through this winter without any serious infections. Her atypical neurologic symptoms have resolved at this time.  Medications: reviewed  Allergies:  Allergies  Allergen Reactions  . Latex   . Penicillins     Review of Systems: Constitutional:  No constitutional symptoms  Respiratory: No cough or dyspnea Cardiovascular:  No chest pain or palpitations Gastrointestinal: No change in bowel habit Genito-Urinary: No urinary tract symptoms Musculoskeletal: No muscle bone or joint pain Neurologic: No new neurologic symptoms Skin: Chronic dermatitis better controlled at present Remaining ROS negative.  Physical Exam: Blood pressure 183/75, pulse 61, temperature 97.3 F (36.3 C), temperature source Oral, resp. rate 22, height 5' 3.5" (1.613 m), weight 270 lb 4.8 oz (122.607 kg). Wt Readings from Last 3 Encounters:  12/31/12 270 lb 4.8 oz (122.607 kg)  12/10/12 270 lb 3.2 oz (122.562 kg)  10/12/12 267 lb 4.8 oz (121.246 kg)     General  appearance: Pleasant, overweight, African American woman HENNT: Pharynx no erythema or exudate Lymph nodes: No cervical, supraclavicular, axillary, or inguinal adenopathy Breasts: Left mastectomy, no right breast masses Lungs: Clear to auscultation resonant to percussion Heart: Regular rhythm no murmur Abdomen: Soft, obese, nontender, no gross mass or organomegaly Extremities: No edema, no calf tenderness Vascular: No cyanosis Neurologic: Motor strength 5 over 5, reflexes 1+ symmetric Skin: Chronic hyperpigmented nodular dermatitis stable  Lab Results: Lab Results  Component Value Date   WBC 7.2 12/31/2012   HGB 11.3* 12/31/2012   HCT 32.3* 12/31/2012   MCV 111.7* 12/31/2012   PLT 92* 12/31/2012  White count differential: 70% lymphocytes, 19% neutrophils stable compare with prior values   Chemistry      Component Value Date/Time   NA 141 12/31/2012 1343   NA 139 05/03/2012 1106   K 4.1 12/31/2012 1343   K 3.9 05/03/2012 1106   CL 107 12/31/2012 1343   CL 104 05/03/2012 1106   CO2 24 12/31/2012 1343   CO2 26 05/03/2012 1106   BUN 22.7 12/31/2012 1343   BUN 19 05/03/2012 1106   CREATININE 1.2* 12/31/2012 1343   CREATININE 1.02 05/03/2012 1106      Component Value Date/Time   CALCIUM 9.6 12/31/2012 1343   CALCIUM 9.4 05/03/2012 1106   ALKPHOS 87 12/31/2012 1343   ALKPHOS 77 05/03/2012 1106   AST 21 12/31/2012 1343   AST 24 05/03/2012 1106   ALT 23 12/31/2012 1343   ALT 26 05/03/2012 1106   BILITOT 0.50 12/31/2012 1343   BILITOT 0.5 05/03/2012 1106       Radiological Studies: US Breast  Right  12/30/2012  *RADIOLOGY REPORT*  Clinical Data:  The patient has focal pain and a lump in the 6 o'clock position of the right breast.  DIGITAL DIAGNOSTIC RIGHT MAMMOGRAM WITH CAD AND RIGHT BREAST ULTRASOUND:  Comparison:  11/17/2011, 06/26/2010, 06/05/2009  Findings:  ACR Breast Density Category 1: The breast tissue is almost entirely fatty.  There is no suspicious dominant mass, architectural distortion or calcification to suggest  malignancy.  Mammographic images were processed with CAD.  On physical exam, no mass is palpated in the inferior half of the right breast.  The patient is focally tender at 6 o'clock 3 cm from the right nipple.  Ultrasound is performed, showing fatty tissue in the area of concern at 6 o'clock.  IMPRESSION: No mammographic or sonographic evidence of malignancy.  RECOMMENDATION: Yearly screening mammography is suggested.  I have discussed the findings and recommendations with the patient. Results were also provided in writing at the conclusion of the visit.  If applicable, a reminder letter will be sent to the patient regarding the next appointment.  BI-RADS CATEGORY 1:  Negative.   Original Report Authenticated By: Cain Saupe, M.D.    Mm Digital Diagnostic Unilat R  12/30/2012  *RADIOLOGY REPORT*  Clinical Data:  The patient has focal pain and a lump in the 6 o'clock position of the right breast.  DIGITAL DIAGNOSTIC RIGHT MAMMOGRAM WITH CAD AND RIGHT BREAST ULTRASOUND:  Comparison:  11/17/2011, 06/26/2010, 06/05/2009  Findings:  ACR Breast Density Category 1: The breast tissue is almost entirely fatty.  There is no suspicious dominant mass, architectural distortion or calcification to suggest malignancy.  Mammographic images were processed with CAD.  On physical exam, no mass is palpated in the inferior half of the right breast.  The patient is focally tender at 6 o'clock 3 cm from the right nipple.  Ultrasound is performed, showing fatty tissue in the area of concern at 6 o'clock.  IMPRESSION: No mammographic or sonographic evidence of malignancy.  RECOMMENDATION: Yearly screening mammography is suggested.  I have discussed the findings and recommendations with the patient. Results were also provided in writing at the conclusion of the visit.  If applicable, a reminder letter will be sent to the patient regarding the next appointment.  BI-RADS CATEGORY 1:  Negative.   Original Report Authenticated By:  Cain Saupe, M.D.     Impression and Plan: #1. Multiply relapsed chronic lymphocytic leukemia/well-differentiated lymphocytic lymphoma initial diagnosis November 2009. Refractory to initial treatment including fludarabine, Cytoxan, Rituxan and subsequently Campath anti-CD57 antibody. Excellent partial response to Arzerra given between April and June 2010 then monthly through October 2010. Fall in platelet count May 2013. Repeat course of Arzerra  between May and November 2013 with improvement in platelet count and stable hemoglobin and white count. Although white count differential has not normalized, it has remained stable with 15-20% neutrophils.  Plan:  I discussed with her and her daughter again today that we have some new agents active for relapsed CLL.  There is increasing data to support the use of Revlimid with or without Rituxan. (R square). We reviewed the use of Revlimid and potential side effects today.   A third generation anti-CD20 antibody was approved for relapsed CLL, Obinutuzumab is now approved..  Ibrutinib which works by interrupting signals through the B cell receptor is now approved for relapsed CLL .  There are 2 other drugs that are in phase 3 trials that look promising:  a PIK inhibiotor, Idelalisib, and anti PD-1 antibodies.  For now, since she shown nice stabilization with a repeat course of Arzerra, I will just monitor her blood counts on a monthly basis. She is reassured that we do have options if disease continues to progress. She understands that this is not a curable situation and that we will need to readdress treatment again in the future.   #2. Initial noninvasive cancer left breast with subsequent invasive cancer ER positive status post left mastectomy. She is back on tamoxifen at this time. She did not tolerate an aromatase inhibitor.   #3. Intermittent neurologic symptoms with history of a stroke in September 2010.  No acute change on recent MRI of the brain  but still possible that she had another TIA. Current symptoms suggest vertebrobasilar insufficiency.   #4. Intermittent pleuritic chest pain. Etiology unclear. Symptoms currently resolved.   #5. Essential hypertension. Borderline controlled on current medications.   #6. Chronic nodular dermatitis       CC:. Dr. Della Goo; Dr. Wilfrid Lund        Levert Feinstein, MD 4/6/201411:32 AM

## 2013-01-06 ENCOUNTER — Encounter: Payer: Self-pay | Admitting: Oncology

## 2013-01-10 ENCOUNTER — Other Ambulatory Visit: Payer: Self-pay | Admitting: Oncology

## 2013-02-01 ENCOUNTER — Ambulatory Visit (INDEPENDENT_AMBULATORY_CARE_PROVIDER_SITE_OTHER): Payer: Medicare Other

## 2013-02-01 ENCOUNTER — Encounter: Payer: Self-pay | Admitting: Internal Medicine

## 2013-02-01 ENCOUNTER — Ambulatory Visit (INDEPENDENT_AMBULATORY_CARE_PROVIDER_SITE_OTHER): Payer: Medicare Other | Admitting: Internal Medicine

## 2013-02-01 VITALS — BP 148/70 | HR 57 | Temp 97.9°F | Ht 63.5 in | Wt 273.0 lb

## 2013-02-01 DIAGNOSIS — N390 Urinary tract infection, site not specified: Secondary | ICD-10-CM

## 2013-02-01 DIAGNOSIS — E785 Hyperlipidemia, unspecified: Secondary | ICD-10-CM | POA: Insufficient documentation

## 2013-02-01 DIAGNOSIS — Z23 Encounter for immunization: Secondary | ICD-10-CM

## 2013-02-01 DIAGNOSIS — Z1211 Encounter for screening for malignant neoplasm of colon: Secondary | ICD-10-CM

## 2013-02-01 DIAGNOSIS — R413 Other amnesia: Secondary | ICD-10-CM | POA: Insufficient documentation

## 2013-02-01 DIAGNOSIS — I10 Essential (primary) hypertension: Secondary | ICD-10-CM

## 2013-02-01 LAB — LIPID PANEL
Cholesterol: 287 mg/dL — ABNORMAL HIGH (ref 0–200)
HDL: 75.3 mg/dL (ref >39.00)
Triglycerides: 168 mg/dL — ABNORMAL HIGH (ref 0.0–149.0)

## 2013-02-01 LAB — POCT URINALYSIS DIPSTICK
Bilirubin, UA: NEGATIVE
Glucose, UA: NEGATIVE
Leukocytes, UA: NEGATIVE
Nitrite, UA: NEGATIVE

## 2013-02-01 LAB — VITAMIN B12: Vitamin B-12: 412 pg/mL (ref 211–911)

## 2013-02-01 LAB — RPR

## 2013-02-01 MED ORDER — DONEPEZIL HCL 5 MG PO TABS: 5.0000 mg | ORAL_TABLET | Freq: Every day | ORAL | Status: DC

## 2013-02-01 MED ORDER — ATORVASTATIN CALCIUM 10 MG PO TABS: 10.0000 mg | ORAL_TABLET | Freq: Every day | ORAL | Status: DC

## 2013-02-01 NOTE — Patient Instructions (Signed)
It was good to see you today. We have reviewed your prior records including labs and tests today we will send to your prior provider(s) for "release of records" as discussed today. Test(s) ordered today. Your results will be released to MyChart (or called to you) after review, usually within 72hours after test completion. If any changes need to be made, you will be notified at that same time. Medications reviewed and updated - Aricept 5 mg at bedtime for memory and atorvastatin 10 mg daily for cholesterol - no other changes recommended at this time Your prescription(s) have been submitted to your pharmacy. Please take as directed and contact our office if you believe you are having problem(s) with the medication(s). Health Maintenance reviewed - Tetanus and pneumonia vaccines updated today, will refer for colonoscopy screening as discussed, my office will call regarding this appointment Continue working with other specialists as ongoing followup in 3 months for blood pressure check and medication review, please call sooner if problems

## 2013-02-01 NOTE — Assessment & Plan Note (Signed)
BP Readings from Last 3 Encounters:  02/01/13 148/70  12/31/12 183/75  12/31/12 178/66   subop control -  Reports intolerance to "new med" as rx'd by prior PCP for same Send for ROI - no changes advised at this time

## 2013-02-01 NOTE — Progress Notes (Signed)
Subjective:    Patient ID: [name], female    DOB: 10/17/1933, 79 y.o.   MRN: [id number]  HPI  New patient to me and our practice, here to establish with new primary care provider Follows with local oncology group and gen surgery  Reviewed chronic medical issue: CLL, currently in remission - diagnosed in 2009, partial remission achieved with Arzenna as needed - follows with oncology for same  Breast cancer - diagnosed 2009, status post mastectomy in treatment for same. Remains on tamoxifen and follows regularly with oncology  History of stroke September 2010 - ongoing risk factor treatment and aspirin therapy - recurrent TIA with vertigo symptoms may 2013, no other recurrent symptoms since that time  Hypertension - the patient reports compliance with medication(s) as prescribed. Denies adverse side effects.  also concerned about memory loss - describes difficulty initiating conversation and word finding problems - prro recollection of short term event but denies problems with $ or directions. symptoms also noted by family and friends - associated with poor balance since 01/2012 TIA symptoms   Past Medical History  Diagnosis Date  . 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 Stroke 05/2009    05/2009  Right upper extrem/leg weakness; nuchal pain;  . Benign essential HTN   . Macrocytic anemia   . Vertigo     ?TIA - MRI brain 5/13  No new CVA  . Pleurisy     01/16/12 pleuritic right chest pain  V/Q scan [facility] low probability PE  . Breast cancer left T2N0 S/P mastectomy and SLN Bx 09/2010 2009  . CLL (chronic lymphoid leukemia) in relapse   . Arthritis   . Hyperlipidemia    Family History  Problem Relation Age of Onset  . Breast cancer Mother   . Hyperlipidemia    . Stroke    . Hypertension    . Arthritis     History  Substance Use Topics  . Smoking  status: Never Smoker   . Smokeless tobacco: Not on file  . Alcohol Use: No    Review of Systems Constitutional: Negative for fever or weight change.  Respiratory: Negative for cough and shortness of breath.   Cardiovascular: Negative for chest pain or palpitations.  Gastrointestinal: Negative for abdominal pain, no bowel changes.  GU: +dysuria Musculoskeletal: Negative for gait problem or joint swelling.  Skin: Negative for rash.  Neurological: Negative for headache. +balance problems and vertigo intermittent, but no falls No other specific complaints in a complete review of systems (except as listed in HPI above).     Objective:   Physical Exam BP 148/70  Pulse 57  Temp(Src) 97.9 F (36.6 C) (Oral)  Ht 5' 3.5" (1.613 m)  Wt 273 lb (123.832 kg)  BMI 47.6 kg/m2  SpO2 97% Wt Readings from Last 3 Encounters:  02/01/13 273 lb (123.832 kg)  12/31/12 270 lb 4.8 oz (122.607 kg)  12/10/12 270 lb 3.2 oz (122.562 kg)   Constitutional: She is overweight, but appears well-developed and well-nourished. No distress. Dtr at side HENT: Head: Normocephalic and atraumatic. Ears: B TMs ok, no erythema or effusion; Nose: Nose normal. Mouth/Throat: Oropharynx is clear and moist. No oropharyngeal exudate.  Eyes: Conjunctivae and EOM are normal. Pupils are equal, round, and reactive to light. No scleral icterus.  Neck: Normal range of motion. Neck supple. No JVD present. No thyromegaly present.  Cardiovascular:  Normal rate, regular rhythm and normal heart sounds.  No murmur heard. No BLE edema. Pulmonary/Chest: Effort normal and breath sounds normal. No respiratory distress. She has no wheezes.  Abdominal: Soft. Bowel sounds are normal. She exhibits no distension. There is no tenderness. no masses Musculoskeletal: Normal range of motion, no joint effusions. No gross deformities Neurological: She is alert and oriented to person, place, and time. No cranial nerve deficit. Mild/min chronic R HP U>L.  Coordination, balance, overall strength and assisted gait are normal. Recall 2/3 at 3 minutes - slight delay in initiation of conversation, but fleuent without dysarthria or aphasia Skin: Skin is warm and dry. No rash noted. No erythema.  Psychiatric: She has a normal mood and affect. Her behavior is normal. Judgment and thought content normal.   Lab Results  Component Value Date   WBC 7.2 12/31/2012   HGB 11.3* 12/31/2012   HCT 32.3* 12/31/2012   PLT 92* 12/31/2012   GLUCOSE 104* 12/31/2012   CHOL  Value: 167                06/13/2010   TRIG 119 06/13/2010   HDL 58 06/13/2010   LDLCALC  Value: 85        06/13/2010   ALT 23 12/31/2012   AST 21 12/31/2012   NA 141 12/31/2012   K 4.1 12/31/2012   CL 107 12/31/2012   CREATININE 1.2* 12/31/2012   BUN 22.7 12/31/2012   CO2 24 12/31/2012   TSH 1.219 06/13/2010   INR 0.98 02/19/2012   HGBA1C  Value: 5.5  06/13/2010       Assessment & Plan:    See problem list. Medications and labs reviewed today.  Dysuria - POC Udip unremarkable but send for Ucx  Time spent with pt today 62 minutes, greater than 50% time spent counseling patient on memory concerns, hypertension, CVA risk mgmt and medication review. Also review of prior records

## 2013-02-01 NOTE — Assessment & Plan Note (Signed)
Hx and exam consistent with early dementia - suspect vascular dementia Check screening labs now, reviewed MRI from 01/2012 Start aricept and consider eval by neuro if progressive symptoms

## 2013-02-01 NOTE — Assessment & Plan Note (Signed)
Despite hx CVA 05/2009 and TIA 01/2012, denies ever rx'd statin Check lipids now and begin atorva 10g qd

## 2013-02-02 LAB — URINE CULTURE
Colony Count: NO GROWTH
Organism ID, Bacteria: NO GROWTH

## 2013-02-09 ENCOUNTER — Encounter: Payer: Self-pay | Admitting: Internal Medicine

## 2013-02-17 ENCOUNTER — Encounter: Payer: Self-pay | Admitting: Internal Medicine

## 2013-02-25 ENCOUNTER — Other Ambulatory Visit (HOSPITAL_BASED_OUTPATIENT_CLINIC_OR_DEPARTMENT_OTHER): Payer: Medicare Other

## 2013-02-25 ENCOUNTER — Other Ambulatory Visit: Payer: Medicare Other | Admitting: Lab

## 2013-02-25 DIAGNOSIS — C50919 Malignant neoplasm of unspecified site of unspecified female breast: Secondary | ICD-10-CM

## 2013-02-25 DIAGNOSIS — C50419 Malignant neoplasm of upper-outer quadrant of unspecified female breast: Secondary | ICD-10-CM

## 2013-02-25 DIAGNOSIS — C9112 Chronic lymphocytic leukemia of B-cell type in relapse: Secondary | ICD-10-CM

## 2013-02-25 DIAGNOSIS — C8590 Non-Hodgkin lymphoma, unspecified, unspecified site: Secondary | ICD-10-CM

## 2013-02-25 DIAGNOSIS — C9192 Lymphoid leukemia, unspecified, in relapse: Secondary | ICD-10-CM

## 2013-02-25 LAB — CBC WITH DIFFERENTIAL/PLATELET
BASO%: 0.4 % (ref 0.0–2.0)
EOS%: 1.5 % (ref 0.0–7.0)
HGB: 11.1 g/dL — ABNORMAL LOW (ref 11.6–15.9)
MCH: 39.5 pg — ABNORMAL HIGH (ref 25.1–34.0)
MCHC: 34.9 g/dL (ref 31.5–36.0)
MONO#: 0.4 10*3/uL (ref 0.1–0.9)
RDW: 15 % — ABNORMAL HIGH (ref 11.2–14.5)
WBC: 8.4 10*3/uL (ref 3.9–10.3)
lymph#: 6.2 10*3/uL — ABNORMAL HIGH (ref 0.9–3.3)

## 2013-02-25 LAB — COMPREHENSIVE METABOLIC PANEL (CC13)
ALT: 19 U/L (ref 0–55)
AST: 18 U/L (ref 5–34)
Creatinine: 1.2 mg/dL — ABNORMAL HIGH (ref 0.6–1.1)
Total Bilirubin: 0.59 mg/dL (ref 0.20–1.20)
Total Protein: 6.4 g/dL (ref 6.4–8.3)

## 2013-03-11 ENCOUNTER — Other Ambulatory Visit: Payer: Self-pay | Admitting: Oncology

## 2013-03-18 NOTE — Telephone Encounter (Signed)
e

## 2013-03-29 ENCOUNTER — Ambulatory Visit (AMBULATORY_SURGERY_CENTER): Payer: Medicare Other

## 2013-03-29 ENCOUNTER — Telehealth: Payer: Self-pay | Admitting: Internal Medicine

## 2013-03-29 VITALS — Ht 62.5 in | Wt 269.0 lb

## 2013-03-29 DIAGNOSIS — Z1211 Encounter for screening for malignant neoplasm of colon: Secondary | ICD-10-CM

## 2013-03-29 DIAGNOSIS — Z853 Personal history of malignant neoplasm of breast: Secondary | ICD-10-CM

## 2013-03-29 MED ORDER — MOVIPREP 100 G PO SOLR: ORAL | Status: DC

## 2013-03-29 NOTE — Telephone Encounter (Signed)
Called pt back no answer LMOM need OV before md could rx something...lmb

## 2013-03-29 NOTE — Telephone Encounter (Signed)
Has had a head cold for over 3 weeks, request something be called into [facility] [address] in [place]

## 2013-03-30 ENCOUNTER — Encounter: Payer: Self-pay | Admitting: Oncology

## 2013-03-30 ENCOUNTER — Encounter: Payer: Self-pay | Admitting: Internal Medicine

## 2013-03-30 NOTE — Progress Notes (Signed)
The patient had left a message with Darlena and I called her back. She is wanting financial assistance. I called and gave her the billing ph# to call. She needs to have a colonoscopy. I advised she would have to get all details from billing [phone]

## 2013-03-31 ENCOUNTER — Encounter: Payer: Self-pay | Admitting: Oncology

## 2013-03-31 NOTE — Progress Notes (Signed)
Patient left message for me to call her back. She said she call c/s and was told to call me back because they don't mail out applications anymore?? She doesn't have the name of the person she spoke with--see previous notes. Her bill should be over 5000.00 so she should be able to apply for hardship. I confirmed her address and advised her I would send her an application. [address] I will update and mail.

## 2013-04-10 ENCOUNTER — Other Ambulatory Visit: Payer: Self-pay | Admitting: Oncology

## 2013-04-10 DIAGNOSIS — C50912 Malignant neoplasm of unspecified site of left female breast: Secondary | ICD-10-CM

## 2013-04-12 ENCOUNTER — Ambulatory Visit (AMBULATORY_SURGERY_CENTER): Payer: Medicare Other | Admitting: Internal Medicine

## 2013-04-12 ENCOUNTER — Encounter: Payer: Self-pay | Admitting: Internal Medicine

## 2013-04-12 VITALS — BP 161/64 | HR 56 | Temp 97.8°F | Resp 16 | Ht 62.0 in | Wt 269.0 lb

## 2013-04-12 DIAGNOSIS — Z1211 Encounter for screening for malignant neoplasm of colon: Secondary | ICD-10-CM

## 2013-04-12 MED ORDER — SODIUM CHLORIDE 0.9 % IV SOLN: 500.0000 mL | INTRAVENOUS | Status: DC

## 2013-04-12 NOTE — Patient Instructions (Signed)
YOU HAD AN ENDOSCOPIC PROCEDURE TODAY AT THE [facility]: Refer to the procedure report that was given to you for any specific questions about what was found during the examination.  If the procedure report does not answer your questions, please call your gastroenterologist to clarify.  If you requested that your care partner not be given the details of your procedure findings, then the procedure report has been included in a sealed envelope for you to review at your convenience later.  YOU SHOULD EXPECT: Some feelings of bloating in the abdomen. Passage of more gas than usual.  Walking can help get rid of the air that was put into your GI tract during the procedure and reduce the bloating. If you had a lower endoscopy (such as a colonoscopy or flexible sigmoidoscopy) you may notice spotting of blood in your stool or on the toilet paper. If you underwent a bowel prep for your procedure, then you may not have a normal bowel movement for a few days.  DIET: Your first meal following the procedure should be a light meal and then it is ok to progress to your normal diet.  A half-sandwich or bowl of soup is an example of a good first meal.  Heavy or fried foods are harder to digest and may make you feel nauseous or bloated.  Likewise meals heavy in dairy and vegetables can cause extra gas to form and this can also increase the bloating.  Drink plenty of fluids but you should avoid alcoholic beverages for 24 hours.  ACTIVITY: Your care partner should take you home directly after the procedure.  You should plan to take it easy, moving slowly for the rest of the day.  You can resume normal activity the day after the procedure however you should NOT DRIVE or use heavy machinery for 24 hours (because of the sedation medicines used during the test).    SYMPTOMS TO REPORT IMMEDIATELY: A gastroenterologist can be reached at any hour.  During normal business hours, 8:30 AM to 5:00 PM Monday through Friday,  call [phone].  After hours and on weekends, please call the GI answering service at [phone] who will take a message and have the physician on call contact you.   Following lower endoscopy (colonoscopy or flexible sigmoidoscopy):  Excessive amounts of blood in the stool  Significant tenderness or worsening of abdominal pains  Swelling of the abdomen that is new, acute  Fever of 100F or higher    FOLLOW UP: If any biopsies were taken you will be contacted by phone or by letter within the next 1-3 weeks.  Call your gastroenterologist if you have not heard about the biopsies in 3 weeks.  Our staff will call the home number listed on your records the next business day following your procedure to check on you and address any questions or concerns that you may have at that time regarding the information given to you following your procedure. This is a courtesy call and so if there is no answer at the home number and we have not heard from you through the emergency physician on call, we will assume that you have returned to your regular daily activities without incident.  SIGNATURES/CONFIDENTIALITY: You and/or your care partner have signed paperwork which will be entered into your electronic medical record.  These signatures attest to the fact that that the information above on your After Visit Summary has been reviewed and is understood.  Full responsibility of the confidentiality   of this discharge information lies with you and/or your care-partner.     

## 2013-04-12 NOTE — Op Note (Signed)
[facility] [address] [place], [address]   COLONOSCOPY PROCEDURE REPORT  PATIENT: [name], [name]  MR#: [id number] BIRTHDATE: November 03, 1933 , 79  yrs. old GENDER: Female ENDOSCOPIST: Beverley Fiedler, MD REFERRED [id number] Felicity Coyer, M.D. PROCEDURE DATE:  04/12/2013 PROCEDURE:   Colonoscopy, screening ASA CLASS:   Class III INDICATIONS:average risk screening and first colonoscopy. MEDICATIONS: MAC sedation, administered by CRNA, Propofol (Diprivan), and Propofol (Diprivan) 280 mg IV  DESCRIPTION OF PROCEDURE:   After the risks benefits and alternatives of the procedure were thoroughly explained, informed consent was obtained.  A digital rectal exam revealed no rectal mass and A digital rectal exam revealed decreased sphincter tone. The LB [id number]-[id number]  endoscope was introduced through the anus and advanced to the cecum, which was identified by both the appendix and ileocecal valve. No adverse events experienced.   The quality of the prep was good, using MoviPrep  The instrument was then slowly withdrawn as the colon was fully examined.    COLON FINDINGS: There was moderate diverticulosis noted in the ascending colon and sigmoid colon with associated tortuosity and muscular hypertrophy.  A lipomatous IC valve was noted.  The colon was otherwise normal.  There was no inflammation, polyps or cancers unless previously stated.  Retroflexed views revealed external hemorrhoids. The time to cecum=6 minutes 24 seconds.  Withdrawal time=14 minutes 22 seconds.  The scope was withdrawn and the procedure completed.  COMPLICATIONS: There were no complications.  ENDOSCOPIC IMPRESSION: 1.   There was moderate diverticulosis noted in the ascending colon and sigmoid colon 2.   The colon was otherwise normal  RECOMMENDATIONS: 1.  High fiber diet 2.  Given your age, you will not need another colonoscopy for colon cancer screening or polyp surveillance.  These types of  tests usually stop around the age 82.   eSigned:  Beverley Fiedler, MD 04/12/2013 11:26 AM   cc: The Patient and Newt Lukes, MD

## 2013-04-12 NOTE — Progress Notes (Signed)
Patient did not experience any of the following events: a burn prior to discharge; a fall within the facility; wrong site/side/patient/procedure/implant event; or a hospital transfer or hospital admission upon discharge from the facility. (G8907) Patient did not have preoperative order for IV antibiotic SSI prophylaxis. (G8918)  

## 2013-04-13 ENCOUNTER — Telehealth: Payer: Self-pay

## 2013-04-13 NOTE — Telephone Encounter (Signed)
  Follow up Call-  Call back number 04/12/2013  Post procedure Call Back phone  # [phone]  Permission to leave phone message Yes     Patient questions:  Do you have a fever, pain , or abdominal swelling? no Pain Score  0 *  Have you tolerated food without any problems? yes  Have you been able to return to your normal activities? yes  Do you have any questions about your discharge instructions: Diet   no Medications  no Follow up visit  no  Do you have questions or concerns about your Care? no  Actions: * If pain score is 4 or above: No action needed, pain <4.

## 2013-04-14 NOTE — Telephone Encounter (Signed)
Error

## 2013-04-27 ENCOUNTER — Other Ambulatory Visit: Payer: Self-pay | Admitting: Oncology

## 2013-04-29 ENCOUNTER — Ambulatory Visit (HOSPITAL_BASED_OUTPATIENT_CLINIC_OR_DEPARTMENT_OTHER): Payer: Medicare Other | Admitting: Nurse Practitioner

## 2013-04-29 ENCOUNTER — Telehealth: Payer: Self-pay | Admitting: Oncology

## 2013-04-29 ENCOUNTER — Other Ambulatory Visit (HOSPITAL_BASED_OUTPATIENT_CLINIC_OR_DEPARTMENT_OTHER): Payer: Medicare Other

## 2013-04-29 VITALS — BP 200/73 | HR 61 | Temp 97.0°F | Resp 20 | Ht 62.0 in | Wt 268.0 lb

## 2013-04-29 DIAGNOSIS — C9112 Chronic lymphocytic leukemia of B-cell type in relapse: Secondary | ICD-10-CM

## 2013-04-29 DIAGNOSIS — C9192 Lymphoid leukemia, unspecified, in relapse: Secondary | ICD-10-CM

## 2013-04-29 DIAGNOSIS — C8589 Other specified types of non-Hodgkin lymphoma, extranodal and solid organ sites: Secondary | ICD-10-CM

## 2013-04-29 DIAGNOSIS — C8590 Non-Hodgkin lymphoma, unspecified, unspecified site: Secondary | ICD-10-CM

## 2013-04-29 DIAGNOSIS — C50912 Malignant neoplasm of unspecified site of left female breast: Secondary | ICD-10-CM

## 2013-04-29 DIAGNOSIS — C50919 Malignant neoplasm of unspecified site of unspecified female breast: Secondary | ICD-10-CM

## 2013-04-29 LAB — COMPREHENSIVE METABOLIC PANEL (CC13)
BUN: 15.9 mg/dL (ref 7.0–26.0)
CO2: 20 mEq/L — ABNORMAL LOW (ref 22–29)
Calcium: 9.7 mg/dL (ref 8.4–10.4)
Chloride: 111 mEq/L — ABNORMAL HIGH (ref 98–109)
Creatinine: 1.1 mg/dL (ref 0.6–1.1)
Total Bilirubin: 0.61 mg/dL (ref 0.20–1.20)

## 2013-04-29 LAB — CBC WITH DIFFERENTIAL/PLATELET
BASO%: 0.3 % (ref 0.0–2.0)
Eosinophils Absolute: 0.2 10*3/uL (ref 0.0–0.5)
HCT: 33.5 % — ABNORMAL LOW (ref 34.8–46.6)
LYMPH%: 87.4 % — ABNORMAL HIGH (ref 14.0–49.7)
MCHC: 34.3 g/dL (ref 31.5–36.0)
MCV: 114.9 fL — ABNORMAL HIGH (ref 79.5–101.0)
MONO#: 0.4 10*3/uL (ref 0.1–0.9)
MONO%: 2.1 % (ref 0.0–14.0)
NEUT%: 9.2 % — ABNORMAL LOW (ref 38.4–76.8)
Platelets: 40 10*3/uL — ABNORMAL LOW (ref 145–400)
RBC: 2.91 10*6/uL — ABNORMAL LOW (ref 3.70–5.45)
WBC: 16.9 10*3/uL — ABNORMAL HIGH (ref 3.9–10.3)

## 2013-04-29 NOTE — Telephone Encounter (Signed)
gv pt appt schedule for August.  °

## 2013-04-29 NOTE — Progress Notes (Addendum)
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 Most recently she was treated with single agent Arzerra 02/20/2012 through 08/17/2012 achieved a partial response.  She presents today for scheduled followup. Aside from a minor cold no interim illnesses or infections. She denies any fevers. She has periodic hot flashes/sweats. She has stable mild dyspnea on exertion. No cough or chest pain. Appetite varies.   Objective: Blood pressure 200/73, pulse 61, temperature 97 F (36.1 C), temperature source Oral, resp. rate 20, height 5\' 2"  (1.575 m), weight 268 lb (121.564 kg).  Oropharynx is without thrush or ulceration. No palpable cervical, supraclavicular, axillary or inguinal lymph nodes. Lungs are clear. No wheezes or rales. Status post left mastectomy. No evidence of chest wall recurrence. Regular cardiac rhythm. Abdomen soft and nontender. No organomegaly. Trace lower leg edema bilaterally. Motor strength 5 over 5.  Lab Results: Lab Results  Component Value Date   WBC 16.9* 04/29/2013   HGB 11.5* 04/29/2013   HCT 33.5* 04/29/2013   MCV 114.9* 04/29/2013   PLT 40* 04/29/2013    Chemistry:    Chemistry      Component Value Date/Time   NA 143 04/29/2013 1349   NA 139 05/03/2012 1106   NA 142 01/01/2011   K 4.1 04/29/2013 1349   K 3.9 05/03/2012 1106   CL 107 02/25/2013 1028   CL 104 05/03/2012 1106   CO2 20* 04/29/2013 1349   CO2 26 05/03/2012 1106   BUN 15.9 04/29/2013 1349   BUN 19 05/03/2012 1106   BUN 16 01/01/2011   CREATININE 1.1 04/29/2013 1349   CREATININE 1.02 05/03/2012 1106   CREATININE 0.9 01/01/2011   GLU 104 01/01/2011      Component Value Date/Time   CALCIUM 9.7 04/29/2013 1349   CALCIUM 9.4 05/03/2012 1106   ALKPHOS 90 04/29/2013 1349   ALKPHOS 77 05/03/2012 1106   AST 29 04/29/2013 1349   AST 24 05/03/2012 1106   ALT 27 04/29/2013 1349   ALT 26 05/03/2012 1106   BILITOT 0.61 04/29/2013 1349   BILITOT 0.5  05/03/2012 1106       Studies/Results: No results found.  Medications: I have reviewed the patient's current medications.  Assessment/Plan:  1. Well-differentiated lymphocytic lymphoma presenting with rapidly progressive pancytopenia in November 2009. She was found to have left axillary lymphadenopathy and a mass in the left breast. Biopsy of the breast showed DCIS. However, the axillary node showed a lymphoma with immunophenotype consistent with WDLL versus CLL. Disease became rapidly progressive and she became transfusion dependent for red cells and platelets. She was treated with a series of different chemotherapy and immunotherapy agents, initially with fludarabine, Cytoxan and Rituxan, then Campath and ultimately she achieved a partial response to ofatumumab [name]) initially given between April and June of 2010 and then monthly through October 2010. In May 2013 the blood counts began to deteriorate with a fall in the platelet count down to 59,000 and rise in the percent lymphocytes on the white count differential. Treatment with Ofatumumab was resumed. She completed the eighth weekly treatment on 04/20/2012. She began the monthly treatment schedule on 05/25/2012. She completed the course of ofatumumab on 08/17/2012 with a partial response. 2. Initial noninvasive cancers left breast with subsequent invasive cancer ER positive status post left mastectomy. Initial biopsy at time of diagnosis of her lymphoma in September 2009. Definitive treatment delayed until her blood condition was under control. Subsequent left mastectomy with sentinel  lymph node procedure 10/09/2010. Pathology with multifocal invasive cancer up to 2.6 cm with a separate area of DCIS 0.7 cm. Lymph nodes negative for breast cancer. ER/PR strongly positive, HER-2 negative. She was started on hormonal therapy with Aromasin, but was unable to tolerate and was switched to tamoxifen. She continues tamoxifen. 3. Essential  hypertension. 4. Cerebrovascular disease status post stroke in September 2010. 5. Chronic nodular dermatitis. 6. Intermittent allergic reactions to Ofatumumab. Pepcid added to the premedication regimen beginning 03/30/2012. She had multiple subsequent doses without any reaction.  Disposition-[name]'s blood counts have changed consistent with relapse of CLL/well-differentiated lymphocytic lymphoma. The platelet count is lower and the white count is increased. The hemoglobin is stable.  Dr. Cyndie Chime recommends initiating treatment with ibrutinib. We reviewed potential toxicities including fatigue, nausea, diarrhea, rash, myelosuppression. She will begin the Ibrutinib as soon as available.  She will return for blood counts on a weekly schedule. We will see her in followup in 2 weeks. She will contact the office in the interim with any problems.  Patient seen with Dr. Cyndie Chime.   [name] ANP/GNP-BC   Hematology attending: I personally interviewed and examined this patient. I agree with status as recorded above. Her disease is progressing again with rising white count and falling platelet count. I discussed with her and her daughter the availability of a newly approved drug ibrutinib which inhibits abnormal growth  signals through the B cell receptor. It is highly active in multiply relapsed patients. We reviewed potential side effects. Main side effects seen to date have been diarrhea and fatigue. There is a peculiar rise in the white count for the first month or so on the drug despite rapid regression of lymphadenopathy and then the white count begins to fall. We have her some printed information about the drug. She agrees to begin a trial. Dose is three 140 mg capsules taken at the same time for a total dose of 420 mg. We will monitor blood counts weekly and schedule shorter-term followup visit to assess for any toxicities.    Cephas Darby, MD, FACP  Hematology-Oncology

## 2013-05-06 ENCOUNTER — Ambulatory Visit (HOSPITAL_COMMUNITY)
Admission: RE | Admit: 2013-05-06 | Discharge: 2013-05-06 | Disposition: A | Discharge status: Home / Self Care | Payer: Medicare Other | Source: Ambulatory Visit | Attending: Nurse Practitioner | Admitting: Nurse Practitioner

## 2013-05-06 ENCOUNTER — Other Ambulatory Visit: Payer: Self-pay | Admitting: Nurse Practitioner

## 2013-05-06 ENCOUNTER — Ambulatory Visit: Payer: Medicare Other | Admitting: Lab

## 2013-05-06 ENCOUNTER — Ambulatory Visit (HOSPITAL_BASED_OUTPATIENT_CLINIC_OR_DEPARTMENT_OTHER): Payer: Medicare Other | Admitting: Nurse Practitioner

## 2013-05-06 ENCOUNTER — Ambulatory Visit (HOSPITAL_BASED_OUTPATIENT_CLINIC_OR_DEPARTMENT_OTHER): Payer: Medicare Other

## 2013-05-06 ENCOUNTER — Other Ambulatory Visit: Payer: Self-pay

## 2013-05-06 ENCOUNTER — Other Ambulatory Visit: Payer: Self-pay | Admitting: Oncology

## 2013-05-06 VITALS — BP 164/74 | HR 63 | Temp 97.3°F | Resp 20

## 2013-05-06 DIAGNOSIS — Z853 Personal history of malignant neoplasm of breast: Secondary | ICD-10-CM | POA: Insufficient documentation

## 2013-05-06 DIAGNOSIS — C9110 Chronic lymphocytic leukemia of B-cell type not having achieved remission: Secondary | ICD-10-CM

## 2013-05-06 DIAGNOSIS — C9112 Chronic lymphocytic leukemia of B-cell type in relapse: Secondary | ICD-10-CM

## 2013-05-06 DIAGNOSIS — R110 Nausea: Secondary | ICD-10-CM

## 2013-05-06 DIAGNOSIS — R42 Dizziness and giddiness: Secondary | ICD-10-CM

## 2013-05-06 DIAGNOSIS — Z8673 Personal history of transient ischemic attack (TIA), and cerebral infarction without residual deficits: Secondary | ICD-10-CM | POA: Insufficient documentation

## 2013-05-06 DIAGNOSIS — I639 Cerebral infarction, unspecified: Secondary | ICD-10-CM

## 2013-05-06 DIAGNOSIS — C9192 Lymphoid leukemia, unspecified, in relapse: Secondary | ICD-10-CM

## 2013-05-06 DIAGNOSIS — I6789 Other cerebrovascular disease: Secondary | ICD-10-CM | POA: Insufficient documentation

## 2013-05-06 DIAGNOSIS — R930 Abnormal findings on diagnostic imaging of skull and head, not elsewhere classified: Secondary | ICD-10-CM | POA: Insufficient documentation

## 2013-05-06 DIAGNOSIS — C50919 Malignant neoplasm of unspecified site of unspecified female breast: Secondary | ICD-10-CM

## 2013-05-06 DIAGNOSIS — H538 Other visual disturbances: Secondary | ICD-10-CM

## 2013-05-06 LAB — CBC WITH DIFFERENTIAL/PLATELET
Basophils Absolute: 0 10*3/uL (ref 0.0–0.1)
HCT: 30.2 % — ABNORMAL LOW (ref 34.8–46.6)
HGB: 10.2 g/dL — ABNORMAL LOW (ref 11.6–15.9)
MONO#: 0.3 10*3/uL (ref 0.1–0.9)
NEUT%: 12 % — ABNORMAL LOW (ref 38.4–76.8)
Platelets: 64 10*3/uL — ABNORMAL LOW (ref 145–400)
WBC: 12.6 10*3/uL — ABNORMAL HIGH (ref 3.9–10.3)
lymph#: 10.5 10*3/uL — ABNORMAL HIGH (ref 0.9–3.3)

## 2013-05-06 LAB — BASIC METABOLIC PANEL (CC13)
BUN: 13.6 mg/dL (ref 7.0–26.0)
CO2: 21 mEq/L — ABNORMAL LOW (ref 22–29)
Chloride: 111 mEq/L — ABNORMAL HIGH (ref 98–109)
Creatinine: 1.1 mg/dL (ref 0.6–1.1)
Glucose: 141 mg/dl — ABNORMAL HIGH (ref 70–140)

## 2013-05-06 MED ORDER — GADOBENATE DIMEGLUMINE 529 MG/ML IV SOLN
20.0000 mL | Freq: Once | INTRAVENOUS | Status: AC | PRN
  Administered 2013-05-06: 20 mL via INTRAVENOUS

## 2013-05-06 MED ORDER — SODIUM CHLORIDE 0.9 % IJ SOLN
10.0000 mL | INTRAMUSCULAR | Status: DC | PRN
  Administered 2013-05-06: 10 mL via INTRAVENOUS
  Filled 2013-05-06: qty 10

## 2013-05-06 MED ORDER — HEPARIN SOD (PORK) LOCK FLUSH 100 UNIT/ML IV SOLN
500.0000 [IU] | Freq: Once | INTRAVENOUS | Status: AC
  Administered 2013-05-06: 500 [IU] via INTRAVENOUS
  Filled 2013-05-06: qty 5

## 2013-05-06 NOTE — Progress Notes (Addendum)
OFFICE PROGRESS NOTE  Interval history:  [name] is seen today in an unscheduled visit. 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 Most recently she was treated with single agent Arzerra 02/20/2012 through 08/17/2012 achieved a partial response. She was last seen in the office on 04/29/2013 with blood work at that time showing evidence of relapse. Dr. Cyndie Chime recommended initiating treatment with Ibrutinib.  She came to the office today for a routine Port-A-Cath flush. She requested to be seen for evaluation of dizziness.  She reports she has not felt well all week with no specific symptoms until this morning. After breakfast she developed dizziness, nausea, cloudy vision from both eyes, sweating and ringing in the left ear. She has had similar symptoms in the past but has not experienced nausea or sweating in conjunction. No headache. No focal extremity weakness. She denies diplopia. She denies fever. No chest pain or pressure. No shortness of breath.    Objective: Temperature 97.3, heart rate 63, respirations 20, blood pressure 164/74.  Lungs are clear. Regular cardiac rhythm. Abdomen soft and nontender. No edema. Moves all extremities. Motor strength 5 over 5. Finger to nose intact. She is alert, oriented and appropriate. Cranial nerves II through XII intact. Portacath site unremarkable.  Lab Results: Lab Results  Component Value Date   WBC 12.6* 05/06/2013   HGB 10.2* 05/06/2013   HCT 30.2* 05/06/2013   MCV 112.7* 05/06/2013   PLT 64* 05/06/2013    Chemistry:    Chemistry      Component Value Date/Time   NA 142 05/06/2013 1009   NA 139 05/03/2012 1106   NA 142 01/01/2011   K 4.0 05/06/2013 1009   K 3.9 05/03/2012 1106   CL 107 02/25/2013 1028   CL 104 05/03/2012 1106   CO2 21* 05/06/2013 1009   CO2 26 05/03/2012 1106   BUN 13.6 05/06/2013 1009   BUN 19 05/03/2012 1106   BUN 16 01/01/2011   CREATININE 1.1 05/06/2013 1009    CREATININE 1.02 05/03/2012 1106   CREATININE 0.9 01/01/2011   GLU 104 01/01/2011      Component Value Date/Time   CALCIUM 9.3 05/06/2013 1009   CALCIUM 9.4 05/03/2012 1106   ALKPHOS 90 04/29/2013 1349   ALKPHOS 77 05/03/2012 1106   AST 29 04/29/2013 1349   AST 24 05/03/2012 1106   ALT 27 04/29/2013 1349   ALT 26 05/03/2012 1106   BILITOT 0.61 04/29/2013 1349   BILITOT 0.5 05/03/2012 1106       Studies/Results: Mr [name] ZO Contrast  05/06/2013   *RADIOLOGY REPORT*  Clinical Data: History of chronic lymphocytic leukemia in relapse. History of breast cancer.  Presenting with dizziness and nausea. Rule out stroke.  MRI HEAD WITHOUT AND WITH CONTRAST  Technique:  Multiplanar, multiecho pulse sequences of the brain and surrounding structures were obtained according to standard protocol without and with intravenous contrast  Contrast: 20mL MULTIHANCE GADOBENATE DIMEGLUMINE 529 MG/ML IV SOLN  Comparison: Several prior brain MRs including 02/06/2012, 06/13/2010 and 06/22/2009.  Findings: No acute infarct.  Remote infarct left thalamus.  Mild small vessel disease type changes.  Major intracranial vascular structures are patent.  Diffuse altered signal intensity involving the skull, skull base, clivus and upper cervical spine relatively similar to the 2013 examination although progressive since 2010.  This may reflect changes of leukemia.  Contrast not administered to evaluate for parenchymal enhancing lesion or dural enhancement.  No abnormality is seen involving the dura  on the FLAIR sequence.  Major intracranial vascular structures are patent.  Cerebellar tonsils minimally low-lying but within range normal limits and without change.  IMPRESSION: No acute infarct.  Remote infarct left thalamus.  Mild small vessel disease type changes.  Diffuse altered signal intensity involving the skull, skull base, clivus and upper cervical spine relatively similar to the 2013 examination although progressive since 2010.  This may reflect  changes of leukemia.  Contrast not administered to evaluate for parenchymal enhancing lesion or dural enhancement.  No abnormality is seen involving the dura on the FLAIR sequence.   Original Report Authenticated By: Lacy Duverney, M.D.    Medications: I have reviewed the patient's current medications.  Assessment/Plan:  1. Dizziness, nausea, cloudy vision, sweating and ringing in the left ear. She has had similar symptoms in the past though has not experienced nausea and sweating. 2. CLL/well-differentiated lymphoma. Recent relapse with plans to begin ibrutinib.  3. Hypertension. 4. Cerebrovascular disease status post stroke in September 2010. 5. Breast cancer on tamoxifen.  Disposition-we obtained an EKG and urgent MRI of the brain. No etiology for her symptoms was identified. Results were reviewed with [name] and her daughter. The symptoms she was experiencing earlier this morning had essentially resolved by the time she left the office. She will followup as scheduled. She and her daughter understand to seek emergency evaluation with any change in her condition.  Patient seen with Dr. Cyndie Chime.    Lonna Cobb ANP/GNP-BC   Hematology attending: Patient was interviewed and I did a complete exam. I agree with the assessment and plan  above and directed further diagnostic testing including electrocardiogram to rule out a cardiac event and MRI of the brain to exclude recurrent stroke. This is a pleasant but complicated 79 year old woman with multiply relapsed well-differentiated lymphocytic lymphoma who is about to start a new therapy with ibrutinib. She has had a falling hemoglobin and platelet count which were initial presentations of her disease. She has a history of recurrent, atypical, neurologic symptoms and poorly controlled hypertension. She presented for a routine office visit today to have her Port-A-Cath infusion device flushed and complained of a sense of impending doom with  vertigo not related to orthostatic changes in position. She had no focal neurologic deficits on a complete exam by myself or my nurse practitioner. She is already on aspirin. Her platelet count has recently fallen down to 40,000 in view of progression of her lymphoma. Additional antiplatelet agents would be contraindicated and I am reluctant to start her on parenteral heparin like anticoagulants unless there is an absolute indication.  Electrocardiogram was done and I reviewed it. Other than sinus bradycardia, there are no changes to suggest acute ischemia. MRI of the brain shows chronic changes and small vessel disease also nothing acute.  We'll continue to monitor her clinical status. I think it is safe for her to proceed with the newly planned anti-lymphoma treatment.   Cephas Darby, MD, FACP  Hematology-Oncology

## 2013-05-06 NOTE — Progress Notes (Signed)
Pt asked to see MD or NP. C/o dizziness (she is spinning, not the room), ringing in L ear, nausea, and weakness. Just wants to lay down and sleep. A little cloudier in both eyes. No vomiting, no sinus symptoms. Olivia Cobb NP called and she will talk with [name] and call back. [name] saw pt and ordered labs, EKG and MRI. Labs were drawn, ekg was done and pt sent to MRI via wheelchair with daughter

## 2013-05-09 ENCOUNTER — Encounter: Payer: Self-pay | Admitting: Nurse Practitioner

## 2013-05-10 ENCOUNTER — Ambulatory Visit: Payer: Medicare Other | Admitting: Internal Medicine

## 2013-05-10 ENCOUNTER — Ambulatory Visit: Payer: Medicare Other | Admitting: Nurse Practitioner

## 2013-05-11 ENCOUNTER — Telehealth: Payer: Self-pay | Admitting: *Deleted

## 2013-05-11 ENCOUNTER — Other Ambulatory Visit: Payer: Self-pay | Admitting: *Deleted

## 2013-05-11 DIAGNOSIS — C8590 Non-Hodgkin lymphoma, unspecified, unspecified site: Secondary | ICD-10-CM

## 2013-05-11 DIAGNOSIS — C9192 Lymphoid leukemia, unspecified, in relapse: Secondary | ICD-10-CM

## 2013-05-11 MED ORDER — IBRUTINIB 140 MG PO CAPS: 420.0000 mg | ORAL_CAPSULE | Freq: Every day | ORAL | Status: DC

## 2013-05-11 NOTE — Telephone Encounter (Signed)
Received prior auth for imbruvica from [employer].  This was forwarded to [facility].

## 2013-05-12 ENCOUNTER — Encounter: Payer: Self-pay | Admitting: Oncology

## 2013-05-12 NOTE — Progress Notes (Signed)
[place], [id number], approved imbruvica 140mg   From 05/12/13-09/28/13.

## 2013-05-13 ENCOUNTER — Ambulatory Visit (INDEPENDENT_AMBULATORY_CARE_PROVIDER_SITE_OTHER): Payer: Medicare Other | Admitting: Internal Medicine

## 2013-05-13 ENCOUNTER — Encounter: Payer: Self-pay | Admitting: Internal Medicine

## 2013-05-13 ENCOUNTER — Telehealth: Payer: Self-pay | Admitting: Oncology

## 2013-05-13 ENCOUNTER — Other Ambulatory Visit (HOSPITAL_BASED_OUTPATIENT_CLINIC_OR_DEPARTMENT_OTHER): Payer: Medicare Other | Admitting: Lab

## 2013-05-13 ENCOUNTER — Encounter: Payer: Self-pay | Admitting: Oncology

## 2013-05-13 ENCOUNTER — Ambulatory Visit (HOSPITAL_BASED_OUTPATIENT_CLINIC_OR_DEPARTMENT_OTHER): Payer: Medicare Other | Admitting: Nurse Practitioner

## 2013-05-13 VITALS — BP 142/62 | HR 57 | Temp 98.2°F | Wt 268.0 lb

## 2013-05-13 VITALS — BP 156/62 | Temp 98.2°F | Resp 19 | Ht 62.0 in | Wt 266.9 lb

## 2013-05-13 DIAGNOSIS — E785 Hyperlipidemia, unspecified: Secondary | ICD-10-CM

## 2013-05-13 DIAGNOSIS — C9112 Chronic lymphocytic leukemia of B-cell type in relapse: Secondary | ICD-10-CM

## 2013-05-13 DIAGNOSIS — R413 Other amnesia: Secondary | ICD-10-CM

## 2013-05-13 DIAGNOSIS — D0590 Unspecified type of carcinoma in situ of unspecified breast: Secondary | ICD-10-CM

## 2013-05-13 DIAGNOSIS — C9192 Lymphoid leukemia, unspecified, in relapse: Secondary | ICD-10-CM

## 2013-05-13 DIAGNOSIS — D487 Neoplasm of uncertain behavior of other specified sites: Secondary | ICD-10-CM

## 2013-05-13 DIAGNOSIS — R42 Dizziness and giddiness: Secondary | ICD-10-CM

## 2013-05-13 LAB — COMPREHENSIVE METABOLIC PANEL (CC13)
ALT: 29 U/L (ref 0–55)
AST: 25 U/L (ref 5–34)
Albumin: 3.9 g/dL (ref 3.5–5.0)
Alkaline Phosphatase: 78 U/L (ref 40–150)
BUN: 25.9 mg/dL (ref 7.0–26.0)
CO2: 23 mEq/L (ref 22–29)
Calcium: 9.4 mg/dL (ref 8.4–10.4)
Chloride: 110 mEq/L — ABNORMAL HIGH (ref 98–109)
Creatinine: 1.3 mg/dL — ABNORMAL HIGH (ref 0.6–1.1)
Glucose: 108 mg/dl (ref 70–140)
Potassium: 4.2 mEq/L (ref 3.5–5.1)
Sodium: 143 mEq/L (ref 136–145)
Total Bilirubin: 0.5 mg/dL (ref 0.20–1.20)
Total Protein: 6.5 g/dL (ref 6.4–8.3)

## 2013-05-13 LAB — CBC WITH DIFFERENTIAL/PLATELET
BASO%: 0.3 % (ref 0.0–2.0)
Basophils Absolute: 0 10*3/uL (ref 0.0–0.1)
EOS%: 1 % (ref 0.0–7.0)
Eosinophils Absolute: 0.2 10*3/uL (ref 0.0–0.5)
HCT: 30.1 % — ABNORMAL LOW (ref 34.8–46.6)
HGB: 10.1 g/dL — ABNORMAL LOW (ref 11.6–15.9)
LYMPH%: 80.5 % — ABNORMAL HIGH (ref 14.0–49.7)
MCH: 38 pg — ABNORMAL HIGH (ref 25.1–34.0)
MCHC: 33.6 g/dL (ref 31.5–36.0)
MCV: 113.2 fL — ABNORMAL HIGH (ref 79.5–101.0)
MONO#: 1.3 10*3/uL — ABNORMAL HIGH (ref 0.1–0.9)
MONO%: 9 % (ref 0.0–14.0)
NEUT#: 1.3 10*3/uL — ABNORMAL LOW (ref 1.5–6.5)
NEUT%: 9.2 % — ABNORMAL LOW (ref 38.4–76.8)
Platelets: 60 10*3/uL — ABNORMAL LOW (ref 145–400)
RBC: 2.66 10*6/uL — ABNORMAL LOW (ref 3.70–5.45)
RDW: 15.5 % — ABNORMAL HIGH (ref 11.2–14.5)
WBC: 14.4 10*3/uL — ABNORMAL HIGH (ref 3.9–10.3)
lymph#: 11.6 10*3/uL — ABNORMAL HIGH (ref 0.9–3.3)

## 2013-05-13 LAB — LACTATE DEHYDROGENASE (CC13): LDH: 267 U/L — ABNORMAL HIGH (ref 125–245)

## 2013-05-13 NOTE — Progress Notes (Signed)
Subjective:    Patient ID: [name], female    DOB: Jun 10, 1934, 79 y.o.   MRN: [id number]  HPI  Here for follow up - reviewed chronic medical issues and interval medical events:  CLL, currently in relapse - initially diagnosed in 2009, partial remission achieved with Arzenna as needed - follows with oncology for same - to begin ibrutinib when medication in stock  Breast cancer - diagnosed 2009, status post mastectomy in treatment for same. Remains on tamoxifen and follows regularly with oncology  History of stroke September 2010 - ongoing risk factor treatment and aspirin therapy - recurrent TIA with vertigo symptoms may 2013, no other recurrent symptoms since that time  Hypertension - the patient reports compliance with medication(s) as prescribed. Denies adverse side effects.  Early dementia with memory loss - began generic Aricept for same 01/2013 - describes difficulty initiating conversation and word finding problems - poor recollection of short term event but denies problems with $ or directions. symptoms also noted by family and friends - associated with poor balance since 01/2012 TIA symptoms   Dyslipidemia - began statin 01/2013 - the patient reports compliance with medication(s) as prescribed. Denies adverse side effects.   Past Medical History  Diagnosis Date  . 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 Stroke 05/2009    05/2009  Right upper extrem/leg weakness; nuchal pain;  . Benign essential HTN   . Macrocytic anemia   . Vertigo     ?TIA - MRI brain 5/13  No new CVA  . Pleurisy     01/16/12 pleuritic right chest pain  V/Q scan [place] [facility] low probability PE  . Breast cancer left T2N0 S/P mastectomy and SLN Bx 09/2010 2009  . CLL (chronic lymphoid leukemia) in relapse   . Arthritis   . Hyperlipidemia     Review of Systems  Constitutional: Negative  for fever or weight change.  Respiratory: Negative for cough and shortness of breath.   Cardiovascular: Negative for chest pain or palpitations.  Neurological: Negative for headache. +balance problems and vertigo intermittent, but no falls     Objective:   Physical Exam  BP 142/62  Pulse 57  Temp(Src) 98.2 F (36.8 C) (Oral)  Wt 268 lb (121.564 kg)  BMI 49.01 kg/m2  SpO2 98% Wt Readings from Last 3 Encounters:  05/13/13 268 lb (121.564 kg)  05/13/13 266 lb 14.4 oz (121.065 kg)  04/29/13 268 lb (121.564 kg)   Constitutional: She is overweight, but appears well-developed and well-nourished. No distress. Dtr at side Neck: Normal range of motion. Neck supple. No JVD present. No thyromegaly present.  Cardiovascular: Normal rate, regular rhythm and normal heart sounds.  No murmur heard. No BLE edema. Pulmonary/Chest: Effort normal and breath sounds normal. No respiratory distress. She has no wheezes.  Neurological: She is alert and oriented to person, place, and time. No cranial nerve deficit. Mild/min chronic R HP U>L. Coordination, balance, overall strength and assisted gait are normal. Recall 2/3 at 3 minutes - slight delay in initiation of conversation, but fleuent without dysarthria or aphasia Skin: Skin is warm and dry. No rash noted. No erythema.  Psychiatric: She has a normal mood and affect. Her behavior is normal. Judgment and thought content normal.   Lab Results  Component Value Date   WBC 14.4* 05/13/2013   HGB 10.1* 05/13/2013   HCT 30.1* 05/13/2013  PLT 60* 05/13/2013   GLUCOSE 108 05/13/2013   CHOL 287* 02/01/2013   TRIG 168.0* 02/01/2013   HDL 75.30 02/01/2013   LDLDIRECT 179.3 02/01/2013   LDLCALC 78 01/01/2011   ALT 29 05/13/2013   AST 25 05/13/2013   NA 143 05/13/2013   K 4.2 05/13/2013   CL 107 02/25/2013   CREATININE 1.3* 05/13/2013   BUN 25.9 05/13/2013   CO2 23 05/13/2013   TSH 1.74 02/01/2013   INR 0.98 02/19/2012   HGBA1C  Value: 5.5 (NOTE)                                                                        According to the ADA Clinical Practice Recommendations for 2011, when HbA1c is used as a screening test:   >=6.5%   Diagnostic of Diabetes Mellitus           (if abnormal result  is confirmed)  5.7-6.4%   Increased risk of developing Diabetes Mellitus  References:Diagnosis and Classification of Diabetes Mellitus,Diabetes Care,2011,34(Suppl 1):S62-S69 and Standards of Medical Care in         Diabetes - 2011,Diabetes Care,2011,34  (Suppl 1):S11-S61. 06/13/2010       Assessment & Plan:    See problem list. Medications and labs reviewed today.

## 2013-05-13 NOTE — Assessment & Plan Note (Signed)
Despite hx CVA 05/2009 and TIA 01/2012, denies ever rx'd statin begin atorva 10g qd 01/2013 -reports tolerating well Recheck lipids now, titrate as needed

## 2013-05-13 NOTE — Assessment & Plan Note (Signed)
relapse 04/2013 - Planning new chemo agent to begin soon Interval hx reviewed Continue working with oncology as ongoing

## 2013-05-13 NOTE — Assessment & Plan Note (Signed)
Chronic, recurrent symptoms Event August 2014 with oncology reviewed, negative EEG and MRI for acute CVA or change Refer for physical therapy, home health preferred Symptomatic support and reassurance provided

## 2013-05-13 NOTE — Assessment & Plan Note (Signed)
Hx and exam consistent with early dementia - suspect vascular dementia Neg screening labs 01/2013, reviewed MRI from 01/2012 began aricept 01/2013 - no adv SE -continue same consider eval by neuro if progressive symptoms -pt/dtr agree

## 2013-05-13 NOTE — Telephone Encounter (Signed)
gave pt appt for lab for 05/17/13

## 2013-05-13 NOTE — Progress Notes (Signed)
OFFICE PROGRESS NOTE  Interval history:  [name] returns as scheduled. 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 On 04/29/2013 labs indicated disease progression with an increase in the white count and decrease in the platelet count. Dr. Cyndie Chime recommended Ibrutinib. She has been approved for the medication but has not received it yet.  When she was in the office on 05/06/2013 for a Port-A-Cath flush she reported dizziness and nausea. She also had a sense of impending doom. EKG showed no changes to suggest acute ischemia. MRI of the brain showed chronic changes and small vessel disease; no acute changes.  The dizziness and nausea have resolved. She notes an increase in sweats. No fever. She notes tenderness at the low back for about one week. No known injury. No rash. No GI or GU complaints.   Objective: Blood pressure 156/62, temperature 98.2 F (36.8 C), temperature source Oral, resp. rate 19, height 5\' 2"  (1.575 m), weight 266 lb 14.4 oz (121.065 kg).  Oropharynx is without thrush or ulceration. No palpable cervical or supraclavicular lymph nodes. Lungs are clear. Regular cardiac rhythm. Port-A-Cath site is without erythema. Abdomen soft, obese. Trace to 1+ lower leg edema bilaterally. Motor strength 5 over 5. Tender over the lower lumbar/upper sacral region. Skin appears mildly erythematous. No rash.  Lab Results: Lab Results  Component Value Date   WBC 14.4* 05/13/2013   HGB 10.1* 05/13/2013   HCT 30.1* 05/13/2013   MCV 113.2* 05/13/2013   PLT 60* 05/13/2013    Chemistry:    Chemistry      Component Value Date/Time   NA 143 05/13/2013 0841   NA 139 05/03/2012 1106   NA 142 01/01/2011   K 4.2 05/13/2013 0841   K 3.9 05/03/2012 1106   CL 107 02/25/2013 1028   CL 104 05/03/2012 1106   CO2 23 05/13/2013 0841   CO2 26 05/03/2012 1106   BUN 25.9 05/13/2013 0841   BUN 19 05/03/2012 1106   BUN 16 01/01/2011   CREATININE 1.3* 05/13/2013  0841   CREATININE 1.02 05/03/2012 1106   CREATININE 0.9 01/01/2011   GLU 104 01/01/2011      Component Value Date/Time   CALCIUM 9.4 05/13/2013 0841   CALCIUM 9.4 05/03/2012 1106   ALKPHOS 78 05/13/2013 0841   ALKPHOS 77 05/03/2012 1106   AST 25 05/13/2013 0841   AST 24 05/03/2012 1106   ALT 29 05/13/2013 0841   ALT 26 05/03/2012 1106   BILITOT 0.50 05/13/2013 0841   BILITOT 0.5 05/03/2012 1106       Studies/Results: Mr Laqueta Jean WJ Contrast  05/06/2013   *RADIOLOGY REPORT*  Clinical Data: History of chronic lymphocytic leukemia in relapse. History of breast cancer.  Presenting with dizziness and nausea. Rule out stroke.  MRI HEAD WITHOUT AND WITH CONTRAST  Technique:  Multiplanar, multiecho pulse sequences of the brain and surrounding structures were obtained according to standard protocol without and with intravenous contrast  Contrast: 20mL MULTIHANCE GADOBENATE DIMEGLUMINE 529 MG/ML IV SOLN  Comparison: Several prior brain MRs including 02/06/2012, 06/13/2010 and 06/22/2009.  Findings: No acute infarct.  Remote infarct left thalamus.  Mild small vessel disease type changes.  Major intracranial vascular structures are patent.  Diffuse altered signal intensity involving the skull, skull base, clivus and upper cervical spine relatively similar to the 2013 examination although progressive since 2010.  This may reflect changes of leukemia.  Contrast not administered to evaluate for parenchymal enhancing lesion or dural enhancement.  No  abnormality is seen involving the dura on the FLAIR sequence.  Major intracranial vascular structures are patent.  Cerebellar tonsils minimally low-lying but within range normal limits and without change.  IMPRESSION: No acute infarct.  Remote infarct left thalamus.  Mild small vessel disease type changes.  Diffuse altered signal intensity involving the skull, skull base, clivus and upper cervical spine relatively similar to the 2013 examination although progressive since 2010.  This may  reflect changes of leukemia.  Contrast not administered to evaluate for parenchymal enhancing lesion or dural enhancement.  No abnormality is seen involving the dura on the FLAIR sequence.   Original Report Authenticated By: Lacy Duverney, M.D.    Medications: I have reviewed the patient's current medications.  Assessment/Plan:  1. Well-differentiated lymphocytic lymphoma presenting with rapidly progressive pancytopenia in November 2009. She was found to have left axillary lymphadenopathy and a mass in the left breast. Biopsy of the breast showed DCIS. However, the axillary node showed a lymphoma with immunophenotype consistent with WDLL versus CLL. Disease became rapidly progressive and she became transfusion dependent for red cells and platelets. She was treated with a series of different chemotherapy and immunotherapy agents, initially with fludarabine, Cytoxan and Rituxan, then Campath and ultimately she achieved a partial response to ofatumumab Suzan Nailer) initially given between April and June of 2010 and then monthly through October 2010. In May 2013 the blood counts began to deteriorate with a fall in the platelet count down to 59,000 and rise in the percent lymphocytes on the white count differential. Treatment with Ofatumumab was resumed. She completed the eighth weekly treatment on 04/20/2012. She began the monthly treatment schedule on 05/25/2012. She completed the course of ofatumumab on 08/17/2012 with a partial response. Labs 04/29/2013 with increase in the white count and decrease in the platelet count consistent with disease progression. Ibrutinib has been prescribed. 2. Initial noninvasive cancers left breast with subsequent invasive cancer ER positive status post left mastectomy. Initial biopsy at time of diagnosis of her lymphoma in September 2009. Definitive treatment delayed until her blood condition was under control. Subsequent left mastectomy with sentinel lymph node procedure  10/09/2010. Pathology with multifocal invasive cancer up to 2.6 cm with a separate area of DCIS 0.7 cm. Lymph nodes negative for breast cancer. ER/PR strongly positive, HER-2 negative. She was started on hormonal therapy with Aromasin, but was unable to tolerate and was switched to tamoxifen. She continues tamoxifen. 3. Essential hypertension. 4. Cerebrovascular disease status post stroke in September 2010. 5. Chronic nodular dermatitis. 6. Intermittent allergic reactions to Ofatumumab. Pepcid added to the premedication regimen beginning 03/30/2012. She had multiple subsequent doses without any reaction. 7. Tenderness at the low back. She will contact the office if this worsens or she develops a skin rash. 8. Dizziness/nausea when here on 05/06/2013. EKG and MRI without obvious etiology of symptoms. The symptoms have resolved.  Disposition-Ms. Koslowski appears stable. She has been approved for Ibrutinib but has not received it yet. She will contact the office when she receives the medication so we can document a start date. We will continue to check labs on a weekly schedule. She has a followup visit with Dr. Cyndie Chime in 2 weeks. She will contact the office in the interim as outlined above or with any other problems.  Lonna Cobb ANP/GNP-BC

## 2013-05-13 NOTE — Patient Instructions (Signed)
It was good to see you today. We have reviewed your prior records including labs and tests today Medications reviewed and updated, no changes recommended at this time. we'll make referral to home health physical therapy for help with dizziness spells . Our office will contact you regarding appointment(s) once made. Will check cholesterol at next lab draw with oncologist and contact you regarding these results Continue working with other specialists as ongoing followup in 6 months for blood pressure check and medication review, please call sooner if problems

## 2013-05-16 ENCOUNTER — Other Ambulatory Visit: Payer: Self-pay | Admitting: Oncology

## 2013-05-16 DIAGNOSIS — C9112 Chronic lymphocytic leukemia of B-cell type in relapse: Secondary | ICD-10-CM

## 2013-05-17 ENCOUNTER — Other Ambulatory Visit (HOSPITAL_BASED_OUTPATIENT_CLINIC_OR_DEPARTMENT_OTHER): Payer: Medicare Other | Admitting: Lab

## 2013-05-17 DIAGNOSIS — D0590 Unspecified type of carcinoma in situ of unspecified breast: Secondary | ICD-10-CM

## 2013-05-17 DIAGNOSIS — C9112 Chronic lymphocytic leukemia of B-cell type in relapse: Secondary | ICD-10-CM

## 2013-05-17 DIAGNOSIS — R42 Dizziness and giddiness: Secondary | ICD-10-CM

## 2013-05-17 DIAGNOSIS — C9192 Lymphoid leukemia, unspecified, in relapse: Secondary | ICD-10-CM

## 2013-05-17 DIAGNOSIS — C50919 Malignant neoplasm of unspecified site of unspecified female breast: Secondary | ICD-10-CM

## 2013-05-17 DIAGNOSIS — D487 Neoplasm of uncertain behavior of other specified sites: Secondary | ICD-10-CM

## 2013-05-17 DIAGNOSIS — I639 Cerebral infarction, unspecified: Secondary | ICD-10-CM

## 2013-05-17 LAB — CBC WITH DIFFERENTIAL/PLATELET
BASO%: 0.4 % (ref 0.0–2.0)
EOS%: 0.9 % (ref 0.0–7.0)
HCT: 31.2 % — ABNORMAL LOW (ref 34.8–46.6)
MCH: 39.4 pg — ABNORMAL HIGH (ref 25.1–34.0)
MCHC: 33.9 g/dL (ref 31.5–36.0)
MONO#: 0.5 10*3/uL (ref 0.1–0.9)
RBC: 2.69 10*6/uL — ABNORMAL LOW (ref 3.70–5.45)
RDW: 16.2 % — ABNORMAL HIGH (ref 11.2–14.5)
WBC: 14.9 10*3/uL — ABNORMAL HIGH (ref 3.9–10.3)
lymph#: 12.8 10*3/uL — ABNORMAL HIGH (ref 0.9–3.3)

## 2013-05-17 LAB — BASIC METABOLIC PANEL (CC13)
CO2: 23 mEq/L (ref 22–29)
Chloride: 111 mEq/L — ABNORMAL HIGH (ref 98–109)
Potassium: 4.1 mEq/L (ref 3.5–5.1)
Sodium: 146 mEq/L — ABNORMAL HIGH (ref 136–145)

## 2013-05-27 ENCOUNTER — Telehealth: Payer: Self-pay | Admitting: Oncology

## 2013-05-27 ENCOUNTER — Other Ambulatory Visit (HOSPITAL_BASED_OUTPATIENT_CLINIC_OR_DEPARTMENT_OTHER): Payer: Medicare Other | Admitting: Lab

## 2013-05-27 ENCOUNTER — Ambulatory Visit (HOSPITAL_BASED_OUTPATIENT_CLINIC_OR_DEPARTMENT_OTHER): Payer: Medicare Other | Admitting: Oncology

## 2013-05-27 VITALS — BP 190/79 | HR 63 | Temp 98.1°F | Resp 20 | Ht 62.0 in | Wt 268.4 lb

## 2013-05-27 DIAGNOSIS — C9192 Lymphoid leukemia, unspecified, in relapse: Secondary | ICD-10-CM

## 2013-05-27 DIAGNOSIS — C9112 Chronic lymphocytic leukemia of B-cell type in relapse: Secondary | ICD-10-CM

## 2013-05-27 DIAGNOSIS — C8589 Other specified types of non-Hodgkin lymphoma, extranodal and solid organ sites: Secondary | ICD-10-CM

## 2013-05-27 DIAGNOSIS — I10 Essential (primary) hypertension: Secondary | ICD-10-CM

## 2013-05-27 DIAGNOSIS — C8590 Non-Hodgkin lymphoma, unspecified, unspecified site: Secondary | ICD-10-CM

## 2013-05-27 DIAGNOSIS — Z170 Estrogen receptor positive status [ER+]: Secondary | ICD-10-CM

## 2013-05-27 DIAGNOSIS — C50912 Malignant neoplasm of unspecified site of left female breast: Secondary | ICD-10-CM

## 2013-05-27 DIAGNOSIS — C50419 Malignant neoplasm of upper-outer quadrant of unspecified female breast: Secondary | ICD-10-CM

## 2013-05-27 LAB — CBC WITH DIFFERENTIAL/PLATELET
Basophils Absolute: 0 10*3/uL (ref 0.0–0.1)
Eosinophils Absolute: 0.1 10*3/uL (ref 0.0–0.5)
HGB: 10.5 g/dL — ABNORMAL LOW (ref 11.6–15.9)
LYMPH%: 89.9 % — ABNORMAL HIGH (ref 14.0–49.7)
MCV: 116.6 fL — ABNORMAL HIGH (ref 79.5–101.0)
MONO#: 0.1 10*3/uL (ref 0.1–0.9)
MONO%: 0.5 % (ref 0.0–14.0)
NEUT#: 1.4 10*3/uL — ABNORMAL LOW (ref 1.5–6.5)
Platelets: 50 10*3/uL — ABNORMAL LOW (ref 145–400)
RBC: 2.61 10*6/uL — ABNORMAL LOW (ref 3.70–5.45)
RDW: 16.4 % — ABNORMAL HIGH (ref 11.2–14.5)
WBC: 15.8 10*3/uL — ABNORMAL HIGH (ref 3.9–10.3)

## 2013-05-27 LAB — COMPREHENSIVE METABOLIC PANEL
ALT: 24 U/L (ref 0–35)
AST: 28 U/L (ref 0–37)
Chloride: 105 mEq/L (ref 96–112)
Creatinine, Ser: 1.26 mg/dL — ABNORMAL HIGH (ref 0.50–1.10)
Sodium: 138 mEq/L (ref 135–145)
Total Bilirubin: 0.5 mg/dL (ref 0.3–1.2)
Total Protein: 6.6 g/dL (ref 6.0–8.3)

## 2013-05-27 LAB — LACTATE DEHYDROGENASE (CC13): LDH: 339 U/L — ABNORMAL HIGH (ref 125–245)

## 2013-05-27 MED ORDER — PROCHLORPERAZINE MALEATE 10 MG PO TABS: 10.0000 mg | ORAL_TABLET | Freq: Four times a day (QID) | ORAL | Status: DC | PRN

## 2013-05-27 NOTE — Telephone Encounter (Signed)
Gave pt appt for for labs , ML and MD up to October 2014

## 2013-05-27 NOTE — Progress Notes (Signed)
Hematology and Oncology Follow Up Visit  [name] [id number] August 06, 1934 79 y.o. 05/27/2013 7:31 PM   Principle Diagnosis: Encounter Diagnoses  Name Primary?  . Low grade malignant lymphoma Yes  . CLL (chronic lymphoid leukemia) in relapse   . Breast cancer, left      Interim History:   Followup visit for this 79 year old woman with well differentiated lymphocytic lymphoma/chronic lymphocytic leukemia and cancer of the left breast in remission. Both malignancies were diagnosed in November 2009 when she presented with rapidly progressive pancytopenia and lymphadenopathy limited to the left axilla. She was found to have a mass in the left breast. However the lymph node showed well-differentiated lymphocytic lymphoma. She failed to respond to initial treatment regimens with FCR and then Campath.  She had an excellent partial response to anti-CD 20 antibody - Arzerra initially started in April 2010   The disease became active again. She was put back on single agent Arzerra 02/24/2012 and again got another nice partial response. She was given reinduction and maintenance through 08/17/2012. Blood counts began to deteriorate again on 04/29/2013 with an abrupt fall of her platelet count from 80,000 in May down to 40,000. Concomitant rise in her white count from 8000 up to 17,000 with 87% lymphocytes. We prescribed the new B. cell receptor signaling inhibitor ibrutinib last month but she never started the drug. She states she will pick it up today. Fortunately there was not much change in her blood counts over the last 3 weeks. Hemoglobin stable at 10.6, white count 15,000 with 86% lymphocytes, and platelets 56,000 on August 19, hemoglobin 10.5, white count 16,000 with 90% lymphocytes, and platelet count 50,000 today. She has no new complaints. She has noticed some burning sensitivity on the left chest wall site of previous mastectomy. We evaluated her in the office 2 weeks ago on August 15 when she  developed a dizzy spell when she was here to get a Port-A-Cath flush. No evidence for any acute cardiopulmonary event. In view of her prior history of TIA I did go ahead and get a MRI of the brain which did not show any acute changes. She has had no subsequent spells.     Medications: reviewed  Allergies:  Allergies  Allergen Reactions  . Latex   . Penicillins     Review of Systems: Constitutional:   No constitutional symptoms HEENT no sore throat Respiratory: No cough or dyspnea Cardiovascular:  No chest pain or palpitations Gastrointestinal: No abdominal pain or change in bowel habit Genito-Urinary: Not questioned Musculoskeletal: No muscle bone or joint pain Neurologic: No headache or change in vision Skin: Chronic dermatitis Vascular: Hematology: Remaining ROS negative.     Physical Exam: Blood pressure 190/79, pulse 63, temperature 98.1 F (36.7 C), temperature source Oral, resp. rate 20, height 5\' 2"  (1.575 m), weight 268 lb 6.4 oz (121.745 kg). Wt Readings from Last 3 Encounters:  05/27/13 268 lb 6.4 oz (121.745 kg)  05/13/13 268 lb (121.564 kg)  05/13/13 266 lb 14.4 oz (121.065 kg)     General appearance: Pleasant, obese, African American woman HENNT: Pharynx no erythema or exudate Lymph nodes: Regrowth of a approximate 3 cm lymph node left axilla. No cervical, supraclavicular, or right axillary adenopathy Breasts: Left mastectomy. No chest wall lesions. No right breast masses Lungs: Clear to auscultation resonant to percussion Heart: Regular rhythm no murmur Abdomen: Soft, obese, nontender, no mass, no organomegaly Extremities: 1+ edema Musculoskeletal: No joint deformities GU: Vascular: No cyanosis Neurologic: Motor strength 5  over 5, reflexes absent symmetric Skin: Chronic, patchy, hyperpigmented, dermatitis. Vitiligo on the face  Lab Results: Lab Results: White count differential : 90% lymphocytes, 9% neutrophils  Component Value Date   WBC 15.8*  05/27/2013   HGB 10.5* 05/27/2013   HCT 30.4* 05/27/2013   MCV 116.6* 05/27/2013   PLT 50* 05/27/2013     Chemistry      Component Value Date/Time   NA 138 05/27/2013 1226   NA 146* 05/17/2013 1247   NA 142 01/01/2011   K 4.2 05/27/2013 1226   K 4.1 05/17/2013 1247   CL 105 05/27/2013 1226   CL 107 02/25/2013 1028   CO2 25 05/27/2013 1226   CO2 23 05/17/2013 1247   BUN 17 05/27/2013 1226   BUN 17.8 05/17/2013 1247   BUN 16 01/01/2011   CREATININE 1.26* 05/27/2013 1226   CREATININE 1.1 05/17/2013 1247   CREATININE 0.9 01/01/2011   GLU 104 01/01/2011      Component Value Date/Time   CALCIUM 9.8 05/27/2013 1226   CALCIUM 9.9 05/17/2013 1247   ALKPHOS 76 05/27/2013 1226   ALKPHOS 78 05/13/2013 0841   AST 28 05/27/2013 1226   AST 25 05/13/2013 0841   ALT 24 05/27/2013 1226   ALT 29 05/13/2013 0841   BILITOT 0.5 05/27/2013 1226   BILITOT 0.50 05/13/2013 0841      Impression: #1. CLL/WDLL-multiply relapsed She will begin a trial of ibrutinib today. Monitor blood counts every 2 weeks until stable. She was advised to watch out for a tumor flare syndrome but given the minimal adenopathy that she has, I doubt we will see this.  #2. Initial noninvasive cancers left breast with subsequent invasive cancer ER positive status post left mastectomy. Initial biopsy at time of diagnosis of her lymphoma in September 2009. Definitive treatment delayed until her blood condition was under control. Subsequent left mastectomy with sentinel lymph node procedure 10/09/2010. Pathology with multifocal invasive cancer up to 2.6 cm with a separate area of DCIS 0.7 cm. Lymph nodes negative for breast cancer. ER/PR strongly positive, HER-2 negative. She was started on hormonal therapy with Aromasin, but was unable to tolerate and was switched to tamoxifen. She continues tamoxifen.  #3. Essential hypertension.  #4. Cerebrovascular disease status post stroke in September 2010.  #5. Chronic nodular dermatitis.  #6. Dizziness/nausea when  here on 05/06/2013. EKG and MRI without obvious etiology of symptoms. The symptoms have resolved.        CC:. Dr. Rene Paci   Levert Feinstein, MD 8/29/20147:31 PM

## 2013-06-02 ENCOUNTER — Telehealth: Payer: Self-pay | Admitting: *Deleted

## 2013-06-02 NOTE — Telephone Encounter (Signed)
Kalema from [facility] called requesting Home Health OT twice a week for 3 weeks.  Please advise

## 2013-06-03 ENCOUNTER — Other Ambulatory Visit (HOSPITAL_BASED_OUTPATIENT_CLINIC_OR_DEPARTMENT_OTHER): Payer: Medicare Other | Admitting: Lab

## 2013-06-03 DIAGNOSIS — C50919 Malignant neoplasm of unspecified site of unspecified female breast: Secondary | ICD-10-CM

## 2013-06-03 DIAGNOSIS — C8589 Other specified types of non-Hodgkin lymphoma, extranodal and solid organ sites: Secondary | ICD-10-CM

## 2013-06-03 DIAGNOSIS — C8590 Non-Hodgkin lymphoma, unspecified, unspecified site: Secondary | ICD-10-CM

## 2013-06-03 DIAGNOSIS — C9192 Lymphoid leukemia, unspecified, in relapse: Secondary | ICD-10-CM

## 2013-06-03 LAB — CBC WITH DIFFERENTIAL/PLATELET
Basophils Absolute: 0.2 10*3/uL — ABNORMAL HIGH (ref 0.0–0.1)
EOS%: 0.3 % (ref 0.0–7.0)
HCT: 31 % — ABNORMAL LOW (ref 34.8–46.6)
HGB: 10.6 g/dL — ABNORMAL LOW (ref 11.6–15.9)
LYMPH%: 76.1 % — ABNORMAL HIGH (ref 14.0–49.7)
MCH: 40.4 pg — ABNORMAL HIGH (ref 25.1–34.0)
MCV: 117.7 fL — ABNORMAL HIGH (ref 79.5–101.0)
MONO%: 2.1 % (ref 0.0–14.0)
NEUT%: 20.4 % — ABNORMAL LOW (ref 38.4–76.8)

## 2013-06-03 LAB — COMPREHENSIVE METABOLIC PANEL (CC13)
Albumin: 4 g/dL (ref 3.5–5.0)
Alkaline Phosphatase: 69 U/L (ref 40–150)
BUN: 27.9 mg/dL — ABNORMAL HIGH (ref 7.0–26.0)
Creatinine: 1.5 mg/dL — ABNORMAL HIGH (ref 0.6–1.1)
Glucose: 128 mg/dl (ref 70–140)
Total Bilirubin: 0.99 mg/dL (ref 0.20–1.20)

## 2013-06-03 NOTE — Telephone Encounter (Signed)
Spoke with Vonda Antigua advised of MDs ok

## 2013-06-03 NOTE — Telephone Encounter (Signed)
Verbal ok. thanks

## 2013-06-09 ENCOUNTER — Telehealth: Payer: Self-pay | Admitting: *Deleted

## 2013-06-09 NOTE — Telephone Encounter (Signed)
PT.'S DAUGHTER CALLED. HER MOTHER IS HAVING NAUSEA BUT NO VOMITING. PT.'S FLUID INTAKE HAS BEEN 24 OUNCES IN THE PAST 24 HOURS. PT. HAS NOT TAKEN HER COMPAZINE MEDICATION. INSTRUCTED PT.'S DAUGHTER HOW HER MOTHER NEEDS TO TAKE HER COMPAZINE. PT. IS COMING FOR LAB TOMORROW. INSTRUCTED PT.'S DAUGHTER TO CALL TOMORROW BEFORE HER MOTHER'S LAB APPOINTMENT IF SHE CONTINUES TO HAVE PROBLEMS. THIS NOTE TO DR.GRANFORTUNA'S NURSE, JAN MYERS,RN.

## 2013-06-10 ENCOUNTER — Ambulatory Visit (HOSPITAL_BASED_OUTPATIENT_CLINIC_OR_DEPARTMENT_OTHER): Payer: Medicare Other

## 2013-06-10 ENCOUNTER — Other Ambulatory Visit: Payer: Self-pay | Admitting: Oncology

## 2013-06-10 ENCOUNTER — Other Ambulatory Visit (HOSPITAL_BASED_OUTPATIENT_CLINIC_OR_DEPARTMENT_OTHER): Payer: Medicare Other | Admitting: Lab

## 2013-06-10 ENCOUNTER — Telehealth: Payer: Self-pay | Admitting: *Deleted

## 2013-06-10 ENCOUNTER — Other Ambulatory Visit: Payer: Self-pay | Admitting: *Deleted

## 2013-06-10 ENCOUNTER — Ambulatory Visit (HOSPITAL_COMMUNITY)
Admission: RE | Admit: 2013-06-10 | Discharge: 2013-06-10 | Disposition: A | Discharge status: Home / Self Care | Payer: Medicare Other | Source: Ambulatory Visit | Attending: Oncology | Admitting: Oncology

## 2013-06-10 ENCOUNTER — Other Ambulatory Visit: Payer: Self-pay | Admitting: Nurse Practitioner

## 2013-06-10 ENCOUNTER — Ambulatory Visit (HOSPITAL_BASED_OUTPATIENT_CLINIC_OR_DEPARTMENT_OTHER): Payer: Medicare Other | Admitting: Nurse Practitioner

## 2013-06-10 VITALS — BP 185/76 | HR 97 | Temp 98.2°F | Resp 20

## 2013-06-10 DIAGNOSIS — D696 Thrombocytopenia, unspecified: Secondary | ICD-10-CM

## 2013-06-10 DIAGNOSIS — C50919 Malignant neoplasm of unspecified site of unspecified female breast: Secondary | ICD-10-CM

## 2013-06-10 DIAGNOSIS — C8589 Other specified types of non-Hodgkin lymphoma, extranodal and solid organ sites: Secondary | ICD-10-CM

## 2013-06-10 DIAGNOSIS — C9112 Chronic lymphocytic leukemia of B-cell type in relapse: Secondary | ICD-10-CM

## 2013-06-10 DIAGNOSIS — C9192 Lymphoid leukemia, unspecified, in relapse: Secondary | ICD-10-CM

## 2013-06-10 DIAGNOSIS — C8590 Non-Hodgkin lymphoma, unspecified, unspecified site: Secondary | ICD-10-CM

## 2013-06-10 DIAGNOSIS — R300 Dysuria: Secondary | ICD-10-CM

## 2013-06-10 LAB — CBC WITH DIFFERENTIAL/PLATELET
Basophils Absolute: 0 10*3/uL (ref 0.0–0.1)
Eosinophils Absolute: 0 10*3/uL (ref 0.0–0.5)
HGB: 8.3 g/dL — ABNORMAL LOW (ref 11.6–15.9)
MONO#: 0.3 10*3/uL (ref 0.1–0.9)
NEUT#: 4.6 10*3/uL (ref 1.5–6.5)
RBC: 2.08 10*6/uL — ABNORMAL LOW (ref 3.70–5.45)
RDW: 16.4 % — ABNORMAL HIGH (ref 11.2–14.5)
WBC: 11.7 10*3/uL — ABNORMAL HIGH (ref 3.9–10.3)
lymph#: 6.8 10*3/uL — ABNORMAL HIGH (ref 0.9–3.3)

## 2013-06-10 LAB — URINALYSIS, MICROSCOPIC - CHCC
Bilirubin (Urine): NEGATIVE
Blood: NEGATIVE
Ketones: NEGATIVE mg/dL
Nitrite: NEGATIVE
Specific Gravity, Urine: 1.01 (ref 1.003–1.035)
pH: 6 (ref 4.6–8.0)

## 2013-06-10 LAB — TECHNOLOGIST REVIEW

## 2013-06-10 MED ORDER — SODIUM CHLORIDE 0.9 % IJ SOLN
10.0000 mL | INTRAMUSCULAR | Status: AC | PRN
  Administered 2013-06-10: 10 mL
  Filled 2013-06-10: qty 10

## 2013-06-10 MED ORDER — SODIUM CHLORIDE 0.9 % IV SOLN
250.0000 mL | Freq: Once | INTRAVENOUS | Status: AC
  Administered 2013-06-10: 250 mL via INTRAVENOUS

## 2013-06-10 MED ORDER — HEPARIN SOD (PORK) LOCK FLUSH 100 UNIT/ML IV SOLN
500.0000 [IU] | Freq: Every day | INTRAVENOUS | Status: AC | PRN
  Administered 2013-06-10: 500 [IU]
  Filled 2013-06-10: qty 5

## 2013-06-10 NOTE — Telephone Encounter (Incomplete)
Called pt and left message regardinjg labs on 9/15

## 2013-06-10 NOTE — Progress Notes (Signed)
OFFICE PROGRESS NOTE  Interval history:  [name] is a 79 year old woman with relapsed well-differentiated lymphocytic lymphoma/chronic lymphocytic leukemia. She also has a history of cancer of the left breast in remission.  She began Ibrutinib on 05/27/2013 for relapse of the CLL/WDLL.  She is seen today for an unscheduled visit for evaluation of nausea, dizziness and extreme fatigue.  Labs from today showed hemoglobin 8.3, white count 11.7, absolute neutrophil count 4.6 and platelet count 3000.  Since beginning the Ibrutinib she has had fairly consistent nausea. No vomiting. She is having approximately 2 loose stools a day. Energy level is markedly diminished as is her appetite. She denies any unusual headaches. Vision is "blurry" intermittently. No diplopia. She denies mouth sores. She had a fever 5 or 6 days ago. None since. No shaking chills. She has noted some pain with urination. No hematuria. She has stable chronic dyspnea. No chest pain.   Objective: Temperature 98.2, heart rate 97, respirations 20, blood pressure 185/76.  Extraocular movements intact. Oropharynx without thrush or ulceration. Lungs clear. No wheezes or rales. Regular cardiac rhythm. Port-A-Cath site without erythema. Abdomen soft and nontender. Trace to 1+ pretibial edema bilaterally. Motor strength 5 over 5. Finger to nose intact. She is alert and oriented. Follows commands.  Lab Results: Lab Results  Component Value Date   WBC 11.7* 06/10/2013   HGB 8.3* 06/10/2013   HCT 24.8* 06/10/2013   MCV 119.3* 06/10/2013   PLT 3* 06/10/2013    Chemistry:    Chemistry      Component Value Date/Time   NA 140 06/03/2013 1318   NA 138 05/27/2013 1226   NA 142 01/01/2011   K 4.2 06/03/2013 1318   K 4.2 05/27/2013 1226   CL 105 05/27/2013 1226   CL 107 02/25/2013 1028   CO2 23 06/03/2013 1318   CO2 25 05/27/2013 1226   BUN 27.9* 06/03/2013 1318   BUN 17 05/27/2013 1226   BUN 16 01/01/2011   CREATININE 1.5* 06/03/2013 1318   CREATININE 1.26* 05/27/2013 1226   CREATININE 0.9 01/01/2011   GLU 104 01/01/2011      Component Value Date/Time   CALCIUM 9.6 06/03/2013 1318   CALCIUM 9.8 05/27/2013 1226   ALKPHOS 69 06/03/2013 1318   ALKPHOS 76 05/27/2013 1226   AST 20 06/03/2013 1318   AST 28 05/27/2013 1226   ALT 18 06/03/2013 1318   ALT 24 05/27/2013 1226   BILITOT 0.99 06/03/2013 1318   BILITOT 0.5 05/27/2013 1226       Studies/Results: No results found.  Medications: I have reviewed the patient's current medications.  Assessment/Plan:  1. Relapsed CLL/well-differentiated lymphocytic lymphoma. She began a trial of Ibrutinib 05/27/2013. 2. Severe thrombocytopenia secondary to #1 and Ibrutinib. 3. Low grade nausea likely secondary to Ibrutinib. 4. Extreme fatigue, anorexia since beginning Ibrutinib.   Disposition-[name] has developed nausea, anorexia and extreme fatigue. She has severe thrombocytopenia. We are placing the Ibrutinib on hold. She will receive a unit of platelets today. She will return tomorrow morning for a repeat CBC with plans to transfuse a unit of platelets if the count returns at less than 10,000. She understands to seek emergency evaluation with any signs of bleeding.  Plan reviewed with Dr. Cyndie Chime with his agreement.  Lonna Cobb ANP/GNP-BC

## 2013-06-10 NOTE — Telephone Encounter (Signed)
Reporting she is en route for her scheduled lab work today and wants to be seen by nurse or speak with physician. Continued nausea (no vomiting), dizziness and extreme fatigue/weakness. Wishes to discuss dose reduction of the ibrutinib. She will have labs checked and wait in lobby.

## 2013-06-10 NOTE — Patient Instructions (Signed)
Platelet Transfusion Information This is information about transfusions of platelets. Platelets are tiny cells made by the bone marrow and found in the blood. When a blood vessel is damaged platelets rush to the damaged area to help form a clot. This begins the healing process. When platelets get very low your blood may have trouble clotting. This may be from:  Illness.  Blood disorder.  Chemotherapy to treat cancer. Often lower platelet counts do not usually cause problems.  Platelets usually last for 7 to 10 days. If they are not used not used in an injury, they are broken down by the liver or spleen. Symptoms of low platelet count include:  Nosebleeds.  Bleeding gums.  Heavy periods.  Bruising and tiny blood spots in the skin.  Pin point spots of bleeding are called (petechiae).  Larger bruises (purpura).  Bleeding can be more serious if it happens in the brain or bowel. Platelet transfusions are often used to keep the platelet count at an acceptable level. Serious bleeding due to low platelets is uncommon. RISKS AND COMPLICATIONS Severe side effects from platelet transfusions are uncommon. Minor reactions may include:  Itching.  Rashes.  High temperature and shivering. Medications are available to stop transfusion reactions. Let your caregivers know if you develop any of the above problems.  If you are having platelet transfusions frequently they may get less effective. This is called becoming refractory to platelets. It is uncommon. This can happen from non-immune causes and immune causes. Non-immune causes include:  High temperatures.  Some medications.  An enlarged spleen. Immune causes happen when your body discovers the platelets are not your own and begin making antibodies against them. The antibodies kill the platelets quickly. Even with platelet transfusions you may still notice problems with bleeding or bruising. Let your caregivers know about this. Other things  can be done to help if this happens.  BEFORE THE PROCEDURE   Your doctors will check your platelet count regularly.  If the platelet count is too low it may be necessary to have a platelet transfusion.  This is more important before certain procedures with a risk of bleeding such as a spinal tap.  Platelet transfusion reduces the risk of bleeding during or after the procedure.  Except in emergencies, giving a transfusion requires a written consent. Before blood is taken from a donor, a complete history is taken to make sure the person has no history of previous diseases, nor engages in risky social behavior. Examples of this are intravenous drug use or sexual activity with multiple partners. This could lead to infected blood or blood products being used. This history is done even in spite of the extensive testing to make sure the blood is safe. All blood products transfused are tested to make sure it is a match for the person getting the blood. It is also checked for infections. Blood is the safest it has ever been. The risk of getting an infection is very low. PROCEDURE  The platelets are stored in small plastic bags which are kept at a low temperature.  Each bag is called a unit and sometimes two units are given. They are given through an intravenous line by drip infusion over about one half hour.  Usually blood is collected from multiple people to get enough to transfuse.  Sometimes, the platelets are collected from a single person. This is done using a special machine that separates the platelets from the blood. The machine is called an apheresis machine. Platelets collected   in this way are called apheresed platelets. Apheresed platelets reduce the risk of becoming sensitive to the platelets. This lowers the chances of having a transfusion reaction.  As it only takes a short time to give the platelets, this treatment can be given in an outpatients department. Platelets can also be given  before or after other treatments. SEEK IMMEDIATE MEDICAL CARE IF: Any of the following symptoms over the next 12 hours or several days:  Shaking chills.  Fever with a temperature greater than 102 F (38.9 C) develops.  Back pain or muscle pain.  People around you feel you are not acting correctly, or you are confused.  Blood in the urine or bowel movements or bleeding from any place in your body.  Shortness of breath, or difficulty breathing.  Dizziness.  Fainting.  You break out in a rash or develop hives.  You have a decrease in the amount of urine you are putting out, or the urine turns a dark color or changes to pink, red, or brown.  A severe headache or stiff neck.  Bruising more easily. Document Released: 07/13/2007 Document Revised: 12/08/2011 Document Reviewed: 07/13/2007 ExitCare Patient Information 2014 [employer].  

## 2013-06-11 ENCOUNTER — Ambulatory Visit (HOSPITAL_BASED_OUTPATIENT_CLINIC_OR_DEPARTMENT_OTHER): Payer: Medicare Other

## 2013-06-11 ENCOUNTER — Other Ambulatory Visit: Payer: Self-pay | Admitting: Oncology

## 2013-06-11 ENCOUNTER — Other Ambulatory Visit: Payer: Self-pay

## 2013-06-11 VITALS — BP 135/44 | HR 71 | Temp 97.1°F | Resp 18

## 2013-06-11 DIAGNOSIS — D539 Nutritional anemia, unspecified: Secondary | ICD-10-CM

## 2013-06-11 DIAGNOSIS — C9192 Lymphoid leukemia, unspecified, in relapse: Secondary | ICD-10-CM

## 2013-06-11 DIAGNOSIS — D696 Thrombocytopenia, unspecified: Secondary | ICD-10-CM

## 2013-06-11 DIAGNOSIS — C50919 Malignant neoplasm of unspecified site of unspecified female breast: Secondary | ICD-10-CM

## 2013-06-11 LAB — CBC WITH DIFFERENTIAL/PLATELET
Eosinophils Relative: 0 % (ref 0–5)
Lymphocytes Relative: 58 % — ABNORMAL HIGH (ref 12–46)
Monocytes Relative: 3 % (ref 3–12)
Neutrophils Relative %: 39 % — ABNORMAL LOW (ref 43–77)
Platelets: <5 10*3/uL — CL (ref 150–400)
RBC: 1.95 MIL/uL — ABNORMAL LOW (ref 3.87–5.11)
WBC: 10.6 10*3/uL — ABNORMAL HIGH (ref 4.0–10.5)

## 2013-06-11 LAB — PREPARE PLATELET PHERESIS: Unit division: 0

## 2013-06-11 MED ORDER — SODIUM CHLORIDE 0.9 % IV SOLN
250.0000 mL | Freq: Once | INTRAVENOUS | Status: AC
  Administered 2013-06-11: 250 mL via INTRAVENOUS

## 2013-06-11 MED ORDER — SODIUM CHLORIDE 0.9 % IJ SOLN
3.0000 mL | INTRAMUSCULAR | Status: AC | PRN
  Administered 2013-06-11: 3 mL
  Filled 2013-06-11: qty 10

## 2013-06-11 MED ORDER — HEPARIN SOD (PORK) LOCK FLUSH 100 UNIT/ML IV SOLN
250.0000 [IU] | INTRAVENOUS | Status: AC | PRN
  Administered 2013-06-11: 250 [IU]
  Filled 2013-06-11: qty 5

## 2013-06-11 NOTE — Progress Notes (Signed)
Call from Lab, Pt plt count <5. Currently no platelets available at [facility], call placed to [facility] to check availability and will call back with information. Call from lab, platelets coming from [facility].

## 2013-06-12 ENCOUNTER — Other Ambulatory Visit: Payer: Self-pay | Admitting: Internal Medicine

## 2013-06-13 ENCOUNTER — Other Ambulatory Visit: Payer: Self-pay | Admitting: Oncology

## 2013-06-13 ENCOUNTER — Ambulatory Visit (HOSPITAL_BASED_OUTPATIENT_CLINIC_OR_DEPARTMENT_OTHER): Payer: Medicare Other

## 2013-06-13 ENCOUNTER — Other Ambulatory Visit: Payer: Self-pay | Admitting: *Deleted

## 2013-06-13 ENCOUNTER — Other Ambulatory Visit (HOSPITAL_BASED_OUTPATIENT_CLINIC_OR_DEPARTMENT_OTHER): Payer: Medicare Other | Admitting: Lab

## 2013-06-13 VITALS — BP 130/67 | HR 66 | Temp 98.9°F | Resp 18

## 2013-06-13 DIAGNOSIS — C9192 Lymphoid leukemia, unspecified, in relapse: Secondary | ICD-10-CM

## 2013-06-13 DIAGNOSIS — D696 Thrombocytopenia, unspecified: Secondary | ICD-10-CM

## 2013-06-13 DIAGNOSIS — C50919 Malignant neoplasm of unspecified site of unspecified female breast: Secondary | ICD-10-CM

## 2013-06-13 DIAGNOSIS — C8590 Non-Hodgkin lymphoma, unspecified, unspecified site: Secondary | ICD-10-CM

## 2013-06-13 LAB — CBC WITH DIFFERENTIAL/PLATELET
Eosinophils Absolute: 0 10*3/uL (ref 0.0–0.5)
HCT: 23.7 % — ABNORMAL LOW (ref 34.8–46.6)
LYMPH%: 73.3 % — ABNORMAL HIGH (ref 14.0–49.7)
MONO#: 0.3 10*3/uL (ref 0.1–0.9)
NEUT#: 2.5 10*3/uL (ref 1.5–6.5)
Platelets: 3 10*3/uL — CL (ref 145–400)
RBC: 1.97 10*6/uL — ABNORMAL LOW (ref 3.70–5.45)
WBC: 10.8 10*3/uL — ABNORMAL HIGH (ref 3.9–10.3)

## 2013-06-13 LAB — PREPARE PLATELET PHERESIS: Unit division: 0

## 2013-06-13 LAB — TECHNOLOGIST REVIEW

## 2013-06-13 MED ORDER — SODIUM CHLORIDE 0.9 % IV SOLN
250.0000 mL | Freq: Once | INTRAVENOUS | Status: AC
  Administered 2013-06-13: 250 mL via INTRAVENOUS

## 2013-06-13 MED ORDER — HEPARIN SOD (PORK) LOCK FLUSH 100 UNIT/ML IV SOLN
500.0000 [IU] | Freq: Every day | INTRAVENOUS | Status: AC | PRN
  Administered 2013-06-13: 500 [IU]
  Filled 2013-06-13: qty 5

## 2013-06-13 MED ORDER — AMINOCAPROIC ACID 500 MG PO TABS: 2.0000 g | ORAL_TABLET | Freq: Four times a day (QID) | ORAL | Status: DC

## 2013-06-13 MED ORDER — SODIUM CHLORIDE 0.9 % IJ SOLN
10.0000 mL | INTRAMUSCULAR | Status: AC | PRN
  Administered 2013-06-13: 10 mL
  Filled 2013-06-13: qty 10

## 2013-06-13 NOTE — Patient Instructions (Signed)
Platelet Transfusion Information This is information about transfusions of platelets. Platelets are tiny cells made by the bone marrow and found in the blood. When a blood vessel is damaged platelets rush to the damaged area to help form a clot. This begins the healing process. When platelets get very low your blood may have trouble clotting. This may be from:  Illness.  Blood disorder.  Chemotherapy to treat cancer. Often lower platelet counts do not usually cause problems.  Platelets usually last for 7 to 10 days. If they are not used not used in an injury, they are broken down by the liver or spleen. Symptoms of low platelet count include:  Nosebleeds.  Bleeding gums.  Heavy periods.  Bruising and tiny blood spots in the skin.  Pin point spots of bleeding are called (petechiae).  Larger bruises (purpura).  Bleeding can be more serious if it happens in the brain or bowel. Platelet transfusions are often used to keep the platelet count at an acceptable level. Serious bleeding due to low platelets is uncommon. RISKS AND COMPLICATIONS Severe side effects from platelet transfusions are uncommon. Minor reactions may include:  Itching.  Rashes.  High temperature and shivering. Medications are available to stop transfusion reactions. Let your caregivers know if you develop any of the above problems.  If you are having platelet transfusions frequently they may get less effective. This is called becoming refractory to platelets. It is uncommon. This can happen from non-immune causes and immune causes. Non-immune causes include:  High temperatures.  Some medications.  An enlarged spleen. Immune causes happen when your body discovers the platelets are not your own and begin making antibodies against them. The antibodies kill the platelets quickly. Even with platelet transfusions you may still notice problems with bleeding or bruising. Let your caregivers know about this. Other things  can be done to help if this happens.  BEFORE THE PROCEDURE   Your doctors will check your platelet count regularly.  If the platelet count is too low it may be necessary to have a platelet transfusion.  This is more important before certain procedures with a risk of bleeding such as a spinal tap.  Platelet transfusion reduces the risk of bleeding during or after the procedure.  Except in emergencies, giving a transfusion requires a written consent. Before blood is taken from a donor, a complete history is taken to make sure the person has no history of previous diseases, nor engages in risky social behavior. Examples of this are intravenous drug use or sexual activity with multiple partners. This could lead to infected blood or blood products being used. This history is done even in spite of the extensive testing to make sure the blood is safe. All blood products transfused are tested to make sure it is a match for the person getting the blood. It is also checked for infections. Blood is the safest it has ever been. The risk of getting an infection is very low. PROCEDURE  The platelets are stored in small plastic bags which are kept at a low temperature.  Each bag is called a unit and sometimes two units are given. They are given through an intravenous line by drip infusion over about one half hour.  Usually blood is collected from multiple people to get enough to transfuse.  Sometimes, the platelets are collected from a single person. This is done using a special machine that separates the platelets from the blood. The machine is called an apheresis machine. Platelets collected   in this way are called apheresed platelets. Apheresed platelets reduce the risk of becoming sensitive to the platelets. This lowers the chances of having a transfusion reaction.  As it only takes a short time to give the platelets, this treatment can be given in an outpatients department. Platelets can also be given  before or after other treatments. SEEK IMMEDIATE MEDICAL CARE IF: Any of the following symptoms over the next 12 hours or several days:  Shaking chills.  Fever with a temperature greater than 102 F (38.9 C) develops.  Back pain or muscle pain.  People around you feel you are not acting correctly, or you are confused.  Blood in the urine or bowel movements or bleeding from any place in your body.  Shortness of breath, or difficulty breathing.  Dizziness.  Fainting.  You break out in a rash or develop hives.  You have a decrease in the amount of urine you are putting out, or the urine turns a dark color or changes to pink, red, or brown.  A severe headache or stiff neck.  Bruising more easily. Document Released: 07/13/2007 Document Revised: 12/08/2011 Document Reviewed: 07/13/2007 ExitCare Patient Information 2014 [employer].  

## 2013-06-14 ENCOUNTER — Telehealth: Payer: Self-pay | Admitting: Oncology

## 2013-06-14 ENCOUNTER — Other Ambulatory Visit: Payer: Self-pay | Admitting: *Deleted

## 2013-06-14 ENCOUNTER — Telehealth: Payer: Self-pay | Admitting: *Deleted

## 2013-06-14 DIAGNOSIS — D696 Thrombocytopenia, unspecified: Secondary | ICD-10-CM

## 2013-06-14 LAB — PREPARE PLATELET PHERESIS

## 2013-06-14 MED ORDER — AMINOCAPROIC ACID 500 MG PO TABS: 2.0000 g | ORAL_TABLET | Freq: Four times a day (QID) | ORAL | Status: DC

## 2013-06-14 NOTE — Telephone Encounter (Signed)
S/w pt dtr re appt for 9/17 lb/JG @ 2:30pm.

## 2013-06-14 NOTE — Telephone Encounter (Signed)
Pt daughter reports that pt does not want to take anything else until she talks with Dr Cyndie Chime.  She also reports that she thinks the amicar made her sick in the past.  Pt will be seen tomorrow to discuss with Dr Cyndie Chime.

## 2013-06-14 NOTE — Telephone Encounter (Signed)
Done hardcopy to robin  

## 2013-06-14 NOTE — Telephone Encounter (Signed)
Left msg on vm stating pt is requesting for a Rollator Walker. On script please state including hand breaks & seat. Will be using medical supply store in [facility]. Please fax to [phone].Marland KitchenMarland KitchenMarland KitchenMarland Kitchenlmb

## 2013-06-15 ENCOUNTER — Ambulatory Visit (HOSPITAL_BASED_OUTPATIENT_CLINIC_OR_DEPARTMENT_OTHER): Payer: Medicare Other

## 2013-06-15 ENCOUNTER — Telehealth: Payer: Self-pay | Admitting: *Deleted

## 2013-06-15 ENCOUNTER — Encounter: Payer: Self-pay | Admitting: Oncology

## 2013-06-15 ENCOUNTER — Ambulatory Visit (HOSPITAL_BASED_OUTPATIENT_CLINIC_OR_DEPARTMENT_OTHER): Payer: Medicare Other | Admitting: Lab

## 2013-06-15 ENCOUNTER — Other Ambulatory Visit: Payer: Self-pay | Admitting: *Deleted

## 2013-06-15 ENCOUNTER — Ambulatory Visit (HOSPITAL_BASED_OUTPATIENT_CLINIC_OR_DEPARTMENT_OTHER): Payer: Medicare Other | Admitting: Oncology

## 2013-06-15 VITALS — BP 161/73 | HR 77 | Temp 98.1°F | Resp 16

## 2013-06-15 VITALS — BP 179/73 | HR 75 | Temp 98.2°F | Resp 20 | Ht 62.0 in | Wt 260.5 lb

## 2013-06-15 DIAGNOSIS — C9192 Lymphoid leukemia, unspecified, in relapse: Secondary | ICD-10-CM

## 2013-06-15 DIAGNOSIS — C9112 Chronic lymphocytic leukemia of B-cell type in relapse: Secondary | ICD-10-CM

## 2013-06-15 DIAGNOSIS — D696 Thrombocytopenia, unspecified: Secondary | ICD-10-CM

## 2013-06-15 DIAGNOSIS — C50919 Malignant neoplasm of unspecified site of unspecified female breast: Secondary | ICD-10-CM

## 2013-06-15 DIAGNOSIS — D539 Nutritional anemia, unspecified: Secondary | ICD-10-CM

## 2013-06-15 DIAGNOSIS — C9110 Chronic lymphocytic leukemia of B-cell type not having achieved remission: Secondary | ICD-10-CM | POA: Insufficient documentation

## 2013-06-15 HISTORY — DX: Thrombocytopenia, unspecified: D69.6

## 2013-06-15 LAB — URIC ACID (CC13): Uric Acid, Serum: 8.1 mg/dl — ABNORMAL HIGH (ref 2.6–7.4)

## 2013-06-15 LAB — COMPREHENSIVE METABOLIC PANEL (CC13)
CO2: 25 mEq/L (ref 22–29)
Creatinine: 1.2 mg/dL — ABNORMAL HIGH (ref 0.6–1.1)
Glucose: 115 mg/dl (ref 70–140)
Sodium: 139 mEq/L (ref 136–145)
Total Bilirubin: 0.54 mg/dL (ref 0.20–1.20)
Total Protein: 6.6 g/dL (ref 6.4–8.3)

## 2013-06-15 LAB — CBC WITH DIFFERENTIAL/PLATELET
Basophils Absolute: 0 10*3/uL (ref 0.0–0.1)
Eosinophils Absolute: 0 10*3/uL (ref 0.0–0.5)
HCT: 21.7 % — ABNORMAL LOW (ref 34.8–46.6)
HGB: 7.3 g/dL — ABNORMAL LOW (ref 11.6–15.9)
LYMPH%: 77.8 % — ABNORMAL HIGH (ref 14.0–49.7)
MCV: 113.6 fL — ABNORMAL HIGH (ref 79.5–101.0)
MONO%: 5.1 % (ref 0.0–14.0)
NEUT#: 1.4 10*3/uL — ABNORMAL LOW (ref 1.5–6.5)
Platelets: 4 10*3/uL — CL (ref 145–400)

## 2013-06-15 LAB — PREPARE RBC (CROSSMATCH)

## 2013-06-15 LAB — LACTATE DEHYDROGENASE (CC13): LDH: 304 U/L — ABNORMAL HIGH (ref 125–245)

## 2013-06-15 MED ORDER — HEPARIN SOD (PORK) LOCK FLUSH 100 UNIT/ML IV SOLN
500.0000 [IU] | Freq: Every day | INTRAVENOUS | Status: AC | PRN
  Administered 2013-06-15: 500 [IU]
  Filled 2013-06-15: qty 5

## 2013-06-15 MED ORDER — SODIUM CHLORIDE 0.9 % IJ SOLN
10.0000 mL | INTRAMUSCULAR | Status: AC | PRN
  Administered 2013-06-15: 10 mL
  Filled 2013-06-15: qty 10

## 2013-06-15 MED ORDER — SODIUM CHLORIDE 0.9 % IV SOLN
250.0000 mL | Freq: Once | INTRAVENOUS | Status: AC
  Administered 2013-06-15: 250 mL via INTRAVENOUS

## 2013-06-15 MED ORDER — ALLOPURINOL 300 MG PO TABS: 300.0000 mg | ORAL_TABLET | Freq: Every day | ORAL | Status: DC

## 2013-06-15 NOTE — Patient Instructions (Addendum)
Platelet Transfusion Information This is information about transfusions of platelets. Platelets are tiny cells made by the bone marrow and found in the blood. When a blood vessel is damaged platelets rush to the damaged area to help form a clot. This begins the healing process. When platelets get very low your blood may have trouble clotting. This may be from:  Illness.  Blood disorder.  Chemotherapy to treat cancer. Often lower platelet counts do not usually cause problems.  Platelets usually last for 7 to 10 days. If they are not used not used in an injury, they are broken down by the liver or spleen. Symptoms of low platelet count include:  Nosebleeds.  Bleeding gums.  Heavy periods.  Bruising and tiny blood spots in the skin.  Pin point spots of bleeding are called (petechiae).  Larger bruises (purpura).  Bleeding can be more serious if it happens in the brain or bowel. Platelet transfusions are often used to keep the platelet count at an acceptable level. Serious bleeding due to low platelets is uncommon. RISKS AND COMPLICATIONS Severe side effects from platelet transfusions are uncommon. Minor reactions may include:  Itching.  Rashes.  High temperature and shivering. Medications are available to stop transfusion reactions. Let your caregivers know if you develop any of the above problems.  If you are having platelet transfusions frequently they may get less effective. This is called becoming refractory to platelets. It is uncommon. This can happen from non-immune causes and immune causes. Non-immune causes include:  High temperatures.  Some medications.  An enlarged spleen. Immune causes happen when your body discovers the platelets are not your own and begin making antibodies against them. The antibodies kill the platelets quickly. Even with platelet transfusions you may still notice problems with bleeding or bruising. Let your caregivers know about this. Other things  can be done to help if this happens.  BEFORE THE PROCEDURE   Your doctors will check your platelet count regularly.  If the platelet count is too low it may be necessary to have a platelet transfusion.  This is more important before certain procedures with a risk of bleeding such as a spinal tap.  Platelet transfusion reduces the risk of bleeding during or after the procedure.  Except in emergencies, giving a transfusion requires a written consent. Before blood is taken from a donor, a complete history is taken to make sure the person has no history of previous diseases, nor engages in risky social behavior. Examples of this are intravenous drug use or sexual activity with multiple partners. This could lead to infected blood or blood products being used. This history is done even in spite of the extensive testing to make sure the blood is safe. All blood products transfused are tested to make sure it is a match for the person getting the blood. It is also checked for infections. Blood is the safest it has ever been. The risk of getting an infection is very low. PROCEDURE  The platelets are stored in small plastic bags which are kept at a low temperature.  Each bag is called a unit and sometimes two units are given. They are given through an intravenous line by drip infusion over about one half hour.  Usually blood is collected from multiple people to get enough to transfuse.  Sometimes, the platelets are collected from a single person. This is done using a special machine that separates the platelets from the blood. The machine is called an apheresis machine. Platelets collected   in this way are called apheresed platelets. Apheresed platelets reduce the risk of becoming sensitive to the platelets. This lowers the chances of having a transfusion reaction.  As it only takes a short time to give the platelets, this treatment can be given in an outpatients department. Platelets can also be given  before or after other treatments. SEEK IMMEDIATE MEDICAL CARE IF: Any of the following symptoms over the next 12 hours or several days:  Shaking chills.  Fever with a temperature greater than 102 F (38.9 C) develops.  Back pain or muscle pain.  People around you feel you are not acting correctly, or you are confused.  Blood in the urine or bowel movements or bleeding from any place in your body.  Shortness of breath, or difficulty breathing.  Dizziness.  Fainting.  You break out in a rash or develop hives.  You have a decrease in the amount of urine you are putting out, or the urine turns a dark color or changes to pink, red, or brown.  A severe headache or stiff neck.  Bruising more easily. Document Released: 07/13/2007 Document Revised: 12/08/2011 Document Reviewed: 07/13/2007 ExitCare Patient Information 2014 [employer].  

## 2013-06-15 NOTE — Telephone Encounter (Signed)
Spoke with Danielle[facility] after requesting HLA typing on pt & informed that she is a hard match & if they can find a donor, it would be at least Saturday before HLA platelets would be available.  She states that these platelets should be good for at least 5-7 days & the [employer] is actively working on finding a donor.

## 2013-06-15 NOTE — Telephone Encounter (Signed)
Called Joe no answer LMOM script was fax over to fax # that was given below...Raechel Chute

## 2013-06-16 ENCOUNTER — Ambulatory Visit (HOSPITAL_BASED_OUTPATIENT_CLINIC_OR_DEPARTMENT_OTHER): Payer: Medicare Other

## 2013-06-16 VITALS — BP 142/60 | HR 61 | Temp 98.0°F | Resp 20

## 2013-06-16 DIAGNOSIS — D696 Thrombocytopenia, unspecified: Secondary | ICD-10-CM

## 2013-06-16 DIAGNOSIS — C9192 Lymphoid leukemia, unspecified, in relapse: Secondary | ICD-10-CM

## 2013-06-16 DIAGNOSIS — C9112 Chronic lymphocytic leukemia of B-cell type in relapse: Secondary | ICD-10-CM

## 2013-06-16 LAB — PREPARE PLATELET PHERESIS

## 2013-06-16 MED ORDER — SODIUM CHLORIDE 0.9 % IJ SOLN
3.0000 mL | INTRAMUSCULAR | Status: AC | PRN
  Administered 2013-06-16: 3 mL
  Filled 2013-06-16: qty 10

## 2013-06-16 MED ORDER — HEPARIN SOD (PORK) LOCK FLUSH 100 UNIT/ML IV SOLN
250.0000 [IU] | INTRAVENOUS | Status: AC | PRN
  Administered 2013-06-16: 250 [IU]
  Filled 2013-06-16: qty 5

## 2013-06-16 MED ORDER — SODIUM CHLORIDE 0.9 % IV SOLN
250.0000 mL | Freq: Once | INTRAVENOUS | Status: AC
  Administered 2013-06-16: 250 mL via INTRAVENOUS

## 2013-06-16 NOTE — Telephone Encounter (Signed)
[facility] called back and would like script mailed to them as fax was too dark to read.  Please redo script as was faxed on Monday and want to scan.   Once MD does script will then mail to [facility], [address], [place] [address].  AttnElita Quick.

## 2013-06-16 NOTE — Patient Instructions (Addendum)
Blood Transfusion Information WHAT IS A BLOOD TRANSFUSION? A transfusion is the replacement of blood or some of its parts. Blood is made up of multiple cells which provide different functions.  Red blood cells carry oxygen and are used for blood loss replacement.  White blood cells fight against infection.  Platelets control bleeding.  Plasma helps clot blood.  Other blood products are available for specialized needs, such as hemophilia or other clotting disorders. BEFORE THE TRANSFUSION  Who gives blood for transfusions?   You may be able to donate blood to be used at a later date on yourself (autologous donation).  Relatives can be asked to donate blood. This is generally not any safer than if you have received blood from a stranger. The same precautions are taken to ensure safety when a relative's blood is donated.  Healthy volunteers who are fully evaluated to make sure their blood is safe. This is blood bank blood. Transfusion therapy is the safest it has ever been in the practice of medicine. Before blood is taken from a donor, a complete history is taken to make sure that person has no history of diseases nor engages in risky social behavior (examples are intravenous drug use or sexual activity with multiple partners). The donor's travel history is screened to minimize risk of transmitting infections, such as malaria. The donated blood is tested for signs of infectious diseases, such as HIV and hepatitis. The blood is then tested to be sure it is compatible with you in order to minimize the chance of a transfusion reaction. If you or a relative donates blood, this is often done in anticipation of surgery and is not appropriate for emergency situations. It takes many days to process the donated blood. RISKS AND COMPLICATIONS Although transfusion therapy is very safe and saves many lives, the main dangers of transfusion include:   Getting an infectious disease.  Developing a  transfusion reaction. This is an allergic reaction to something in the blood you were given. Every precaution is taken to prevent this. The decision to have a blood transfusion has been considered carefully by your caregiver before blood is given. Blood is not given unless the benefits outweigh the risks. AFTER THE TRANSFUSION  Right after receiving a blood transfusion, you will usually feel much better and more energetic. This is especially true if your red blood cells have gotten low (anemic). The transfusion raises the level of the red blood cells which carry oxygen, and this usually causes an energy increase.  The nurse administering the transfusion will monitor you carefully for complications. HOME CARE INSTRUCTIONS  No special instructions are needed after a transfusion. You may find your energy is better. Speak with your caregiver about any limitations on activity for underlying diseases you may have. SEEK MEDICAL CARE IF:   Your condition is not improving after your transfusion.  You develop redness or irritation at the intravenous (IV) site. SEEK IMMEDIATE MEDICAL CARE IF:  Any of the following symptoms occur over the next 12 hours:  Shaking chills.  You have a temperature by mouth above 102 F (38.9 C), not controlled by medicine.  Chest, back, or muscle pain.  People around you feel you are not acting correctly or are confused.  Shortness of breath or difficulty breathing.  Dizziness and fainting.  You get a rash or develop hives.  You have a decrease in urine output.  Your urine turns a dark color or changes to pink, red, or brown. Any of the following   symptoms occur over the next 10 days:  You have a temperature by mouth above 102 F (38.9 C), not controlled by medicine.  Shortness of breath.  Weakness after normal activity.  The white part of the eye turns yellow (jaundice).  You have a decrease in the amount of urine or are urinating less often.  Your  urine turns a dark color or changes to pink, red, or brown. Document Released: 09/12/2000 Document Revised: 12/08/2011 Document Reviewed: 05/01/2008 Exit[employer] Patient Information 2014 [employer].  

## 2013-06-17 ENCOUNTER — Telehealth: Payer: Self-pay | Admitting: *Deleted

## 2013-06-17 ENCOUNTER — Other Ambulatory Visit (HOSPITAL_BASED_OUTPATIENT_CLINIC_OR_DEPARTMENT_OTHER): Payer: Medicare Other | Admitting: Lab

## 2013-06-17 ENCOUNTER — Encounter: Payer: Self-pay | Admitting: Oncology

## 2013-06-17 ENCOUNTER — Ambulatory Visit (HOSPITAL_BASED_OUTPATIENT_CLINIC_OR_DEPARTMENT_OTHER): Payer: Medicare Other

## 2013-06-17 ENCOUNTER — Ambulatory Visit: Payer: Medicare Other | Admitting: Lab

## 2013-06-17 ENCOUNTER — Other Ambulatory Visit: Payer: Self-pay | Admitting: *Deleted

## 2013-06-17 VITALS — BP 183/59 | HR 61 | Temp 98.4°F | Resp 20

## 2013-06-17 DIAGNOSIS — D696 Thrombocytopenia, unspecified: Secondary | ICD-10-CM

## 2013-06-17 DIAGNOSIS — C9192 Lymphoid leukemia, unspecified, in relapse: Secondary | ICD-10-CM

## 2013-06-17 DIAGNOSIS — C9112 Chronic lymphocytic leukemia of B-cell type in relapse: Secondary | ICD-10-CM

## 2013-06-17 LAB — TYPE AND SCREEN
ABO/RH(D): A POS
Antibody Screen: NEGATIVE
Unit division: 0

## 2013-06-17 LAB — CBC WITH DIFFERENTIAL/PLATELET
Eosinophils Absolute: 0.1 10*3/uL (ref 0.0–0.5)
LYMPH%: 76.9 % — ABNORMAL HIGH (ref 14.0–49.7)
MONO#: 0.2 10*3/uL (ref 0.1–0.9)
NEUT#: 1.5 10*3/uL (ref 1.5–6.5)
Platelets: 6 10*3/uL — CL (ref 145–400)
RBC: 2.48 10*6/uL — ABNORMAL LOW (ref 3.70–5.45)
WBC: 7.7 10*3/uL (ref 3.9–10.3)
nRBC: 0 % (ref 0–0)

## 2013-06-17 MED ORDER — HEPARIN SOD (PORK) LOCK FLUSH 100 UNIT/ML IV SOLN
500.0000 [IU] | Freq: Every day | INTRAVENOUS | Status: AC | PRN
  Administered 2013-06-17: 500 [IU]
  Filled 2013-06-17: qty 5

## 2013-06-17 MED ORDER — SODIUM CHLORIDE 0.9 % IJ SOLN
10.0000 mL | INTRAMUSCULAR | Status: AC | PRN
  Administered 2013-06-17: 10 mL
  Filled 2013-06-17: qty 10

## 2013-06-17 MED ORDER — SODIUM CHLORIDE 0.9 % IV SOLN
250.0000 mL | Freq: Once | INTRAVENOUS | Status: AC
  Administered 2013-06-17: 250 mL via INTRAVENOUS

## 2013-06-17 NOTE — Progress Notes (Signed)
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 She was initially found to have left axillary adenopathy and an abnormal left mammogram in November 2009. She had concomitant pancytopenia which was rapidly progressive. She had no other sites of adenopathy and no organomegaly. Biopsy of the left axillary node unexpectedly showed lymphoma. Breast biopsy showed initially intraductal carcinoma. She had disproportionate severe thrombocytopenia occurring within a week of initial diagnosis. She was admitted to the hospital and given a modified fludarabine Cytoxan and Rituxan regimen with no response. She had no response to Campath antibody. She subsequently had a good partial response to Arzerra initially given between April and June of 2010 then monthly through October of 2010. Beginning in may of 2013, blood counts began to deteriorate with fall in her platelet count down to 59,000 and rise in percent lymphocytes on her white count differential. She was started back on Arzerra on May 24th., 2013 She had an initial test dose of 300 mg and subsequent weekly 2000 mg doses given on June 10, June 20, and June 27. She has been getting significant allergic reactions to the product this time around. She developed diffuse facial erythema with the June 20 dose and towards the end of the June 27 dose, she developed vertigo and throat tightness in the infusion was discontinued. She was given additional steroids and Benadryl and the reaction subsided promptly. She did have another good partial response. Blood counts suddenly began to deteriorate again recently with fall in her platelet count from baseline 100,000 down to 40,000 by August 1. White count started to rise from baseline 7000 up to 15,000 with shift in differential to 80% lymphocytes. Hemoglobin initially stable at 11 g also started to fall now 7  g.  She was started on a brief trial of ibrutinib in August 2014. Drug was stopped after just 3 weeks due to profound fatigue. She has now had a rapid fall in her platelet count down to 3000 on September 12. This was a very abrupt fall and reproduces the same pattern that she had at time of initial diagnosis. She has been getting platelet transfusions just about every day this week but is not responding to random donor transfusions. I started her on Amicar 2 g 4 times a day. Except for the fall in her hemoglobin, she has noted no obvious bleeding and denies epistaxis, gum bleeding, hematuria, hematochezia, or melena.  She has had recurrent, intermittent, atypical, neurologic symptoms and has had multiple evaluations with MRIs and CT scans of the brain which have been unrevealing.  She is here today with her daughter to discuss further management.    Review of systems: See history of present illness   Vitals: Filed Vitals:   06/15/13 1511  BP: 179/73  Pulse: 75  Temp: 98.2 F (36.8 C)  Resp: 20   Wt Readings from Last 3 Encounters:  06/15/13 260 lb 8 oz (118.162 kg)  05/27/13 268 lb 6.4 oz (121.745 kg)  05/13/13 268 lb (121.564 kg)     PHYSICAL EXAM:  General obese African American woman Head: atraumatic Eyes: Normal Throat: No erythema or exudate Neck: Full range of motion Lymph Nodes: Single 2-3 cm lymph node left axilla Lungs: Clear to auscultation resonant to percussion Breasts: Status post left mastectomy Cardiac: Regular rhythm no murmur gallop or rub Abdominal: Soft, obese, nontender, no obvious mass or organomegaly Extremities: No edema, no calf tenderness Vascular:  No cyanosis,  no carotid bruits Neurologic she is alert and oriented, PERRLA, optic discs sharp, no hemorrhage or exudate, motor strength 5 over 5, reflexes absent symmetric at the knees 1+ symmetric at the biceps Skin: Single small ecchymosis on her left arm  Labs:   Recent Labs  06/15/13 1417   WBC 8.6  HGB 7.3*  HCT 21.7*  PLT 4*    Recent Labs  06/15/13 1417  NA 139  K 4.3  CO2 25  GLUCOSE 115  BUN 19.0  CREATININE 1.2*  CALCIUM 9.4      Images Studies/Results:   No results found.   Patient Active Problem List   Diagnosis Date Noted  . Thrombocytopenia, unspecified 06/15/2013  . Hyperlipidemia   . Memory loss   . Vertigo 02/13/2012  . Pleurisy 02/13/2012  . CLL (chronic lymphoid leukemia) in relapse 02/13/2012  . Low grade malignant lymphoma 02/03/2012  . Stroke 02/03/2012  . Benign essential HTN 02/03/2012  . Macrocytic anemia 02/03/2012  . Breast cancer left T2N0 S/P mastectomy and SLN Bx 09/2010 09/18/2011    Assessment and Plan:  #1. Multiply relapsed WDLL/CLL treated as outlined above. Unfortunately we are back in the dog house with severe thrombocytopenia. I will continue Amicar. I am in the process of obtaining HLA typed platelets. I outlined a program of low dose daily Leukeran to start at 4 mg daily combined with the newly approved second-generation anti-CD20 antibody Obinutuzumab Shelly Bombard). We reviewed potential side effects in detail and she was given printed information. 2 of her daughters accompanied her and took notes as well. There are no unusual side effects from this drug compared with its precursor drugs Rituxan and Arzerra. Infusion reactions and other allergic reaction still the most prominent potential side effects. We will need to treat despite severe thrombocytopenia and use transfusion support. Plan to start this program as soon as possible pending drug availability on September 22.  #2. Multifocal invasive and noninvasive cancer left breast status post mastectomy 10/09/2010 currently on tamoxifen.  #3. Essential hypertension  #4. Recurrent atypical neurologic symptoms with dizziness, vertigo, paresthesias. Most recent MRI brain done August 8 shows a remote infarct in the left thalamus. Mild changes in small vessels. No acute  changes. Holding aspirin due to severe thrombocytopenia.    GRANFORTUNA,JAMES M 06/17/2013, 7:33 AM

## 2013-06-17 NOTE — Telephone Encounter (Signed)
Per chemo RN and POF I have schedueld appt

## 2013-06-17 NOTE — Progress Notes (Signed)
[employer] aproved aminocaproic acid from 06/17/13-09/28/13.

## 2013-06-17 NOTE — Telephone Encounter (Signed)
I am not capable to scan and send pdf  rx re-done, to send in mail if need be

## 2013-06-17 NOTE — Telephone Encounter (Signed)
Script has been put in the mail to address below.

## 2013-06-17 NOTE — Patient Instructions (Addendum)
Platelet Transfusion Information This is information about transfusions of platelets. Platelets are tiny cells made by the bone marrow and found in the blood. When a blood vessel is damaged platelets rush to the damaged area to help form a clot. This begins the healing process. When platelets get very low your blood may have trouble clotting. This may be from:  Illness.  Blood disorder.  Chemotherapy to treat cancer. Often lower platelet counts do not usually cause problems.  Platelets usually last for 7 to 10 days. If they are not used not used in an injury, they are broken down by the liver or spleen. Symptoms of low platelet count include:  Nosebleeds.  Bleeding gums.  Heavy periods.  Bruising and tiny blood spots in the skin.  Pin point spots of bleeding are called (petechiae).  Larger bruises (purpura).  Bleeding can be more serious if it happens in the brain or bowel. Platelet transfusions are often used to keep the platelet count at an acceptable level. Serious bleeding due to low platelets is uncommon. RISKS AND COMPLICATIONS Severe side effects from platelet transfusions are uncommon. Minor reactions may include:  Itching.  Rashes.  High temperature and shivering. Medications are available to stop transfusion reactions. Let your caregivers know if you develop any of the above problems.  If you are having platelet transfusions frequently they may get less effective. This is called becoming refractory to platelets. It is uncommon. This can happen from non-immune causes and immune causes. Non-immune causes include:  High temperatures.  Some medications.  An enlarged spleen. Immune causes happen when your body discovers the platelets are not your own and begin making antibodies against them. The antibodies kill the platelets quickly. Even with platelet transfusions you may still notice problems with bleeding or bruising. Let your caregivers know about this. Other things  can be done to help if this happens.  BEFORE THE PROCEDURE   Your doctors will check your platelet count regularly.  If the platelet count is too low it may be necessary to have a platelet transfusion.  This is more important before certain procedures with a risk of bleeding such as a spinal tap.  Platelet transfusion reduces the risk of bleeding during or after the procedure.  Except in emergencies, giving a transfusion requires a written consent. Before blood is taken from a donor, a complete history is taken to make sure the person has no history of previous diseases, nor engages in risky social behavior. Examples of this are intravenous drug use or sexual activity with multiple partners. This could lead to infected blood or blood products being used. This history is done even in spite of the extensive testing to make sure the blood is safe. All blood products transfused are tested to make sure it is a match for the person getting the blood. It is also checked for infections. Blood is the safest it has ever been. The risk of getting an infection is very low. PROCEDURE  The platelets are stored in small plastic bags which are kept at a low temperature.  Each bag is called a unit and sometimes two units are given. They are given through an intravenous line by drip infusion over about one half hour.  Usually blood is collected from multiple people to get enough to transfuse.  Sometimes, the platelets are collected from a single person. This is done using a special machine that separates the platelets from the blood. The machine is called an apheresis machine. Platelets collected   in this way are called apheresed platelets. Apheresed platelets reduce the risk of becoming sensitive to the platelets. This lowers the chances of having a transfusion reaction.  As it only takes a short time to give the platelets, this treatment can be given in an outpatients department. Platelets can also be given  before or after other treatments. SEEK IMMEDIATE MEDICAL CARE IF: Any of the following symptoms over the next 12 hours or several days:  Shaking chills.  Fever with a temperature greater than 102 F (38.9 C) develops.  Back pain or muscle pain.  People around you feel you are not acting correctly, or you are confused.  Blood in the urine or bowel movements or bleeding from any place in your body.  Shortness of breath, or difficulty breathing.  Dizziness.  Fainting.  You break out in a rash or develop hives.  You have a decrease in the amount of urine you are putting out, or the urine turns a dark color or changes to pink, red, or brown.  A severe headache or stiff neck.  Bruising more easily. Document Released: 07/13/2007 Document Revised: 12/08/2011 Document Reviewed: 07/13/2007 ExitCare Patient Information 2014 [employer].  

## 2013-06-19 ENCOUNTER — Other Ambulatory Visit: Payer: Self-pay | Admitting: Oncology

## 2013-06-20 ENCOUNTER — Telehealth: Payer: Self-pay | Admitting: Oncology

## 2013-06-20 ENCOUNTER — Ambulatory Visit (HOSPITAL_BASED_OUTPATIENT_CLINIC_OR_DEPARTMENT_OTHER): Payer: Medicare Other | Admitting: Lab

## 2013-06-20 ENCOUNTER — Other Ambulatory Visit: Payer: Self-pay | Admitting: *Deleted

## 2013-06-20 ENCOUNTER — Telehealth: Payer: Self-pay | Admitting: *Deleted

## 2013-06-20 ENCOUNTER — Other Ambulatory Visit: Payer: Medicare Other

## 2013-06-20 ENCOUNTER — Ambulatory Visit (HOSPITAL_BASED_OUTPATIENT_CLINIC_OR_DEPARTMENT_OTHER): Payer: Medicare Other

## 2013-06-20 VITALS — BP 154/55 | HR 65 | Temp 98.6°F | Resp 20

## 2013-06-20 DIAGNOSIS — D696 Thrombocytopenia, unspecified: Secondary | ICD-10-CM

## 2013-06-20 DIAGNOSIS — C9192 Lymphoid leukemia, unspecified, in relapse: Secondary | ICD-10-CM

## 2013-06-20 DIAGNOSIS — C9112 Chronic lymphocytic leukemia of B-cell type in relapse: Secondary | ICD-10-CM

## 2013-06-20 LAB — CBC WITH DIFFERENTIAL/PLATELET
BASO%: 0.7 % (ref 0.0–2.0)
BASO%: 0.4 % (ref 0.0–2.0)
Basophils Absolute: 0 10*3/uL (ref 0.0–0.1)
EOS%: 0.8 % (ref 0.0–7.0)
HCT: 27.6 % — ABNORMAL LOW (ref 34.8–46.6)
HGB: 9.4 g/dL — ABNORMAL LOW (ref 11.6–15.9)
MCH: 35.9 pg — ABNORMAL HIGH (ref 25.1–34.0)
MCHC: 33.8 g/dL (ref 31.5–36.0)
MCV: 106.3 fL — ABNORMAL HIGH (ref 79.5–101.0)
MCV: 106.6 fL — ABNORMAL HIGH (ref 79.5–101.0)
MONO#: 0.7 10*3/uL (ref 0.1–0.9)
MONO%: 6.1 % (ref 0.0–14.0)
NEUT#: 1.1 10*3/uL — ABNORMAL LOW (ref 1.5–6.5)
NEUT%: 12.8 % — ABNORMAL LOW (ref 38.4–76.8)
RBC: 2.7 10*6/uL — ABNORMAL LOW (ref 3.70–5.45)
RDW: 20.6 % — ABNORMAL HIGH (ref 11.2–14.5)
WBC: 9 10*3/uL (ref 3.9–10.3)
lymph#: 7.1 10*3/uL — ABNORMAL HIGH (ref 0.9–3.3)
nRBC: 0 % (ref 0–0)

## 2013-06-20 LAB — PREPARE PLATELET PHERESIS: Unit division: 0

## 2013-06-20 MED ORDER — HEPARIN SOD (PORK) LOCK FLUSH 100 UNIT/ML IV SOLN
250.0000 [IU] | INTRAVENOUS | Status: DC | PRN
  Filled 2013-06-20: qty 5

## 2013-06-20 MED ORDER — SODIUM CHLORIDE 0.9 % IV SOLN
250.0000 mL | Freq: Once | INTRAVENOUS | Status: AC
  Administered 2013-06-20: 250 mL via INTRAVENOUS

## 2013-06-20 MED ORDER — SODIUM CHLORIDE 0.9 % IJ SOLN
3.0000 mL | INTRAMUSCULAR | Status: DC | PRN
  Filled 2013-06-20: qty 10

## 2013-06-20 NOTE — Telephone Encounter (Signed)
gve the pt's dtr the sept 2014 appt calendar along with the avs.

## 2013-06-20 NOTE — Telephone Encounter (Signed)
Received call from [facility] stating that match was found for HLA platelets & these were received Sun & will expire tomorrow @ [facility].  Informed that pt is coming today & will probably need them.  She would like a call back if not so that someone else could use them.

## 2013-06-20 NOTE — Patient Instructions (Signed)
Platelet Transfusion Information This is information about transfusions of platelets. Platelets are tiny cells made by the bone marrow and found in the blood. When a blood vessel is damaged platelets rush to the damaged area to help form a clot. This begins the healing process. When platelets get very low your blood may have trouble clotting. This may be from:  Illness.  Blood disorder.  Chemotherapy to treat cancer. Often lower platelet counts do not usually cause problems.  Platelets usually last for 7 to 10 days. If they are not used not used in an injury, they are broken down by the liver or spleen. Symptoms of low platelet count include:  Nosebleeds.  Bleeding gums.  Heavy periods.  Bruising and tiny blood spots in the skin.  Pin point spots of bleeding are called (petechiae).  Larger bruises (purpura).  Bleeding can be more serious if it happens in the brain or bowel. Platelet transfusions are often used to keep the platelet count at an acceptable level. Serious bleeding due to low platelets is uncommon. RISKS AND COMPLICATIONS Severe side effects from platelet transfusions are uncommon. Minor reactions may include:  Itching.  Rashes.  High temperature and shivering. Medications are available to stop transfusion reactions. Let your caregivers know if you develop any of the above problems.  If you are having platelet transfusions frequently they may get less effective. This is called becoming refractory to platelets. It is uncommon. This can happen from non-immune causes and immune causes. Non-immune causes include:  High temperatures.  Some medications.  An enlarged spleen. Immune causes happen when your body discovers the platelets are not your own and begin making antibodies against them. The antibodies kill the platelets quickly. Even with platelet transfusions you may still notice problems with bleeding or bruising. Let your caregivers know about this. Other things  can be done to help if this happens.  BEFORE THE PROCEDURE   Your doctors will check your platelet count regularly.  If the platelet count is too low it may be necessary to have a platelet transfusion.  This is more important before certain procedures with a risk of bleeding such as a spinal tap.  Platelet transfusion reduces the risk of bleeding during or after the procedure.  Except in emergencies, giving a transfusion requires a written consent. Before blood is taken from a donor, a complete history is taken to make sure the person has no history of previous diseases, nor engages in risky social behavior. Examples of this are intravenous drug use or sexual activity with multiple partners. This could lead to infected blood or blood products being used. This history is done even in spite of the extensive testing to make sure the blood is safe. All blood products transfused are tested to make sure it is a match for the person getting the blood. It is also checked for infections. Blood is the safest it has ever been. The risk of getting an infection is very low. PROCEDURE  The platelets are stored in small plastic bags which are kept at a low temperature.  Each bag is called a unit and sometimes two units are given. They are given through an intravenous line by drip infusion over about one half hour.  Usually blood is collected from multiple people to get enough to transfuse.  Sometimes, the platelets are collected from a single person. This is done using a special machine that separates the platelets from the blood. The machine is called an apheresis machine. Platelets collected   in this way are called apheresed platelets. Apheresed platelets reduce the risk of becoming sensitive to the platelets. This lowers the chances of having a transfusion reaction.  As it only takes a short time to give the platelets, this treatment can be given in an outpatients department. Platelets can also be given  before or after other treatments. SEEK IMMEDIATE MEDICAL CARE IF: Any of the following symptoms over the next 12 hours or several days:  Shaking chills.  Fever with a temperature greater than 102 F (38.9 C) develops.  Back pain or muscle pain.  People around you feel you are not acting correctly, or you are confused.  Blood in the urine or bowel movements or bleeding from any place in your body.  Shortness of breath, or difficulty breathing.  Dizziness.  Fainting.  You break out in a rash or develop hives.  You have a decrease in the amount of urine you are putting out, or the urine turns a dark color or changes to pink, red, or brown.  A severe headache or stiff neck.  Bruising more easily. Document Released: 07/13/2007 Document Revised: 12/08/2011 Document Reviewed: 07/13/2007 ExitCare Patient Information 2014 [employer].  

## 2013-06-20 NOTE — Telephone Encounter (Signed)
LATE ENTRY:  FMLA papers given to daughter Everette Rank & copy sent for scanning.

## 2013-06-21 ENCOUNTER — Other Ambulatory Visit: Payer: Self-pay | Admitting: *Deleted

## 2013-06-21 ENCOUNTER — Telehealth: Payer: Self-pay | Admitting: *Deleted

## 2013-06-21 DIAGNOSIS — D696 Thrombocytopenia, unspecified: Secondary | ICD-10-CM

## 2013-06-21 LAB — PREPARE PLATELET PHERESIS

## 2013-06-21 NOTE — Telephone Encounter (Signed)
Pharmacist saw that [name] is still on patient's medication list.   Called and spoke with dtr. [name].  Per [name], she stopped taking the imbruvica on 9/16.  Will remove from her medication list.

## 2013-06-22 ENCOUNTER — Telehealth: Payer: Self-pay | Admitting: Dietician

## 2013-06-22 ENCOUNTER — Ambulatory Visit (HOSPITAL_BASED_OUTPATIENT_CLINIC_OR_DEPARTMENT_OTHER): Payer: Medicare Other

## 2013-06-22 ENCOUNTER — Other Ambulatory Visit (HOSPITAL_BASED_OUTPATIENT_CLINIC_OR_DEPARTMENT_OTHER): Payer: Medicare Other | Admitting: Lab

## 2013-06-22 VITALS — BP 148/54 | HR 67 | Temp 98.3°F | Resp 18

## 2013-06-22 DIAGNOSIS — D696 Thrombocytopenia, unspecified: Secondary | ICD-10-CM

## 2013-06-22 DIAGNOSIS — C8590 Non-Hodgkin lymphoma, unspecified, unspecified site: Secondary | ICD-10-CM

## 2013-06-22 DIAGNOSIS — C9192 Lymphoid leukemia, unspecified, in relapse: Secondary | ICD-10-CM

## 2013-06-22 DIAGNOSIS — C9112 Chronic lymphocytic leukemia of B-cell type in relapse: Secondary | ICD-10-CM

## 2013-06-22 DIAGNOSIS — Z5112 Encounter for antineoplastic immunotherapy: Secondary | ICD-10-CM

## 2013-06-22 LAB — CBC WITH DIFFERENTIAL/PLATELET
BASO%: 0.5 % (ref 0.0–2.0)
Basophils Absolute: 0 10*3/uL (ref 0.0–0.1)
EOS%: 0.9 % (ref 0.0–7.0)
HCT: 27.5 % — ABNORMAL LOW (ref 34.8–46.6)
HGB: 9.3 g/dL — ABNORMAL LOW (ref 11.6–15.9)
LYMPH%: 79.5 % — ABNORMAL HIGH (ref 14.0–49.7)
MCV: 106.2 fL — ABNORMAL HIGH (ref 79.5–101.0)
MONO#: 0.5 10*3/uL (ref 0.1–0.9)
NEUT#: 1.1 10*3/uL — ABNORMAL LOW (ref 1.5–6.5)
NEUT%: 13.1 % — ABNORMAL LOW (ref 38.4–76.8)
RBC: 2.59 10*6/uL — ABNORMAL LOW (ref 3.70–5.45)
RDW: 20.3 % — ABNORMAL HIGH (ref 11.2–14.5)
WBC: 8.5 10*3/uL (ref 3.9–10.3)
lymph#: 6.7 10*3/uL — ABNORMAL HIGH (ref 0.9–3.3)
nRBC: 0 % (ref 0–0)

## 2013-06-22 MED ORDER — DIPHENHYDRAMINE HCL 50 MG/ML IJ SOLN
50.0000 mg | Freq: Once | INTRAMUSCULAR | Status: AC
  Administered 2013-06-22: 50 mg via INTRAVENOUS

## 2013-06-22 MED ORDER — SODIUM CHLORIDE 0.9 % IV SOLN
Freq: Once | INTRAVENOUS | Status: AC
  Administered 2013-06-22: 10:00:00 via INTRAVENOUS

## 2013-06-22 MED ORDER — HEPARIN SOD (PORK) LOCK FLUSH 100 UNIT/ML IV SOLN
500.0000 [IU] | Freq: Once | INTRAVENOUS | Status: AC | PRN
  Administered 2013-06-22: 500 [IU]
  Filled 2013-06-22: qty 5

## 2013-06-22 MED ORDER — ACETAMINOPHEN 325 MG PO TABS
650.0000 mg | ORAL_TABLET | Freq: Once | ORAL | Status: AC
  Administered 2013-06-22: 650 mg via ORAL

## 2013-06-22 MED ORDER — ACETAMINOPHEN 325 MG PO TABS
ORAL_TABLET | ORAL | Status: AC
  Filled 2013-06-22: qty 2

## 2013-06-22 MED ORDER — DEXAMETHASONE SODIUM PHOSPHATE 20 MG/5ML IJ SOLN
INTRAMUSCULAR | Status: AC
  Filled 2013-06-22: qty 5

## 2013-06-22 MED ORDER — SODIUM CHLORIDE 0.9 % IV SOLN
100.0000 mg | Freq: Once | INTRAVENOUS | Status: AC
  Administered 2013-06-22: 100 mg via INTRAVENOUS
  Filled 2013-06-22: qty 4

## 2013-06-22 MED ORDER — DEXAMETHASONE SODIUM PHOSPHATE 20 MG/5ML IJ SOLN
20.0000 mg | Freq: Once | INTRAMUSCULAR | Status: AC
  Administered 2013-06-22: 20 mg via INTRAVENOUS

## 2013-06-22 MED ORDER — SODIUM CHLORIDE 0.9 % IJ SOLN
10.0000 mL | INTRAMUSCULAR | Status: DC | PRN
  Administered 2013-06-22: 10 mL
  Filled 2013-06-22: qty 10

## 2013-06-22 MED ORDER — DIPHENHYDRAMINE HCL 50 MG/ML IJ SOLN
INTRAMUSCULAR | Status: AC
  Filled 2013-06-22: qty 1

## 2013-06-22 NOTE — Patient Instructions (Addendum)
Cancer Center Discharge Instructions for Patients Receiving Chemotherapy  Today you received the following chemotherapy agents: Gazyva.  To help prevent nausea and vomiting after your treatment, we encourage you to take your nausea medication as prescribed.   If you develop nausea and vomiting that is not controlled by your nausea medication, call the clinic.   BELOW ARE SYMPTOMS THAT SHOULD BE REPORTED IMMEDIATELY:  *FEVER GREATER THAN 100.5 F  *CHILLS WITH OR WITHOUT FEVER  NAUSEA AND VOMITING THAT IS NOT CONTROLLED WITH YOUR NAUSEA MEDICATION  *UNUSUAL SHORTNESS OF BREATH  *UNUSUAL BRUISING OR BLEEDING  TENDERNESS IN MOUTH AND THROAT WITH OR WITHOUT PRESENCE OF ULCERS  *URINARY PROBLEMS  *BOWEL PROBLEMS  UNUSUAL RASH Items with * indicate a potential emergency and should be followed up as soon as possible.  Feel free to call the clinic you have any questions or concerns. The clinic phone number is [phone].    

## 2013-06-22 NOTE — Telephone Encounter (Signed)
Brief Outpatient Oncology Nutrition Note  Patient has been identified to be at risk on malnutrition screen.  Wt Readings from Last 10 Encounters:  06/15/13 260 lb 8 oz (118.162 kg)  05/27/13 268 lb 6.4 oz (121.745 kg)  05/13/13 268 lb (121.564 kg)  05/13/13 266 lb 14.4 oz (121.065 kg)  04/29/13 268 lb (121.564 kg)  04/12/13 269 lb (122.018 kg)  03/29/13 269 lb (122.018 kg)  02/01/13 273 lb (123.832 kg)  12/31/12 270 lb 4.8 oz (122.607 kg)  12/10/12 270 lb 3.2 oz (122.562 kg)    Patient with CLL with complications of thrombocytopenia.  Called and spoke with daughter who reports that patient is overall eating well.  Discussed importance of a balanced diet with adequate nutrition and protein.  Contact information for [facility] provided.  Oran Rein, RD, LDN

## 2013-06-23 ENCOUNTER — Telehealth: Payer: Self-pay | Admitting: Oncology

## 2013-06-23 ENCOUNTER — Ambulatory Visit (HOSPITAL_BASED_OUTPATIENT_CLINIC_OR_DEPARTMENT_OTHER): Payer: Medicare Other

## 2013-06-23 ENCOUNTER — Other Ambulatory Visit: Payer: Self-pay | Admitting: Oncology

## 2013-06-23 VITALS — BP 155/65 | HR 69 | Temp 98.3°F | Resp 20

## 2013-06-23 DIAGNOSIS — C9110 Chronic lymphocytic leukemia of B-cell type not having achieved remission: Secondary | ICD-10-CM

## 2013-06-23 DIAGNOSIS — C9192 Lymphoid leukemia, unspecified, in relapse: Secondary | ICD-10-CM

## 2013-06-23 DIAGNOSIS — C8590 Non-Hodgkin lymphoma, unspecified, unspecified site: Secondary | ICD-10-CM

## 2013-06-23 DIAGNOSIS — Z5112 Encounter for antineoplastic immunotherapy: Secondary | ICD-10-CM

## 2013-06-23 DIAGNOSIS — D696 Thrombocytopenia, unspecified: Secondary | ICD-10-CM

## 2013-06-23 DIAGNOSIS — C9112 Chronic lymphocytic leukemia of B-cell type in relapse: Secondary | ICD-10-CM

## 2013-06-23 LAB — PREPARE PLATELET PHERESIS

## 2013-06-23 MED ORDER — ACETAMINOPHEN 325 MG PO TABS
650.0000 mg | ORAL_TABLET | Freq: Once | ORAL | Status: AC
  Administered 2013-06-23: 650 mg via ORAL

## 2013-06-23 MED ORDER — SODIUM CHLORIDE 0.9 % IJ SOLN
10.0000 mL | INTRAMUSCULAR | Status: DC | PRN
  Administered 2013-06-23: 10 mL
  Filled 2013-06-23: qty 10

## 2013-06-23 MED ORDER — DIPHENHYDRAMINE HCL 50 MG/ML IJ SOLN
INTRAMUSCULAR | Status: AC
  Filled 2013-06-23: qty 1

## 2013-06-23 MED ORDER — SODIUM CHLORIDE 0.9 % IV SOLN
900.0000 mg | Freq: Once | INTRAVENOUS | Status: AC
  Administered 2013-06-23: 900 mg via INTRAVENOUS
  Filled 2013-06-23: qty 36

## 2013-06-23 MED ORDER — ACETAMINOPHEN 325 MG PO TABS
ORAL_TABLET | ORAL | Status: AC
  Filled 2013-06-23: qty 2

## 2013-06-23 MED ORDER — DIPHENHYDRAMINE HCL 50 MG/ML IJ SOLN
50.0000 mg | Freq: Once | INTRAMUSCULAR | Status: AC
  Administered 2013-06-23: 50 mg via INTRAVENOUS

## 2013-06-23 MED ORDER — HEPARIN SOD (PORK) LOCK FLUSH 100 UNIT/ML IV SOLN
500.0000 [IU] | Freq: Once | INTRAVENOUS | Status: AC | PRN
  Administered 2013-06-23: 500 [IU]
  Filled 2013-06-23: qty 5

## 2013-06-23 MED ORDER — DEXAMETHASONE SODIUM PHOSPHATE 20 MG/5ML IJ SOLN
INTRAMUSCULAR | Status: AC
  Filled 2013-06-23: qty 5

## 2013-06-23 MED ORDER — SODIUM CHLORIDE 0.9 % IV SOLN
Freq: Once | INTRAVENOUS | Status: AC
  Administered 2013-06-23: 12:00:00 via INTRAVENOUS

## 2013-06-23 MED ORDER — DEXAMETHASONE SODIUM PHOSPHATE 20 MG/5ML IJ SOLN
20.0000 mg | Freq: Once | INTRAMUSCULAR | Status: AC
  Administered 2013-06-23: 20 mg via INTRAVENOUS

## 2013-06-23 NOTE — Patient Instructions (Addendum)
[facility] Discharge Instructions for Patients Receiving Chemotherapy  Today you received the following medication - Gazyva  To help prevent nausea and vomiting after your treatment, we encourage you to take your nausea medication Compazine as ordered. If you develop nausea and vomiting that is not controlled by your nausea medication, call the clinic.   BELOW ARE SYMPTOMS THAT SHOULD BE REPORTED IMMEDIATELY:  *FEVER GREATER THAN 100.5 F  *CHILLS WITH OR WITHOUT FEVER  NAUSEA AND VOMITING THAT IS NOT CONTROLLED WITH YOUR NAUSEA MEDICATION  *UNUSUAL SHORTNESS OF BREATH  *UNUSUAL BRUISING OR BLEEDING  TENDERNESS IN MOUTH AND THROAT WITH OR WITHOUT PRESENCE OF ULCERS  *URINARY PROBLEMS  *BOWEL PROBLEMS  UNUSUAL RASH Items with * indicate a potential emergency and should be followed up as soon as possible.  Feel free to call the clinic you have any questions or concerns. The clinic phone number is [phone].

## 2013-06-23 NOTE — Telephone Encounter (Signed)
Called pt and left message regarding 06/24/13 lab and md

## 2013-06-24 ENCOUNTER — Other Ambulatory Visit (HOSPITAL_BASED_OUTPATIENT_CLINIC_OR_DEPARTMENT_OTHER): Payer: Medicare Other | Admitting: Lab

## 2013-06-24 ENCOUNTER — Encounter: Payer: Self-pay | Admitting: Nutrition

## 2013-06-24 ENCOUNTER — Telehealth: Payer: Self-pay | Admitting: Oncology

## 2013-06-24 ENCOUNTER — Telehealth: Payer: Self-pay | Admitting: *Deleted

## 2013-06-24 ENCOUNTER — Ambulatory Visit (HOSPITAL_BASED_OUTPATIENT_CLINIC_OR_DEPARTMENT_OTHER): Payer: Medicare Other | Admitting: Nurse Practitioner

## 2013-06-24 VITALS — BP 180/68 | HR 57 | Temp 97.1°F | Resp 19 | Ht 62.0 in | Wt 264.5 lb

## 2013-06-24 DIAGNOSIS — C9192 Lymphoid leukemia, unspecified, in relapse: Secondary | ICD-10-CM

## 2013-06-24 DIAGNOSIS — D696 Thrombocytopenia, unspecified: Secondary | ICD-10-CM

## 2013-06-24 DIAGNOSIS — C9112 Chronic lymphocytic leukemia of B-cell type in relapse: Secondary | ICD-10-CM

## 2013-06-24 LAB — CBC WITH DIFFERENTIAL/PLATELET
Basophils Absolute: 0.1 10*3/uL (ref 0.0–0.1)
Eosinophils Absolute: 0 10*3/uL (ref 0.0–0.5)
HCT: 27.7 % — ABNORMAL LOW (ref 34.8–46.6)
HGB: 9.4 g/dL — ABNORMAL LOW (ref 11.6–15.9)
LYMPH%: 57.1 % — ABNORMAL HIGH (ref 14.0–49.7)
MCV: 105.7 fL — ABNORMAL HIGH (ref 79.5–101.0)
MONO%: 10.1 % (ref 0.0–14.0)
NEUT#: 2.7 10*3/uL (ref 1.5–6.5)
RBC: 2.62 10*6/uL — ABNORMAL LOW (ref 3.70–5.45)
RDW: 20.3 % — ABNORMAL HIGH (ref 11.2–14.5)
WBC: 8.5 10*3/uL (ref 3.9–10.3)
lymph#: 4.9 10*3/uL — ABNORMAL HIGH (ref 0.9–3.3)

## 2013-06-24 NOTE — Progress Notes (Signed)
Patient's daughter requesting samples of oral nutrition supplements.  Patient weight fluctuates but currently patient weighs 264 pounds.  I have educated patient's daughter on ways to give patient more protein foods.  She was given a fact sheet.  I also provided patient's daughter with multiple samples of boost and high-protein boost.  Patient is to begin with approximately one daily or one every other day and monitor for weight maintenance.  I've encouraged patient's daughter to make appointment with RD if desired. Contact information provided.

## 2013-06-24 NOTE — Telephone Encounter (Signed)
gv adn printed appt sched an avs for pt for Sept and OCT

## 2013-06-24 NOTE — Telephone Encounter (Signed)
Per staff message and POF I have scheduled appts.  JMW  

## 2013-06-24 NOTE — Progress Notes (Signed)
OFFICE PROGRESS NOTE  Interval history:  [name] is a 79 year old woman with relapsed CLL/well-differentiated lymphocytic lymphoma. She is seen today for followup since beginning treatment with Obinutuzumab and Leukeran.   She reports tolerating the Obinutuzumab infusions on 06/22/2013 and 06/23/2013 well with no signs of a reaction.  She was last transfused platelets on 06/22/2013 for a platelet count of 11,000.  She continues to be fatigued. No fevers. She continues to have intermittent sweats. She denies any bleeding. No nausea or vomiting. No mouth sores. No diarrhea. She has noted increased frequency of formed stools. She is having 2 bowel movements a day. No skin rash. No unusual headaches. She has stable mild blurred vision. No falls or balance problems. She has periodic dizziness which is a chronic problem for her. She takes meclizine as needed. She continues physical therapy. She has stable mild right-sided weakness which she reports is secondary to a stroke. No urinary complaints. Appetite overall is good. She thinks the lymph nodes at the left axilla has resolved.   Objective: Blood pressure 180/68, pulse 57, temperature 97.1 F (36.2 C), temperature source Oral, resp. rate 19, height 5\' 2"  (1.575 m), weight 264 lb 8 oz (119.976 kg).  Oropharynx is without thrush or ulceration. No palpable cervical, supraclavicular or axillary lymph nodes. Specifically no definite left axillary lymph node palpated on exam today. Lungs clear bilaterally. No wheezes or rales. Regular cardiac rhythm. Port-A-Cath site is without erythema. Abdomen is soft and nontender. Obese. No obvious splenomegaly. Trace lower leg edema bilaterally. Motor strength 5 over 5. She is alert and oriented. She ambulates with a walker. Single ecchymosis at the left medial knee.  Lab Results: Lab Results  Component Value Date   WBC 8.5 06/24/2013   HGB 9.4* 06/24/2013   HCT 27.7* 06/24/2013   MCV 105.7* 06/24/2013   PLT 42*  06/24/2013    Chemistry:    Chemistry      Component Value Date/Time   NA 139 06/15/2013 1417   NA 138 05/27/2013 1226   NA 142 01/01/2011   K 4.3 06/15/2013 1417   K 4.2 05/27/2013 1226   CL 105 05/27/2013 1226   CL 107 02/25/2013 1028   CO2 25 06/15/2013 1417   CO2 25 05/27/2013 1226   BUN 19.0 06/15/2013 1417   BUN 17 05/27/2013 1226   BUN 16 01/01/2011   CREATININE 1.2* 06/15/2013 1417   CREATININE 1.26* 05/27/2013 1226   CREATININE 0.9 01/01/2011   GLU 104 01/01/2011      Component Value Date/Time   CALCIUM 9.4 06/15/2013 1417   CALCIUM 9.8 05/27/2013 1226   ALKPHOS 78 06/15/2013 1417   ALKPHOS 76 05/27/2013 1226   AST 23 06/15/2013 1417   AST 28 05/27/2013 1226   ALT 25 06/15/2013 1417   ALT 24 05/27/2013 1226   BILITOT 0.54 06/15/2013 1417   BILITOT 0.5 05/27/2013 1226       Studies/Results: No results found.  Medications: I have reviewed the patient's current medications.  Assessment/Plan:  1. Relapsed CLL/well-differentiated lymphocytic lymphoma. She began a trial of Ibrutinib 05/27/2013. It was stopped on 06/10/2013 due to profound fatigue, nausea and anorexia. In addition, labs on 06/10/2013 showed significant deterioration in blood counts with the platelet count down to 3000, felt to be secondary to disease progression as this was how she initially presented. Treatment was initiated with Obinutuzumab and Leukeran on 06/23/2013. 2. Severe thrombocytopenia. Most recently transfused platelets 06/22/2013. 3. Extreme fatigue, anorexia while on Ibrutinib. 4. Multifocal invasive  and noninvasive cancers left breast status post mastectomy 10/09/2010 currently on tamoxifen. 5. Hypertension. 6. Recurrent atypical neurologic symptoms with dizziness, vertigo, paresthesias. 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 Mild changes in small vessels. No acute changes. Aspirin on hold due to severe thrombocytopenia.  Disposition-she appears to have tolerated the  first 2 infusions with Obinutuzumab well. The platelet count and white count differential are improved and the left axillary lymph node is no longer palpable.  She will return for followup labs on 06/27/2013 and for the day 8 Obinutuzumab infusion on 06/29/2013.  She will be seen in followup on 07/04/2013. She will contact the office in the interim with any problems. We specifically discussed bleeding, fever, signs of infection. She and her daughter expressed their understanding.  Lonna Cobb ANP/GNP-BC

## 2013-06-27 ENCOUNTER — Other Ambulatory Visit (HOSPITAL_BASED_OUTPATIENT_CLINIC_OR_DEPARTMENT_OTHER): Payer: Medicare Other

## 2013-06-27 DIAGNOSIS — C9192 Lymphoid leukemia, unspecified, in relapse: Secondary | ICD-10-CM

## 2013-06-27 DIAGNOSIS — C9112 Chronic lymphocytic leukemia of B-cell type in relapse: Secondary | ICD-10-CM

## 2013-06-27 LAB — CBC WITH DIFFERENTIAL/PLATELET
Basophils Absolute: 0 10*3/uL (ref 0.0–0.1)
Eosinophils Absolute: 0.1 10*3/uL (ref 0.0–0.5)
HGB: 10 g/dL — ABNORMAL LOW (ref 11.6–15.9)
LYMPH%: 66.8 % — ABNORMAL HIGH (ref 14.0–49.7)
MCHC: 34.6 g/dL (ref 31.5–36.0)
MCV: 104.7 fL — ABNORMAL HIGH (ref 79.5–101.0)
MONO#: 0.2 10*3/uL (ref 0.1–0.9)
MONO%: 4.9 % (ref 0.0–14.0)
NEUT#: 1 10*3/uL — ABNORMAL LOW (ref 1.5–6.5)
Platelets: 39 10*3/uL — ABNORMAL LOW (ref 145–400)
RBC: 2.76 10*6/uL — ABNORMAL LOW (ref 3.70–5.45)
RDW: 20 % — ABNORMAL HIGH (ref 11.2–14.5)
WBC: 3.9 10*3/uL (ref 3.9–10.3)
nRBC: 1 % — ABNORMAL HIGH (ref 0–0)

## 2013-06-29 ENCOUNTER — Ambulatory Visit (HOSPITAL_BASED_OUTPATIENT_CLINIC_OR_DEPARTMENT_OTHER): Payer: Medicare Other

## 2013-06-29 ENCOUNTER — Other Ambulatory Visit (HOSPITAL_BASED_OUTPATIENT_CLINIC_OR_DEPARTMENT_OTHER): Payer: Medicare Other | Admitting: Lab

## 2013-06-29 VITALS — BP 134/70 | HR 73 | Temp 98.3°F | Resp 20

## 2013-06-29 DIAGNOSIS — D696 Thrombocytopenia, unspecified: Secondary | ICD-10-CM

## 2013-06-29 DIAGNOSIS — C8590 Non-Hodgkin lymphoma, unspecified, unspecified site: Secondary | ICD-10-CM

## 2013-06-29 DIAGNOSIS — C9112 Chronic lymphocytic leukemia of B-cell type in relapse: Secondary | ICD-10-CM

## 2013-06-29 DIAGNOSIS — C9192 Lymphoid leukemia, unspecified, in relapse: Secondary | ICD-10-CM

## 2013-06-29 DIAGNOSIS — Z5112 Encounter for antineoplastic immunotherapy: Secondary | ICD-10-CM

## 2013-06-29 LAB — CBC WITH DIFFERENTIAL/PLATELET
BASO%: 0.2 % (ref 0.0–2.0)
Basophils Absolute: 0 10*3/uL (ref 0.0–0.1)
HCT: 28.5 % — ABNORMAL LOW (ref 34.8–46.6)
HGB: 9.7 g/dL — ABNORMAL LOW (ref 11.6–15.9)
LYMPH%: 66.3 % — ABNORMAL HIGH (ref 14.0–49.7)
MONO#: 0.2 10*3/uL (ref 0.1–0.9)
NEUT%: 25.8 % — ABNORMAL LOW (ref 38.4–76.8)
Platelets: 32 10*3/uL — ABNORMAL LOW (ref 145–400)
RBC: 2.71 10*6/uL — ABNORMAL LOW (ref 3.70–5.45)
WBC: 4 10*3/uL (ref 3.9–10.3)
lymph#: 2.7 10*3/uL (ref 0.9–3.3)

## 2013-06-29 MED ORDER — SODIUM CHLORIDE 0.9 % IJ SOLN
10.0000 mL | INTRAMUSCULAR | Status: DC | PRN
  Administered 2013-06-29: 10 mL
  Filled 2013-06-29: qty 10

## 2013-06-29 MED ORDER — DEXAMETHASONE SODIUM PHOSPHATE 20 MG/5ML IJ SOLN
INTRAMUSCULAR | Status: AC
  Filled 2013-06-29: qty 5

## 2013-06-29 MED ORDER — SODIUM CHLORIDE 0.9 % IV SOLN
Freq: Once | INTRAVENOUS | Status: AC
  Administered 2013-06-29: 09:00:00 via INTRAVENOUS

## 2013-06-29 MED ORDER — DIPHENHYDRAMINE HCL 50 MG/ML IJ SOLN
50.0000 mg | Freq: Once | INTRAMUSCULAR | Status: AC
  Administered 2013-06-29: 50 mg via INTRAVENOUS

## 2013-06-29 MED ORDER — HEPARIN SOD (PORK) LOCK FLUSH 100 UNIT/ML IV SOLN
500.0000 [IU] | Freq: Once | INTRAVENOUS | Status: AC | PRN
  Administered 2013-06-29: 500 [IU]
  Filled 2013-06-29: qty 5

## 2013-06-29 MED ORDER — DIPHENHYDRAMINE HCL 50 MG/ML IJ SOLN
INTRAMUSCULAR | Status: AC
  Filled 2013-06-29: qty 1

## 2013-06-29 MED ORDER — ACETAMINOPHEN 325 MG PO TABS
ORAL_TABLET | ORAL | Status: AC
  Filled 2013-06-29: qty 2

## 2013-06-29 MED ORDER — DEXAMETHASONE SODIUM PHOSPHATE 20 MG/5ML IJ SOLN
20.0000 mg | Freq: Once | INTRAMUSCULAR | Status: AC
  Administered 2013-06-29: 20 mg via INTRAVENOUS

## 2013-06-29 MED ORDER — OBINUTUZUMAB CHEMO INJECTION 1000 MG/40ML
1000.0000 mg | Freq: Once | INTRAVENOUS | Status: AC
  Administered 2013-06-29: 1000 mg via INTRAVENOUS
  Filled 2013-06-29: qty 40

## 2013-06-29 MED ORDER — ACETAMINOPHEN 325 MG PO TABS
650.0000 mg | ORAL_TABLET | Freq: Once | ORAL | Status: AC
  Administered 2013-06-29: 650 mg via ORAL

## 2013-06-29 NOTE — Patient Instructions (Signed)
Cancer Center Discharge Instructions for Patients Receiving Chemotherapy  Today you received the following chemotherapy agents gazyva.  To help prevent nausea and vomiting after your treatment, we encourage you to take your nausea medication compazine.   If you develop nausea and vomiting that is not controlled by your nausea medication, call the clinic.   BELOW ARE SYMPTOMS THAT SHOULD BE REPORTED IMMEDIATELY:  *FEVER GREATER THAN 100.5 F  *CHILLS WITH OR WITHOUT FEVER  NAUSEA AND VOMITING THAT IS NOT CONTROLLED WITH YOUR NAUSEA MEDICATION  *UNUSUAL SHORTNESS OF BREATH  *UNUSUAL BRUISING OR BLEEDING  TENDERNESS IN MOUTH AND THROAT WITH OR WITHOUT PRESENCE OF ULCERS  *URINARY PROBLEMS  *BOWEL PROBLEMS  UNUSUAL RASH Items with * indicate a potential emergency and should be followed up as soon as possible.  Feel free to call the clinic you have any questions or concerns. The clinic phone number is [phone].    

## 2013-06-29 NOTE — Progress Notes (Signed)
Patient reports occasional abdominal pains. She takes one tylenol or 1/2 tylenol and it goes away.

## 2013-07-01 ENCOUNTER — Other Ambulatory Visit (HOSPITAL_BASED_OUTPATIENT_CLINIC_OR_DEPARTMENT_OTHER): Payer: Medicare Other | Admitting: Lab

## 2013-07-01 DIAGNOSIS — C9112 Chronic lymphocytic leukemia of B-cell type in relapse: Secondary | ICD-10-CM

## 2013-07-01 DIAGNOSIS — C9192 Lymphoid leukemia, unspecified, in relapse: Secondary | ICD-10-CM

## 2013-07-01 LAB — CBC WITH DIFFERENTIAL/PLATELET
Basophils Absolute: 0 10*3/uL (ref 0.0–0.1)
EOS%: 1 % (ref 0.0–7.0)
Eosinophils Absolute: 0.1 10*3/uL (ref 0.0–0.5)
HCT: 28.1 % — ABNORMAL LOW (ref 34.8–46.6)
HGB: 9.6 g/dL — ABNORMAL LOW (ref 11.6–15.9)
LYMPH%: 52.6 % — ABNORMAL HIGH (ref 14.0–49.7)
MCH: 36.2 pg — ABNORMAL HIGH (ref 25.1–34.0)
MCV: 106 fL — ABNORMAL HIGH (ref 79.5–101.0)
MONO%: 6.1 % (ref 0.0–14.0)
NEUT#: 2.4 10*3/uL (ref 1.5–6.5)
NEUT%: 40 % (ref 38.4–76.8)
Platelets: 40 10*3/uL — ABNORMAL LOW (ref 145–400)
RDW: 20.7 % — ABNORMAL HIGH (ref 11.2–14.5)
WBC: 6.1 10*3/uL (ref 3.9–10.3)

## 2013-07-04 ENCOUNTER — Ambulatory Visit (HOSPITAL_BASED_OUTPATIENT_CLINIC_OR_DEPARTMENT_OTHER): Payer: Medicare Other | Admitting: Oncology

## 2013-07-04 ENCOUNTER — Telehealth: Payer: Self-pay | Admitting: Oncology

## 2013-07-04 ENCOUNTER — Other Ambulatory Visit (HOSPITAL_BASED_OUTPATIENT_CLINIC_OR_DEPARTMENT_OTHER): Payer: Medicare Other | Admitting: Lab

## 2013-07-04 VITALS — BP 167/68 | HR 74 | Temp 98.1°F | Resp 18 | Ht 62.0 in | Wt 257.3 lb

## 2013-07-04 DIAGNOSIS — I10 Essential (primary) hypertension: Secondary | ICD-10-CM

## 2013-07-04 DIAGNOSIS — C9112 Chronic lymphocytic leukemia of B-cell type in relapse: Secondary | ICD-10-CM

## 2013-07-04 DIAGNOSIS — C9192 Lymphoid leukemia, unspecified, in relapse: Secondary | ICD-10-CM

## 2013-07-04 DIAGNOSIS — C50919 Malignant neoplasm of unspecified site of unspecified female breast: Secondary | ICD-10-CM

## 2013-07-04 DIAGNOSIS — C8590 Non-Hodgkin lymphoma, unspecified, unspecified site: Secondary | ICD-10-CM

## 2013-07-04 DIAGNOSIS — R42 Dizziness and giddiness: Secondary | ICD-10-CM

## 2013-07-04 DIAGNOSIS — D696 Thrombocytopenia, unspecified: Secondary | ICD-10-CM

## 2013-07-04 LAB — COMPREHENSIVE METABOLIC PANEL (CC13)
ALT: 57 U/L — ABNORMAL HIGH (ref 0–55)
AST: 40 U/L — ABNORMAL HIGH (ref 5–34)
Alkaline Phosphatase: 82 U/L (ref 40–150)
CO2: 21 mEq/L — ABNORMAL LOW (ref 22–29)
Creatinine: 1.3 mg/dL — ABNORMAL HIGH (ref 0.6–1.1)
Glucose: 127 mg/dl (ref 70–140)
Potassium: 3.9 mEq/L (ref 3.5–5.1)
Total Bilirubin: 0.58 mg/dL (ref 0.20–1.20)
Total Protein: 6.4 g/dL (ref 6.4–8.3)

## 2013-07-04 LAB — CBC WITH DIFFERENTIAL/PLATELET
BASO%: 0.2 % (ref 0.0–2.0)
EOS%: 1 % (ref 0.0–7.0)
HCT: 30.3 % — ABNORMAL LOW (ref 34.8–46.6)
LYMPH%: 55.3 % — ABNORMAL HIGH (ref 14.0–49.7)
MCH: 36.2 pg — ABNORMAL HIGH (ref 25.1–34.0)
MCHC: 34.7 g/dL (ref 31.5–36.0)
MCV: 104.5 fL — ABNORMAL HIGH (ref 79.5–101.0)
MONO#: 0.2 10*3/uL (ref 0.1–0.9)
MONO%: 5.4 % (ref 0.0–14.0)
NEUT%: 38.1 % — ABNORMAL LOW (ref 38.4–76.8)
Platelets: 37 10*3/uL — ABNORMAL LOW (ref 145–400)
WBC: 4.1 10*3/uL (ref 3.9–10.3)

## 2013-07-04 LAB — LACTATE DEHYDROGENASE (CC13): LDH: 285 U/L — ABNORMAL HIGH (ref 125–245)

## 2013-07-04 NOTE — Patient Instructions (Addendum)
Continue leukeran (chlorambucil) chemo pills 2 daily = 4 mg Continue allopurinol for 2 more weeks then stop Stop AMICAR now Stay on all your other meds Next Antibody treatment:  This Wednesday, October 8 then: 10/22, 11/19, 12/17,10/12/13, 11/09/13

## 2013-07-04 NOTE — Telephone Encounter (Signed)
gv pt appt schedule for October and November. Staff message to Ottis Stain to see if he wants to keep 10/31 f/u and add 11/3.

## 2013-07-04 NOTE — Progress Notes (Signed)
Hematology and Oncology Follow Up Visit  [name] [id number] 15-Nov-1933 79 y.o. 07/04/2013 5:55 PM   Principle Diagnosis: Encounter Diagnoses  Name Primary?  . Low grade malignant lymphoma Yes  . CLL (chronic lymphoid leukemia) in relapse   . Thrombocytopenia, unspecified      Interim History:   Short interval followup visit for this 79 year old woman with multiply relapsed well-differentiated lymphocytic lymphoma/CLL. See  recent summary note dated September 17  for full details of previous diagnoses and treatment. Blood counts began to deteriorate again rapidly at the beginning of August. She had a brief trial of ibrutinib stopped after just 3 weeks for profound fatigue and rapidly falling platelet count which got down to 3000 by September 12. She was started on Amicar. She received daily platelet transfusions with poor response and HLA platelets were obtained. I started her on a salvage regimen with one of the new anti-CD20 antibodies, Obinutuzumab along with daily, oral, chlorambucil at a dose of 4 mg. Leukeran started on September 22. First dose of the Obinutuzumab started on September 24. She has had nothing short of a miraculous early response with a rise in her platelet count up to 42,000, a reversal of her white count differential with a fall and lymphocytes from 80% of the differential down to 55% and rise in her neutrophil percentage up to 40%. She is tolerating both drugs well. No infusion reactions with the Obinutuzumab. No GI toxicity with the chlorambucil. Hair is starting to thin. She has had no interim fevers or infection.   Medications: reviewed  Allergies:  Allergies  Allergen Reactions  . Latex   . Penicillins     Review of Systems: Constitutional:   See above HEENT no sore throat Respiratory: No cough or dyspnea Cardiovascular:  No chest pain or palpitations Gastrointestinal: No change in bowel habit Genito-Urinary: Not questioned Musculoskeletal: No  muscle bone or joint pain Neurologic: She continues to have episodes of vertigo which are primarily positional Skin: No change in chronic dermatitis  Remaining ROS negative.    Physical Exam: Blood pressure 167/68, pulse 74, temperature 98.1 F (36.7 C), temperature source Oral, resp. rate 18, height 5\' 2"  (1.575 m), weight 257 lb 4.8 oz (116.711 kg). Wt Readings from Last 3 Encounters:  07/04/13 257 lb 4.8 oz (116.711 kg)  06/24/13 264 lb 8 oz (119.976 kg)  06/15/13 260 lb 8 oz (118.162 kg)     General appearance: Obese African American woman HENNT: Pharynx no erythema, exudate, or ulcer Lymph nodes: Complete regression of previously palpable left axillary lymph node Breasts: Left mastectomy Lungs: Clear to auscultation resonant to percussion Heart: Regular rhythm 1-2/6 systolic murmur left sternal border Abdomen: Soft, obese, nontender, no gross organomegaly Extremities: No edema, no calf tenderness Musculoskeletal: No joint deformities GU: Vascular: No carotid bruits, no cyanosis Neurologic: Mental status intact, motor strength 5 over 5, cranial nerves grossly normal, reflexes absent symmetric at the knees, 1+ symmetric at the biceps Skin: Chronic dermatitis on her arms with hyperpigmented circular lesions which have faded recently  Lab Results: CBC W/Diff    Component Value Date/Time   WBC 4.1 07/04/2013 1507   WBC 10.6* 06/11/2013 0810   RBC 2.90* 07/04/2013 1507   RBC 1.95* 06/11/2013 0810   HGB 10.5* 07/04/2013 1507   HGB 7.7* 06/11/2013 0810   HCT 30.3* 07/04/2013 1507   HCT 22.2* 06/11/2013 0810   PLT 37* 07/04/2013 1507   PLT <5* 06/11/2013 0810   MCV 104.5* 07/04/2013 1507  MCV 113.8* 06/11/2013 0810   MCH 36.2* 07/04/2013 1507   MCH 39.5* 06/11/2013 0810   MCHC 34.7 07/04/2013 1507   MCHC 34.7 06/11/2013 0810   RDW 20.3* 07/04/2013 1507   RDW 15.5 06/11/2013 0810   LYMPHSABS 2.3 07/04/2013 1507   LYMPHSABS 6.2* 06/11/2013 0810   MONOABS 0.2 07/04/2013 1507   MONOABS  0.3 06/11/2013 0810   EOSABS 0.0 07/04/2013 1507   EOSABS 0.0 06/11/2013 0810   BASOSABS 0.0 07/04/2013 1507   BASOSABS 0.0 06/11/2013 0810     Chemistry      Component Value Date/Time   NA 136 07/04/2013 1509   NA 138 05/27/2013 1226   NA 142 01/01/2011   K 3.9 07/04/2013 1509   K 4.2 05/27/2013 1226   CL 105 05/27/2013 1226   CL 107 02/25/2013 1028   CO2 21* 07/04/2013 1509   CO2 25 05/27/2013 1226   BUN 19.5 07/04/2013 1509   BUN 17 05/27/2013 1226   BUN 16 01/01/2011   CREATININE 1.3* 07/04/2013 1509   CREATININE 1.26* 05/27/2013 1226   CREATININE 0.9 01/01/2011   GLU 104 01/01/2011      Component Value Date/Time   CALCIUM 9.5 07/04/2013 1509   CALCIUM 9.8 05/27/2013 1226   ALKPHOS 82 07/04/2013 1509   ALKPHOS 76 05/27/2013 1226   AST 40* 07/04/2013 1509   AST 28 05/27/2013 1226   ALT 57* 07/04/2013 1509   ALT 24 05/27/2013 1226   BILITOT 0.58 07/04/2013 1509   BILITOT 0.5 05/27/2013 1226       Impression: #1. Multiply relapsed well-differentiated lymphocytic lymphoma/CLL Encouraging early response to combination of Obinutuzumab plus chlorambucil. She'll receive her second dose of the antibody this week on October 8. Continue the chlorambucil at current dose 4 mg daily for now but anticipate dose reduction down to 2 mg after the first month. I will stop her Amicar. Continue allopurinol for another 2 weeks.  #2. Multifocal invasive and noninvasive cancer left breast status post mastectomy 10/09/2010 currently on tamoxifen.  #3. Essential hypertension  #4. Recurrent atypical neurologic symptoms with dizziness, vertigo, paresthesias.  Most recent MRI brain done August 8 shows a remote infarct in the left thalamus. Mild changes in small vessels         Levert Feinstein, MD 10/6/20145:55 PM

## 2013-07-05 ENCOUNTER — Other Ambulatory Visit: Payer: Self-pay | Admitting: Nurse Practitioner

## 2013-07-05 ENCOUNTER — Other Ambulatory Visit: Payer: Self-pay | Admitting: Oncology

## 2013-07-05 DIAGNOSIS — C9112 Chronic lymphocytic leukemia of B-cell type in relapse: Secondary | ICD-10-CM

## 2013-07-05 DIAGNOSIS — C50919 Malignant neoplasm of unspecified site of unspecified female breast: Secondary | ICD-10-CM

## 2013-07-06 ENCOUNTER — Emergency Department (HOSPITAL_COMMUNITY): Payer: Medicare Other

## 2013-07-06 ENCOUNTER — Other Ambulatory Visit: Payer: Self-pay

## 2013-07-06 ENCOUNTER — Ambulatory Visit (HOSPITAL_BASED_OUTPATIENT_CLINIC_OR_DEPARTMENT_OTHER): Payer: Medicare Other

## 2013-07-06 ENCOUNTER — Encounter (HOSPITAL_COMMUNITY): Payer: Self-pay | Admitting: Emergency Medicine

## 2013-07-06 ENCOUNTER — Observation Stay (HOSPITAL_COMMUNITY)
Admission: EM | Admit: 2013-07-06 | Discharge: 2013-07-07 | Disposition: A | Discharge status: Home / Self Care | Payer: Medicare Other | Attending: Internal Medicine | Admitting: Internal Medicine

## 2013-07-06 ENCOUNTER — Other Ambulatory Visit (HOSPITAL_BASED_OUTPATIENT_CLINIC_OR_DEPARTMENT_OTHER): Payer: Medicare Other | Admitting: Lab

## 2013-07-06 VITALS — BP 143/66 | HR 85 | Temp 96.8°F | Resp 18

## 2013-07-06 DIAGNOSIS — C9112 Chronic lymphocytic leukemia of B-cell type in relapse: Secondary | ICD-10-CM

## 2013-07-06 DIAGNOSIS — D696 Thrombocytopenia, unspecified: Secondary | ICD-10-CM

## 2013-07-06 DIAGNOSIS — D630 Anemia in neoplastic disease: Secondary | ICD-10-CM | POA: Insufficient documentation

## 2013-07-06 DIAGNOSIS — C8590 Non-Hodgkin lymphoma, unspecified, unspecified site: Secondary | ICD-10-CM

## 2013-07-06 DIAGNOSIS — T8090XD Unspecified complication following infusion and therapeutic injection, subsequent encounter: Secondary | ICD-10-CM

## 2013-07-06 DIAGNOSIS — Z9010 Acquired absence of unspecified breast and nipple: Secondary | ICD-10-CM | POA: Insufficient documentation

## 2013-07-06 DIAGNOSIS — T451X5A Adverse effect of antineoplastic and immunosuppressive drugs, initial encounter: Secondary | ICD-10-CM | POA: Insufficient documentation

## 2013-07-06 DIAGNOSIS — R42 Dizziness and giddiness: Secondary | ICD-10-CM | POA: Insufficient documentation

## 2013-07-06 DIAGNOSIS — C8599 Non-Hodgkin lymphoma, unspecified, extranodal and solid organ sites: Secondary | ICD-10-CM | POA: Insufficient documentation

## 2013-07-06 DIAGNOSIS — Z5112 Encounter for antineoplastic immunotherapy: Secondary | ICD-10-CM

## 2013-07-06 DIAGNOSIS — E785 Hyperlipidemia, unspecified: Secondary | ICD-10-CM | POA: Insufficient documentation

## 2013-07-06 DIAGNOSIS — R413 Other amnesia: Secondary | ICD-10-CM | POA: Insufficient documentation

## 2013-07-06 DIAGNOSIS — F411 Generalized anxiety disorder: Secondary | ICD-10-CM | POA: Insufficient documentation

## 2013-07-06 DIAGNOSIS — C9110 Chronic lymphocytic leukemia of B-cell type not having achieved remission: Secondary | ICD-10-CM | POA: Diagnosis present

## 2013-07-06 DIAGNOSIS — E669 Obesity, unspecified: Secondary | ICD-10-CM | POA: Insufficient documentation

## 2013-07-06 DIAGNOSIS — I10 Essential (primary) hypertension: Secondary | ICD-10-CM

## 2013-07-06 DIAGNOSIS — R0789 Other chest pain: Principal | ICD-10-CM | POA: Insufficient documentation

## 2013-07-06 DIAGNOSIS — R0609 Other forms of dyspnea: Secondary | ICD-10-CM | POA: Insufficient documentation

## 2013-07-06 DIAGNOSIS — R29898 Other symptoms and signs involving the musculoskeletal system: Secondary | ICD-10-CM | POA: Insufficient documentation

## 2013-07-06 DIAGNOSIS — C9192 Lymphoid leukemia, unspecified, in relapse: Secondary | ICD-10-CM

## 2013-07-06 DIAGNOSIS — Z79899 Other long term (current) drug therapy: Secondary | ICD-10-CM | POA: Insufficient documentation

## 2013-07-06 DIAGNOSIS — T8089XA Other complications following infusion, transfusion and therapeutic injection, initial encounter: Secondary | ICD-10-CM

## 2013-07-06 DIAGNOSIS — T8090XA Unspecified complication following infusion and therapeutic injection, initial encounter: Secondary | ICD-10-CM

## 2013-07-06 DIAGNOSIS — I69998 Other sequelae following unspecified cerebrovascular disease: Secondary | ICD-10-CM | POA: Insufficient documentation

## 2013-07-06 DIAGNOSIS — R0989 Other specified symptoms and signs involving the circulatory and respiratory systems: Secondary | ICD-10-CM | POA: Insufficient documentation

## 2013-07-06 DIAGNOSIS — R079 Chest pain, unspecified: Secondary | ICD-10-CM

## 2013-07-06 DIAGNOSIS — Z853 Personal history of malignant neoplasm of breast: Secondary | ICD-10-CM | POA: Insufficient documentation

## 2013-07-06 DIAGNOSIS — R6889 Other general symptoms and signs: Secondary | ICD-10-CM | POA: Insufficient documentation

## 2013-07-06 LAB — BASIC METABOLIC PANEL WITH GFR
BUN: 17 mg/dL (ref 6–23)
CO2: 20 meq/L (ref 19–32)
Calcium: 9.3 mg/dL (ref 8.4–10.5)
Chloride: 99 meq/L (ref 96–112)
Creatinine, Ser: 0.96 mg/dL (ref 0.50–1.10)
GFR calc Af Amer: 64 mL/min — ABNORMAL LOW
GFR calc non Af Amer: 56 mL/min — ABNORMAL LOW
Glucose, Bld: 260 mg/dL — ABNORMAL HIGH (ref 70–99)
Potassium: 4.1 meq/L (ref 3.5–5.1)
Sodium: 134 meq/L — ABNORMAL LOW (ref 135–145)

## 2013-07-06 LAB — CBC WITH DIFFERENTIAL/PLATELET
Basophils Absolute: 0 10*3/uL (ref 0.0–0.1)
HCT: 29.7 % — ABNORMAL LOW (ref 34.8–46.6)
HGB: 10.2 g/dL — ABNORMAL LOW (ref 11.6–15.9)
MONO#: 0.3 10*3/uL (ref 0.1–0.9)
NEUT%: 32.4 % — ABNORMAL LOW (ref 38.4–76.8)
WBC: 4.1 10*3/uL (ref 3.9–10.3)
lymph#: 2.4 10*3/uL (ref 0.9–3.3)

## 2013-07-06 LAB — POCT I-STAT TROPONIN I: Troponin i, poc: 0.01 ng/mL (ref 0.00–0.08)

## 2013-07-06 LAB — TROPONIN I
Troponin I: <0.3 ng/mL
Troponin I: <0.3 ng/mL (ref <0.30)

## 2013-07-06 MED ORDER — MECLIZINE HCL 12.5 MG PO TABS
12.5000 mg | ORAL_TABLET | Freq: Two times a day (BID) | ORAL | Status: DC | PRN
  Filled 2013-07-06: qty 1

## 2013-07-06 MED ORDER — SODIUM CHLORIDE 0.9 % IJ SOLN
10.0000 mL | INTRAMUSCULAR | Status: DC | PRN
  Administered 2013-07-07: 10 mL

## 2013-07-06 MED ORDER — LORAZEPAM 0.5 MG PO TABS: 0.5000 mg | ORAL_TABLET | Freq: Once | ORAL | Status: AC | PRN

## 2013-07-06 MED ORDER — SULFAMETHOXAZOLE-TMP DS 800-160 MG PO TABS
1.0000 | ORAL_TABLET | ORAL | Status: DC
  Filled 2013-07-06 (×2): qty 1

## 2013-07-06 MED ORDER — ACETAMINOPHEN 325 MG PO TABS: 650.0000 mg | ORAL_TABLET | Freq: Four times a day (QID) | ORAL | Status: DC | PRN

## 2013-07-06 MED ORDER — HYDRALAZINE HCL 20 MG/ML IJ SOLN
10.0000 mg | Freq: Four times a day (QID) | INTRAMUSCULAR | Status: DC | PRN
  Administered 2013-07-06: 10 mg via INTRAVENOUS
  Filled 2013-07-06: qty 1

## 2013-07-06 MED ORDER — PANTOPRAZOLE SODIUM 40 MG PO TBEC
40.0000 mg | DELAYED_RELEASE_TABLET | Freq: Every day | ORAL | Status: DC
  Administered 2013-07-07: 09:00:00 40 mg via ORAL
  Filled 2013-07-06: qty 1

## 2013-07-06 MED ORDER — DEXAMETHASONE SODIUM PHOSPHATE 20 MG/5ML IJ SOLN
INTRAMUSCULAR | Status: AC
  Filled 2013-07-06: qty 5

## 2013-07-06 MED ORDER — SODIUM CHLORIDE 0.9 % IJ SOLN
10.0000 mL | INTRAMUSCULAR | Status: DC | PRN
  Administered 2013-07-06: 10 mL
  Filled 2013-07-06: qty 10

## 2013-07-06 MED ORDER — NITROGLYCERIN 2 % TD OINT
1.0000 [in_us] | TOPICAL_OINTMENT | Freq: Once | TRANSDERMAL | Status: AC
  Administered 2013-07-06: 1 [in_us] via TOPICAL
  Filled 2013-07-06: qty 30

## 2013-07-06 MED ORDER — ACYCLOVIR 400 MG PO TABS
400.0000 mg | ORAL_TABLET | Freq: Every morning | ORAL | Status: DC
  Administered 2013-07-07: 09:00:00 400 mg via ORAL
  Filled 2013-07-06: qty 1

## 2013-07-06 MED ORDER — DIPHENHYDRAMINE HCL 50 MG/ML IJ SOLN
INTRAMUSCULAR | Status: AC
  Filled 2013-07-06: qty 1

## 2013-07-06 MED ORDER — ONDANSETRON HCL 4 MG/2ML IJ SOLN: 4.0000 mg | Freq: Four times a day (QID) | INTRAMUSCULAR | Status: DC | PRN

## 2013-07-06 MED ORDER — SODIUM CHLORIDE 0.9 % IV SOLN
1000.0000 mg | Freq: Once | INTRAVENOUS | Status: AC
  Administered 2013-07-06: 1000 mg via INTRAVENOUS
  Filled 2013-07-06: qty 40

## 2013-07-06 MED ORDER — SODIUM CHLORIDE 0.9 % IJ SOLN: 10.0000 mL | Freq: Two times a day (BID) | INTRAMUSCULAR | Status: DC

## 2013-07-06 MED ORDER — DONEPEZIL HCL 5 MG PO TABS
5.0000 mg | ORAL_TABLET | Freq: Every day | ORAL | Status: DC
  Administered 2013-07-06: 5 mg via ORAL
  Filled 2013-07-06 (×2): qty 1

## 2013-07-06 MED ORDER — ATORVASTATIN CALCIUM 10 MG PO TABS
10.0000 mg | ORAL_TABLET | Freq: Every day | ORAL | Status: DC
  Administered 2013-07-07: 10 mg via ORAL
  Filled 2013-07-06 (×2): qty 1

## 2013-07-06 MED ORDER — DEXAMETHASONE SODIUM PHOSPHATE 20 MG/5ML IJ SOLN
20.0000 mg | Freq: Once | INTRAMUSCULAR | Status: AC
  Administered 2013-07-06: 20 mg via INTRAVENOUS

## 2013-07-06 MED ORDER — SODIUM CHLORIDE 0.9 % IV SOLN
INTRAVENOUS | Status: DC
  Administered 2013-07-06 – 2013-07-07 (×2): via INTRAVENOUS

## 2013-07-06 MED ORDER — HYDRALAZINE HCL 20 MG/ML IJ SOLN
10.0000 mg | INTRAMUSCULAR | Status: AC
  Administered 2013-07-06: 10 mg via INTRAVENOUS
  Filled 2013-07-06: qty 1

## 2013-07-06 MED ORDER — DIPHENHYDRAMINE HCL 50 MG/ML IJ SOLN
50.0000 mg | Freq: Once | INTRAMUSCULAR | Status: AC
  Administered 2013-07-06: 50 mg via INTRAVENOUS

## 2013-07-06 MED ORDER — GABAPENTIN 300 MG PO CAPS
300.0000 mg | ORAL_CAPSULE | Freq: Every day | ORAL | Status: DC
  Filled 2013-07-06: qty 1

## 2013-07-06 MED ORDER — HEPARIN SOD (PORK) LOCK FLUSH 100 UNIT/ML IV SOLN
500.0000 [IU] | Freq: Once | INTRAVENOUS | Status: AC | PRN
  Administered 2013-07-06: 500 [IU]
  Filled 2013-07-06: qty 5

## 2013-07-06 MED ORDER — TAMOXIFEN CITRATE 10 MG PO TABS
20.0000 mg | ORAL_TABLET | Freq: Every day | ORAL | Status: DC
  Administered 2013-07-07: 09:00:00 20 mg via ORAL
  Filled 2013-07-06: qty 2

## 2013-07-06 MED ORDER — MORPHINE SULFATE 2 MG/ML IJ SOLN
2.0000 mg | INTRAMUSCULAR | Status: DC | PRN
  Administered 2013-07-06: 2 mg via INTRAVENOUS
  Filled 2013-07-06: qty 1

## 2013-07-06 MED ORDER — NITROGLYCERIN 0.4 MG SL SUBL
0.4000 mg | SUBLINGUAL_TABLET | SUBLINGUAL | Status: DC | PRN
  Administered 2013-07-06: 0.4 mg via SUBLINGUAL
  Filled 2013-07-06: qty 25

## 2013-07-06 MED ORDER — ACETAMINOPHEN 325 MG PO TABS
ORAL_TABLET | ORAL | Status: AC
  Filled 2013-07-06: qty 2

## 2013-07-06 MED ORDER — ACETAMINOPHEN 325 MG PO TABS
650.0000 mg | ORAL_TABLET | Freq: Once | ORAL | Status: AC
  Administered 2013-07-06: 650 mg via ORAL

## 2013-07-06 MED ORDER — FOLIC ACID 1 MG PO TABS
1.0000 mg | ORAL_TABLET | Freq: Every day | ORAL | Status: DC
  Administered 2013-07-07: 1 mg via ORAL
  Filled 2013-07-06: qty 1

## 2013-07-06 MED ORDER — NITROGLYCERIN 0.4 MG SL SUBL
0.4000 mg | SUBLINGUAL_TABLET | SUBLINGUAL | Status: DC | PRN
  Administered 2013-07-06 (×2): 0.4 mg via SUBLINGUAL

## 2013-07-06 MED ORDER — ALLOPURINOL 300 MG PO TABS
300.0000 mg | ORAL_TABLET | Freq: Every day | ORAL | Status: DC
  Administered 2013-07-07: 09:00:00 300 mg via ORAL
  Filled 2013-07-06: qty 1

## 2013-07-06 MED ORDER — ATENOLOL 50 MG PO TABS
50.0000 mg | ORAL_TABLET | Freq: Every day | ORAL | Status: DC
  Administered 2013-07-06: 50 mg via ORAL
  Filled 2013-07-06 (×2): qty 1

## 2013-07-06 MED ORDER — NITROGLYCERIN 0.4 MG SL SUBL
SUBLINGUAL_TABLET | SUBLINGUAL | Status: AC
  Filled 2013-07-06: qty 25

## 2013-07-06 MED ORDER — SODIUM CHLORIDE 0.9 % IV SOLN
Freq: Once | INTRAVENOUS | Status: AC
  Administered 2013-07-06: 09:00:00 via INTRAVENOUS

## 2013-07-06 MED ORDER — METHYLPREDNISOLONE SODIUM SUCC 40 MG IJ SOLR
40.0000 mg | Freq: Four times a day (QID) | INTRAMUSCULAR | Status: AC
  Administered 2013-07-07: 40 mg via INTRAVENOUS
  Filled 2013-07-06 (×2): qty 1

## 2013-07-06 NOTE — Progress Notes (Signed)
1410-I was called to lobby to assess Kazuko  who was experiencing chest pain and sob.She finished Gazyva at 1326 and was waiting for her transportation. Pt looked pale - BP 220/83 and pulse  120. resp 20. Oxygen sat 100% on room air.Oxygen 2 liters [place]  applied.  Pt assisted to wheelchair and transported to ED and roomed. . While in ED ,Niasha stated she had pain under her right shoulder in the back.She had some flushing or rash around her port site and across her ant chest. She said her voice was different . Report given to ED nurse.

## 2013-07-06 NOTE — Patient Instructions (Signed)
[facility] Discharge Instructions for Patients Receiving Chemotherapy  Today you received the following chemotherapy agents gazyva.  To help prevent nausea and vomiting after your treatment, we encourage you to take your nausea medication compazine.   If you develop nausea and vomiting that is not controlled by your nausea medication, call the clinic.   BELOW ARE SYMPTOMS THAT SHOULD BE REPORTED IMMEDIATELY:  *FEVER GREATER THAN 100.5 F  *CHILLS WITH OR WITHOUT FEVER  NAUSEA AND VOMITING THAT IS NOT CONTROLLED WITH YOUR NAUSEA MEDICATION  *UNUSUAL SHORTNESS OF BREATH  *UNUSUAL BRUISING OR BLEEDING  TENDERNESS IN MOUTH AND THROAT WITH OR WITHOUT PRESENCE OF ULCERS  *URINARY PROBLEMS  *BOWEL PROBLEMS  UNUSUAL RASH Items with * indicate a potential emergency and should be followed up as soon as possible.  Feel free to call the clinic you have any questions or concerns. The clinic phone number is [phone].

## 2013-07-06 NOTE — Progress Notes (Signed)
Shift event: RN paged this NP secondary to pt having chest pain. EKG done and O2 applied. NP to bedside. HPI, brief: Pt was at cancer center today having a chemo tx. Afterwhich, it was thought she had a transfusion reaction. She was brought to ED and admitted.  S: Pt says her chest pain started around 2pm today. She says it is located in the right posterior neck, right anterior cervical area, right chest (where her port is), and under the left breast (chronic from mastectomy). She describes pain as pressure and sharp. She says her pain has decreased to a tolerable level after admission and just returned to a 8/10 about 45 mins ago. She is "not really" SOB. She is confusing with her hx telling. When I probe more about her pain, she says she has a lipoma on her right posterior neck and this causes her neck to hurt even at home. She says she began feeling bad this am when the cancer center disconnected her port. She had no chest pain until 2pm today.  O: Well appearing obese AAF in NAD. She is alert and oriented, appropriate in speech and actions. She converses without SOB, actually joking somewhat. HEENT: [place], AT. About a 5cm soft, fatty tumor to right posterior neck. No anterior cervical lymphadenopathy. CARD: S1S2, RRR. 4/6 murmur. RESP: normal effort, no increased WOB. Lungs CTA. No stridor. PSYCH: slightly anxious A/P: 1. Chest pain s/p transfusion reaction after chemo-this appears to be atypical chest pain. No SOB, n/v, or diaphoresis. EKG appears same as 2008. Troponins since admission have been neg. Will cycle 2 more tonight. O2 applied although not desatting. Will leave on for comfort tonight. Can not give her ASA secondary to low platelet count. Pain down to 4/10. Ordered Morphine, but she is reluctant to take it.  2. HTN-BP high, have given Hydralazine twice and she has had 2 NTG tabs SL. May need to apply NTG ointment if doesn't lower with 2nd dose of Hydralazine. This may be contributing to her chest  pain.  3. Anxiety-will order low dose Ativan if she will take.  Will cont to follow tonight. Follow trops. If elevated, will call cardio and r/p EKG in 4 hours.  Jimmye Norman, NP [facility]

## 2013-07-06 NOTE — H&P (Signed)
[facility] History and Physical  [name] ZOX:[id number] DOB: May 24, 1934 DOA: 07/06/2013  Referring physician: EDP PCP: Rene Paci, MD  Specialists: Onc Dr.Granfortuna  Chief Complaint: chest pressure, Dyspnea, throat tightness  HPI: [name] is a 79 y.o. female with relapsed well-differentiated lymphocytic lymphoma/CLL, H/o Breast CA , vertigo  Was started on a Salvage chemo regimen  with one of the new anti-CD20 antibodies, Obinutuzumab along with daily, oral, chlorambucil on September 22. First dose of the Obinutuzumab started on September 24.  She had a very good response to this with improvements in her counts and without significant side effects. Today at the cancer center she received her second dose of Obinutuzumab this afternoon and within 1-2hours started experiencing shortness of breath, some chest pressure, flushing, slight redness across her upper chest, sensation of tightness in her throat and was brought to the ER. With supportive care, nebs she noted improvement in her symptoms and [facility] was consulted for obs  Review of Systems: The patient denies anorexia, fever, weight loss,, vision loss, decreased hearing, hoarseness, chest pain, syncope, dyspnea on exertion, peripheral edema, balance deficits, hemoptysis, abdominal pain, melena, hematochezia, severe indigestion/heartburn, hematuria, incontinence, genital sores, muscle weakness, suspicious skin lesions, transient blindness, difficulty walking, depression, unusual weight change, abnormal bleeding, enlarged lymph nodes, angioedema, and breast masses.    Past Medical History  Diagnosis Date  . 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 Stroke 05/2009    05/2009  Right upper extrem/leg weakness; nuchal pain;  . Benign essential HTN   . Macrocytic anemia   . Vertigo     ?TIA - MRI brain 5/13  No new  CVA  . Pleurisy     01/16/12 pleuritic right chest pain  V/Q scan [place] [facility] low probability PE  . Breast cancer left T2N0 S/P mastectomy and SLN Bx 09/2010 2009  . CLL (chronic lymphoid leukemia) in relapse   . Arthritis   . Hyperlipidemia   . Thrombocytopenia, unspecified 06/15/2013   Past Surgical History  Procedure Laterality Date  . Tonsillectomy    . Abdominal hysterectomy    . Right parotid gland    . Hemorrhoid surgery    . Breast biopsy Left 2009  . Mastectomy  02/ 2012    left breast  . Porta-cath      right chest   Social History:  reports that she has never smoked. She has never used smokeless tobacco. She reports that she does not drink alcohol or use illicit drugs. Lives at home alone, independent in ADLs  Allergies  Allergen Reactions  . Latex   . Penicillins     Family History  Problem Relation Age of Onset  . Breast cancer Mother   . Hyperlipidemia    . Stroke    . Hypertension    . Arthritis      Prior to Admission medications   Medication Sig Start Date End Date Taking? Authorizing Provider  acetaminophen (TYLENOL) 500 MG tablet Take 500 mg by mouth every 6 (six) hours as needed. For pain   Yes Historical Provider, MD  acyclovir (ZOVIRAX) 400 MG tablet TAKE 1 TABLET BY MOUTH DAILY. 07/05/13  Yes Levert Feinstein, MD  allopurinol (ZYLOPRIM) 300 MG tablet Take 1 tablet (300 mg total) by mouth daily. 06/15/13 07/15/13 Yes Levert Feinstein, MD  atenolol (TENORMIN) 50 MG tablet Take 50 mg by mouth  at bedtime and may repeat dose one time if needed.  09/16/11  Yes Historical Provider, MD  atorvastatin (LIPITOR) 10 MG tablet Take 1 tablet (10 mg total) by mouth daily. 02/01/13  Yes Newt Lukes, MD  calcium carbonate (TUMS - DOSED IN MG ELEMENTAL CALCIUM) 500 MG chewable tablet Chew 1 tablet by mouth as needed. For stomach   Yes Historical Provider, MD  chlorambucil (LEUKERAN) 2 MG tablet Take 4 mg by mouth daily. Give on an empty stomach 1 hour  before or 2 hours after meals. 06/20/13 07/18/13 Yes Historical Provider, MD  clotrimazole (MYCELEX) 10 MG troche Take 10 mg by mouth as needed.   Yes Historical Provider, MD  donepezil (ARICEPT) 5 MG tablet TAKE 1 TABLET (5 MG TOTAL) BY MOUTH AT BEDTIME. 06/12/13  Yes Nicki Reaper, NP  folic acid (FOLVITE) 1 MG tablet Take 1 mg by mouth daily.   Yes Historical Provider, MD  gabapentin (NEURONTIN) 300 MG capsule 300 mg daily.  09/17/11  Yes Historical Provider, MD  hydrochlorothiazide (MICROZIDE) 12.5 MG capsule Take 1 capsule (12.5 mg total) by mouth daily. 01/23/12  Yes Rana Snare, NP  lidocaine-prilocaine (EMLA) cream APPLY TOPICALLY AS NEEDED. APPLY TO PAC 1 HOUR PRIOR TO PROCEDURE. DO NOT RUB IN. 06/23/13  Yes Levert Feinstein, MD  meclizine (ANTIVERT) 12.5 MG tablet TAKE 1 TABLET BY MOUTH 3 TIMES DAILY AS NEEDED FOR DIZZINESS. 07/05/13  Yes Levert Feinstein, MD  pantoprazole (PROTONIX) 40 MG tablet TAKE 1 TABLET EVERY DAY 07/05/13  Yes Levert Feinstein, MD  potassium chloride SA (K-DUR,KLOR-CON) 20 MEQ tablet Take 1 tablet (20 mEq total) by mouth daily as needed. 07/05/13  Yes Rana Snare, NP  prochlorperazine (COMPAZINE) 10 MG tablet Take 1 tablet (10 mg total) by mouth every 6 (six) hours as needed. For nausea 05/27/13  Yes Levert Feinstein, MD  sulfamethoxazole-trimethoprim (BACTRIM DS) 800-160 MG per tablet TAKE 1 TABLET BY MOUTH EVERY MONDAY, WEDNESDAY, AND FRIDAY. 07/05/13  Yes Levert Feinstein, MD  tamoxifen (NOLVADEX) 20 MG tablet TAKE 1 TABLET (20 MG TOTAL) BY MOUTH DAILY. 06/10/13  Yes Levert Feinstein, MD   Physical Exam: Filed Vitals:   07/06/13 1818  BP: 182/55  Pulse: 91  Temp: 98 F (36.7 C)  Resp: 18     General:  AAOx3  HEENT: PERRLA, mild erythema of skin overlying upper chest, Port noted  Cardiovascular: S1S2/RRR  Respiratory: CTAB  Abdomen: soft, Nt, BS present  Skin: mild upper chest erythema  Musculoskeletal: no edema c/c  Psychiatric:  appropriate mood and affect  Neurologic: non focal  Labs on Admission:  Basic Metabolic Panel:  Recent Labs Lab 07/04/13 1509 07/06/13 1512  NA 136 134*  K 3.9 4.1  CL  --  99  CO2 21* 20  GLUCOSE 127 260*  BUN 19.5 17  CREATININE 1.3* 0.96  CALCIUM 9.5 9.3   Liver Function Tests:  Recent Labs Lab 07/04/13 1509  AST 40*  ALT 57*  ALKPHOS 82  BILITOT 0.58  PROT 6.4  ALBUMIN 3.8   No results found for this basename: LIPASE, AMYLASE,  in the last 168 hours No results found for this basename: AMMONIA,  in the last 168 hours CBC:  Recent Labs Lab 07/01/13 1003 07/04/13 1507 07/06/13 0746  WBC 6.1 4.1 4.1  NEUTROABS 2.4 1.6 1.3*  HGB 9.6* 10.5* 10.2*  HCT 28.1* 30.3* 29.7*  MCV 106.0* 104.5* 106.1*  PLT 40* 37* 34*   Cardiac  Enzymes:  Recent Labs Lab 07/06/13 1512  TROPONINI <0.30    BNP (last 3 results) No results found for this basename: PROBNP,  in the last 8760 hours CBG: No results found for this basename: GLUCAP,  in the last 168 hours  Radiological Exams on Admission: Dg Chest Port 1 View  07/06/2013   CLINICAL DATA:  Short of breath  EXAM: PORTABLE CHEST - 1 VIEW  COMPARISON:  06/15/2012  FINDINGS: Normal heart size. Clear lungs. Stable right internal jugular vein Port-A-Cath. No pneumothorax or pleural effusion. Density over the posterior right 4th rib is stable and likely related to ectatic vasculature appear  IMPRESSION: No active cardiopulmonary disease.   Electronically Signed   By: Maryclare Bean M.D.   On: 07/06/2013 15:35    EKG: Independently reviewed. Non specific T wave changes Assessment/Plan  1. Infusion reaction to Obinutuzumab(Anti CD20 antibodies) suspected -improving, no evidence of stridor, Angioedema at this time. -supportive care, IVf, Solumedrol IV now and in 6 hours -PPI -will cycle cardiac enzymes and monitor on Tele overnight for completeness  2. Relapsed well-differentiated lymphocytic lymphoma/CLL  -on salvage  chemo -Dr.Granfortuna notified of admission -continue prophylactic BActrim and acyclovir  3. Thrombocytopenia/Anemia -from Lymphoma, chemo etc -monitor  4. HTN-uncontrolled -due to 1 -improving, hydralazine PRN -continue atenolol  5. H/o Breast CA -continue tamoxifen  6. Chronic dizziness -meclizine PRN  DVT proph: SCDs  Code Status: Full Code at this time, wants to think about this Family Communication: d/w pt and daughter at bedside Disposition Plan: home tomorrow once improved  Time spent:  [facility] Pager [phone]  If 7PM-7AM, please contact night-coverage [email or web address] Password [facility] 07/06/2013, 6:25 PM

## 2013-07-06 NOTE — ED Notes (Signed)
Nitroglycerin Ont d/ced per Dr Jomarie Longs for infusion reaction

## 2013-07-06 NOTE — ED Notes (Signed)
Patient reports to ED from cancer center, states that after chemo infusion as patient was leaving the lobby patient felt hot, sweaty, then began to have central chest pressure radiating up into central throat and difficulty breathing.

## 2013-07-06 NOTE — ED Notes (Signed)
Bed: WA07 Expected date:  Expected time:  Means of arrival:  Comments: Cancer pt 

## 2013-07-06 NOTE — ED Provider Notes (Signed)
CSN: [id number]     Arrival date & time 07/06/13  1416 History   First MD Initiated Contact with Patient 07/06/13 1425     Chief Complaint  Patient presents with  . Shortness of Breath  . Chest Pain   (Consider location/radiation/quality/duration/timing/severity/associated sxs/prior Treatment) Patient is a 79 y.o. female presenting with shortness of breath and chest pain. The history is provided by the patient.  Shortness of Breath Severity:  Moderate Onset quality:  Sudden Timing:  Constant Progression:  Unchanged Chronicity:  New Context: not URI and not weather changes   Context comment:  Had just finished chemotherapy, has had this chemotherapy twice before without any problem Relieved by:  Nothing Worsened by:  Nothing tried Ineffective treatments:  None tried Associated symptoms: chest pain (pressure in central chest with associated arm numbness)   Associated symptoms: no abdominal pain, no cough, no fever, no hemoptysis, no rash, no vomiting and no wheezing   Chest Pain Associated symptoms: shortness of breath   Associated symptoms: no abdominal pain, no cough, no fever and not vomiting     Past Medical History  Diagnosis Date  . 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 Stroke 05/2009    05/2009  Right upper extrem/leg weakness; nuchal pain;  . Benign essential HTN   . Macrocytic anemia   . Vertigo     ?TIA - MRI brain 5/13  No new CVA  . Pleurisy     01/16/12 pleuritic right chest pain  V/Q scan [place] [facility] low probability PE  . Breast cancer left T2N0 S/P mastectomy and SLN Bx 09/2010 2009  . CLL (chronic lymphoid leukemia) in relapse   . Arthritis   . Hyperlipidemia   . Thrombocytopenia, unspecified 06/15/2013   Past Surgical History  Procedure Laterality Date  . Tonsillectomy    . Abdominal hysterectomy    . Right parotid gland    .  Hemorrhoid surgery    . Breast biopsy Left 2009  . Mastectomy  02/ 2012    left breast  . Porta-cath      right chest   Family History  Problem Relation Age of Onset  . Breast cancer Mother   . Hyperlipidemia    . Stroke    . Hypertension    . Arthritis     History  Substance Use Topics  . Smoking status: Never Smoker   . Smokeless tobacco: Never Used  . Alcohol Use: No   OB History   Grav Para Term Preterm Abortions TAB SAB Ect Mult Living                 Review of Systems  Constitutional: Negative for fever.  Respiratory: Positive for shortness of breath. Negative for cough, hemoptysis and wheezing.   Cardiovascular: Positive for chest pain (pressure in central chest with associated arm numbness). Negative for leg swelling.  Gastrointestinal: Negative for vomiting and abdominal pain.  Skin: Negative for rash.  All other systems reviewed and are negative.    Allergies  Latex and Penicillins  Home Medications   Current Outpatient Rx  Name  Route  Sig  Dispense  Refill  . acetaminophen (TYLENOL) 500 MG tablet   Oral   Take 500 mg by mouth every 6 (six) hours as needed. For pain         . acyclovir (ZOVIRAX) 400 MG tablet  TAKE 1 TABLET BY MOUTH DAILY.   30 tablet   1   . allopurinol (ZYLOPRIM) 300 MG tablet   Oral   Take 1 tablet (300 mg total) by mouth daily.   30 tablet   0   . aminocaproic acid (AMICAR) 500 MG tablet   Oral   Take 4 tablets (2 g total) by mouth every 6 (six) hours.   480 tablet   1   . aspirin 325 MG EC tablet   Oral   Take 325 mg by mouth daily.         Marland Kitchen atenolol (TENORMIN) 50 MG tablet   Oral   Take 50 mg by mouth at bedtime and may repeat dose one time if needed.          Marland Kitchen atorvastatin (LIPITOR) 10 MG tablet   Oral   Take 1 tablet (10 mg total) by mouth daily.   90 tablet   3   . calcium carbonate (TUMS - DOSED IN MG ELEMENTAL CALCIUM) 500 MG chewable tablet   Oral   Chew 1 tablet by mouth as needed.  For stomach         . chlorambucil (LEUKERAN) 2 MG tablet   Oral   Take 4 mg by mouth daily. Give on an empty stomach 1 hour before or 2 hours after meals.         . clotrimazole (MYCELEX) 10 MG troche   Oral   Take 10 mg by mouth as needed.         . donepezil (ARICEPT) 5 MG tablet      TAKE 1 TABLET (5 MG TOTAL) BY MOUTH AT BEDTIME.   30 tablet   3   . folic acid (FOLVITE) 1 MG tablet   Oral   Take 1 mg by mouth daily.         Marland Kitchen gabapentin (NEURONTIN) 300 MG capsule      300 mg daily.          . hydrochlorothiazide (MICROZIDE) 12.5 MG capsule   Oral   Take 1 capsule (12.5 mg total) by mouth daily.   90 capsule   0     FUTURE REFILLS OF THIS MEDICATION SHOULD BE SENT T ...   . lidocaine-prilocaine (EMLA) cream      APPLY TOPICALLY AS NEEDED. APPLY TO PAC 1 HOUR PRIOR TO PROCEDURE. DO NOT RUB IN.   30 g   1   . meclizine (ANTIVERT) 12.5 MG tablet      TAKE 1 TABLET BY MOUTH 3 TIMES DAILY AS NEEDED FOR DIZZINESS.   30 tablet   1   . pantoprazole (PROTONIX) 40 MG tablet      TAKE 1 TABLET EVERY DAY   90 tablet   0   . potassium chloride SA (K-DUR,KLOR-CON) 20 MEQ tablet   Oral   Take 1 tablet (20 mEq total) by mouth daily as needed.   30 tablet   3   . prochlorperazine (COMPAZINE) 10 MG tablet   Oral   Take 1 tablet (10 mg total) by mouth every 6 (six) hours as needed. For nausea   30 tablet   4   . sulfamethoxazole-trimethoprim (BACTRIM DS) 800-160 MG per tablet      TAKE 1 TABLET BY MOUTH EVERY MONDAY, WEDNESDAY, AND FRIDAY.   36 tablet   1   . tamoxifen (NOLVADEX) 20 MG tablet      TAKE 1 TABLET (20 MG TOTAL) BY MOUTH  DAILY.   30 tablet   0    BP 210/83  Pulse 110  Temp(Src) 97.8 F (36.6 C) (Oral)  Resp 18  SpO2 100% Physical Exam  Nursing note and vitals reviewed. Constitutional: She is oriented to person, place, and time. She appears well-developed and well-nourished. No distress.  HENT:  Head: Normocephalic and  atraumatic.  Eyes: EOM are normal. Pupils are equal, round, and reactive to light.  Neck: Normal range of motion. Neck supple.  Cardiovascular: Normal rate and regular rhythm.  Exam reveals no friction rub.   No murmur heard. Pulmonary/Chest: Effort normal and breath sounds normal. No respiratory distress. She has no wheezes. She has no rales.  Abdominal: Soft. She exhibits no distension. There is no tenderness. There is no rebound.  Musculoskeletal: Normal range of motion. She exhibits no edema.  Neurological: She is alert and oriented to person, place, and time.  Skin: She is not diaphoretic.    ED Course  Procedures (including critical care time) Labs Review Labs Reviewed  BASIC METABOLIC PANEL  TROPONIN I   Imaging Review No results found.   Date: 07/06/2013  Rate: 109  Rhythm: sinus tachycardia  QRS Axis: normal  Intervals: normal  ST/T Wave abnormalities: nonspecific T wave changes in V5-V6  Conduction Disutrbances:none  Narrative Interpretation:   Old EKG Reviewed: new nonspecific V5-V6 changes   MDM   1. Chest pain    33F with hx of lymphoma presents with chest pain. Chest pain began after she finished chemotherapy which she's had before. Patient has central chest pressure without radiation. Patient has no associated nausea or vomiting. Mild SOB. No hx of CAD or PE. Chemo through R chest port, easily accessed here with good blood return. Initial EKG with sinus tachycardia, otherwise normal. Improved on nitro. Initial troponin normal. Admitted for obs.      Dagmar Hait, MD 07/06/13 [phone]

## 2013-07-07 ENCOUNTER — Encounter (HOSPITAL_COMMUNITY): Payer: Self-pay | Admitting: *Deleted

## 2013-07-07 DIAGNOSIS — I10 Essential (primary) hypertension: Secondary | ICD-10-CM

## 2013-07-07 DIAGNOSIS — I517 Cardiomegaly: Secondary | ICD-10-CM

## 2013-07-07 DIAGNOSIS — C8589 Other specified types of non-Hodgkin lymphoma, extranodal and solid organ sites: Secondary | ICD-10-CM

## 2013-07-07 DIAGNOSIS — C9112 Chronic lymphocytic leukemia of B-cell type in relapse: Secondary | ICD-10-CM

## 2013-07-07 DIAGNOSIS — C50919 Malignant neoplasm of unspecified site of unspecified female breast: Secondary | ICD-10-CM

## 2013-07-07 LAB — CBC
Hemoglobin: 10 g/dL — ABNORMAL LOW (ref 12.0–15.0)
MCV: 105.6 fL — ABNORMAL HIGH (ref 78.0–100.0)
Platelets: 41 10*3/uL — ABNORMAL LOW (ref 150–400)
RBC: 2.7 MIL/uL — ABNORMAL LOW (ref 3.87–5.11)
WBC: 6.6 10*3/uL (ref 4.0–10.5)

## 2013-07-07 LAB — BASIC METABOLIC PANEL
CO2: 22 mEq/L (ref 19–32)
Calcium: 9 mg/dL (ref 8.4–10.5)
Creatinine, Ser: 0.96 mg/dL (ref 0.50–1.10)
GFR calc Af Amer: 64 mL/min — ABNORMAL LOW (ref >90)
Glucose, Bld: 190 mg/dL — ABNORMAL HIGH (ref 70–99)
Potassium: 4.5 mEq/L (ref 3.5–5.1)
Sodium: 134 mEq/L — ABNORMAL LOW (ref 135–145)

## 2013-07-07 LAB — TROPONIN I
Troponin I: <0.3 ng/mL (ref <0.30)
Troponin I: <0.3 ng/mL (ref <0.30)

## 2013-07-07 MED ORDER — HEPARIN SOD (PORK) LOCK FLUSH 100 UNIT/ML IV SOLN
500.0000 [IU] | INTRAVENOUS | Status: AC | PRN
  Administered 2013-07-07: 12:00:00 500 [IU]

## 2013-07-07 MED ORDER — AMLODIPINE BESYLATE 10 MG PO TABS
10.0000 mg | ORAL_TABLET | Freq: Every day | ORAL | Status: DC
  Administered 2013-07-07: 12:00:00 10 mg via ORAL
  Filled 2013-07-07: qty 1

## 2013-07-07 MED ORDER — AMLODIPINE BESYLATE 10 MG PO TABS: 10.0000 mg | ORAL_TABLET | Freq: Every day | ORAL | Status: AC

## 2013-07-07 MED ORDER — CLONIDINE HCL 0.1 MG PO TABS
0.1000 mg | ORAL_TABLET | Freq: Two times a day (BID) | ORAL | Status: DC
  Filled 2013-07-07 (×2): qty 1

## 2013-07-07 NOTE — Discharge Summary (Signed)
Physician Discharge Summary  [name]:[id number] DOB: 07/20/1934 DOA: 07/06/2013  PCP: Rene Paci, MD  Admit date: 07/06/2013 Discharge date: 07/07/2013  Time spent:  Recommendations for Outpatient Follow-up:  1. Dr. Cyndie Chime next week 2. Dr.Leschber in 7-10days  Discharge Diagnoses:  Principal Problem:   Infusion reaction Active Problems:   Benign essential HTN   CLL (chronic lymphoid leukemia) in relapse   Memory loss   Thrombocytopenia, unspecified   Discharge Condition:improved  Diet recommendation: low sodium  Filed Weights   07/06/13 1818  Weight: 116.711 kg (257 lb 4.8 oz)    History of present illness:  [name] is a 79 y.o. female with relapsed well-differentiated lymphocytic lymphoma/CLL, H/o Breast CA , vertigo Was started on a Salvage chemo regimen  with one of the new anti-CD20 antibodies, Obinutuzumab along with daily, oral, chlorambucil on September 22. First dose of the Obinutuzumab started on September 24.  She had a very good response to this with improvements in her counts and without significant side effects. Today at the cancer center she received her second dose of Obinutuzumab this afternoon and within 1-2hours started experiencing shortness of breath, some chest pressure, flushing, slight redness across her upper chest, sensation of tightness in her throat and was brought to the ER.   Hospital Course:  1. Infusion reaction to Obinutuzumab(Anti CD20 antibodies) suspected  -improving, no evidence of stridor, Angioedema on admission.  -improved supportive care, IVf, Solumedrol IV x 2 doses  -cardiac enzymes negative, no events on the monitor -discharged home next day in stable condition  2. Relapsed well-differentiated lymphocytic lymphoma/CLL  -on salvage chemo  -Dr.Granfortuna notified of admission  -continue prophylactic BActrim and acyclovir  -will FU with Dr.G next week  3. Thrombocytopenia/Anemia  -from  Lymphoma, chemo etc  -monitor   4. HTN-uncontrolled  -despite HCTZ and atenolol, started on Norvasc -required PRN Hydralazine doses overnight  5. H/o Breast CA  -continue tamoxifen  6. Chronic dizziness  -meclizine PRN      Consultations:  Dr.Granfortuna  Discharge Exam: Filed Vitals:   07/07/13 0422  BP: 158/56  Pulse: 74  Temp: 98.6 F (37 C)  Resp: 16    General: AAOx3 Cardiovascular:S1S2/RRR Respiratory: CTAB  Discharge Instructions  Discharge Orders   Future Appointments Provider Department Dept Phone   07/08/2013 10:45 AM Dava Najjar [place] [facility] [phone]   07/15/2013 10:45 AM [facility] [place] [facility] [phone]   07/20/2013 9:30 AM [facility] [phone]   07/20/2013 10:00 AM [facility] [place] [facility] [phone]   07/22/2013 10:45 AM [facility] [place] [facility] [phone]   07/29/2013 12:00 PM Krista Blue [facility] MED[facility] ONCOLOG[facility] [phone]   07/29/2013 12:30 PM Levert Feinstein, MD [facility] MEDICAL ONCOLOGY [phone]   08/01/2013 10:15 AM Mauri [place] [facility] [phone]   08/01/2013 10:45 AM Rana Snare, NP  [facility] [phone]   Future Orders Complete By Expires   Diet - low sodium heart healthy  As directed    Increase activity slowly  As directed        Medication List         acetaminophen 500 MG tablet  Commonly known as:  TYLENOL  Take 500 mg by mouth every 6 (six) hours as needed. For pain  acyclovir 400 MG tablet  Commonly known as:  ZOVIRAX  TAKE 1 TABLET BY MOUTH DAILY.     allopurinol 300 MG tablet  Commonly known as:  ZYLOPRIM  Take 1 tablet (300 mg total) by mouth daily.     amLODipine 10 MG tablet  Commonly  known as:  NORVASC  Take 1 tablet (10 mg total) by mouth daily.     atenolol 50 MG tablet  Commonly known as:  TENORMIN  Take 50 mg by mouth at bedtime and may repeat dose one time if needed.     atorvastatin 10 MG tablet  Commonly known as:  LIPITOR  Take 1 tablet (10 mg total) by mouth daily.     calcium carbonate 500 MG chewable tablet  Commonly known as:  TUMS - dosed in mg elemental calcium  Chew 1 tablet by mouth as needed. For stomach     chlorambucil 2 MG tablet  Commonly known as:  LEUKERAN  Take 4 mg by mouth daily. Give on an empty stomach 1 hour before or 2 hours after meals.     clotrimazole 10 MG troche  Commonly known as:  MYCELEX  Take 10 mg by mouth as needed.     donepezil 5 MG tablet  Commonly known as:  ARICEPT  TAKE 1 TABLET (5 MG TOTAL) BY MOUTH AT BEDTIME.     folic acid 1 MG tablet  Commonly known as:  FOLVITE  Take 1 mg by mouth daily.     gabapentin 300 MG capsule  Commonly known as:  NEURONTIN  300 mg daily.     hydrochlorothiazide 12.5 MG capsule  Commonly known as:  MICROZIDE  Take 1 capsule (12.5 mg total) by mouth daily.     lidocaine-prilocaine cream  Commonly known as:  EMLA  APPLY TOPICALLY AS NEEDED. APPLY TO PAC 1 HOUR PRIOR TO PROCEDURE. DO NOT RUB IN.     meclizine 12.5 MG tablet  Commonly known as:  ANTIVERT  TAKE 1 TABLET BY MOUTH 3 TIMES DAILY AS NEEDED FOR DIZZINESS.     pantoprazole 40 MG tablet  Commonly known as:  PROTONIX  TAKE 1 TABLET EVERY DAY     potassium chloride SA 20 MEQ tablet  Commonly known as:  K-DUR,KLOR-CON  Take 1 tablet (20 mEq total) by mouth daily as needed.     prochlorperazine 10 MG tablet  Commonly known as:  COMPAZINE  Take 1 tablet (10 mg total) by mouth every 6 (six) hours as needed. For nausea     sulfamethoxazole-trimethoprim 800-160 MG per tablet  Commonly known as:  BACTRIM DS  TAKE 1 TABLET BY MOUTH EVERY MONDAY, WEDNESDAY, AND FRIDAY.     tamoxifen 20 MG tablet  Commonly known  as:  NOLVADEX  TAKE 1 TABLET (20 MG TOTAL) BY MOUTH DAILY.       Allergies  Allergen Reactions  . Latex   . Penicillins        Follow-up Information   Follow up with Rene Paci, MD. Schedule an appointment as soon as possible for a visit in 10 days.   Specialty:  Internal Medicine   Contact information:   [address] [place] [address] [phone]        The results of significant diagnostics from this hospitalization (including imaging, microbiology, ancillary and laboratory) are listed below for reference.    Significant Diagnostic Studies: Dg Chest Port 1 View  07/06/2013   CLINICAL DATA:  Short of breath  EXAM: PORTABLE CHEST - 1 VIEW  COMPARISON:  06/15/2012  FINDINGS: Normal heart size. Clear lungs. Stable right internal jugular vein Port-A-Cath. No pneumothorax or pleural effusion. Density over the posterior right 4th rib is stable and likely related to ectatic vasculature appear  IMPRESSION: No active cardiopulmonary disease.   Electronically Signed   By: Maryclare Bean M.D.   On: 07/06/2013 15:35    Microbiology: No results found for this or any previous visit (from the past 240 hour(s)).   Labs: Basic Metabolic Panel:  Recent Labs Lab 07/04/13 1509 07/06/13 1512 07/07/13 0432  NA 136 134* 134*  K 3.9 4.1 4.5  CL  --  99 102  CO2 21* 20 22  GLUCOSE 127 260* 190*  BUN 19.5 17 16   CREATININE 1.3* 0.96 0.96  CALCIUM 9.5 9.3 9.0   Liver Function Tests:  Recent Labs Lab 07/04/13 1509  AST 40*  ALT 57*  ALKPHOS 82  BILITOT 0.58  PROT 6.4  ALBUMIN 3.8   No results found for this basename: LIPASE, AMYLASE,  in the last 168 hours No results found for this basename: AMMONIA,  in the last 168 hours CBC:  Recent Labs Lab 07/01/13 1003 07/04/13 1507 07/06/13 0746 07/07/13 0432  WBC 6.1 4.1 4.1 6.6  NEUTROABS 2.4 1.6 1.3*  --   HGB 9.6* 10.5* 10.2* 10.0*  HCT 28.1* 30.3* 29.7* 28.5*  MCV 106.0* 104.5* 106.1*  105.6*  PLT 40* 37* 34* 41*   Cardiac Enzymes:  Recent Labs Lab 07/06/13 1512 07/06/13 2008 07/06/13 2335 07/07/13 0432  TROPONINI <0.30 <0.30 <0.30 <0.30   BNP: BNP (last 3 results) No results found for this basename: PROBNP,  in the last 8760 hours CBG: No results found for this basename: GLUCAP,  in the last 168 hours     Signed:  Hesper Venturella  [facility] 07/07/2013, 4:53 PM

## 2013-07-07 NOTE — Progress Notes (Signed)
Update: Trops x 2 neg. AM labs look good. BP has improved with medications given earlier. Last BP in the 150s.  Jimmye Norman, NP [facility]

## 2013-07-07 NOTE — Progress Notes (Signed)
Progress Note:    I greatly appreciate hospital medicine service assistance with this patient   Pleasant 79 year old woman well known to me. She presented with rapidly progressive pancytopenia with marked thrombocytopenia in November 2009. A single left axillary lymph node seen on a mammogram was biopsied and showed well-differentiated lymphocytic lymphoma. In addition, there was a lesion in the breast which initially showed intraductal carcinoma, subsequently determined to be an invasive cancer and after she achieved a partial remission of her lymphoma, she underwent a left mastectomy. Please see my office progress note dated September 19 for full details of her initial diagnosis and treatment and recent October 6 note for an update. She was refractory to all initial treatment until she was given a trial of Arzerra, an anti-CD20 antibody (second generation). She achieved a nice partial response to this drug but ultimately progressed again. She was recently started on a salvage regimen with a combination of oral chlorambucil chemotherapy plus a third generation anti-CD20 antibody, Obinutuzumab. She received a test dose on September 24, the remainder of the planned dose on September 25. She tolerated the product well and had an unexpected a prompt response with rise in her platelet count up to 42,000. She was in our office yesterday and received her third dose of this product. She tolerated the drug well until she was leaving the cancer Center and in our lobby when she developed sudden onset of dyspnea then chest pressure across the entire chest radiating down her left arm with some tingling of her right fingers as well. She was taken promptly to the emergency department for further evaluation. Serial troponin levels have been flat. Cardiogram shows sinus tachycardia with some nonspecific ST and T wave changes. She has chronic hypertension and blood pressures were running high yesterday. She received  hydralazine when peak blood pressure reached 194/70. Her symptoms subsided with administration of oxygen. She had a recurrent episode of chest pressure and pain last evening relieved by morphine and nitroglycerin. She is asymptomatic this morning. She has no prior cardiac history. An echocardiogram done 06/13/2010 showed mild concentric ventricular hypertrophy with estimated ejection fraction 65-70%. She recently established with Dr. Felicity Coyer.     Vitals: Filed Vitals:   07/07/13 0422  BP: 158/56  Pulse: 74  Temp: 98.6 F (37 C)  Resp: 16   Wt Readings from Last 3 Encounters:  07/06/13 257 lb 4.8 oz (116.711 kg)  07/04/13 257 lb 4.8 oz (116.711 kg)  06/24/13 264 lb 8 oz (119.976 kg)     PHYSICAL EXAM:  General  obese African American woman in no distress Head: normal Eyes: normal Throat: no erythema or exudate Neck: Lymph Nodes: resolved left axillary adenopathy Lungs: clear to auscultation resonant to percussion Breasts:  Left mastectomy Cardiac: regular rhythm 1-2/6 systolic murmur left sternal border Abdominal:  Soft, obese nontender no obvious mass or organomegaly Extremities: no edema, no calf tenderness Vascular:  No cyanosis Neurologic grossly normal. She is awake, alert, and oriented.  Skin:  No rash or ecchymosis  Labs:   Recent Labs  07/06/13 0746 07/07/13 0432  WBC 4.1 6.6  HGB 10.2* 10.0*  HCT 29.7* 28.5*  PLT 34* 41*    Recent Labs  07/06/13 1512 07/07/13 0432  NA 134* 134*  K 4.1 4.5  CL 99 102  CO2 20 22  GLUCOSE 260* 190*  BUN 17 16  CREATININE 0.96 0.96  CALCIUM 9.3 9.0      Images Studies/Results:   Dg Chest [facility] [address]  07/06/2013   CLINICAL DATA:  Short of breath  EXAM: PORTABLE CHEST - 1 VIEW  COMPARISON:  06/15/2012  FINDINGS: Normal heart size. Clear lungs. Stable right internal jugular vein Port-A-Cath. No pneumothorax or pleural effusion. Density over the posterior right 4th rib is stable and likely related to ectatic  vasculature appear  IMPRESSION: No active cardiopulmonary disease.   Electronically Signed   By: Maryclare Bean M.D.   On: 07/06/2013 15:35     Patient Active Problem List   Diagnosis Date Noted  . Infusion reaction 07/06/2013  . Thrombocytopenia, unspecified 06/15/2013  . Hyperlipidemia   . Memory loss   . Vertigo 02/13/2012  . Pleurisy 02/13/2012  . CLL (chronic lymphoid leukemia) in relapse 02/13/2012  . Low grade malignant lymphoma 02/03/2012  . Stroke 02/03/2012  . Benign essential HTN 02/03/2012  . Macrocytic anemia 02/03/2012  . Breast cancer left T2N0 S/P mastectomy and SLN Bx 09/2010 09/18/2011     Impression: #1. Allergic drug reaction to anti-CD20 monoclonal antibody #2. Poorly controlled essential hypertension #3. 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 Anemia and thrombocytopenia secondary to #3  Recommendation: I agree with management per hospital medicine service. When she is otherwise stable and MI ruled out she could be discharged. If symptoms recur, we may need to get cardiology involved. I will permanently discontinued this antibody treatment. She has followup with me on October 31. Please send a copy of the discharge summary to her primary care physician as well (Dr. Rene Paci).        GRANFORTUNA,JAMES M 07/07/2013, 7:50 AM

## 2013-07-07 NOTE — Progress Notes (Signed)
D/c instructions given to pt, reviewed with her and her daughter. Copy of instructions given to pt and provided a copy to her daughter at pt's request. Pt d/c'd via wheelchair with belongings with family and escorted by hospital staff.

## 2013-07-07 NOTE — Progress Notes (Signed)
MD paged about BP - 171/55. Will continue to monitor.  [name]. Clelia Croft, RN

## 2013-07-08 ENCOUNTER — Other Ambulatory Visit (HOSPITAL_BASED_OUTPATIENT_CLINIC_OR_DEPARTMENT_OTHER): Payer: Medicare Other | Admitting: Lab

## 2013-07-08 DIAGNOSIS — C9192 Lymphoid leukemia, unspecified, in relapse: Secondary | ICD-10-CM

## 2013-07-08 DIAGNOSIS — C9112 Chronic lymphocytic leukemia of B-cell type in relapse: Secondary | ICD-10-CM

## 2013-07-08 LAB — CBC WITH DIFFERENTIAL/PLATELET
BASO%: 0.6 % (ref 0.0–2.0)
Basophils Absolute: 0 10*3/uL (ref 0.0–0.1)
EOS%: 0.7 % (ref 0.0–7.0)
HCT: 30.3 % — ABNORMAL LOW (ref 34.8–46.6)
HGB: 10.3 g/dL — ABNORMAL LOW (ref 11.6–15.9)
MCH: 36.5 pg — ABNORMAL HIGH (ref 25.1–34.0)
MCHC: 34 g/dL (ref 31.5–36.0)
MONO#: 0.4 10*3/uL (ref 0.1–0.9)
MONO%: 5.3 % (ref 0.0–14.0)
NEUT%: 40.6 % (ref 38.4–76.8)
Platelets: 38 10*3/uL — ABNORMAL LOW (ref 145–400)
RBC: 2.82 10*6/uL — ABNORMAL LOW (ref 3.70–5.45)
RDW: 21 % — ABNORMAL HIGH (ref 11.2–14.5)
WBC: 6.8 10*3/uL (ref 3.9–10.3)
lymph#: 3.6 10*3/uL — ABNORMAL HIGH (ref 0.9–3.3)

## 2013-07-13 ENCOUNTER — Ambulatory Visit (INDEPENDENT_AMBULATORY_CARE_PROVIDER_SITE_OTHER): Payer: Medicare Other | Admitting: Internal Medicine

## 2013-07-13 ENCOUNTER — Encounter: Payer: Self-pay | Admitting: Internal Medicine

## 2013-07-13 ENCOUNTER — Telehealth: Payer: Self-pay | Admitting: Oncology

## 2013-07-13 VITALS — BP 126/68 | HR 64 | Temp 98.5°F | Wt 254.6 lb

## 2013-07-13 DIAGNOSIS — C9112 Chronic lymphocytic leukemia of B-cell type in relapse: Secondary | ICD-10-CM

## 2013-07-13 DIAGNOSIS — Z5189 Encounter for other specified aftercare: Secondary | ICD-10-CM

## 2013-07-13 DIAGNOSIS — Z23 Encounter for immunization: Secondary | ICD-10-CM

## 2013-07-13 DIAGNOSIS — C9192 Lymphoid leukemia, unspecified, in relapse: Secondary | ICD-10-CM

## 2013-07-13 DIAGNOSIS — T8090XD Unspecified complication following infusion and therapeutic injection, subsequent encounter: Secondary | ICD-10-CM

## 2013-07-13 DIAGNOSIS — I10 Essential (primary) hypertension: Secondary | ICD-10-CM

## 2013-07-13 NOTE — Progress Notes (Signed)
Subjective:    Patient ID: [name], female    DOB: 11-04-1933, 79 y.o.   MRN: [id number]  HPI Here for hospitalization follow up following adverse reaction to salvage chemotherapy Admit date: 07/06/2013 Discharge date: 07/07/2013  Time spent:  Recommendations for Outpatient Follow-up:   1. Dr. Cyndie Chime next week 2. Dr.Leschber in 7-10days  Discharge Diagnoses:   Principal Problem:   Infusion reaction Active Problems:   Benign essential HTN   CLL (chronic lymphoid leukemia) in relapse   Memory loss   Thrombocytopenia, unspecified   Also reviewed chronic medical issues and interval medical events:  CLL, relapse summer 2014 - initially diagnosed in 2009, partial remission achieved with Arzenna as needed, no salvage chemo - follows with oncology for same -   Breast cancer - diagnosed 2009, status post mastectomy in treatment for same. Remains on tamoxifen and follows regularly with oncology  CVA 05/2009 - ongoing risk factor treatment and aspirin therapy - recurrent TIA with vertigo symptoms 01/2012 - persisting chronic dizziness and balance problems, but no other recurrent symptoms   Hypertension - added amlodipine 06/2013 -the patient reports compliance with medication(s) as prescribed. Denies adverse side effects.  Early dementia- began generic Aricept for same 01/2013 - describes difficulty initiating conversation and word finding problems - poor recollection of short term event but denies problems with $ or directions. symptoms also noted by family and friends - associated with poor balance since 01/2012 TIA symptoms   Dyslipidemia - began statin 01/2013 - the patient reports compliance with medication(s) as prescribed. Denies adverse side effects.   Past Medical History  Diagnosis Date  . 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 Stroke 05/2009   05/2009  Right upper extrem/leg weakness; nuchal pain;  . Benign essential HTN   . Macrocytic anemia   . Vertigo     ?TIA - MRI brain 5/13  No new CVA  . Pleurisy     01/16/12 pleuritic right chest pain  V/Q scan [place] [facility] low probability PE  . Breast cancer left T2N0 S/P mastectomy and SLN Bx 09/2010 2009  . CLL (chronic lymphoid leukemia) in relapse   . Arthritis   . Hyperlipidemia   . Thrombocytopenia, unspecified 06/15/2013    Review of Systems      Objective:   Physical Exam BP 126/68  Pulse 64  Temp(Src) 98.5 F (36.9 C) (Oral)  Wt 254 lb 9.6 oz (115.486 kg)  BMI 46.56 kg/m2  SpO2 98% Wt Readings from Last 3 Encounters:  07/13/13 254 lb 9.6 oz (115.486 kg)  07/06/13 257 lb 4.8 oz (116.711 kg)  07/04/13 257 lb 4.8 oz (116.711 kg)   Constitutional: She is overweight, but appears well-developed and well-nourished. No distress. Dtr at side Neck: Normal range of motion. Neck supple. No JVD present. No thyromegaly present.  Cardiovascular: Normal rate, regular rhythm and normal heart sounds.  No murmur heard. No BLE edema. Pulmonary/Chest: Effort normal and breath sounds normal. No respiratory distress. She has no wheezes.  Neurological: She is alert and oriented to person, place, and time. No cranial nerve deficit. Mild/min chronic R HP U>L. Coordination, balance, overall strength and assisted gait are normal. Recall 2/3 at 3 minutes - slight delay in initiation of conversation, but fleuent without dysarthria or aphasia Skin: Skin is warm and dry. No rash noted. No erythema.  Psychiatric: She has a normal mood  and affect. Her behavior is normal. Judgment and thought content normal.   Lab Results  Component Value Date   WBC 6.8 07/08/2013   HGB 10.3* 07/08/2013   HCT 30.3* 07/08/2013   PLT 38* 07/08/2013   GLUCOSE 190* 07/07/2013   CHOL 287* 02/01/2013   TRIG 168.0* 02/01/2013   HDL 75.30 02/01/2013   LDLDIRECT 179.3 02/01/2013   LDLCALC 78 01/01/2011   ALT 57*  07/04/2013   AST 40* 07/04/2013   NA 134* 07/07/2013   K 4.5 07/07/2013   CL 102 07/07/2013   CREATININE 0.96 07/07/2013   BUN 16 07/07/2013   CO2 22 07/07/2013   TSH 1.74 02/01/2013   INR 0.98 02/19/2012   HGBA1C  Value: 5.5 (NOTE)                                                                       According to the ADA Clinical Practice Recommendations for 2011, when HbA1c is used as a screening test:   >=6.5%   Diagnostic of Diabetes Mellitus           (if abnormal result  is confirmed)  5.7-6.4%   Increased risk of developing Diabetes Mellitus  References:Diagnosis and Classification of Diabetes Mellitus,Diabetes Care,2011,34(Suppl 1):S62-S69 and Standards of Medical Care in         Diabetes - 2011,Diabetes Care,2011,34  (Suppl 1):S11-S61. 06/13/2010       Assessment & Plan:   Adverse reaction to infusion -hospitalization reviewed, symptoms resolved. Allergy list updated today  Also see problem list. Medications and labs reviewed today.  Time spent with pt/family today 25 minutes, greater than 50% time spent counseling patient on hospitalization, hypertension, CLL and medication review. Also review of prior records

## 2013-07-13 NOTE — Patient Instructions (Signed)
It was good to see you today.  Your annual flu shot was given and/or updated today.  Blood pressure looks very good today!  We have reviewed your hospital records including labs and tests today  Medications reviewed and updated, no changes recommended at this time.  Keep followup with Dr. Reece Agar. as planned for next steps with your chemotherapy  Keep followup with me in February 2014 for your six-month followup, please call sooner if problems

## 2013-07-13 NOTE — Assessment & Plan Note (Signed)
Initially diagnosed 2009 with partial remission, then relapse 04/2013 - Associated with thrombocytopenia and anemia Adverse infusion reaction to Obinutuzumab(Anti CD20 antibodies) 06/2013 prompting hospitalization/supportive care (IVF, Solumedrol) -on salvage chemo  continue working with onc -Dr.Granfortuna - on salvage regimen  continue prophylactic BActrim and acyclovir

## 2013-07-13 NOTE — Telephone Encounter (Signed)
Per staff message response from JG on 10/6 cx 11/3 lb/fu and keep 10/31 lb/fu. lmonvm for pt dtr [name] today and asked that she get new schedule on Friday.

## 2013-07-13 NOTE — Progress Notes (Signed)
Pre-visit discussion using our clinic review tool. No additional management support is needed unless otherwise documented below in the visit note.  

## 2013-07-13 NOTE — Assessment & Plan Note (Signed)
BP Readings from Last 3 Encounters:  07/13/13 126/68  07/07/13 158/56  07/06/13 143/66   amlodipine added October 2014 hospitalization and improved The current medical regimen is effective;  continue present plan and medications.

## 2013-07-14 ENCOUNTER — Other Ambulatory Visit: Payer: Self-pay | Admitting: Oncology

## 2013-07-14 DIAGNOSIS — C8590 Non-Hodgkin lymphoma, unspecified, unspecified site: Secondary | ICD-10-CM

## 2013-07-14 DIAGNOSIS — C9192 Lymphoid leukemia, unspecified, in relapse: Secondary | ICD-10-CM

## 2013-07-15 ENCOUNTER — Other Ambulatory Visit (HOSPITAL_BASED_OUTPATIENT_CLINIC_OR_DEPARTMENT_OTHER): Payer: Medicare Other

## 2013-07-15 DIAGNOSIS — C9192 Lymphoid leukemia, unspecified, in relapse: Secondary | ICD-10-CM

## 2013-07-15 DIAGNOSIS — C9112 Chronic lymphocytic leukemia of B-cell type in relapse: Secondary | ICD-10-CM

## 2013-07-15 DIAGNOSIS — C8590 Non-Hodgkin lymphoma, unspecified, unspecified site: Secondary | ICD-10-CM

## 2013-07-15 LAB — CBC WITH DIFFERENTIAL/PLATELET
Basophils Absolute: 0 10*3/uL (ref 0.0–0.1)
Eosinophils Absolute: 0.1 10*3/uL (ref 0.0–0.5)
HGB: 10.1 g/dL — ABNORMAL LOW (ref 11.6–15.9)
MCV: 107.6 fL — ABNORMAL HIGH (ref 79.5–101.0)
MONO#: 0.3 10*3/uL (ref 0.1–0.9)
MONO%: 6 % (ref 0.0–14.0)
NEUT#: 1.6 10*3/uL (ref 1.5–6.5)
NEUT%: 33.5 % — ABNORMAL LOW (ref 38.4–76.8)
RBC: 2.76 10*6/uL — ABNORMAL LOW (ref 3.70–5.45)
RDW: 20.6 % — ABNORMAL HIGH (ref 11.2–14.5)
WBC: 4.6 10*3/uL (ref 3.9–10.3)
lymph#: 2.7 10*3/uL (ref 0.9–3.3)
nRBC: 1 % — ABNORMAL HIGH (ref 0–0)

## 2013-07-20 ENCOUNTER — Telehealth: Payer: Self-pay | Admitting: *Deleted

## 2013-07-20 ENCOUNTER — Other Ambulatory Visit (HOSPITAL_BASED_OUTPATIENT_CLINIC_OR_DEPARTMENT_OTHER): Payer: Medicare Other | Admitting: Lab

## 2013-07-20 ENCOUNTER — Ambulatory Visit: Payer: Medicare Other

## 2013-07-20 DIAGNOSIS — C9192 Lymphoid leukemia, unspecified, in relapse: Secondary | ICD-10-CM

## 2013-07-20 DIAGNOSIS — C9112 Chronic lymphocytic leukemia of B-cell type in relapse: Secondary | ICD-10-CM

## 2013-07-20 LAB — CBC WITH DIFFERENTIAL/PLATELET
BASO%: 0.5 % (ref 0.0–2.0)
Basophils Absolute: 0 10*3/uL (ref 0.0–0.1)
EOS%: 2.4 % (ref 0.0–7.0)
Eosinophils Absolute: 0.1 10*3/uL (ref 0.0–0.5)
HCT: 28 % — ABNORMAL LOW (ref 34.8–46.6)
HGB: 9.5 g/dL — ABNORMAL LOW (ref 11.6–15.9)
LYMPH%: 60 % — ABNORMAL HIGH (ref 14.0–49.7)
MCHC: 33.9 g/dL (ref 31.5–36.0)
MONO#: 0.3 10*3/uL (ref 0.1–0.9)
NEUT#: 1.3 10*3/uL — ABNORMAL LOW (ref 1.5–6.5)
NEUT%: 30.7 % — ABNORMAL LOW (ref 38.4–76.8)
Platelets: 34 10*3/uL — ABNORMAL LOW (ref 145–400)
WBC: 4.2 10*3/uL (ref 3.9–10.3)
lymph#: 2.5 10*3/uL (ref 0.9–3.3)

## 2013-07-20 NOTE — Telephone Encounter (Signed)
Per Dr. Cyndie Chime; confirmed with pt and daughter pt meds----Chlorambucil 2mg  last dose taken 07/18/13; Allopurinol-will take through the end of the month; stopped Amicar; continues to take all others meds as directed.  Confirmed appt on 07/29/13.  Note to MD.

## 2013-07-20 NOTE — Progress Notes (Signed)
Patient will not receive Obinutuzumab today per Dr. Cyndie Chime. Patient does not need lab appointment on 10/24, but patient will see Dr. Cyndie Chime on 10/31 as scheduled. Reviewed the above information with patient and her daughter, patient and patient's daughter verbalized understanding.

## 2013-07-21 MED ORDER — CHLORAMBUCIL 2 MG PO TABS: 2.0000 mg | ORAL_TABLET | Freq: Every day | ORAL | Status: DC

## 2013-07-21 NOTE — Addendum Note (Signed)
Addended by: Gala Romney on: 07/21/2013 10:43 AM   Modules accepted: Orders

## 2013-07-22 ENCOUNTER — Other Ambulatory Visit: Payer: Medicare Other

## 2013-07-29 ENCOUNTER — Ambulatory Visit (HOSPITAL_BASED_OUTPATIENT_CLINIC_OR_DEPARTMENT_OTHER): Payer: Medicare Other | Admitting: Oncology

## 2013-07-29 ENCOUNTER — Telehealth: Payer: Self-pay | Admitting: Oncology

## 2013-07-29 ENCOUNTER — Other Ambulatory Visit: Payer: Self-pay | Admitting: *Deleted

## 2013-07-29 ENCOUNTER — Other Ambulatory Visit (HOSPITAL_BASED_OUTPATIENT_CLINIC_OR_DEPARTMENT_OTHER): Payer: Medicare Other | Admitting: Lab

## 2013-07-29 VITALS — BP 165/67 | HR 63 | Temp 98.0°F | Resp 18 | Ht 62.0 in | Wt 253.5 lb

## 2013-07-29 DIAGNOSIS — C9112 Chronic lymphocytic leukemia of B-cell type in relapse: Secondary | ICD-10-CM

## 2013-07-29 DIAGNOSIS — C8590 Non-Hodgkin lymphoma, unspecified, unspecified site: Secondary | ICD-10-CM

## 2013-07-29 DIAGNOSIS — R42 Dizziness and giddiness: Secondary | ICD-10-CM

## 2013-07-29 DIAGNOSIS — C50919 Malignant neoplasm of unspecified site of unspecified female breast: Secondary | ICD-10-CM

## 2013-07-29 DIAGNOSIS — C50912 Malignant neoplasm of unspecified site of left female breast: Secondary | ICD-10-CM

## 2013-07-29 DIAGNOSIS — C9110 Chronic lymphocytic leukemia of B-cell type not having achieved remission: Secondary | ICD-10-CM

## 2013-07-29 DIAGNOSIS — D696 Thrombocytopenia, unspecified: Secondary | ICD-10-CM

## 2013-07-29 DIAGNOSIS — R609 Edema, unspecified: Secondary | ICD-10-CM

## 2013-07-29 DIAGNOSIS — I10 Essential (primary) hypertension: Secondary | ICD-10-CM

## 2013-07-29 DIAGNOSIS — C9192 Lymphoid leukemia, unspecified, in relapse: Secondary | ICD-10-CM

## 2013-07-29 LAB — CBC WITH DIFFERENTIAL/PLATELET
BASO%: 0.7 % (ref 0.0–2.0)
LYMPH%: 60 % — ABNORMAL HIGH (ref 14.0–49.7)
MCHC: 34 g/dL (ref 31.5–36.0)
MONO#: 0.5 10*3/uL (ref 0.1–0.9)
MONO%: 13.6 % (ref 0.0–14.0)
Platelets: 47 10*3/uL — ABNORMAL LOW (ref 145–400)
RBC: 2.58 10*6/uL — ABNORMAL LOW (ref 3.70–5.45)
WBC: 4 10*3/uL (ref 3.9–10.3)
lymph#: 2.4 10*3/uL (ref 0.9–3.3)

## 2013-07-29 LAB — COMPREHENSIVE METABOLIC PANEL (CC13)
ALT: 40 U/L (ref 0–55)
AST: 38 U/L — ABNORMAL HIGH (ref 5–34)
Anion Gap: 9 mEq/L (ref 3–11)
CO2: 23 mEq/L (ref 22–29)
Calcium: 9.7 mg/dL (ref 8.4–10.4)
Chloride: 109 mEq/L (ref 98–109)
Creatinine: 1.2 mg/dL — ABNORMAL HIGH (ref 0.6–1.1)
Glucose: 115 mg/dl (ref 70–140)
Total Bilirubin: 0.7 mg/dL (ref 0.20–1.20)

## 2013-07-29 LAB — LACTATE DEHYDROGENASE (CC13): LDH: 308 U/L — ABNORMAL HIGH (ref 125–245)

## 2013-07-29 MED ORDER — CHLORAMBUCIL 2 MG PO TABS: 2.0000 mg | ORAL_TABLET | Freq: Every day | ORAL | Status: DC

## 2013-07-29 NOTE — Telephone Encounter (Signed)
Called pt and left message regarding lab every 2 weeks until Ml and Md visit on january 2015

## 2013-07-29 NOTE — Telephone Encounter (Signed)
Emailed [name] regarding chemo

## 2013-07-29 NOTE — Telephone Encounter (Signed)
Gave pt appt for lab and ML for december and Md on January with lab 2015

## 2013-07-30 NOTE — Progress Notes (Signed)
Hematology and Oncology Follow Up Visit  YELITZA REACH [id number] [birth year] 79 y.o. 07/30/2013 6:43 PM   Principle Diagnosis: Encounter Diagnoses  Name Primary?  . Breast cancer, left   . Thrombocytopenia, unspecified   . CLL (chronic lymphoid leukemia) in relapse Yes  . Low grade malignant lymphoma      Interim History:   Short interval followup visit for this 79 year old woman with multiply relapsed well-differentiated lymphocytic lymphoma/CLL. See recent summary note dated September 17 for full details of previous diagnoses and treatment. Blood counts began to deteriorate again rapidly at the beginning of August. She had a brief trial of ibrutinib stopped after just 3 weeks for profound fatigue and rapidly falling platelet count which got down to 3000 by September 12. She was started on Amicar. She received daily platelet transfusions with poor response and HLA platelets were obtained. I started her on a salvage regimen with one of the new anti-CD20 antibodies, Obinutuzumab along with daily, oral, chlorambucil at a dose of 4 mg. Leukeran started on September 22. First dose of the Obinutuzumab started on September 24.  She  had nothing short of a miraculous early response with a rise in her platelet count up to 42,000, a reversal of her white count differential with a fall and lymphocytes from 80% of the differential down to 55% and rise in her neutrophil percentage up to 40%. Unfortunately, with the third dose of the antibody given on October 8, she developed significant chest tightness requiring hospital admission for observation. She did rule out for MI. However I am reluctant to give her this drug again. She has had no recurrent episodes of chest tightness since discharge. She has had no new neurologic symptoms. She has had no fever.   Medications: reviewed  Allergies:  Allergies  Allergen Reactions  . Latex   . Obinutuzumab   . Penicillins     Review of Systems:  Hematology:  negative for swollen glands, easy bruising, epistaxis, gum bleeding, hematuria, hematochezia ENT ROS: negative for - oral lesions or sore throat Breast ROS: negative for - new or changing breast lumps Respiratory ROS: negative for - cough, pleuritic pain, shortness of breath or wheezing Cardiovascular ROS: negative for - chest pain, dyspnea on exertion, edema, irregular heartbeat, murmur, orthopnea, palpitations, paroxysmal nocturnal dyspnea or rapid heart rate Gastrointestinal ROS: negative for - abdominal pain, appetite loss, blood in stools, change in bowel habits, constipation, diarrhea, heartburn, hematemesis, melena, nausea/vomiting or swallowing difficulty/pain Genito-Urinary ROS: negative for, dysuria, hematuria, incontinence, a or urinary frequency/urgency Musculoskeletal ROS: negative for - joint pain, joint stiffness, joint swelling, muscle pain, muscular weakness  Neurological ROS: negative for - behavioral changes, confusion, dizziness, gait disturbance, headaches, impaired coordination/balance, memory loss, numbness/tingling, Dermatological ROS: negative for rash, ecchymosis Remaining ROS negative.  Physical Exam: Blood pressure 165/67, pulse 63, temperature 98 F (36.7 C), temperature source Oral, resp. rate 18, height 5\' 2"  (1.575 m), weight 253 lb 8 oz (114.987 kg), SpO2 97.00%. Wt Readings from Last 3 Encounters:  07/29/13 253 lb 8 oz (114.987 kg)  07/13/13 254 lb 9.6 oz (115.486 kg)  07/06/13 257 lb 4.8 oz (116.711 kg)     General appearance: Overweight African American woman HENNT: Pharynx no erythema, exudate, mass, or ulcer. No thyromegaly or thyroid nodules Lymph nodes: No cervical, supraclavicular, or axillary lymphadenopathy. Recent recurrent left axillary adenopathy has resolved. Breasts: Left mastectomy. No right breast masses. Lungs: Clear to auscultation, resonant to percussion throughout Heart: Regular rhythm, 2/6 systolic murmur left sternal  border, murmur, no  gallop, no rub, no click, no edema Abdomen: Soft, nontender, normal bowel sounds, no mass, no organomegaly Extremities: No edema, no calf tenderness Musculoskeletal: no joint deformities GU:  Vascular: Carotid pulses 2+, no bruits,  Neurologic: Alert, oriented, PERRLA, optic discs sharp and vessels normal, no hemorrhage or exudate, cranial nerves grossly normal, motor strength 5 over 5, reflexes 1+ symmetric, upper body coordination normal, gait normal, Skin: No rash or ecchymosis  Lab Results: CBC W/Diff    Component Value Date/Time   WBC 4.0 07/29/2013 1205   WBC 6.6 07/07/2013 0432   RBC 2.58* 07/29/2013 1205   RBC 2.70* 07/07/2013 0432   HGB 9.8* 07/29/2013 1205   HGB 10.0* 07/07/2013 0432   HCT 28.9* 07/29/2013 1205   HCT 28.5* 07/07/2013 0432   PLT 47* 07/29/2013 1205   PLT 41* 07/07/2013 0432   MCV 111.9* 07/29/2013 1205   MCV 105.6* 07/07/2013 0432   MCH 38.0* 07/29/2013 1205   MCH 37.0* 07/07/2013 0432   MCHC 34.0 07/29/2013 1205   MCHC 35.1 07/07/2013 0432   RDW 22.2* 07/29/2013 1205   RDW 20.8* 07/07/2013 0432   LYMPHSABS 2.4 07/29/2013 1205   LYMPHSABS 6.2* 06/11/2013 0810   MONOABS 0.5 07/29/2013 1205   MONOABS 0.3 06/11/2013 0810   EOSABS 0.1 07/29/2013 1205   EOSABS 0.0 06/11/2013 0810   BASOSABS 0.0 07/29/2013 1205   BASOSABS 0.0 06/11/2013 0810     Chemistry      Component Value Date/Time   NA 141 07/29/2013 1205   NA 134* 07/07/2013 0432   NA 142 01/01/2011   K 4.5 07/29/2013 1205   K 4.5 07/07/2013 0432   CL 102 07/07/2013 0432   CL 107 02/25/2013 1028   CO2 23 07/29/2013 1205   CO2 22 07/07/2013 0432   BUN 14.6 07/29/2013 1205   BUN 16 07/07/2013 0432   BUN 16 01/01/2011   CREATININE 1.2* 07/29/2013 1205   CREATININE 0.96 07/07/2013 0432   CREATININE 0.9 01/01/2011   GLU 104 01/01/2011      Component Value Date/Time   CALCIUM 9.7 07/29/2013 1205   CALCIUM 9.0 07/07/2013 0432   ALKPHOS 79 07/29/2013 1205   ALKPHOS 76 05/27/2013 1226   AST 38* 07/29/2013 1205    AST 28 05/27/2013 1226   ALT 40 07/29/2013 1205   ALT 24 05/27/2013 1226   BILITOT 0.70 07/29/2013 1205   BILITOT 0.5 05/27/2013 1226       Radiological Studies: Dg Chest Port 1 View  07/06/2013   CLINICAL DATA:  Short of breath  EXAM: PORTABLE CHEST - 1 VIEW  COMPARISON:  06/15/2012  FINDINGS: Normal heart size. Clear lungs. Stable right internal jugular vein Port-A-Cath. No pneumothorax or pleural effusion. Density over the posterior right 4th rib is stable and likely related to ectatic vasculature appear  IMPRESSION: No active cardiopulmonary disease.   Electronically Signed   By: Maryclare Bean M.D.   On: 07/06/2013 15:35    Impression: #1. Multiply relapsed WDLL/CLL I had a lengthy discussion with her and her daughter about treatment options going forward. I strongly encouraged her to go on one of the new signal transduction inhibitors either to go back on the ibrutinib or a trial of idelalisib. I was at a recent conference where data were presented from the group at [place] that has the largest world's experience with ibrutinib, over 600 patients. I asked the speaker to comment on the profound fatigue I am seeing in my patients.  He said that in general the patients become tolerant to the drug after they are they are on it  for a month or 2. Mrs. Venables remains very reluctant to go on either of these new drugs. I did give a pamphlet to her daughter to review on the idelalisib. She is tolerating the oral Leukeran very well. I gave her the option of staying on this drug for now but to be ready to transition to one of the newer drugs if her counts start to deteriorate. I will continue Leukeran 2 mg daily with close lab and clinical monitoring.  #2. Reaction to obinutuzumab as outlined above which will prohibit use of this drug in the future.  #3. Multifocal invasive and noninvasive cancer left breast status post mastectomy 10/09/2010 currently on tamoxifen.   #4. Essential hypertension   #5.  Recurrent atypical neurologic symptoms with dizziness, vertigo, paresthesias.  Result for the moment. Most recent MRI brain done August 8 shows a remote infarct in the left thalamus. Mild changes in small vessels    CC: Patient Care Team: Newt Lukes, MD as PCP - General (Internal Medicine) Levert Feinstein, MD (Hematology and Oncology) Mariella Saa, MD (General Surgery) Beverley Fiedler, MD (Gastroenterology)   Levert Feinstein, MD 11/1/20146:43 PM

## 2013-08-01 ENCOUNTER — Other Ambulatory Visit: Payer: Medicare Other | Admitting: Lab

## 2013-08-01 ENCOUNTER — Ambulatory Visit: Payer: Medicare Other | Admitting: Nurse Practitioner

## 2013-08-08 ENCOUNTER — Other Ambulatory Visit (HOSPITAL_BASED_OUTPATIENT_CLINIC_OR_DEPARTMENT_OTHER): Payer: Medicare Other | Admitting: Lab

## 2013-08-08 DIAGNOSIS — C50912 Malignant neoplasm of unspecified site of left female breast: Secondary | ICD-10-CM

## 2013-08-08 DIAGNOSIS — D696 Thrombocytopenia, unspecified: Secondary | ICD-10-CM

## 2013-08-08 DIAGNOSIS — C8590 Non-Hodgkin lymphoma, unspecified, unspecified site: Secondary | ICD-10-CM

## 2013-08-08 DIAGNOSIS — C9192 Lymphoid leukemia, unspecified, in relapse: Secondary | ICD-10-CM

## 2013-08-08 DIAGNOSIS — C9112 Chronic lymphocytic leukemia of B-cell type in relapse: Secondary | ICD-10-CM

## 2013-08-08 LAB — CBC WITH DIFFERENTIAL/PLATELET
BASO%: 0.9 % (ref 0.0–2.0)
HCT: 29 % — ABNORMAL LOW (ref 34.8–46.6)
LYMPH%: 54.4 % — ABNORMAL HIGH (ref 14.0–49.7)
MCHC: 34.5 g/dL (ref 31.5–36.0)
MCV: 108.6 fL — ABNORMAL HIGH (ref 79.5–101.0)
MONO#: 0.6 10*3/uL (ref 0.1–0.9)
MONO%: 14.1 % — ABNORMAL HIGH (ref 0.0–14.0)
NEUT%: 27.1 % — ABNORMAL LOW (ref 38.4–76.8)
Platelets: 54 10*3/uL — ABNORMAL LOW (ref 145–400)
RBC: 2.67 10*6/uL — ABNORMAL LOW (ref 3.70–5.45)
RDW: 19.5 % — ABNORMAL HIGH (ref 11.2–14.5)
WBC: 4.3 10*3/uL (ref 3.9–10.3)
nRBC: 1 % — ABNORMAL HIGH (ref 0–0)

## 2013-08-12 ENCOUNTER — Telehealth: Payer: Self-pay | Admitting: *Deleted

## 2013-08-12 NOTE — Telephone Encounter (Signed)
Message copied by Sabino Snipes on Fri Aug 12, 2013 12:55 PM ------      Message from: Levert Feinstein      Created: Mon Aug 08, 2013 12:45 PM       Call pt platelets up to 54,000 ------

## 2013-08-12 NOTE — Telephone Encounter (Signed)
Late entry:  Pt's daughter was given lab results 08/09/13 before she left that day.

## 2013-08-18 ENCOUNTER — Telehealth: Payer: Self-pay | Admitting: Oncology

## 2013-08-18 NOTE — Telephone Encounter (Signed)
Pt's daughter called and r/s labs to 08/24/13

## 2013-08-22 ENCOUNTER — Other Ambulatory Visit: Payer: Medicare Other | Admitting: Lab

## 2013-08-24 ENCOUNTER — Telehealth: Payer: Self-pay | Admitting: Oncology

## 2013-08-24 ENCOUNTER — Telehealth: Payer: Self-pay | Admitting: *Deleted

## 2013-08-24 ENCOUNTER — Other Ambulatory Visit (HOSPITAL_BASED_OUTPATIENT_CLINIC_OR_DEPARTMENT_OTHER): Payer: Medicare Other

## 2013-08-24 DIAGNOSIS — C50912 Malignant neoplasm of unspecified site of left female breast: Secondary | ICD-10-CM

## 2013-08-24 DIAGNOSIS — C9112 Chronic lymphocytic leukemia of B-cell type in relapse: Secondary | ICD-10-CM

## 2013-08-24 DIAGNOSIS — C8590 Non-Hodgkin lymphoma, unspecified, unspecified site: Secondary | ICD-10-CM

## 2013-08-24 DIAGNOSIS — C9192 Lymphoid leukemia, unspecified, in relapse: Secondary | ICD-10-CM

## 2013-08-24 DIAGNOSIS — C50919 Malignant neoplasm of unspecified site of unspecified female breast: Secondary | ICD-10-CM

## 2013-08-24 DIAGNOSIS — D696 Thrombocytopenia, unspecified: Secondary | ICD-10-CM

## 2013-08-24 LAB — CBC WITH DIFFERENTIAL/PLATELET
BASO%: 0.5 % (ref 0.0–2.0)
Basophils Absolute: 0 10*3/uL (ref 0.0–0.1)
EOS%: 4.4 % (ref 0.0–7.0)
HGB: 10.3 g/dL — ABNORMAL LOW (ref 11.6–15.9)
MCH: 37.9 pg — ABNORMAL HIGH (ref 25.1–34.0)
MCHC: 34.1 g/dL (ref 31.5–36.0)
MONO#: 0.5 10*3/uL (ref 0.1–0.9)
RDW: 17.9 % — ABNORMAL HIGH (ref 11.2–14.5)
WBC: 3.9 10*3/uL (ref 3.9–10.3)
lymph#: 1.9 10*3/uL (ref 0.9–3.3)

## 2013-08-24 NOTE — Telephone Encounter (Signed)
Gave pt appt for lab ,ml and flusgh for 09/05/13

## 2013-08-24 NOTE — Telephone Encounter (Signed)
Pt notified of platelet results per Dr Cyndie Chime.

## 2013-08-24 NOTE — Telephone Encounter (Signed)
Message copied by Sabino Snipes on Wed Aug 24, 2013  5:12 PM ------      Message from: Levert Feinstein      Created: Wed Aug 24, 2013  3:11 PM       Call pt counts stable platelets up to 74,000 ------

## 2013-09-02 ENCOUNTER — Telehealth: Payer: Self-pay | Admitting: Dietician

## 2013-09-02 NOTE — Telephone Encounter (Signed)
Brief Outpatient Oncology Nutrition Note  Patient has been identified to be at risk on malnutrition screen.  Wt Readings from Last 10 Encounters:  07/29/13 253 lb 8 oz (114.987 kg)  07/13/13 254 lb 9.6 oz (115.486 kg)  07/06/13 257 lb 4.8 oz (116.711 kg)  07/04/13 257 lb 4.8 oz (116.711 kg)  06/24/13 264 lb 8 oz (119.976 kg)  06/15/13 260 lb 8 oz (118.162 kg)  05/27/13 268 lb 6.4 oz (121.745 kg)  05/13/13 268 lb (121.564 kg)  05/13/13 266 lb 14.4 oz (121.065 kg)  04/29/13 268 lb (121.564 kg)   Dx:  CLL with multiple relapses.  Called patient due to weight loss.  No answering service on the land line.  Cell number is the daughters.  Spoke with daughter who reported that patient had been doing well.  Name and contact information for [facility] provided.  Oran Rein, RD, LDN

## 2013-09-04 ENCOUNTER — Other Ambulatory Visit: Payer: Self-pay | Admitting: Oncology

## 2013-09-05 ENCOUNTER — Ambulatory Visit (HOSPITAL_BASED_OUTPATIENT_CLINIC_OR_DEPARTMENT_OTHER): Payer: Medicare Other | Admitting: Nurse Practitioner

## 2013-09-05 ENCOUNTER — Other Ambulatory Visit (HOSPITAL_BASED_OUTPATIENT_CLINIC_OR_DEPARTMENT_OTHER): Payer: Medicare Other | Admitting: Lab

## 2013-09-05 ENCOUNTER — Ambulatory Visit: Payer: Medicare Other

## 2013-09-05 VITALS — BP 216/73 | HR 87 | Temp 97.2°F | Resp 18

## 2013-09-05 VITALS — BP 139/65 | HR 74 | Temp 97.8°F | Resp 20 | Ht 62.0 in | Wt 251.8 lb

## 2013-09-05 DIAGNOSIS — C9192 Lymphoid leukemia, unspecified, in relapse: Secondary | ICD-10-CM

## 2013-09-05 DIAGNOSIS — C9112 Chronic lymphocytic leukemia of B-cell type in relapse: Secondary | ICD-10-CM

## 2013-09-05 DIAGNOSIS — C50919 Malignant neoplasm of unspecified site of unspecified female breast: Secondary | ICD-10-CM

## 2013-09-05 DIAGNOSIS — C50912 Malignant neoplasm of unspecified site of left female breast: Secondary | ICD-10-CM

## 2013-09-05 DIAGNOSIS — R42 Dizziness and giddiness: Secondary | ICD-10-CM

## 2013-09-05 DIAGNOSIS — C8590 Non-Hodgkin lymphoma, unspecified, unspecified site: Secondary | ICD-10-CM

## 2013-09-05 DIAGNOSIS — I10 Essential (primary) hypertension: Secondary | ICD-10-CM

## 2013-09-05 DIAGNOSIS — D696 Thrombocytopenia, unspecified: Secondary | ICD-10-CM

## 2013-09-05 LAB — COMPREHENSIVE METABOLIC PANEL (CC13)
ALT: 39 U/L (ref 0–55)
AST: 36 U/L — ABNORMAL HIGH (ref 5–34)
Albumin: 4.1 g/dL (ref 3.5–5.0)
Calcium: 9.8 mg/dL (ref 8.4–10.4)
Chloride: 107 mEq/L (ref 98–109)
Potassium: 4.2 mEq/L (ref 3.5–5.1)

## 2013-09-05 LAB — CBC WITH DIFFERENTIAL/PLATELET
EOS%: 5.8 % (ref 0.0–7.0)
MCH: 40.5 pg — ABNORMAL HIGH (ref 25.1–34.0)
MCHC: 35.4 g/dL (ref 31.5–36.0)
MCV: 114.5 fL — ABNORMAL HIGH (ref 79.5–101.0)
MONO#: 0.5 10*3/uL (ref 0.1–0.9)
MONO%: 16.8 % — ABNORMAL HIGH (ref 0.0–14.0)
RBC: 2.53 10*6/uL — ABNORMAL LOW (ref 3.70–5.45)
RDW: 17.2 % — ABNORMAL HIGH (ref 11.2–14.5)
WBC: 3.1 10*3/uL — ABNORMAL LOW (ref 3.9–10.3)
lymph#: 1.3 10*3/uL (ref 0.9–3.3)

## 2013-09-05 LAB — LACTATE DEHYDROGENASE (CC13): LDH: 327 U/L — ABNORMAL HIGH (ref 125–245)

## 2013-09-05 MED ORDER — SODIUM CHLORIDE 0.9 % IJ SOLN
10.0000 mL | INTRAMUSCULAR | Status: DC | PRN
  Administered 2013-09-05: 10 mL via INTRAVENOUS
  Filled 2013-09-05: qty 10

## 2013-09-05 MED ORDER — HEPARIN SOD (PORK) LOCK FLUSH 100 UNIT/ML IV SOLN
500.0000 [IU] | Freq: Once | INTRAVENOUS | Status: AC
  Administered 2013-09-05: 500 [IU] via INTRAVENOUS
  Filled 2013-09-05: qty 5

## 2013-09-05 NOTE — Progress Notes (Signed)
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 She is seen today for scheduled followup.  She feels well. No interim illnesses or infections. No recent dizzy spells. Appetite varies. No fevers. She has occasional sweats. She denies pain. No nausea or vomiting. No mouth sores. No diarrhea. She denies hematuria and dysuria.  Of note, she was running late this morning and did not take her blood pressure medication prior to leaving the house. She did take the blood pressure medication while in the exam room.   Objective: Blood pressure 139/65, pulse 74, temperature 97.8 F (36.6 C), temperature source Oral, resp. rate 20, height 5\' 2"  (1.575 m), weight 251 lb 12.8 oz (114.216 kg).  Oropharynx is without thrush or ulceration. No palpable cervical, supraclavicular or axillary lymph nodes. Lungs are clear. No wheezes or rales. Regular cardiac rhythm. Abdomen soft and nontender. No organomegaly. Trace lower leg edema bilaterally. Calves soft and nontender. Motor strength 5 over 5. Knee DTRs 1+, symmetric.  Lab Results: Lab Results  Component Value Date   WBC 3.1* 09/05/2013   HGB 10.3* 09/05/2013   HCT 29.0* 09/05/2013   MCV 114.5* 09/05/2013   PLT 84* 09/05/2013    Chemistry:    Chemistry      Component Value Date/Time   NA 140 09/05/2013 0944   NA 134* 07/07/2013 0432   NA 142 01/01/2011   K 4.2 09/05/2013 0944   K 4.5 07/07/2013 0432   CL 102 07/07/2013 0432   CL 107 02/25/2013 1028   CO2 23 09/05/2013 0944   CO2 22 07/07/2013 0432   BUN 13.3 09/05/2013 0944   BUN 16 07/07/2013 0432   BUN 16 01/01/2011   CREATININE 1.1 09/05/2013 0944   CREATININE 0.96 07/07/2013 0432   CREATININE 0.9 01/01/2011   GLU 104 01/01/2011      Component Value Date/Time   CALCIUM 9.8 09/05/2013 0944   CALCIUM 9.0 07/07/2013 0432   ALKPHOS 72 09/05/2013 0944   ALKPHOS 76 05/27/2013 1226   AST 36*  09/05/2013 0944   AST 28 05/27/2013 1226   ALT 39 09/05/2013 0944   ALT 24 05/27/2013 1226   BILITOT 0.82 09/05/2013 0944   BILITOT 0.5 05/27/2013 1226       Studies/Results: No results found.  Medications: I have reviewed the patient's current medications.  Assessment/Plan:  1. Multiply relapsed WDLL/CLL. She continues Leukeran 2 mg daily. 2. Reaction to Obinutuzumab. 3. Multifocal invasive and noninvasive cancer left breast status post mastectomy 10/09/2010. She continues tamoxifen. 4. Hypertension. 5. Recurrent atypical neurologic symptoms with dizziness, vertigo and paresthesias. No recent occurrences.  Disposition-she appears stable. Blood counts overall are stable and the platelet count continues to improve. She will continue the Leukeran when she appears to be tolerating well. We are checking labs on a 2 week schedule. She has a followup visit with Dr. Cyndie Chime on 10/17/2013.  Of note, blood pressure was markedly improved prior to leaving the office.  Lonna Cobb ANP/GNP-BC

## 2013-09-19 ENCOUNTER — Other Ambulatory Visit (HOSPITAL_BASED_OUTPATIENT_CLINIC_OR_DEPARTMENT_OTHER): Payer: Medicare Other

## 2013-09-19 DIAGNOSIS — C50919 Malignant neoplasm of unspecified site of unspecified female breast: Secondary | ICD-10-CM

## 2013-09-19 DIAGNOSIS — C9112 Chronic lymphocytic leukemia of B-cell type in relapse: Secondary | ICD-10-CM

## 2013-09-19 DIAGNOSIS — C50912 Malignant neoplasm of unspecified site of left female breast: Secondary | ICD-10-CM

## 2013-09-19 DIAGNOSIS — D696 Thrombocytopenia, unspecified: Secondary | ICD-10-CM

## 2013-09-19 DIAGNOSIS — C8590 Non-Hodgkin lymphoma, unspecified, unspecified site: Secondary | ICD-10-CM

## 2013-09-19 DIAGNOSIS — C8589 Other specified types of non-Hodgkin lymphoma, extranodal and solid organ sites: Secondary | ICD-10-CM

## 2013-09-19 DIAGNOSIS — C9192 Lymphoid leukemia, unspecified, in relapse: Secondary | ICD-10-CM

## 2013-09-19 LAB — CBC WITH DIFFERENTIAL/PLATELET
BASO%: 0.2 % (ref 0.0–2.0)
EOS%: 5.6 % (ref 0.0–7.0)
Eosinophils Absolute: 0.2 10*3/uL (ref 0.0–0.5)
LYMPH%: 45 % (ref 14.0–49.7)
MCHC: 35 g/dL (ref 31.5–36.0)
MONO#: 0.6 10*3/uL (ref 0.1–0.9)
NEUT#: 1.4 10*3/uL — ABNORMAL LOW (ref 1.5–6.5)
Platelets: 84 10*3/uL — ABNORMAL LOW (ref 145–400)
RBC: 2.58 10*6/uL — ABNORMAL LOW (ref 3.70–5.45)
RDW: 15.2 % — ABNORMAL HIGH (ref 11.2–14.5)
WBC: 4.1 10*3/uL (ref 3.9–10.3)

## 2013-09-27 ENCOUNTER — Telehealth: Payer: Self-pay

## 2013-09-27 ENCOUNTER — Other Ambulatory Visit: Payer: Self-pay | Admitting: *Deleted

## 2013-09-27 ENCOUNTER — Telehealth: Payer: Self-pay | Admitting: *Deleted

## 2013-09-27 DIAGNOSIS — J111 Influenza due to unidentified influenza virus with other respiratory manifestations: Secondary | ICD-10-CM

## 2013-09-27 MED ORDER — AZITHROMYCIN 250 MG PO TABS: ORAL_TABLET | ORAL | Status: DC

## 2013-09-27 MED ORDER — OSELTAMIVIR PHOSPHATE 75 MG PO CAPS: 75.0000 mg | ORAL_CAPSULE | Freq: Two times a day (BID) | ORAL | Status: DC

## 2013-09-27 MED ORDER — PROMETHAZINE-DM 6.25-15 MG/5ML PO SYRP: 5.0000 mL | ORAL_SOLUTION | Freq: Four times a day (QID) | ORAL | Status: DC | PRN

## 2013-09-27 NOTE — Telephone Encounter (Signed)
Notified pt with md response.../lmb 

## 2013-09-27 NOTE — Telephone Encounter (Signed)
Received vm call from pt stating that she has a cold with runny nose, temp 102.1, spotty nose bleed, eyes watery, cough, sneezing & diarrhea & this is about her third day.  Returned call & she has taken alka seltzer plus for colds.  She has no color to her secretions other than the spotty bleeding.  Informed that we would discuss with Dr Cyndie Chime.  She has also left a message with her PCP & is waiting to hear back. Note to Dr Cyndie Chime.

## 2013-09-27 NOTE — Telephone Encounter (Signed)
Called pt & informed to push po fluids, take tylenol, scripts for Tamiflu & z-pack sent to pharmacy & to call if getting worse.

## 2013-09-27 NOTE — Telephone Encounter (Signed)
Promethazine DM - syrup sent to [facility] pharmacy Office visit if symptoms worse or unimproved

## 2013-09-27 NOTE — Telephone Encounter (Signed)
The patient called and is requesting a medicine called in for cough and body aches.  I offered her an ov, however, the pt stated she is unable to come in due to her condition.   Callback - [phone]

## 2013-10-03 ENCOUNTER — Other Ambulatory Visit: Payer: Self-pay | Admitting: Oncology

## 2013-10-03 ENCOUNTER — Other Ambulatory Visit (HOSPITAL_BASED_OUTPATIENT_CLINIC_OR_DEPARTMENT_OTHER): Payer: Medicare Other

## 2013-10-03 DIAGNOSIS — C9112 Chronic lymphocytic leukemia of B-cell type in relapse: Secondary | ICD-10-CM

## 2013-10-03 DIAGNOSIS — C9192 Lymphoid leukemia, unspecified, in relapse: Secondary | ICD-10-CM

## 2013-10-03 DIAGNOSIS — C8590 Non-Hodgkin lymphoma, unspecified, unspecified site: Secondary | ICD-10-CM

## 2013-10-03 LAB — CBC WITH DIFFERENTIAL/PLATELET
BASO%: 0.4 % (ref 0.0–2.0)
Basophils Absolute: 0 10*3/uL (ref 0.0–0.1)
EOS%: 6.1 % (ref 0.0–7.0)
Eosinophils Absolute: 0.2 10*3/uL (ref 0.0–0.5)
HCT: 26.4 % — ABNORMAL LOW (ref 34.8–46.6)
HEMOGLOBIN: 9.1 g/dL — AB (ref 11.6–15.9)
LYMPH#: 1.1 10*3/uL (ref 0.9–3.3)
LYMPH%: 43.7 % (ref 14.0–49.7)
MCH: 38.2 pg — ABNORMAL HIGH (ref 25.1–34.0)
MCHC: 34.5 g/dL (ref 31.5–36.0)
MCV: 110.9 fL — ABNORMAL HIGH (ref 79.5–101.0)
MONO#: 0.4 10*3/uL (ref 0.1–0.9)
MONO%: 15.4 % — ABNORMAL HIGH (ref 0.0–14.0)
NEUT#: 0.9 10*3/uL — ABNORMAL LOW (ref 1.5–6.5)
NEUT%: 34.4 % — AB (ref 38.4–76.8)
PLATELETS: 72 10*3/uL — AB (ref 145–400)
RBC: 2.38 10*6/uL — ABNORMAL LOW (ref 3.70–5.45)
RDW: 14.2 % (ref 11.2–14.5)
WBC: 2.5 10*3/uL — ABNORMAL LOW (ref 3.9–10.3)
nRBC: 0 % (ref 0–0)

## 2013-10-04 ENCOUNTER — Telehealth: Payer: Self-pay | Admitting: *Deleted

## 2013-10-04 ENCOUNTER — Telehealth: Payer: Self-pay | Admitting: Oncology

## 2013-10-04 NOTE — Telephone Encounter (Signed)
received forwarded pt message from nurse. per pt someone trying to call her to r/s. s/w pt re add on wkly lb appts. pt given next appt for 1/12 and will get new schedule when she comes in.

## 2013-10-04 NOTE — Telephone Encounter (Signed)
Notified pt of blood counts per Dr Beryle Beams & need to go back to weekly cbc checks.  She has already been scheduled for 10/10/13.  She states she is feeling better.

## 2013-10-04 NOTE — Telephone Encounter (Signed)
lvm on home # with added lab d.t's...mobile # has another persons name

## 2013-10-04 NOTE — Telephone Encounter (Signed)
Message copied by Jesse Fall on Tue Oct 04, 2013  6:15 PM ------      Message from: Annia Belt      Created: Mon Oct 03, 2013  6:48 PM       Call pt: counts a little lower this week - I would like to put her back on weekly lab chacks - next on 1/12  pof done ------

## 2013-10-05 ENCOUNTER — Telehealth: Payer: Self-pay | Admitting: *Deleted

## 2013-10-05 NOTE — Telephone Encounter (Signed)
Reviewed patient's appointment times with dtr.

## 2013-10-10 ENCOUNTER — Other Ambulatory Visit (HOSPITAL_BASED_OUTPATIENT_CLINIC_OR_DEPARTMENT_OTHER): Payer: Medicare Other

## 2013-10-10 DIAGNOSIS — C9112 Chronic lymphocytic leukemia of B-cell type in relapse: Secondary | ICD-10-CM

## 2013-10-10 DIAGNOSIS — C9192 Lymphoid leukemia, unspecified, in relapse: Secondary | ICD-10-CM

## 2013-10-10 DIAGNOSIS — C8590 Non-Hodgkin lymphoma, unspecified, unspecified site: Secondary | ICD-10-CM

## 2013-10-10 LAB — CBC WITH DIFFERENTIAL/PLATELET
BASO%: 0.3 % (ref 0.0–2.0)
Basophils Absolute: 0 10*3/uL (ref 0.0–0.1)
EOS%: 4.6 % (ref 0.0–7.0)
Eosinophils Absolute: 0.1 10*3/uL (ref 0.0–0.5)
HCT: 29 % — ABNORMAL LOW (ref 34.8–46.6)
HGB: 10.1 g/dL — ABNORMAL LOW (ref 11.6–15.9)
LYMPH%: 43.8 % (ref 14.0–49.7)
MCH: 38.5 pg — ABNORMAL HIGH (ref 25.1–34.0)
MCHC: 34.8 g/dL (ref 31.5–36.0)
MCV: 110.7 fL — ABNORMAL HIGH (ref 79.5–101.0)
MONO#: 0.4 10*3/uL (ref 0.1–0.9)
MONO%: 13.8 % (ref 0.0–14.0)
NEUT#: 1.1 10*3/uL — ABNORMAL LOW (ref 1.5–6.5)
NEUT%: 37.5 % — ABNORMAL LOW (ref 38.4–76.8)
Platelets: 72 10*3/uL — ABNORMAL LOW (ref 145–400)
RBC: 2.62 10*6/uL — ABNORMAL LOW (ref 3.70–5.45)
RDW: 13.9 % (ref 11.2–14.5)
WBC: 3 10*3/uL — ABNORMAL LOW (ref 3.9–10.3)
lymph#: 1.3 10*3/uL (ref 0.9–3.3)

## 2013-10-11 ENCOUNTER — Telehealth: Payer: Self-pay | Admitting: *Deleted

## 2013-10-11 NOTE — Telephone Encounter (Signed)
Per MD; notified pt's daughter Edwena Bunde (ROI on file) wbc and hemoglobin better than last week; platelets the same.  Pt's daughter verbalized understanding and confirmed appt 10/17/13.

## 2013-10-11 NOTE — Telephone Encounter (Signed)
Message copied by Domenic Schwab on Tue Oct 11, 2013 12:49 PM ------      Message from: Annia Belt      Created: Mon Oct 10, 2013  6:17 PM       Call pt: white cells and hemoglobin better than last week; platelets the same-stable ------

## 2013-10-17 ENCOUNTER — Other Ambulatory Visit (HOSPITAL_BASED_OUTPATIENT_CLINIC_OR_DEPARTMENT_OTHER): Payer: Medicare Other

## 2013-10-17 ENCOUNTER — Other Ambulatory Visit: Payer: Medicare Other

## 2013-10-17 ENCOUNTER — Other Ambulatory Visit: Payer: Self-pay | Admitting: Oncology

## 2013-10-17 ENCOUNTER — Telehealth: Payer: Self-pay | Admitting: Oncology

## 2013-10-17 ENCOUNTER — Ambulatory Visit (HOSPITAL_BASED_OUTPATIENT_CLINIC_OR_DEPARTMENT_OTHER): Payer: Medicare Other | Admitting: Oncology

## 2013-10-17 VITALS — BP 181/48 | HR 64 | Temp 97.6°F | Resp 18 | Ht 62.0 in | Wt 249.4 lb

## 2013-10-17 DIAGNOSIS — C9192 Lymphoid leukemia, unspecified, in relapse: Secondary | ICD-10-CM

## 2013-10-17 DIAGNOSIS — C50919 Malignant neoplasm of unspecified site of unspecified female breast: Secondary | ICD-10-CM

## 2013-10-17 DIAGNOSIS — I10 Essential (primary) hypertension: Secondary | ICD-10-CM

## 2013-10-17 DIAGNOSIS — C9112 Chronic lymphocytic leukemia of B-cell type in relapse: Secondary | ICD-10-CM

## 2013-10-17 DIAGNOSIS — C8590 Non-Hodgkin lymphoma, unspecified, unspecified site: Secondary | ICD-10-CM

## 2013-10-17 DIAGNOSIS — C50912 Malignant neoplasm of unspecified site of left female breast: Secondary | ICD-10-CM

## 2013-10-17 DIAGNOSIS — D696 Thrombocytopenia, unspecified: Secondary | ICD-10-CM

## 2013-10-17 LAB — COMPREHENSIVE METABOLIC PANEL (CC13)
ALK PHOS: 67 U/L (ref 40–150)
ALT: 40 U/L (ref 0–55)
AST: 31 U/L (ref 5–34)
Albumin: 4.1 g/dL (ref 3.5–5.0)
Anion Gap: 10 mEq/L (ref 3–11)
BILIRUBIN TOTAL: 0.72 mg/dL (ref 0.20–1.20)
BUN: 19.3 mg/dL (ref 7.0–26.0)
CO2: 24 mEq/L (ref 22–29)
Calcium: 9.7 mg/dL (ref 8.4–10.4)
Chloride: 109 mEq/L (ref 98–109)
Creatinine: 1 mg/dL (ref 0.6–1.1)
Glucose: 106 mg/dl (ref 70–140)
Potassium: 4.4 mEq/L (ref 3.5–5.1)
Sodium: 143 mEq/L (ref 136–145)
Total Protein: 6.3 g/dL — ABNORMAL LOW (ref 6.4–8.3)

## 2013-10-17 LAB — CBC WITH DIFFERENTIAL/PLATELET
BASO%: 0.9 % (ref 0.0–2.0)
Basophils Absolute: 0 10*3/uL (ref 0.0–0.1)
EOS%: 4.7 % (ref 0.0–7.0)
Eosinophils Absolute: 0.2 10*3/uL (ref 0.0–0.5)
HEMATOCRIT: 28.9 % — AB (ref 34.8–46.6)
HGB: 10.1 g/dL — ABNORMAL LOW (ref 11.6–15.9)
LYMPH%: 34.3 % (ref 14.0–49.7)
MCH: 40.4 pg — ABNORMAL HIGH (ref 25.1–34.0)
MCHC: 34.8 g/dL (ref 31.5–36.0)
MCV: 116.3 fL — ABNORMAL HIGH (ref 79.5–101.0)
MONO#: 0.6 10*3/uL (ref 0.1–0.9)
MONO%: 15.8 % — ABNORMAL HIGH (ref 0.0–14.0)
NEUT#: 1.6 10*3/uL (ref 1.5–6.5)
NEUT%: 44.3 % (ref 38.4–76.8)
PLATELETS: 80 10*3/uL — AB (ref 145–400)
RBC: 2.49 10*6/uL — AB (ref 3.70–5.45)
RDW: 14.6 % — ABNORMAL HIGH (ref 11.2–14.5)
WBC: 3.5 10*3/uL — AB (ref 3.9–10.3)
lymph#: 1.2 10*3/uL (ref 0.9–3.3)

## 2013-10-17 LAB — LACTATE DEHYDROGENASE (CC13): LDH: 327 U/L — AB (ref 125–245)

## 2013-10-17 MED ORDER — FOLIC ACID 1 MG PO TABS: 1.0000 mg | ORAL_TABLET | Freq: Every day | ORAL | Status: DC

## 2013-10-17 NOTE — Telephone Encounter (Signed)
gave pt appt for lab and MD for february and on April with ML

## 2013-10-17 NOTE — Progress Notes (Signed)
Hematology and Oncology Follow Up Visit  [name] [id number] Jan 08, 1934 80 y.o. 10/17/2013 2:04 PM   Principle Diagnosis: Encounter Diagnoses  Name Primary?  . Breast cancer left T2N0 S/P mastectomy and SLN Bx 09/2010   . Low grade malignant lymphoma Yes  . CLL (chronic lymphoid leukemia) in relapse      Interim History:   Followup visit for this 80 year old woman with multiply relapsed well-differentiated lymphocytic lymphoma/CLL initially diagnosed in November 2009. See recent summary note dated September 17 for full details of previous diagnoses and treatment. Blood counts began to deteriorate again rapidly at the beginning of August. She had a brief trial of ibrutinib stopped after just 3 weeks for profound fatigue and rapidly falling platelet count which got down to 3000 by September 12. She was started on Amicar. She received daily platelet transfusions with poor response and HLA platelets were obtained. I started her on a salvage regimen with one of the new anti-CD20 antibodies, Obinutuzumab along with daily, oral, chlorambucil at a dose of 4 mg. Leukeran started on September 22. First dose of the Obinutuzumab started on September 24.  She had nothing short of a miraculous early response with a rise in her platelet count up to 42,000, a reversal of her white count differential with a fall and lymphocytes from 80% of the differential down to 55% and rise in her neutrophil percentage up to 40%. Unfortunately, with the third dose of the antibody given on October 8, she developed significant chest tightness requiring hospital admission for observation. She did rule out for MI. However I am reluctant to give her this drug again. I have continued her on low dose daily oral Leukeran 2 mg. Her blood counts have stabilized and I was pleasantly surprised to see further improvement today. Hemoglobin stable at 10.1 g. White count is up to 3500 with 44% neutrophils 34% lymphocytes 16 monocytes,  platelet count up to 80,000. She has had no interim medical problems. No infections. She did have a mild case of bronchitis over the holidays which has resolved. She has not had any recent neurologic symptoms. She does have problems with intermittent headaches and vertigo in the past.   Medications: reviewed  Allergies:  Allergies  Allergen Reactions  . Latex   . Obinutuzumab   . Penicillins     Review of Systems: Hematology: No bleeding or bruising ENT ROS: No sore throat Breast ROS:  Respiratory ROS: No cough or dyspnea Cardiovascular ROS:  No chest pain or palpitations Gastrointestinal ROS:   No abdominal pain or change in bowel habit Genito-Urinary ROS: Not questioned Musculoskeletal ROS: No muscle or bone pain Neurological ROS no recent TIA symptoms. Dermatological ROS: Chronic dermatitis Remaining ROS negative.  Physical Exam: Blood pressure 181/48, pulse 64, temperature 97.6 F (36.4 C), temperature source Oral, resp. rate 18, height 5' 2"  (1.575 m), weight 249 lb 6.4 oz (113.127 kg). Wt Readings from Last 3 Encounters:  10/17/13 249 lb 6.4 oz (113.127 kg)  09/05/13 251 lb 12.8 oz (114.216 kg)  07/29/13 253 lb 8 oz (114.987 kg)     General appearance: Obese African American woman HENNT: Pharynx no erythema, exudate, mass, or ulcer. No thyromegaly or thyroid nodules Lymph nodes: No cervical, supraclavicular, or right axillary lymphadenopathy. Approximate 2 cm node in the left axilla. Breasts: Left mastectomy Lungs: Clear to auscultation, resonant to percussion throughout Heart: Regular rhythm, no murmur, no gallop, no rub, no click, no edema Abdomen: Soft, nontender, normal bowel sounds, no mass, no  organomegaly Extremities: No edema, no calf tenderness Musculoskeletal: no joint deformities GU:  Vascular: Carotid pulses 2+, no bruits, Neurologic: Alert, oriented, PERRLA, I was unable to visualize the fundi , cranial nerves grossly normal, motor strength 5 over 5,  reflexes 1+ symmetric at the biceps, absent symmetric at the knees, upper body coordination normal, gait normal, Skin: No rash or ecchymosis  Lab Results: CBC W/Diff    Component Value Date/Time   WBC 3.5* 10/17/2013 0926   WBC 6.6 07/07/2013 0432   RBC 2.49* 10/17/2013 0926   RBC 2.70* 07/07/2013 0432   HGB 10.1* 10/17/2013 0926   HGB 10.0* 07/07/2013 0432   HCT 28.9* 10/17/2013 0926   HCT 28.5* 07/07/2013 0432   PLT 80* 10/17/2013 0926   PLT 41* 07/07/2013 0432   MCV 116.3* 10/17/2013 0926   MCV 105.6* 07/07/2013 0432   MCH 40.4* 10/17/2013 0926   MCH 37.0* 07/07/2013 0432   MCHC 34.8 10/17/2013 0926   MCHC 35.1 07/07/2013 0432   RDW 14.6* 10/17/2013 0926   RDW 20.8* 07/07/2013 0432   LYMPHSABS 1.2 10/17/2013 0926   LYMPHSABS 6.2* 06/11/2013 0810   MONOABS 0.6 10/17/2013 0926   MONOABS 0.3 06/11/2013 0810   EOSABS 0.2 10/17/2013 0926   EOSABS 0.0 06/11/2013 0810   BASOSABS 0.0 10/17/2013 0926   BASOSABS 0.0 06/11/2013 0810     Chemistry      Component Value Date/Time   NA 143 10/17/2013 0926   NA 134* 07/07/2013 0432   NA 142 01/01/2011   K 4.4 10/17/2013 0926   K 4.5 07/07/2013 0432   CL 102 07/07/2013 0432   CL 107 02/25/2013 1028   CO2 24 10/17/2013 0926   CO2 22 07/07/2013 0432   BUN 19.3 10/17/2013 0926   BUN 16 07/07/2013 0432   BUN 16 01/01/2011   CREATININE 1.0 10/17/2013 0926   CREATININE 0.96 07/07/2013 0432   CREATININE 0.9 01/01/2011   GLU 104 01/01/2011      Component Value Date/Time   CALCIUM 9.7 10/17/2013 0926   CALCIUM 9.0 07/07/2013 0432   ALKPHOS 67 10/17/2013 0926   ALKPHOS 76 05/27/2013 1226   AST 31 10/17/2013 0926   AST 28 05/27/2013 1226   ALT 40 10/17/2013 0926   ALT 24 05/27/2013 1226   BILITOT 0.72 10/17/2013 0926   BILITOT 0.5 05/27/2013 1226        Impression: #1. Multiply relapsed WDLL/CLL Blood counts currently stable with a nice partial remission after 3 doses of Gazyva and oral chlorambucil. [name] stopped for allergic reaction. She continues on low-dose oral  chlorambucil 2 mg daily. In the future, treatment options would include a repeat trial of ibrutinib or a trial of idelalisib. For now I will continue the chlorambucil.  #2. Multifocal invasive and noninvasive estrogen receptor positive cancer of the left breast status post left mastectomy 10/09/2010. She continues on tamoxifen hormonal therapy.  #3. Essential hypertension  #4. Recurrent atypical neurologic symptoms currently stable    CC: Patient Care Team: Rowe Clack, MD as PCP - General (Internal Medicine) Annia Belt, MD (Hematology and Oncology) Edward Jolly, MD (General Surgery) Jerene Bears, MD (Gastroenterology)   Annia Belt, MD 1/19/20152:04 PM

## 2013-10-21 ENCOUNTER — Other Ambulatory Visit: Payer: Self-pay | Admitting: Oncology

## 2013-10-24 ENCOUNTER — Other Ambulatory Visit (HOSPITAL_BASED_OUTPATIENT_CLINIC_OR_DEPARTMENT_OTHER): Payer: Medicare Other

## 2013-10-24 ENCOUNTER — Ambulatory Visit (HOSPITAL_BASED_OUTPATIENT_CLINIC_OR_DEPARTMENT_OTHER): Payer: Medicare Other

## 2013-10-24 VITALS — BP 190/70 | HR 70 | Temp 98.1°F

## 2013-10-24 DIAGNOSIS — Z452 Encounter for adjustment and management of vascular access device: Secondary | ICD-10-CM

## 2013-10-24 DIAGNOSIS — Z95828 Presence of other vascular implants and grafts: Secondary | ICD-10-CM

## 2013-10-24 DIAGNOSIS — C9112 Chronic lymphocytic leukemia of B-cell type in relapse: Secondary | ICD-10-CM

## 2013-10-24 DIAGNOSIS — C9192 Lymphoid leukemia, unspecified, in relapse: Secondary | ICD-10-CM

## 2013-10-24 DIAGNOSIS — C8590 Non-Hodgkin lymphoma, unspecified, unspecified site: Secondary | ICD-10-CM

## 2013-10-24 LAB — CBC WITH DIFFERENTIAL/PLATELET
BASO%: 1 % (ref 0.0–2.0)
Basophils Absolute: 0 10*3/uL (ref 0.0–0.1)
EOS%: 3.9 % (ref 0.0–7.0)
Eosinophils Absolute: 0.1 10*3/uL (ref 0.0–0.5)
HCT: 28.6 % — ABNORMAL LOW (ref 34.8–46.6)
HGB: 10.1 g/dL — ABNORMAL LOW (ref 11.6–15.9)
LYMPH%: 32.6 % (ref 14.0–49.7)
MCH: 40.8 pg — ABNORMAL HIGH (ref 25.1–34.0)
MCHC: 35.4 g/dL (ref 31.5–36.0)
MCV: 115.3 fL — ABNORMAL HIGH (ref 79.5–101.0)
MONO#: 0.6 10*3/uL (ref 0.1–0.9)
MONO%: 16.3 % — AB (ref 0.0–14.0)
NEUT#: 1.7 10*3/uL (ref 1.5–6.5)
NEUT%: 46.2 % (ref 38.4–76.8)
Platelets: 94 10*3/uL — ABNORMAL LOW (ref 145–400)
RBC: 2.48 10*6/uL — AB (ref 3.70–5.45)
RDW: 15.4 % — AB (ref 11.2–14.5)
WBC: 3.8 10*3/uL — AB (ref 3.9–10.3)
lymph#: 1.2 10*3/uL (ref 0.9–3.3)

## 2013-10-24 MED ORDER — HEPARIN SOD (PORK) LOCK FLUSH 100 UNIT/ML IV SOLN
500.0000 [IU] | Freq: Once | INTRAVENOUS | Status: AC
  Administered 2013-10-24: 500 [IU] via INTRAVENOUS
  Filled 2013-10-24: qty 5

## 2013-10-24 MED ORDER — SODIUM CHLORIDE 0.9 % IJ SOLN
10.0000 mL | INTRAMUSCULAR | Status: DC | PRN
  Administered 2013-10-24: 10 mL via INTRAVENOUS
  Filled 2013-10-24: qty 10

## 2013-10-24 NOTE — Patient Instructions (Signed)

## 2013-10-25 ENCOUNTER — Other Ambulatory Visit: Payer: Self-pay | Admitting: Oncology

## 2013-10-31 ENCOUNTER — Other Ambulatory Visit: Payer: Self-pay | Admitting: Oncology

## 2013-11-06 ENCOUNTER — Other Ambulatory Visit: Payer: Self-pay | Admitting: Oncology

## 2013-11-20 ENCOUNTER — Other Ambulatory Visit: Payer: Self-pay | Admitting: Oncology

## 2013-11-22 ENCOUNTER — Ambulatory Visit: Payer: Medicare Other | Admitting: Oncology

## 2013-11-22 ENCOUNTER — Other Ambulatory Visit: Payer: Self-pay | Admitting: Oncology

## 2013-11-22 ENCOUNTER — Other Ambulatory Visit: Payer: Medicare Other

## 2013-11-22 DIAGNOSIS — C9192 Lymphoid leukemia, unspecified, in relapse: Secondary | ICD-10-CM

## 2013-11-22 DIAGNOSIS — C8590 Non-Hodgkin lymphoma, unspecified, unspecified site: Secondary | ICD-10-CM

## 2013-11-22 DIAGNOSIS — C9112 Chronic lymphocytic leukemia of B-cell type in relapse: Secondary | ICD-10-CM

## 2013-11-23 ENCOUNTER — Telehealth: Payer: Self-pay | Admitting: Oncology

## 2013-11-23 NOTE — Telephone Encounter (Signed)
S/w the pt and she is aware of her march appts.

## 2013-11-28 ENCOUNTER — Other Ambulatory Visit: Payer: Self-pay | Admitting: Internal Medicine

## 2013-11-28 NOTE — Telephone Encounter (Signed)
Last OV with you 10/14--please advise

## 2013-11-30 ENCOUNTER — Telehealth: Payer: Self-pay | Admitting: Oncology

## 2013-11-30 ENCOUNTER — Ambulatory Visit (HOSPITAL_BASED_OUTPATIENT_CLINIC_OR_DEPARTMENT_OTHER): Payer: Medicare Other | Admitting: Oncology

## 2013-11-30 ENCOUNTER — Other Ambulatory Visit (HOSPITAL_BASED_OUTPATIENT_CLINIC_OR_DEPARTMENT_OTHER): Payer: Medicare Other

## 2013-11-30 VITALS — BP 161/58 | HR 68 | Temp 97.1°F | Resp 17 | Ht 62.0 in | Wt 254.5 lb

## 2013-11-30 DIAGNOSIS — C50919 Malignant neoplasm of unspecified site of unspecified female breast: Secondary | ICD-10-CM

## 2013-11-30 DIAGNOSIS — C8590 Non-Hodgkin lymphoma, unspecified, unspecified site: Secondary | ICD-10-CM

## 2013-11-30 DIAGNOSIS — L259 Unspecified contact dermatitis, unspecified cause: Secondary | ICD-10-CM

## 2013-11-30 DIAGNOSIS — C8589 Other specified types of non-Hodgkin lymphoma, extranodal and solid organ sites: Secondary | ICD-10-CM

## 2013-11-30 DIAGNOSIS — C9112 Chronic lymphocytic leukemia of B-cell type in relapse: Secondary | ICD-10-CM

## 2013-11-30 DIAGNOSIS — C9192 Lymphoid leukemia, unspecified, in relapse: Secondary | ICD-10-CM

## 2013-11-30 DIAGNOSIS — I10 Essential (primary) hypertension: Secondary | ICD-10-CM

## 2013-11-30 LAB — CBC WITH DIFFERENTIAL/PLATELET
BASO%: 1.3 % (ref 0.0–2.0)
Basophils Absolute: 0.1 10*3/uL (ref 0.0–0.1)
EOS%: 8 % — ABNORMAL HIGH (ref 0.0–7.0)
Eosinophils Absolute: 0.3 10*3/uL (ref 0.0–0.5)
HEMATOCRIT: 27.4 % — AB (ref 34.8–46.6)
HGB: 9.8 g/dL — ABNORMAL LOW (ref 11.6–15.9)
LYMPH#: 1.5 10*3/uL (ref 0.9–3.3)
LYMPH%: 39.7 % (ref 14.0–49.7)
MCH: 39.4 pg — AB (ref 25.1–34.0)
MCHC: 35.8 g/dL (ref 31.5–36.0)
MCV: 110 fL — ABNORMAL HIGH (ref 79.5–101.0)
MONO#: 0.5 10*3/uL (ref 0.1–0.9)
MONO%: 14.4 % — ABNORMAL HIGH (ref 0.0–14.0)
NEUT#: 1.4 10*3/uL — ABNORMAL LOW (ref 1.5–6.5)
NEUT%: 36.6 % — ABNORMAL LOW (ref 38.4–76.8)
Platelets: 85 10*3/uL — ABNORMAL LOW (ref 145–400)
RBC: 2.49 10*6/uL — ABNORMAL LOW (ref 3.70–5.45)
RDW: 14.5 % (ref 11.2–14.5)
WBC: 3.8 10*3/uL — ABNORMAL LOW (ref 3.9–10.3)
nRBC: 0 % (ref 0–0)

## 2013-11-30 LAB — COMPREHENSIVE METABOLIC PANEL (CC13)
ALT: 22 U/L (ref 0–55)
AST: 23 U/L (ref 5–34)
Albumin: 4 g/dL (ref 3.5–5.0)
Alkaline Phosphatase: 67 U/L (ref 40–150)
Anion Gap: 8 mEq/L (ref 3–11)
BILIRUBIN TOTAL: 0.69 mg/dL (ref 0.20–1.20)
BUN: 15.3 mg/dL (ref 7.0–26.0)
CO2: 24 mEq/L (ref 22–29)
CREATININE: 1.1 mg/dL (ref 0.6–1.1)
Calcium: 9.7 mg/dL (ref 8.4–10.4)
Chloride: 111 mEq/L — ABNORMAL HIGH (ref 98–109)
Glucose: 129 mg/dl (ref 70–140)
POTASSIUM: 4.5 meq/L (ref 3.5–5.1)
Sodium: 143 mEq/L (ref 136–145)
Total Protein: 6.2 g/dL — ABNORMAL LOW (ref 6.4–8.3)

## 2013-11-30 LAB — LACTATE DEHYDROGENASE (CC13): LDH: 364 U/L — ABNORMAL HIGH (ref 125–245)

## 2013-11-30 NOTE — Progress Notes (Signed)
Hematology and Oncology Follow Up Visit  [name] [id number] [birth year] 80 y.o. 11/30/2013 6:01 PM   Principle Diagnosis: Encounter Diagnoses  Name Primary?  . Breast cancer left T2N0 S/P mastectomy and SLN Bx 09/2010 Yes  . Low grade malignant lymphoma   . CLL (chronic lymphoid leukemia) in relapse      Interim History: Followup visit for this pleasant 80 year old woman with multiply relapsed well-differentiated lymphocytic lymphoma/CLL initially diagnosed in November 2009. See recent summary note dated September 17 for full details of previous diagnoses and treatment. Blood counts began to deteriorate again rapidly at the beginning of August,2014. She had a brief trial of ibrutinib stopped after just 3 weeks for profound fatigue and rapidly falling platelet count which got down to 3000 by September 12. She was started on Amicar. She received daily platelet transfusions with poor response and HLA platelets were obtained. I started her on a salvage regimen with one of the new anti-CD20 antibodies, Obinutuzumab along with daily, oral, chlorambucil at a dose of 4 mg. Leukeran started on September 22. First dose of the Obinutuzumab started on September 24.  She had nothing short of a miraculous early response with a rise in her platelet count up to 42,000, a reversal of her white count differential with a fall and lymphocytes from 80% of the differential down to 55% and rise in her neutrophil percentage up to 40%. Unfortunately, with the third dose of the antibody given on October 8, she developed significant chest tightness requiring hospital admission for observation. She did rule out for MI. However I am reluctant to give her this drug again.  I have continued her on low dose daily oral Leukeran 2 mg. Her blood counts have stabilized.  Platelet count is holding at about 80,000. Hemoglobin at 10 g. White count currently 3800 with 37% neutrophils, 40% lymphocytes.  She has had no interim medical  problems. No infections.  She is having a flareup of her chronic dermatitis. She denies any recurrent atypical neurologic symptoms.   Medications: reviewed  Allergies:  Allergies  Allergen Reactions  . Latex   . Obinutuzumab   . Penicillins     Review of Systems: Hematology:  No bleeding or bruising ENT ROS: No sore throats Breast ROS: No new breast lumps Respiratory ROS: No cough or dyspnea Cardiovascular ROS:  No chest pain or palpitations Gastrointestinal ROS: No abdominal pain or change in bowel habit   Genito-Urinary ROS: No urinary tract symptoms  Musculoskeletal ROS: No muscle bone or joint pain Neurological ROS: No headache. No recurrent atypical vertigo spells.  Dermatological ROS: Chronic dermatitis. She believes active lesions when they appear coincide with pain in her fingers. Remaining ROS negative:   Physical Exam: Blood pressure 161/58, pulse 68, temperature 97.1 F (36.2 C), temperature source Oral, resp. rate 17, height 5' 2"  (1.575 m), weight 254 lb 8 oz (115.44 kg), SpO2 99.00%. Wt Readings from Last 3 Encounters:  11/30/13 254 lb 8 oz (115.44 kg)  10/17/13 249 lb 6.4 oz (113.127 kg)  09/05/13 251 lb 12.8 oz (114.216 kg)     General appearance: Pleasant, obese, African American woman HENNT: Pharynx no erythema, exudate, mass, or ulcer. No thyromegaly or thyroid nodules Lymph nodes: No cervical, supraclavicular, or axillary lymphadenopathy Breasts: Left mastectomy. No chest wall lesions. No right breast masses. Lungs: Clear to auscultation, resonant to percussion throughout Heart: Regular rhythm, no murmur, no gallop, no rub, no click, no edema Abdomen: Soft, nontender, normal bowel sounds, no mass, no  organomegaly Extremities: No edema, no calf tenderness Musculoskeletal: no joint deformities GU:  Vascular: Carotid pulses 2+, no bruits,  Neurologic: Alert, oriented, PERRLA,  , cranial nerves grossly normal, motor strength 5 over 5, reflexes 1+  symmetric, upper body coordination normal, gait normal, Skin: Chronic, hyperpigmented, dermatitis with scattered actinic lesions on her arms.  Lab Results: CBC W/Diff    Component Value Date/Time   WBC 3.8* 11/30/2013 1034   WBC 6.6 07/07/2013 0432   RBC 2.49* 11/30/2013 1034   RBC 2.70* 07/07/2013 0432   HGB 9.8* 11/30/2013 1034   HGB 10.0* 07/07/2013 0432   HCT 27.4* 11/30/2013 1034   HCT 28.5* 07/07/2013 0432   PLT 85* 11/30/2013 1034   PLT 41* 07/07/2013 0432   MCV 110.0* 11/30/2013 1034   MCV 105.6* 07/07/2013 0432   MCH 39.4* 11/30/2013 1034   MCH 37.0* 07/07/2013 0432   MCHC 35.8 11/30/2013 1034   MCHC 35.1 07/07/2013 0432   RDW 14.5 11/30/2013 1034   RDW 20.8* 07/07/2013 0432   LYMPHSABS 1.5 11/30/2013 1034   LYMPHSABS 6.2* 06/11/2013 0810   MONOABS 0.5 11/30/2013 1034   MONOABS 0.3 06/11/2013 0810   EOSABS 0.3 11/30/2013 1034   EOSABS 0.0 06/11/2013 0810   BASOSABS 0.1 11/30/2013 1034   BASOSABS 0.0 06/11/2013 0810     Chemistry      Component Value Date/Time   NA 143 11/30/2013 1034   NA 134* 07/07/2013 0432   NA 142 01/01/2011   K 4.5 11/30/2013 1034   K 4.5 07/07/2013 0432   CL 102 07/07/2013 0432   CL 107 02/25/2013 1028   CO2 24 11/30/2013 1034   CO2 22 07/07/2013 0432   BUN 15.3 11/30/2013 1034   BUN 16 07/07/2013 0432   BUN 16 01/01/2011   CREATININE 1.1 11/30/2013 1034   CREATININE 0.96 07/07/2013 0432   CREATININE 0.9 01/01/2011   GLU 104 01/01/2011      Component Value Date/Time   CALCIUM 9.7 11/30/2013 1034   CALCIUM 9.0 07/07/2013 0432   ALKPHOS 67 11/30/2013 1034   ALKPHOS 76 05/27/2013 1226   AST 23 11/30/2013 1034   AST 28 05/27/2013 1226   ALT 22 11/30/2013 1034   ALT 24 05/27/2013 1226   BILITOT 0.69 11/30/2013 1034   BILITOT 0.5 05/27/2013 1226        Impression:   #1. Multiply relapsed WDLL/CLL  Blood counts currently stable with a nice partial remission after 3 doses of Gazyva and oral chlorambucil. [name] stopped for allergic reaction. She continues on low-dose oral chlorambucil 2 mg daily.   In the future, treatment options would include a repeat trial of ibrutinib or a trial of idelalisib. For now I will continue the chlorambucil.   #2. Multifocal invasive and noninvasive estrogen receptor positive cancer of the left breast status post left mastectomy 10/09/2010.  She continues on tamoxifen hormonal therapy.   #3. Essential hypertension   #4. Recurrent atypical neurologic symptoms with intermittent dizziness, vertigo, and paresthesias, currently stable  #5. Chronic dermatitis  I will transition her care to Dr. Benay Spice   CC: Patient Care Team: Rowe Clack, MD as PCP - General (Internal Medicine) Annia Belt, MD (Hematology and Oncology) Edward Jolly, MD (General Surgery) Jerene Bears, MD (Gastroenterology)   Annia Belt, MD 3/4/20156:01 PM

## 2013-11-30 NOTE — Telephone Encounter (Signed)
Gave pt appt for lab and Md for APril, mammogram [facility]

## 2013-12-09 ENCOUNTER — Other Ambulatory Visit: Payer: Self-pay | Admitting: Internal Medicine

## 2013-12-13 ENCOUNTER — Other Ambulatory Visit: Payer: Self-pay | Admitting: Oncology

## 2013-12-13 DIAGNOSIS — C9112 Chronic lymphocytic leukemia of B-cell type in relapse: Secondary | ICD-10-CM

## 2014-01-07 ENCOUNTER — Other Ambulatory Visit: Payer: Self-pay | Admitting: Oncology

## 2014-01-09 ENCOUNTER — Ambulatory Visit (HOSPITAL_BASED_OUTPATIENT_CLINIC_OR_DEPARTMENT_OTHER): Payer: Medicare Other | Admitting: Nurse Practitioner

## 2014-01-09 ENCOUNTER — Telehealth: Payer: Self-pay | Admitting: Oncology

## 2014-01-09 ENCOUNTER — Ambulatory Visit: Payer: Medicare Other | Admitting: Nurse Practitioner

## 2014-01-09 ENCOUNTER — Other Ambulatory Visit (HOSPITAL_BASED_OUTPATIENT_CLINIC_OR_DEPARTMENT_OTHER): Payer: Medicare Other

## 2014-01-09 ENCOUNTER — Ambulatory Visit: Payer: Medicare Other

## 2014-01-09 VITALS — BP 190/81 | HR 84 | Temp 97.0°F | Resp 20 | Ht 62.0 in | Wt 258.4 lb

## 2014-01-09 DIAGNOSIS — D696 Thrombocytopenia, unspecified: Secondary | ICD-10-CM

## 2014-01-09 DIAGNOSIS — C8590 Non-Hodgkin lymphoma, unspecified, unspecified site: Secondary | ICD-10-CM

## 2014-01-09 DIAGNOSIS — C9192 Lymphoid leukemia, unspecified, in relapse: Secondary | ICD-10-CM

## 2014-01-09 DIAGNOSIS — C50919 Malignant neoplasm of unspecified site of unspecified female breast: Secondary | ICD-10-CM

## 2014-01-09 DIAGNOSIS — D709 Neutropenia, unspecified: Secondary | ICD-10-CM

## 2014-01-09 DIAGNOSIS — C9112 Chronic lymphocytic leukemia of B-cell type in relapse: Secondary | ICD-10-CM

## 2014-01-09 DIAGNOSIS — C8589 Other specified types of non-Hodgkin lymphoma, extranodal and solid organ sites: Secondary | ICD-10-CM

## 2014-01-09 LAB — CBC WITH DIFFERENTIAL/PLATELET
BASO%: 2.7 % — AB (ref 0.0–2.0)
Basophils Absolute: 0.1 10*3/uL (ref 0.0–0.1)
EOS ABS: 0.4 10*3/uL (ref 0.0–0.5)
EOS%: 12.3 % — ABNORMAL HIGH (ref 0.0–7.0)
HCT: 30.8 % — ABNORMAL LOW (ref 34.8–46.6)
HGB: 10.9 g/dL — ABNORMAL LOW (ref 11.6–15.9)
LYMPH#: 1.4 10*3/uL (ref 0.9–3.3)
LYMPH%: 49 % (ref 14.0–49.7)
MCH: 39.4 pg — ABNORMAL HIGH (ref 25.1–34.0)
MCHC: 35.4 g/dL (ref 31.5–36.0)
MCV: 111.2 fL — ABNORMAL HIGH (ref 79.5–101.0)
MONO#: 0.5 10*3/uL (ref 0.1–0.9)
MONO%: 17.5 % — ABNORMAL HIGH (ref 0.0–14.0)
NEUT%: 18.5 % — ABNORMAL LOW (ref 38.4–76.8)
NEUTROS ABS: 0.5 10*3/uL — AB (ref 1.5–6.5)
NRBC: 0 % (ref 0–0)
Platelets: 73 10*3/uL — ABNORMAL LOW (ref 145–400)
RBC: 2.77 10*6/uL — AB (ref 3.70–5.45)
RDW: 14 % (ref 11.2–14.5)
WBC: 2.9 10*3/uL — ABNORMAL LOW (ref 3.9–10.3)

## 2014-01-09 MED ORDER — SODIUM CHLORIDE 0.9 % IJ SOLN
10.0000 mL | INTRAMUSCULAR | Status: DC | PRN
  Administered 2014-01-09: 10 mL via INTRAVENOUS
  Filled 2014-01-09: qty 10

## 2014-01-09 MED ORDER — HEPARIN SOD (PORK) LOCK FLUSH 100 UNIT/ML IV SOLN
500.0000 [IU] | Freq: Once | INTRAVENOUS | Status: AC
  Administered 2014-01-09: 500 [IU] via INTRAVENOUS
  Filled 2014-01-09: qty 5

## 2014-01-09 NOTE — Progress Notes (Signed)
Attempted to access patient's port.  Patient moved back and loss access. Patient then stated that she did not want to be accessed at this time.

## 2014-01-09 NOTE — Telephone Encounter (Signed)
gv pt appt schedule for april/may °

## 2014-01-09 NOTE — Progress Notes (Addendum)
[facility] OFFICE PROGRESS NOTE   Diagnosis:  WDLL/CLL and breast cancer.  INTERVAL HISTORY:   Ms. besecker returns for scheduled followup. She continues chlorambucil 2 mg daily. She overall has been feeling well. No nausea or vomiting. No mouth sores. No diarrhea. No fever. She has periodic sweats. She denies bleeding. Stable chronic dermatitis skin changes. Following placement of the blood pressure cuff at the right upper arm she noted redness of the right forearm and palm.  Objective:  Vital signs in last 24 hours:  Blood pressure 190/81, pulse 84, temperature 97 F (36.1 C), temperature source Oral, resp. rate 20, height 5\' 2"  (1.575 m), weight 258 lb 6.4 oz (117.209 kg), SpO2 100.00%.    HEENT: No thrush or ulcerations. Lymphatics: No palpable cervical, supraclavicular, axillary or inguinal lymph nodes. Resp: Lungs clear. Cardio: Regular cardiac rhythm. GI: Abdomen soft and nontender. No organomegaly. Vascular: No leg edema.  Skin: Right forearm erythematous and warm. Right palm erythematous.  Breast: Status post left mastectomy. No evidence of chest wall recurrence.  Portacath/PICC-without erythema.  Lab Results:  Lab Results  Component Value Date   WBC 2.9* 01/09/2014   HGB 10.9* 01/09/2014   HCT 30.8* 01/09/2014   MCV 111.2* 01/09/2014   PLT 73* 01/09/2014   NEUTROABS 0.5* 01/09/2014      Imaging:  No results found.  Medications: I have reviewed the patient's current medications.  Assessment/Plan: 1. Relapsed CLL/well-differentiated lymphocytic lymphoma. She began a trial of Ibrutinib 05/27/2013. It was stopped on 06/10/2013 due to profound fatigue, nausea and anorexia. In addition, labs on 06/10/2013 showed significant deterioration in blood counts with the platelet count down to 3000, felt to be secondary to disease progression as this was how she initially presented. Treatment was initiated with Obinutuzumab and Leukeran on 06/23/2013. Blood counts  improved. Obinutuzumab discontinued following cycle 3 due to development of chest tightness requiring hospitalization. She ruled out for an MI. Low dose daily chlorambucil continued. 2. Extreme fatigue, anorexia while on Ibrutinib. 3. Multifocal invasive and noninvasive cancers left breast status post mastectomy 10/09/2010 currently on tamoxifen. 4. Hypertension. 5. Recurrent atypical neurologic symptoms with dizziness, vertigo, paresthesias. 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 Mild changes in small vessels. No acute changes. Aspirin placed on hold due to severe thrombocytopenia. 6. Erythema/warmth right forearm and palm.  7. Neutropenia/thrombocytopenia.   Disposition: She is currently on chlorambucil 2 mg daily. She has developed neutropenia/thrombocytopenia. Dr. Benay Spice recommends placing the chlorambucil on hold. We will begin weekly CBCs.  The right forearm and palm are erythematous and warm. This improved during the course of her visit. Question reaction to the blood pressure cuff. She understands to contact the office if she develops fever, chills, increased/persistent erythema.  We will see her in followup in one month. She will contact the office in the interim as outlined above or with any other problems.    Patient seen with Dr. Benay Spice. 25 minutes were spent face-to-face at today's visit with the majority of that time involved in counseling/coordination of care.      Owens Shark ANP/GNP-BC   01/09/2014  11:45 AM This was a shared visit with Ned Card. The neutropenia is most likely secondary to chlorambucil versus disease progression. She will hold the chlorambucil and return for a CBC  in one week. She is scheduled for an office visit in one month. She knows to contact us for a fever or signs of an infection.

## 2014-01-11 ENCOUNTER — Ambulatory Visit
Admission: RE | Admit: 2014-01-11 | Discharge: 2014-01-11 | Disposition: A | Discharge status: Home / Self Care | Payer: Medicare Other | Source: Ambulatory Visit | Attending: Oncology | Admitting: Oncology

## 2014-01-11 DIAGNOSIS — C50919 Malignant neoplasm of unspecified site of unspecified female breast: Secondary | ICD-10-CM

## 2014-01-14 ENCOUNTER — Other Ambulatory Visit: Payer: Self-pay | Admitting: Internal Medicine

## 2014-01-14 ENCOUNTER — Other Ambulatory Visit: Payer: Self-pay | Admitting: Oncology

## 2014-01-16 ENCOUNTER — Other Ambulatory Visit (HOSPITAL_BASED_OUTPATIENT_CLINIC_OR_DEPARTMENT_OTHER): Payer: Medicare Other

## 2014-01-16 ENCOUNTER — Telehealth: Payer: Self-pay | Admitting: *Deleted

## 2014-01-16 ENCOUNTER — Other Ambulatory Visit: Payer: Self-pay

## 2014-01-16 ENCOUNTER — Ambulatory Visit: Payer: Medicare Other

## 2014-01-16 DIAGNOSIS — Z95828 Presence of other vascular implants and grafts: Secondary | ICD-10-CM

## 2014-01-16 DIAGNOSIS — C9112 Chronic lymphocytic leukemia of B-cell type in relapse: Secondary | ICD-10-CM

## 2014-01-16 DIAGNOSIS — C9212 Chronic myeloid leukemia, BCR/ABL-positive, in relapse: Secondary | ICD-10-CM

## 2014-01-16 DIAGNOSIS — C8589 Other specified types of non-Hodgkin lymphoma, extranodal and solid organ sites: Secondary | ICD-10-CM

## 2014-01-16 DIAGNOSIS — C9192 Lymphoid leukemia, unspecified, in relapse: Secondary | ICD-10-CM

## 2014-01-16 DIAGNOSIS — C8590 Non-Hodgkin lymphoma, unspecified, unspecified site: Secondary | ICD-10-CM

## 2014-01-16 LAB — CBC WITH DIFFERENTIAL/PLATELET
BASO%: 2.3 % — ABNORMAL HIGH (ref 0.0–2.0)
BASOS ABS: 0.1 10*3/uL (ref 0.0–0.1)
EOS%: 8.9 % — ABNORMAL HIGH (ref 0.0–7.0)
Eosinophils Absolute: 0.3 10*3/uL (ref 0.0–0.5)
HCT: 30 % — ABNORMAL LOW (ref 34.8–46.6)
HEMOGLOBIN: 10.4 g/dL — AB (ref 11.6–15.9)
LYMPH%: 57.1 % — AB (ref 14.0–49.7)
MCH: 40.5 pg — ABNORMAL HIGH (ref 25.1–34.0)
MCHC: 34.5 g/dL (ref 31.5–36.0)
MCV: 117.3 fL — AB (ref 79.5–101.0)
MONO#: 0.4 10*3/uL (ref 0.1–0.9)
MONO%: 13.1 % (ref 0.0–14.0)
NEUT#: 0.5 10*3/uL — CL (ref 1.5–6.5)
NEUT%: 18.6 % — ABNORMAL LOW (ref 38.4–76.8)
Platelets: 72 10*3/uL — ABNORMAL LOW (ref 145–400)
RBC: 2.56 10*6/uL — ABNORMAL LOW (ref 3.70–5.45)
RDW: 15.1 % — ABNORMAL HIGH (ref 11.2–14.5)
WBC: 2.9 10*3/uL — ABNORMAL LOW (ref 3.9–10.3)
lymph#: 1.7 10*3/uL (ref 0.9–3.3)

## 2014-01-16 MED ORDER — SODIUM CHLORIDE 0.9 % IJ SOLN
10.0000 mL | INTRAMUSCULAR | Status: DC | PRN
  Administered 2014-01-16: 10 mL via INTRAVENOUS
  Filled 2014-01-16: qty 10

## 2014-01-16 MED ORDER — HEPARIN SOD (PORK) LOCK FLUSH 100 UNIT/ML IV SOLN
500.0000 [IU] | Freq: Once | INTRAVENOUS | Status: AC
  Administered 2014-01-16: 500 [IU] via INTRAVENOUS
  Filled 2014-01-16: qty 5

## 2014-01-16 NOTE — Telephone Encounter (Signed)
Per Dr. Benay Spice; notified pt and pt's daughter of labs today ANC 0.5.  Neutropenic precautions reviewed; call if any fever, chills, good handwashing is essential; pt instructed to continue to hold her Leukeran and will re-check lab again on Monday 01/23/14.

## 2014-01-16 NOTE — Patient Instructions (Signed)

## 2014-01-17 ENCOUNTER — Other Ambulatory Visit: Payer: Self-pay | Admitting: Internal Medicine

## 2014-01-19 ENCOUNTER — Ambulatory Visit (INDEPENDENT_AMBULATORY_CARE_PROVIDER_SITE_OTHER): Payer: Medicare Other | Admitting: General Surgery

## 2014-01-19 ENCOUNTER — Encounter (INDEPENDENT_AMBULATORY_CARE_PROVIDER_SITE_OTHER): Payer: Self-pay | Admitting: General Surgery

## 2014-01-19 VITALS — BP 146/80 | HR 80 | Temp 97.2°F | Resp 14 | Ht 63.0 in | Wt 255.4 lb

## 2014-01-19 DIAGNOSIS — C50919 Malignant neoplasm of unspecified site of unspecified female breast: Secondary | ICD-10-CM

## 2014-01-19 NOTE — Progress Notes (Signed)
Chief complaint: Followup breast cancer  History: Patient returns for followup history of T2 N0 cancer of the left breast status post left total mastectomy and sentinel lymph node biopsy now on adjuvant tamoxifen. She is also followed for chronic lymphoid leukemia. Surgery date was January 2012. She recently completed a course of treatment for her leukemia and has responded well but she had chest pain and had to be admitted. This is stable currently. She denies any changes in her breast or chest wall or arm swelling..  Exam:BP 146/80  Pulse 80  Temp(Src) 97.2 F (36.2 C)  Resp 14  Ht 5\' 3"  (1.6 m)  Wt 255 lb 6.4 oz (115.849 kg)  BMI 45.25 kg/m2 General: Obese but otherwise well-appearing African American female HEENT: No palpable masses or thyromegaly Lymph nodes: No cervical, supraventricular or axillary nodes palpable Breasts: Right breast is negative to exam without mass or skin change or discharge left chest wall is negative for mass or skin changes  Mammogram last week of the right breast was negative  Assessment and plan: Doing well following treatment for T2 N0 ER-positive cancer of left breast as above with no evidence of recurrence or complications. Return in one year

## 2014-01-21 ENCOUNTER — Other Ambulatory Visit: Payer: Self-pay | Admitting: Oncology

## 2014-01-23 ENCOUNTER — Other Ambulatory Visit (HOSPITAL_BASED_OUTPATIENT_CLINIC_OR_DEPARTMENT_OTHER): Payer: Medicare Other

## 2014-01-23 ENCOUNTER — Telehealth: Payer: Self-pay | Admitting: *Deleted

## 2014-01-23 DIAGNOSIS — C9112 Chronic lymphocytic leukemia of B-cell type in relapse: Secondary | ICD-10-CM

## 2014-01-23 DIAGNOSIS — C9192 Lymphoid leukemia, unspecified, in relapse: Secondary | ICD-10-CM

## 2014-01-23 LAB — CBC WITH DIFFERENTIAL/PLATELET
BASO%: 2.7 % — AB (ref 0.0–2.0)
Basophils Absolute: 0.1 10*3/uL (ref 0.0–0.1)
EOS%: 6.2 % (ref 0.0–7.0)
Eosinophils Absolute: 0.3 10*3/uL (ref 0.0–0.5)
HCT: 31.7 % — ABNORMAL LOW (ref 34.8–46.6)
HGB: 11.1 g/dL — ABNORMAL LOW (ref 11.6–15.9)
LYMPH#: 2 10*3/uL (ref 0.9–3.3)
LYMPH%: 48.5 % (ref 14.0–49.7)
MCH: 39.8 pg — ABNORMAL HIGH (ref 25.1–34.0)
MCHC: 35 g/dL (ref 31.5–36.0)
MCV: 113.6 fL — ABNORMAL HIGH (ref 79.5–101.0)
MONO#: 0.8 10*3/uL (ref 0.1–0.9)
MONO%: 19 % — ABNORMAL HIGH (ref 0.0–14.0)
NEUT#: 1 10*3/uL — ABNORMAL LOW (ref 1.5–6.5)
NEUT%: 23.6 % — AB (ref 38.4–76.8)
Platelets: 69 10*3/uL — ABNORMAL LOW (ref 145–400)
RBC: 2.79 10*6/uL — ABNORMAL LOW (ref 3.70–5.45)
RDW: 14.1 % (ref 11.2–14.5)
WBC: 4.1 10*3/uL (ref 3.9–10.3)
nRBC: 0 % (ref 0–0)

## 2014-01-23 NOTE — Telephone Encounter (Signed)
Call from pt's daughter asking if anything needs to be done about low PLT. CBC reviewed by Dr. Benay Spice: Continue to hold Chlorambucil. Recheck lab in one week, as scheduled. Returned call to daughter with this info. Neutropenic/Thrombocytopenic precautions reviewed. She voiced understanding.

## 2014-01-27 ENCOUNTER — Other Ambulatory Visit: Payer: Self-pay | Admitting: Internal Medicine

## 2014-01-30 ENCOUNTER — Other Ambulatory Visit (HOSPITAL_BASED_OUTPATIENT_CLINIC_OR_DEPARTMENT_OTHER): Payer: Medicare Other

## 2014-01-30 ENCOUNTER — Telehealth: Payer: Self-pay | Admitting: *Deleted

## 2014-01-30 DIAGNOSIS — C8589 Other specified types of non-Hodgkin lymphoma, extranodal and solid organ sites: Secondary | ICD-10-CM

## 2014-01-30 DIAGNOSIS — C50919 Malignant neoplasm of unspecified site of unspecified female breast: Secondary | ICD-10-CM

## 2014-01-30 DIAGNOSIS — C9112 Chronic lymphocytic leukemia of B-cell type in relapse: Secondary | ICD-10-CM

## 2014-01-30 DIAGNOSIS — C9192 Lymphoid leukemia, unspecified, in relapse: Secondary | ICD-10-CM

## 2014-01-30 LAB — CBC WITH DIFFERENTIAL/PLATELET
BASO%: 2.6 % — ABNORMAL HIGH (ref 0.0–2.0)
Basophils Absolute: 0.1 10*3/uL (ref 0.0–0.1)
EOS ABS: 0.2 10*3/uL (ref 0.0–0.5)
EOS%: 4.5 % (ref 0.0–7.0)
HCT: 29.5 % — ABNORMAL LOW (ref 34.8–46.6)
HGB: 10.6 g/dL — ABNORMAL LOW (ref 11.6–15.9)
LYMPH#: 1.8 10*3/uL (ref 0.9–3.3)
LYMPH%: 48 % (ref 14.0–49.7)
MCH: 40 pg — ABNORMAL HIGH (ref 25.1–34.0)
MCHC: 35.9 g/dL (ref 31.5–36.0)
MCV: 111.3 fL — AB (ref 79.5–101.0)
MONO#: 0.7 10*3/uL (ref 0.1–0.9)
MONO%: 19.3 % — AB (ref 0.0–14.0)
NEUT%: 25.6 % — ABNORMAL LOW (ref 38.4–76.8)
NEUTROS ABS: 1 10*3/uL — AB (ref 1.5–6.5)
NRBC: 0 % (ref 0–0)
Platelets: 73 10*3/uL — ABNORMAL LOW (ref 145–400)
RBC: 2.65 10*6/uL — AB (ref 3.70–5.45)
RDW: 14 % (ref 11.2–14.5)
WBC: 3.8 10*3/uL — AB (ref 3.9–10.3)

## 2014-01-30 LAB — TECHNOLOGIST REVIEW

## 2014-01-30 NOTE — Telephone Encounter (Signed)
Called patient with lab results and informed to continue to hold Chlorambucil. Per Elby Showers. Thomas.  Patient verbalized understanding.

## 2014-01-30 NOTE — Telephone Encounter (Signed)
Message copied by Norma Fredrickson on Mon Jan 30, 2014  4:16 PM ------      Message from: [place], Crawford K      Created: Mon Jan 30, 2014  4:08 PM       Please let her know counts are stable. Continue to hold chlorambucil. Followup as scheduled next week.      ----- Message -----         From: [facility]         Sent: 01/30/2014  11:45 AM           To: Owens Shark, NP                   ------

## 2014-02-06 ENCOUNTER — Other Ambulatory Visit (HOSPITAL_BASED_OUTPATIENT_CLINIC_OR_DEPARTMENT_OTHER): Payer: Medicare Other

## 2014-02-06 ENCOUNTER — Ambulatory Visit (HOSPITAL_BASED_OUTPATIENT_CLINIC_OR_DEPARTMENT_OTHER): Payer: Medicare Other | Admitting: Nurse Practitioner

## 2014-02-06 ENCOUNTER — Telehealth: Payer: Self-pay | Admitting: Oncology

## 2014-02-06 VITALS — BP 180/98 | HR 66 | Temp 98.6°F | Resp 20 | Ht 63.0 in | Wt 256.2 lb

## 2014-02-06 DIAGNOSIS — C50919 Malignant neoplasm of unspecified site of unspecified female breast: Secondary | ICD-10-CM

## 2014-02-06 DIAGNOSIS — C9192 Lymphoid leukemia, unspecified, in relapse: Secondary | ICD-10-CM

## 2014-02-06 DIAGNOSIS — I10 Essential (primary) hypertension: Secondary | ICD-10-CM

## 2014-02-06 DIAGNOSIS — D6959 Other secondary thrombocytopenia: Secondary | ICD-10-CM

## 2014-02-06 DIAGNOSIS — C9112 Chronic lymphocytic leukemia of B-cell type in relapse: Secondary | ICD-10-CM

## 2014-02-06 DIAGNOSIS — R5383 Other fatigue: Secondary | ICD-10-CM

## 2014-02-06 DIAGNOSIS — R5381 Other malaise: Secondary | ICD-10-CM

## 2014-02-06 DIAGNOSIS — C9110 Chronic lymphocytic leukemia of B-cell type not having achieved remission: Secondary | ICD-10-CM

## 2014-02-06 DIAGNOSIS — D702 Other drug-induced agranulocytosis: Secondary | ICD-10-CM

## 2014-02-06 DIAGNOSIS — R630 Anorexia: Secondary | ICD-10-CM

## 2014-02-06 DIAGNOSIS — C8590 Non-Hodgkin lymphoma, unspecified, unspecified site: Secondary | ICD-10-CM

## 2014-02-06 LAB — CBC WITH DIFFERENTIAL/PLATELET
BASO%: 3.4 % — AB (ref 0.0–2.0)
Basophils Absolute: 0.1 10*3/uL (ref 0.0–0.1)
EOS%: 5.1 % (ref 0.0–7.0)
Eosinophils Absolute: 0.2 10*3/uL (ref 0.0–0.5)
HCT: 28.8 % — ABNORMAL LOW (ref 34.8–46.6)
HGB: 10.2 g/dL — ABNORMAL LOW (ref 11.6–15.9)
LYMPH#: 1.6 10*3/uL (ref 0.9–3.3)
LYMPH%: 54.1 % — ABNORMAL HIGH (ref 14.0–49.7)
MCH: 39.2 pg — AB (ref 25.1–34.0)
MCHC: 35.4 g/dL (ref 31.5–36.0)
MCV: 110.8 fL — ABNORMAL HIGH (ref 79.5–101.0)
MONO#: 0.7 10*3/uL (ref 0.1–0.9)
MONO%: 23 % — ABNORMAL HIGH (ref 0.0–14.0)
NEUT#: 0.4 10*3/uL — CL (ref 1.5–6.5)
NEUT%: 14.4 % — ABNORMAL LOW (ref 38.4–76.8)
Platelets: 67 10*3/uL — ABNORMAL LOW (ref 145–400)
RBC: 2.6 10*6/uL — ABNORMAL LOW (ref 3.70–5.45)
RDW: 14.1 % (ref 11.2–14.5)
WBC: 3 10*3/uL — AB (ref 3.9–10.3)
nRBC: 0 % (ref 0–0)

## 2014-02-06 NOTE — Progress Notes (Signed)
  [facility] OFFICE PROGRESS NOTE   Diagnosis:  WDLL/CLL and breast cancer.   INTERVAL HISTORY:   [name] returns as scheduled. Chlorambucil was discontinued following her last visit on 01/09/2014 due to progressive neutropenia/thrombocytopenia.  She continues to have intermittent sweats. No fever. No cough. Stable dyspnea on exertion. No hematuria. She denies bleeding. She complains of periodic cramps in the hands, legs and feet.  Objective:  Vital signs in last 24 hours:  Blood pressure 180/98, pulse 66, temperature 98.6 F (37 C), temperature source Oral, resp. rate 20, height 5\' 3"  (1.6 m), weight 256 lb 3.2 oz (116.212 kg), SpO2 100.00%. Repeat blood pressure 160/90.   HEENT: No thrush or ulcerations. Lymphatics: No palpable cervical, supraclavicular, right axillary or inguinal lymph nodes. Question small left axillary lymph node. Resp: Lungs clear. Cardio: Regular cardiac rhythm. GI: Abdomen soft and nontender. No organomegaly. Vascular: Trace pretibial edema bilaterally.   Lab Results:  Lab Results  Component Value Date   WBC 3.0* 02/06/2014   HGB 10.2* 02/06/2014   HCT 28.8* 02/06/2014   MCV 110.8* 02/06/2014   PLT 67* 02/06/2014   NEUTROABS 0.4* 02/06/2014    Imaging:  No results found.  Medications: I have reviewed the patient's current medications.  Assessment/Plan: 1. Relapsed CLL/well-differentiated lymphocytic lymphoma. She began a trial of Ibrutinib 05/27/2013. It was stopped on 06/10/2013 due to profound fatigue, nausea and anorexia. In addition, labs on 06/10/2013 showed significant deterioration in blood counts with the platelet count down to 3000, felt to be secondary to disease progression as this was how she initially presented. Treatment was initiated with Obinutuzumab and Leukeran on 06/23/2013. Blood counts improved. Obinutuzumab discontinued following cycle 3 due to development of chest tightness requiring hospitalization. She ruled  out for an MI. Low dose daily chlorambucil continued until 01/09/2014 when it was placed on hold due to progressive neutropenia/thrombocytopenia. 2. Extreme fatigue, anorexia while on Ibrutinib. 3. Multifocal invasive and noninvasive cancers left breast status post mastectomy 10/09/2010 currently on tamoxifen. 4. Hypertension. 5. Recurrent atypical neurologic symptoms with dizziness, vertigo, paresthesias. 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 Mild changes in small vessels. No acute changes. Aspirin placed on hold due to severe thrombocytopenia. 6. Neutropenia/thrombocytopenia.   Disposition: There was initial improvement in the white count when chlorambucil was placed on hold one month ago. Today's labs are essentially unchanged as compared to 1 month ago. She will continue to hold the chlorambucil. Dr. Benay Spice recommends a repeat CBC in 3 weeks and a followup visit in 6 weeks. She understands to contact the office in the interim with fever, chills, other signs of infection, bleeding. We will schedule a Port-A-Cath flush when she returns for labs in 3 weeks.    [name] ANP/GNP-BC   02/06/2014  1:25 PM

## 2014-02-06 NOTE — Telephone Encounter (Signed)
Gave pt appt for lab and MD for june 2015

## 2014-02-09 ENCOUNTER — Ambulatory Visit: Payer: Medicare Other | Admitting: Internal Medicine

## 2014-02-16 ENCOUNTER — Other Ambulatory Visit: Payer: Self-pay | Admitting: Internal Medicine

## 2014-02-24 ENCOUNTER — Other Ambulatory Visit: Payer: Self-pay | Admitting: Oncology

## 2014-02-25 ENCOUNTER — Other Ambulatory Visit: Payer: Self-pay | Admitting: Nurse Practitioner

## 2014-02-27 ENCOUNTER — Ambulatory Visit (INDEPENDENT_AMBULATORY_CARE_PROVIDER_SITE_OTHER): Payer: Medicare Other | Admitting: Internal Medicine

## 2014-02-27 ENCOUNTER — Telehealth: Payer: Self-pay | Admitting: *Deleted

## 2014-02-27 ENCOUNTER — Other Ambulatory Visit (INDEPENDENT_AMBULATORY_CARE_PROVIDER_SITE_OTHER): Payer: Medicare Other

## 2014-02-27 ENCOUNTER — Encounter: Payer: Self-pay | Admitting: Internal Medicine

## 2014-02-27 ENCOUNTER — Ambulatory Visit (HOSPITAL_BASED_OUTPATIENT_CLINIC_OR_DEPARTMENT_OTHER): Payer: Medicare Other

## 2014-02-27 ENCOUNTER — Other Ambulatory Visit (HOSPITAL_BASED_OUTPATIENT_CLINIC_OR_DEPARTMENT_OTHER): Payer: Medicare Other

## 2014-02-27 VITALS — BP 203/69 | HR 101 | Temp 98.5°F

## 2014-02-27 VITALS — BP 142/70 | HR 75 | Temp 98.2°F | Wt 256.4 lb

## 2014-02-27 DIAGNOSIS — E785 Hyperlipidemia, unspecified: Secondary | ICD-10-CM

## 2014-02-27 DIAGNOSIS — C50919 Malignant neoplasm of unspecified site of unspecified female breast: Secondary | ICD-10-CM

## 2014-02-27 DIAGNOSIS — I10 Essential (primary) hypertension: Secondary | ICD-10-CM

## 2014-02-27 DIAGNOSIS — R209 Unspecified disturbances of skin sensation: Secondary | ICD-10-CM

## 2014-02-27 DIAGNOSIS — C8589 Other specified types of non-Hodgkin lymphoma, extranodal and solid organ sites: Secondary | ICD-10-CM

## 2014-02-27 DIAGNOSIS — Z452 Encounter for adjustment and management of vascular access device: Secondary | ICD-10-CM

## 2014-02-27 DIAGNOSIS — R200 Anesthesia of skin: Secondary | ICD-10-CM

## 2014-02-27 DIAGNOSIS — C9110 Chronic lymphocytic leukemia of B-cell type not having achieved remission: Secondary | ICD-10-CM

## 2014-02-27 DIAGNOSIS — Z95828 Presence of other vascular implants and grafts: Secondary | ICD-10-CM

## 2014-02-27 DIAGNOSIS — C9112 Chronic lymphocytic leukemia of B-cell type in relapse: Secondary | ICD-10-CM

## 2014-02-27 DIAGNOSIS — R202 Paresthesia of skin: Secondary | ICD-10-CM

## 2014-02-27 LAB — BASIC METABOLIC PANEL
BUN: 21 mg/dL (ref 6–23)
CO2: 24 mEq/L (ref 19–32)
Calcium: 9.7 mg/dL (ref 8.4–10.5)
Chloride: 108 mEq/L (ref 96–112)
Creatinine, Ser: 1.2 mg/dL (ref 0.4–1.2)
GFR: 58.69 mL/min — ABNORMAL LOW (ref >60.00)
GLUCOSE: 125 mg/dL — AB (ref 70–99)
Potassium: 4.2 mEq/L (ref 3.5–5.1)
Sodium: 140 mEq/L (ref 135–145)

## 2014-02-27 LAB — CBC WITH DIFFERENTIAL/PLATELET
BASO%: 3 % — AB (ref 0.0–2.0)
Basophils Absolute: 0.1 10*3/uL (ref 0.0–0.1)
EOS ABS: 0.1 10*3/uL (ref 0.0–0.5)
EOS%: 2.7 % (ref 0.0–7.0)
HCT: 27.9 % — ABNORMAL LOW (ref 34.8–46.6)
HGB: 10.1 g/dL — ABNORMAL LOW (ref 11.6–15.9)
LYMPH%: 56 % — AB (ref 14.0–49.7)
MCH: 40.1 pg — ABNORMAL HIGH (ref 25.1–34.0)
MCHC: 36.2 g/dL — AB (ref 31.5–36.0)
MCV: 110.7 fL — ABNORMAL HIGH (ref 79.5–101.0)
MONO#: 0.8 10*3/uL (ref 0.1–0.9)
MONO%: 23.5 % — AB (ref 0.0–14.0)
NEUT%: 14.8 % — ABNORMAL LOW (ref 38.4–76.8)
NEUTROS ABS: 0.5 10*3/uL — AB (ref 1.5–6.5)
PLATELETS: 66 10*3/uL — AB (ref 145–400)
RBC: 2.52 10*6/uL — AB (ref 3.70–5.45)
RDW: 14 % (ref 11.2–14.5)
WBC: 3.3 10*3/uL — AB (ref 3.9–10.3)
lymph#: 1.9 10*3/uL (ref 0.9–3.3)

## 2014-02-27 LAB — CK: Total CK: 35 U/L (ref 7–177)

## 2014-02-27 LAB — HEMOGLOBIN A1C: HEMOGLOBIN A1C: 5.3 % (ref 4.6–6.5)

## 2014-02-27 LAB — LIPID PANEL
Cholesterol: 221 mg/dL — ABNORMAL HIGH (ref 0–200)
HDL: 65.2 mg/dL (ref >39.00)
LDL Cholesterol: 120 mg/dL — ABNORMAL HIGH (ref 0–99)
Total CHOL/HDL Ratio: 3
Triglycerides: 178 mg/dL — ABNORMAL HIGH (ref 0.0–149.0)
VLDL: 35.6 mg/dL (ref 0.0–40.0)

## 2014-02-27 LAB — VITAMIN B12: Vitamin B-12: 361 pg/mL (ref 211–911)

## 2014-02-27 LAB — TSH: TSH: 1.95 u[IU]/mL (ref 0.35–4.50)

## 2014-02-27 MED ORDER — HEPARIN SOD (PORK) LOCK FLUSH 100 UNIT/ML IV SOLN
500.0000 [IU] | Freq: Once | INTRAVENOUS | Status: AC
  Administered 2014-02-27: 500 [IU] via INTRAVENOUS
  Filled 2014-02-27: qty 5

## 2014-02-27 MED ORDER — SODIUM CHLORIDE 0.9 % IJ SOLN
10.0000 mL | INTRAMUSCULAR | Status: DC | PRN
  Administered 2014-02-27: 10 mL via INTRAVENOUS
  Filled 2014-02-27: qty 10

## 2014-02-27 NOTE — Patient Instructions (Signed)
It was good to see you today.  We have reviewed your prior records including labs and tests today  Test(s) ordered today. Your results will be released to [facility] (or called to you) after review, usually within 72hours after test completion. If any changes need to be made, you will be notified at that same time.  Medications reviewed and updated, no changes recommended at this time. Will consider holding Lipitor because of ?side effects, but let's review labs for other possible problems first  Refill on medication(s) as discussed today.  Please schedule followup in 6-12 months, call sooner if problems.

## 2014-02-27 NOTE — Assessment & Plan Note (Signed)
Despite hx CVA 05/2009 and TIA 01/2012, denies ever rx'd statin begin atorva 10g qd 01/2013 -reports tolerating well, but ?related to mylagia Recheck lipids now, consider "drug holiday" because of ?side effects as needed

## 2014-02-27 NOTE — Telephone Encounter (Signed)
Notified of CBC results-stable per Dr. Benay Spice. Follow up as scheduled on 6/22

## 2014-02-27 NOTE — Assessment & Plan Note (Signed)
BP Readings from Last 3 Encounters:  02/27/14 142/70  02/06/14 180/98  01/19/14 146/80   amlodipine added October 2014 hospitalization and generally improved Reminded of importance of refills and compliance as rx'd

## 2014-02-27 NOTE — Progress Notes (Signed)
Pre visit review using our clinic review tool, if applicable. No additional management support is needed unless otherwise documented below in the visit note. 

## 2014-02-27 NOTE — Assessment & Plan Note (Signed)
No motor deficit on exam Check screening labs Chronic symptoms reviewed - MRI 04/2013 with remote thalamic CVA - on med tx for same

## 2014-02-27 NOTE — Patient Instructions (Signed)

## 2014-02-27 NOTE — Progress Notes (Signed)
Subjective:    Patient ID: Inez Catalina, female    DOB: [birth year], 80 y.o.   MRN: [id number]  HPI  Patient is here for follow up  Reviewed chronic medical issues and interval medical events  Past Medical History  Diagnosis Date  . 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 Stroke 05/2009    05/2009  Right upper extrem/leg weakness; nuchal pain;  . Benign essential HTN   . Macrocytic anemia   . Vertigo     ?TIA - MRI brain 5/13  No new CVA  . Pleurisy     01/16/12 pleuritic right chest pain  V/Q scan [facility] low probability PE  . Breast cancer left T2N0 S/P mastectomy and SLN Bx 09/2010 2009  . CLL (chronic lymphoid leukemia) in relapse   . Arthritis   . Hyperlipidemia   . Thrombocytopenia, unspecified 06/15/2013    Review of Systems  Constitutional: Positive for fatigue. Negative for fever and unexpected weight change.  Respiratory: Negative for cough and shortness of breath.   Cardiovascular: Negative for chest pain and leg swelling.  Gastrointestinal: Negative for nausea, vomiting, diarrhea and constipation.  Endocrine:       Feels "strong" libido, ?is this normal  Musculoskeletal: Positive for arthralgias (R knee - follows at [facility] for same), myalgias (cramping - hands and toes, occ) and neck pain (spasm on R side, improves with tylenol prn). Negative for gait problem.  Neurological: Positive for numbness (R hand>L and hands>feet, chronic). Negative for seizures and speech difficulty.       Objective:   Physical Exam  BP 142/70  Pulse 75  Temp(Src) 98.2 F (36.8 C) (Oral)  Wt 256 lb 6.4 oz (116.302 kg)  SpO2 99% Wt Readings from Last 3 Encounters:  02/27/14 256 lb 6.4 oz (116.302 kg)  02/06/14 256 lb 3.2 oz (116.212 kg)  01/19/14 255 lb 6.4 oz (115.849 kg)   Constitutional: She is obese, but appears well-developed and well-nourished. No  distress. daughter at side Neck: Normal range of motion. Neck supple. No JVD present. No thyromegaly present.  Cardiovascular: Normal rate, regular rhythm and normal heart sounds.  No murmur heard. No BLE edema. Pulmonary/Chest: Effort normal and breath sounds normal. No respiratory distress. She has no wheezes.  Neurologic: awake, alert, oriented x3. Cranial nerves II through XII symmetrically intact. Equal good hand grip with normal fine motor. Gait assisted with rolling walker for balance, slow but otherwise intact. Motor strength 5 out of 5 in lower extremities symmetric Psychiatric: She has a normal mood and affect. Her behavior is normal. Judgment and thought content normal.   Lab Results  Component Value Date   WBC 3.0* 02/06/2014   HGB 10.2* 02/06/2014   HCT 28.8* 02/06/2014   PLT 67* 02/06/2014   GLUCOSE 129 11/30/2013   CHOL 287* 02/01/2013   TRIG 168.0* 02/01/2013   HDL 75.30 02/01/2013   LDLDIRECT 179.3 02/01/2013   LDLCALC 78 01/01/2011   ALT 22 11/30/2013   AST 23 11/30/2013   NA 143 11/30/2013   K 4.5 11/30/2013   CL 102 07/07/2013   CREATININE 1.1 11/30/2013   BUN 15.3 11/30/2013   CO2 24 11/30/2013   TSH 1.74 02/01/2013   INR 0.98 02/19/2012   HGBA1C  Value: 5.5 (NOTE)  According to the ADA Clinical Practice Recommendations for 2011, when HbA1c is used as a screening test:   >=6.5%   Diagnostic of Diabetes Mellitus           (if abnormal result  is confirmed)  5.7-6.4%   Increased risk of developing Diabetes Mellitus  References:Diagnosis and Classification of Diabetes Mellitus,Diabetes BTDV,7616,07(PXTGG 1):S62-S69 and Standards of Medical Care in         Diabetes - 2011,Diabetes YIRS,8546,27  (Suppl 1):S11-S61. 06/13/2010    Mm Screening Breast Tomo Uni R  01/11/2014   CLINICAL DATA:  Screening.  EXAM: DIGITAL SCREENING UNILATERAL RIGHT MAMMOGRAM WITH TOMO AND CAD  COMPARISON:  Previous exam(s).  ACR Breast Density Category b:  There are scattered areas of fibroglandular density.  FINDINGS: There are no findings suspicious for malignancy. Images were processed with CAD.  IMPRESSION: No mammographic evidence of malignancy. A result letter of this screening mammogram will be mailed directly to the patient.  RECOMMENDATION: Screening mammogram in one year. (Code:SM-B-01Y)  BI-RADS CATEGORY  1: Negative.   Electronically Signed   By: Lillia Mountain M.D.   On: 01/11/2014 14:33       Assessment & Plan:   Problem List Items Addressed This Visit   Benign essential HTN      BP Readings from Last 3 Encounters:  02/27/14 142/70  02/06/14 180/98  01/19/14 146/80   amlodipine added October 2014 hospitalization and generally improved Reminded of importance of refills and compliance as rx'd    Relevant Orders      Basic metabolic panel      CK   Hyperlipidemia - Primary     Despite hx CVA 05/2009 and TIA 01/2012, denies ever rx'd statin begin atorva 10g qd 01/2013 -reports tolerating well, but ?related to mylagia Recheck lipids now, consider "drug holiday" because of ?side effects as needed    Relevant Orders      Lipid panel      CK   Numbness and tingling     No motor deficit on exam Check screening labs Chronic symptoms reviewed - MRI 04/2013 with remote thalamic CVA - on med tx for same    Relevant Orders      TSH      Vitamin B12      Hemoglobin A1c      CK

## 2014-03-09 ENCOUNTER — Other Ambulatory Visit: Payer: Self-pay | Admitting: Nurse Practitioner

## 2014-03-09 DIAGNOSIS — C9112 Chronic lymphocytic leukemia of B-cell type in relapse: Secondary | ICD-10-CM

## 2014-03-09 DIAGNOSIS — C9192 Lymphoid leukemia, unspecified, in relapse: Secondary | ICD-10-CM

## 2014-03-11 ENCOUNTER — Other Ambulatory Visit: Payer: Self-pay | Admitting: Oncology

## 2014-03-15 ENCOUNTER — Other Ambulatory Visit: Payer: Self-pay | Admitting: Oncology

## 2014-03-20 ENCOUNTER — Other Ambulatory Visit: Payer: Self-pay | Admitting: *Deleted

## 2014-03-20 ENCOUNTER — Ambulatory Visit (HOSPITAL_BASED_OUTPATIENT_CLINIC_OR_DEPARTMENT_OTHER): Payer: Medicare Other | Admitting: Oncology

## 2014-03-20 ENCOUNTER — Telehealth: Payer: Self-pay | Admitting: Oncology

## 2014-03-20 ENCOUNTER — Other Ambulatory Visit (HOSPITAL_BASED_OUTPATIENT_CLINIC_OR_DEPARTMENT_OTHER): Payer: Medicare Other

## 2014-03-20 VITALS — BP 182/74 | HR 63 | Temp 97.0°F | Resp 18 | Ht 63.0 in | Wt 258.3 lb

## 2014-03-20 DIAGNOSIS — C9110 Chronic lymphocytic leukemia of B-cell type not having achieved remission: Secondary | ICD-10-CM

## 2014-03-20 DIAGNOSIS — C9112 Chronic lymphocytic leukemia of B-cell type in relapse: Secondary | ICD-10-CM

## 2014-03-20 DIAGNOSIS — D709 Neutropenia, unspecified: Secondary | ICD-10-CM

## 2014-03-20 DIAGNOSIS — I10 Essential (primary) hypertension: Secondary | ICD-10-CM

## 2014-03-20 DIAGNOSIS — D696 Thrombocytopenia, unspecified: Secondary | ICD-10-CM

## 2014-03-20 DIAGNOSIS — R5383 Other fatigue: Secondary | ICD-10-CM

## 2014-03-20 DIAGNOSIS — C9192 Lymphoid leukemia, unspecified, in relapse: Secondary | ICD-10-CM

## 2014-03-20 DIAGNOSIS — R5381 Other malaise: Secondary | ICD-10-CM

## 2014-03-20 LAB — CBC WITH DIFFERENTIAL/PLATELET
BASO%: 5 % — ABNORMAL HIGH (ref 0.0–2.0)
Basophils Absolute: 0.2 10*3/uL — ABNORMAL HIGH (ref 0.0–0.1)
EOS ABS: 0.1 10*3/uL (ref 0.0–0.5)
EOS%: 2.7 % (ref 0.0–7.0)
HEMATOCRIT: 28.7 % — AB (ref 34.8–46.6)
HGB: 10.2 g/dL — ABNORMAL LOW (ref 11.6–15.9)
LYMPH%: 57.7 % — AB (ref 14.0–49.7)
MCH: 39.4 pg — ABNORMAL HIGH (ref 25.1–34.0)
MCHC: 35.5 g/dL (ref 31.5–36.0)
MCV: 110.8 fL — ABNORMAL HIGH (ref 79.5–101.0)
MONO#: 1.3 10*3/uL — AB (ref 0.1–0.9)
MONO%: 26.2 % — ABNORMAL HIGH (ref 0.0–14.0)
NEUT%: 8.4 % — AB (ref 38.4–76.8)
NEUTROS ABS: 0.4 10*3/uL — AB (ref 1.5–6.5)
PLATELETS: 77 10*3/uL — AB (ref 145–400)
RBC: 2.59 10*6/uL — ABNORMAL LOW (ref 3.70–5.45)
RDW: 14.5 % (ref 11.2–14.5)
WBC: 4.8 10*3/uL (ref 3.9–10.3)
lymph#: 2.8 10*3/uL (ref 0.9–3.3)
nRBC: 0 % (ref 0–0)

## 2014-03-20 MED ORDER — MECLIZINE HCL 12.5 MG PO TABS: 12.5000 mg | ORAL_TABLET | Freq: Three times a day (TID) | ORAL | Status: DC | PRN

## 2014-03-20 MED ORDER — PANTOPRAZOLE SODIUM 40 MG PO TBEC: 40.0000 mg | DELAYED_RELEASE_TABLET | Freq: Every day | ORAL | Status: DC

## 2014-03-20 NOTE — Progress Notes (Signed)
  [place] OFFICE PROGRESS NOTE   Diagnosis: CLL, pancytopenia  INTERVAL HISTORY:   [name] returns as scheduled. No fever, bleeding, or recent infection. She complains of "cramping" in the right leg intermittently. This has been a chronic problem, at least 6 months. No leg swelling.  Objective:  Vital signs in last 24 hours:  Blood pressure 182/74, pulse 63, temperature 97 F (36.1 C), temperature source Oral, resp. rate 18, height 5\' 3"  (1.6 m), weight 258 lb 4.8 oz (117.164 kg).    HEENT: No thrush Lymphatics: No cervical, supraclavicular, or axillary nodes Resp: Lungs clear bilaterally Cardio: Regular rate and rhythm GI: No hepatosplenomegaly Vascular: No leg edema Breasts: Status post left mastectomy. No evidence for local tumor recurrence    Portacath/PICC-without erythema  Lab Results:  Lab Results  Component Value Date   WBC 4.8 03/20/2014   HGB 10.2* 03/20/2014   HCT 28.7* 03/20/2014   MCV 110.8* 03/20/2014   PLT 77* 03/20/2014   NEUTROABS 0.4* 03/20/2014     Medications: I have reviewed the patient's current medications.  Assessment/Plan: 1. Relapsed CLL/well-differentiated lymphocytic lymphoma. She began a trial of Ibrutinib 05/27/2013. It was stopped on 06/10/2013 due to profound fatigue, nausea and anorexia. In addition, labs on 06/10/2013 showed significant deterioration in blood counts with the platelet count down to 3000, felt to be secondary to disease progression as this was how she initially presented. Treatment was initiated with Obinutuzumab and Leukeran on 06/23/2013. Blood counts improved. Obinutuzumab discontinued following cycle 3 due to development of chest tightness requiring hospitalization. She ruled out for an MI. Low dose daily chlorambucil continued until 01/09/2014 when it was placed on hold due to progressive neutropenia/thrombocytopenia. 2. Extreme fatigue, anorexia while on Ibrutinib. 3. Multifocal invasive and noninvasive  cancers left breast status post mastectomy 10/09/2010 (mpT2,mpN0) currently on tamoxifen. 4. Hypertension. 5. Recurrent atypical neurologic symptoms with dizziness, vertigo, paresthesias. 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 Mild changes in small vessels. No acute changes. Aspirin placed on hold due to severe thrombocytopenia. 6. Neutropenia/thrombocytopenia-stable  Disposition:  Mr. Phillis appears stable. She remains off of specific therapy for CLL. The persistent pancytopenia is likely secondary to CLL versus treatment given over the past year. She will discontinue Bactrim prophylaxis since she has been off of anti-CD20 therapy for greater than 6 months. She knows to contact us for a fever. She continues acyclovir prophylaxis.  [name] will return for a lab visit and Port-A-Cath flush in 3 weeks. We will see her for an office visit in 6 weeks.  Betsy Coder, MD  03/20/2014  12:40 PM

## 2014-03-20 NOTE — Telephone Encounter (Signed)
Gave pt appt for lab,md and flush for july and August 2015

## 2014-04-02 ENCOUNTER — Other Ambulatory Visit: Payer: Self-pay | Admitting: Nurse Practitioner

## 2014-04-02 DIAGNOSIS — C9112 Chronic lymphocytic leukemia of B-cell type in relapse: Secondary | ICD-10-CM

## 2014-04-03 ENCOUNTER — Telehealth: Payer: Self-pay | Admitting: Oncology

## 2014-04-03 NOTE — Telephone Encounter (Signed)
Pt r/s from 7/13 to 7/14

## 2014-04-10 ENCOUNTER — Other Ambulatory Visit: Payer: Medicare Other

## 2014-04-11 ENCOUNTER — Other Ambulatory Visit: Payer: Self-pay | Admitting: Oncology

## 2014-04-11 ENCOUNTER — Ambulatory Visit (HOSPITAL_BASED_OUTPATIENT_CLINIC_OR_DEPARTMENT_OTHER): Payer: Medicare Other

## 2014-04-11 ENCOUNTER — Other Ambulatory Visit (HOSPITAL_BASED_OUTPATIENT_CLINIC_OR_DEPARTMENT_OTHER): Payer: Medicare Other

## 2014-04-11 VITALS — BP 175/55 | HR 61 | Temp 97.8°F

## 2014-04-11 DIAGNOSIS — C9112 Chronic lymphocytic leukemia of B-cell type in relapse: Secondary | ICD-10-CM

## 2014-04-11 DIAGNOSIS — Z452 Encounter for adjustment and management of vascular access device: Secondary | ICD-10-CM

## 2014-04-11 DIAGNOSIS — C9192 Lymphoid leukemia, unspecified, in relapse: Secondary | ICD-10-CM

## 2014-04-11 DIAGNOSIS — Z95828 Presence of other vascular implants and grafts: Secondary | ICD-10-CM

## 2014-04-11 LAB — CBC WITH DIFFERENTIAL/PLATELET
BASO%: 4.6 % — ABNORMAL HIGH (ref 0.0–2.0)
BASOS ABS: 0.2 10*3/uL — AB (ref 0.0–0.1)
EOS%: 2 % (ref 0.0–7.0)
Eosinophils Absolute: 0.1 10*3/uL (ref 0.0–0.5)
HEMATOCRIT: 28.7 % — AB (ref 34.8–46.6)
HEMOGLOBIN: 10.1 g/dL — AB (ref 11.6–15.9)
LYMPH%: 56.2 % — ABNORMAL HIGH (ref 14.0–49.7)
MCH: 39.6 pg — ABNORMAL HIGH (ref 25.1–34.0)
MCHC: 35.2 g/dL (ref 31.5–36.0)
MCV: 112.5 fL — AB (ref 79.5–101.0)
MONO#: 1.1 10*3/uL — ABNORMAL HIGH (ref 0.1–0.9)
MONO%: 28.9 % — AB (ref 0.0–14.0)
NEUT#: 0.3 10*3/uL — CL (ref 1.5–6.5)
NEUT%: 8.3 % — AB (ref 38.4–76.8)
Platelets: 71 10*3/uL — ABNORMAL LOW (ref 145–400)
RBC: 2.55 10*6/uL — ABNORMAL LOW (ref 3.70–5.45)
RDW: 14.4 % (ref 11.2–14.5)
WBC: 4 10*3/uL (ref 3.9–10.3)
lymph#: 2.2 10*3/uL (ref 0.9–3.3)

## 2014-04-11 LAB — TECHNOLOGIST REVIEW

## 2014-04-11 MED ORDER — HEPARIN SOD (PORK) LOCK FLUSH 100 UNIT/ML IV SOLN
500.0000 [IU] | Freq: Once | INTRAVENOUS | Status: AC
  Administered 2014-04-11: 500 [IU] via INTRAVENOUS
  Filled 2014-04-11: qty 5

## 2014-04-11 MED ORDER — SODIUM CHLORIDE 0.9 % IJ SOLN
10.0000 mL | INTRAMUSCULAR | Status: DC | PRN
Start: 2014-04-11 — End: 2014-04-11
  Administered 2014-04-11: 10 mL via INTRAVENOUS
  Filled 2014-04-11: qty 10

## 2014-04-12 ENCOUNTER — Telehealth: Payer: Self-pay | Admitting: *Deleted

## 2014-04-12 NOTE — Telephone Encounter (Signed)
7/14 CBC reviewed by Dr. Benay Spice: Call office for fever or shaking chills. Called pt with these instructions. She voiced understanding, stated she had severe pain in R hip/ lower abdomen area on 04/11/14. Took increased dose of Tylenol. Has had persistent lower abdominal pain for about 3 mos. Recommended she mention this pain to PCP. She voiced understanding.

## 2014-04-18 ENCOUNTER — Other Ambulatory Visit: Payer: Self-pay | Admitting: Oncology

## 2014-04-18 DIAGNOSIS — C50919 Malignant neoplasm of unspecified site of unspecified female breast: Secondary | ICD-10-CM

## 2014-04-26 ENCOUNTER — Other Ambulatory Visit: Payer: Self-pay | Admitting: Nurse Practitioner

## 2014-04-26 ENCOUNTER — Other Ambulatory Visit: Payer: Self-pay | Admitting: Oncology

## 2014-05-01 ENCOUNTER — Telehealth: Payer: Self-pay | Admitting: Oncology

## 2014-05-01 ENCOUNTER — Ambulatory Visit (HOSPITAL_BASED_OUTPATIENT_CLINIC_OR_DEPARTMENT_OTHER): Payer: Medicare Other | Admitting: Nurse Practitioner

## 2014-05-01 ENCOUNTER — Other Ambulatory Visit (HOSPITAL_BASED_OUTPATIENT_CLINIC_OR_DEPARTMENT_OTHER): Payer: Medicare Other

## 2014-05-01 VITALS — BP 178/92 | HR 78 | Temp 98.5°F | Resp 18 | Ht 63.0 in | Wt 260.2 lb

## 2014-05-01 DIAGNOSIS — C9112 Chronic lymphocytic leukemia of B-cell type in relapse: Secondary | ICD-10-CM

## 2014-05-01 DIAGNOSIS — D696 Thrombocytopenia, unspecified: Secondary | ICD-10-CM

## 2014-05-01 DIAGNOSIS — D709 Neutropenia, unspecified: Secondary | ICD-10-CM

## 2014-05-01 DIAGNOSIS — Z8673 Personal history of transient ischemic attack (TIA), and cerebral infarction without residual deficits: Secondary | ICD-10-CM

## 2014-05-01 DIAGNOSIS — C9110 Chronic lymphocytic leukemia of B-cell type not having achieved remission: Secondary | ICD-10-CM

## 2014-05-01 DIAGNOSIS — C9192 Lymphoid leukemia, unspecified, in relapse: Secondary | ICD-10-CM

## 2014-05-01 LAB — CBC WITH DIFFERENTIAL/PLATELET
BASO%: 4.5 % — ABNORMAL HIGH (ref 0.0–2.0)
Basophils Absolute: 0.3 10*3/uL — ABNORMAL HIGH (ref 0.0–0.1)
EOS%: 2.7 % (ref 0.0–7.0)
Eosinophils Absolute: 0.2 10*3/uL (ref 0.0–0.5)
HEMATOCRIT: 27.9 % — AB (ref 34.8–46.6)
HEMOGLOBIN: 9.8 g/dL — AB (ref 11.6–15.9)
LYMPH%: 47.5 % (ref 14.0–49.7)
MCH: 39.8 pg — AB (ref 25.1–34.0)
MCHC: 35.1 g/dL (ref 31.5–36.0)
MCV: 113.4 fL — AB (ref 79.5–101.0)
MONO#: 1.5 10*3/uL — ABNORMAL HIGH (ref 0.1–0.9)
MONO%: 27.3 % — AB (ref 0.0–14.0)
NEUT#: 1 10*3/uL — ABNORMAL LOW (ref 1.5–6.5)
NEUT%: 18 % — AB (ref 38.4–76.8)
Platelets: 69 10*3/uL — ABNORMAL LOW (ref 145–400)
RBC: 2.46 10*6/uL — ABNORMAL LOW (ref 3.70–5.45)
RDW: 14.6 % — ABNORMAL HIGH (ref 11.2–14.5)
WBC: 5.5 10*3/uL (ref 3.9–10.3)
lymph#: 2.6 10*3/uL (ref 0.9–3.3)
nRBC: 0 % (ref 0–0)

## 2014-05-01 LAB — TECHNOLOGIST REVIEW

## 2014-05-01 NOTE — Telephone Encounter (Signed)
gv pt appt schedule for aug thru nov.

## 2014-05-01 NOTE — Progress Notes (Addendum)
[place] OFFICE PROGRESS NOTE   Diagnosis:  CLL, pancytopenia.  INTERVAL HISTORY:   [name] returns as scheduled. She denies fever. No recent infections. No bleeding. She continues to have intermittent leg cramps. No shortness of breath. No nausea or vomiting. Bowels moving regularly. She denies dysuria. She notes frequent urination which has been a chronic problem. She attributes this to hydrochlorothiazide.  Main concern today is related to a "knot" at the left lower inner lip/gum that she noticed initially about 2 weeks ago. There has been no change in size. No associated pain. No redness.  Objective:  Vital signs in last 24 hours:  Blood pressure 178/92, pulse 78, temperature 98.5 F (36.9 C), temperature source Oral, resp. rate 18, height 5\' 3"  (1.6 m), weight 260 lb 3.2 oz (118.026 kg), SpO2 100.00%.    HEENT: Pea-sized nodule at the left lower inner lip/mucosa. No surrounding erythema. Nontender. Lymphatics: No palpable cervical, supraclavicular, axillary or inguinal lymph nodes. Resp: Lungs clear. Cardio: Regular cardiac rhythm. GI: Abdomen soft and nontender. No organomegaly. Vascular: No leg edema.   Port-A-Cath site without erythema.    Lab Results:  Lab Results  Component Value Date   WBC 5.5 05/01/2014   HGB 9.8* 05/01/2014   HCT 27.9* 05/01/2014   MCV 113.4* 05/01/2014   PLT 69* 05/01/2014   NEUTROABS 1.0* 05/01/2014    Imaging:  No results found.  Medications: I have reviewed the patient's current medications.  Assessment/Plan: 1. Relapsed CLL/well-differentiated lymphocytic lymphoma. She began a trial of Ibrutinib 05/27/2013. It was stopped on 06/10/2013 due to profound fatigue, nausea and anorexia. In addition, labs on 06/10/2013 showed significant deterioration in blood counts with the platelet count down to 3000, felt to be secondary to disease progression as this was how she initially presented. Treatment was initiated with Obinutuzumab and  Leukeran on 06/23/2013. Blood counts improved. Obinutuzumab discontinued following cycle 3 due to development of chest tightness requiring hospitalization. She ruled out for an MI. Low dose daily chlorambucil continued until 01/09/2014 when it was placed on hold due to progressive neutropenia/thrombocytopenia. 2. Extreme fatigue, anorexia while on Ibrutinib. 3. Multifocal invasive and noninvasive cancers left breast status post mastectomy 10/09/2010 (mpT2,mpN0) currently on tamoxifen. 4. Hypertension. 5. Recurrent atypical neurologic symptoms with dizziness, vertigo, paresthesias. 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 Mild changes in small vessels. No acute changes. Aspirin placed on hold due to severe thrombocytopenia. 6. Neutropenia/thrombocytopenia.    Disposition: [name] appears stable. She remains off of treatment for CLL. The neutrophil count is better and other counts are stable. We will continue to follow on an observation approach.  The lesion at the left lower inner lip is likely benign, question cyst. She will contact the office with any increase in the size of the lesion and we will refer her to dental medicine.  She will return for labs and a Port-A-Cath flush in 3 weeks with labs thereafter on a 6 week schedule. We will see her in followup in 12 weeks. She will contact the office in the interim with any problems.  Patient seen with Dr. Benay Spice.    Ned Card ANP/GNP-BC   05/01/2014  10:54 AM  This was a shared visit with Ned Card.  [name] was examined. The lower lip lesion is likely a benign cyst. She will contact us if this lesion changes and we will make a dental referral.  Her white count has improved and the hemoglobin/platelets  remain stable.  [name], M.D.

## 2014-05-13 ENCOUNTER — Other Ambulatory Visit: Payer: Self-pay | Admitting: Oncology

## 2014-05-18 ENCOUNTER — Other Ambulatory Visit: Payer: Self-pay | Admitting: Oncology

## 2014-05-18 DIAGNOSIS — C9110 Chronic lymphocytic leukemia of B-cell type not having achieved remission: Secondary | ICD-10-CM

## 2014-05-22 ENCOUNTER — Ambulatory Visit: Payer: Medicare Other

## 2014-05-22 ENCOUNTER — Other Ambulatory Visit (HOSPITAL_BASED_OUTPATIENT_CLINIC_OR_DEPARTMENT_OTHER): Payer: Medicare Other

## 2014-05-22 VITALS — BP 161/49 | HR 59 | Temp 98.3°F

## 2014-05-22 DIAGNOSIS — Z95828 Presence of other vascular implants and grafts: Secondary | ICD-10-CM

## 2014-05-22 DIAGNOSIS — C9112 Chronic lymphocytic leukemia of B-cell type in relapse: Secondary | ICD-10-CM

## 2014-05-22 DIAGNOSIS — C9110 Chronic lymphocytic leukemia of B-cell type not having achieved remission: Secondary | ICD-10-CM

## 2014-05-22 LAB — CBC WITH DIFFERENTIAL/PLATELET
BASO%: 1.3 % (ref 0.0–2.0)
Basophils Absolute: 0.1 10*3/uL (ref 0.0–0.1)
EOS%: 2.1 % (ref 0.0–7.0)
Eosinophils Absolute: 0.1 10*3/uL (ref 0.0–0.5)
HEMATOCRIT: 29.5 % — AB (ref 34.8–46.6)
HGB: 10 g/dL — ABNORMAL LOW (ref 11.6–15.9)
LYMPH%: 72.9 % — AB (ref 14.0–49.7)
MCH: 40.1 pg — AB (ref 25.1–34.0)
MCHC: 34 g/dL (ref 31.5–36.0)
MCV: 118 fL — AB (ref 79.5–101.0)
MONO#: 0.4 10*3/uL (ref 0.1–0.9)
MONO%: 7.3 % (ref 0.0–14.0)
NEUT#: 0.9 10*3/uL — ABNORMAL LOW (ref 1.5–6.5)
NEUT%: 16.4 % — AB (ref 38.4–76.8)
PLATELETS: 72 10*3/uL — AB (ref 145–400)
RBC: 2.5 10*6/uL — ABNORMAL LOW (ref 3.70–5.45)
RDW: 15.6 % — ABNORMAL HIGH (ref 11.2–14.5)
WBC: 5.2 10*3/uL (ref 3.9–10.3)
lymph#: 3.8 10*3/uL — ABNORMAL HIGH (ref 0.9–3.3)

## 2014-05-22 LAB — TECHNOLOGIST REVIEW

## 2014-05-22 MED ORDER — SODIUM CHLORIDE 0.9 % IJ SOLN
10.0000 mL | INTRAMUSCULAR | Status: DC | PRN
  Administered 2014-05-22: 10 mL via INTRAVENOUS
  Filled 2014-05-22: qty 10

## 2014-05-22 MED ORDER — HEPARIN SOD (PORK) LOCK FLUSH 100 UNIT/ML IV SOLN
500.0000 [IU] | Freq: Once | INTRAVENOUS | Status: AC
  Administered 2014-05-22: 500 [IU] via INTRAVENOUS
  Filled 2014-05-22: qty 5

## 2014-05-22 NOTE — Patient Instructions (Signed)

## 2014-05-24 ENCOUNTER — Telehealth: Payer: Self-pay

## 2014-05-24 NOTE — Telephone Encounter (Signed)
Message copied by Tyler Aas A on Wed May 24, 2014  9:35 AM ------      Message from: [place], [place] K      Created: Wed May 24, 2014  9:11 AM       Please let her know counts are stable. F/u as scheduled.  ------

## 2014-05-24 NOTE — Telephone Encounter (Signed)
Message copied by Tyler Aas A on Wed May 24, 2014  9:30 AM ------      Message from: [place], [place] K      Created: Wed May 24, 2014  9:11 AM       Please let her know counts are stable. F/u as scheduled.  ------

## 2014-05-24 NOTE — Telephone Encounter (Signed)
Informed pt counts were stable and to f/u as scheduled. Pt verbalized understanding and denies any questions or concerns at this time.

## 2014-06-01 ENCOUNTER — Other Ambulatory Visit: Payer: Self-pay | Admitting: Oncology

## 2014-06-01 DIAGNOSIS — C9110 Chronic lymphocytic leukemia of B-cell type not having achieved remission: Secondary | ICD-10-CM

## 2014-06-29 ENCOUNTER — Telehealth: Payer: Self-pay | Admitting: Oncology

## 2014-06-29 NOTE — Telephone Encounter (Signed)
returned pt call adn confirmed appts.

## 2014-07-03 ENCOUNTER — Other Ambulatory Visit: Payer: Medicare Other

## 2014-07-04 ENCOUNTER — Other Ambulatory Visit: Payer: Self-pay | Admitting: *Deleted

## 2014-07-04 ENCOUNTER — Other Ambulatory Visit (HOSPITAL_BASED_OUTPATIENT_CLINIC_OR_DEPARTMENT_OTHER): Payer: Medicare Other

## 2014-07-04 ENCOUNTER — Ambulatory Visit (HOSPITAL_BASED_OUTPATIENT_CLINIC_OR_DEPARTMENT_OTHER): Payer: Medicare Other

## 2014-07-04 ENCOUNTER — Ambulatory Visit (HOSPITAL_BASED_OUTPATIENT_CLINIC_OR_DEPARTMENT_OTHER): Payer: Medicare Other | Admitting: Nurse Practitioner

## 2014-07-04 VITALS — BP 156/62 | HR 63 | Temp 98.8°F | Resp 20

## 2014-07-04 VITALS — BP 198/64 | HR 62 | Temp 98.3°F | Resp 18 | Ht 63.0 in | Wt 253.6 lb

## 2014-07-04 DIAGNOSIS — Z95828 Presence of other vascular implants and grafts: Secondary | ICD-10-CM

## 2014-07-04 DIAGNOSIS — I10 Essential (primary) hypertension: Secondary | ICD-10-CM

## 2014-07-04 DIAGNOSIS — Z79810 Long term (current) use of selective estrogen receptor modulators (SERMs): Secondary | ICD-10-CM

## 2014-07-04 DIAGNOSIS — C9112 Chronic lymphocytic leukemia of B-cell type in relapse: Secondary | ICD-10-CM

## 2014-07-04 DIAGNOSIS — C9192 Lymphoid leukemia, unspecified, in relapse: Secondary | ICD-10-CM

## 2014-07-04 DIAGNOSIS — C50912 Malignant neoplasm of unspecified site of left female breast: Secondary | ICD-10-CM

## 2014-07-04 DIAGNOSIS — C8590 Non-Hodgkin lymphoma, unspecified, unspecified site: Secondary | ICD-10-CM

## 2014-07-04 LAB — CBC WITH DIFFERENTIAL/PLATELET
BASO%: 1.3 % (ref 0.0–2.0)
BASOS ABS: 0.1 10*3/uL (ref 0.0–0.1)
EOS%: 1.4 % (ref 0.0–7.0)
Eosinophils Absolute: 0.1 10*3/uL (ref 0.0–0.5)
HCT: 27.8 % — ABNORMAL LOW (ref 34.8–46.6)
HGB: 9.5 g/dL — ABNORMAL LOW (ref 11.6–15.9)
LYMPH%: 78.1 % — AB (ref 14.0–49.7)
MCH: 39.8 pg — AB (ref 25.1–34.0)
MCHC: 34 g/dL (ref 31.5–36.0)
MCV: 117.3 fL — AB (ref 79.5–101.0)
MONO#: 0.8 10*3/uL (ref 0.1–0.9)
MONO%: 9.4 % (ref 0.0–14.0)
NEUT#: 0.8 10*3/uL — ABNORMAL LOW (ref 1.5–6.5)
NEUT%: 9.8 % — ABNORMAL LOW (ref 38.4–76.8)
PLATELETS: 62 10*3/uL — AB (ref 145–400)
RBC: 2.37 10*6/uL — AB (ref 3.70–5.45)
RDW: 16.1 % — AB (ref 11.2–14.5)
WBC: 8.1 10*3/uL (ref 3.9–10.3)
lymph#: 6.3 10*3/uL — ABNORMAL HIGH (ref 0.9–3.3)

## 2014-07-04 LAB — COMPREHENSIVE METABOLIC PANEL (CC13)
ALBUMIN: 3.8 g/dL (ref 3.5–5.0)
ALT: 24 U/L (ref 0–55)
AST: 26 U/L (ref 5–34)
Alkaline Phosphatase: 65 U/L (ref 40–150)
Anion Gap: 7 mEq/L (ref 3–11)
BILIRUBIN TOTAL: 0.7 mg/dL (ref 0.20–1.20)
BUN: 20.1 mg/dL (ref 7.0–26.0)
CO2: 24 mEq/L (ref 22–29)
Calcium: 9.5 mg/dL (ref 8.4–10.4)
Chloride: 108 mEq/L (ref 98–109)
Creatinine: 1.2 mg/dL — ABNORMAL HIGH (ref 0.6–1.1)
Glucose: 144 mg/dl — ABNORMAL HIGH (ref 70–140)
POTASSIUM: 4.2 meq/L (ref 3.5–5.1)
Sodium: 139 mEq/L (ref 136–145)
Total Protein: 6.1 g/dL — ABNORMAL LOW (ref 6.4–8.3)

## 2014-07-04 LAB — LACTATE DEHYDROGENASE (CC13): LDH: 493 U/L — AB (ref 125–245)

## 2014-07-04 LAB — TECHNOLOGIST REVIEW

## 2014-07-04 MED ORDER — SODIUM CHLORIDE 0.9 % IJ SOLN
10.0000 mL | INTRAMUSCULAR | Status: DC | PRN
  Administered 2014-07-04: 10 mL via INTRAVENOUS
  Filled 2014-07-04: qty 10

## 2014-07-04 MED ORDER — HEPARIN SOD (PORK) LOCK FLUSH 100 UNIT/ML IV SOLN
500.0000 [IU] | Freq: Once | INTRAVENOUS | Status: AC
  Administered 2014-07-04: 500 [IU] via INTRAVENOUS
  Filled 2014-07-04: qty 5

## 2014-07-04 NOTE — Patient Instructions (Signed)

## 2014-07-04 NOTE — Progress Notes (Signed)
[place]   Chief Complaint  Patient presents with  . Follow-up    HPI: [name] 80 y.o. female diagnosed with relapsed CLL and well differentiated lymphocytic lymphoma.  Has also been diagnosed in the past with breast cancer; and is status post left mastectomy.  Patient is status post multiple chemotherapies in the past; with the most recent chemotherapy being chlorambucil which was discontinued in April 2015 do to progressive neutropenia/from the cytopenia.  Patient is under observation only for her relapsed CLL at this present time.  Patient continues on daily tamoxifen for her breast cancer maintenance therapy.  HPI   ROS  Past Medical History  Diagnosis Date  . 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 Stroke 05/2009    05/2009  Right upper extrem/leg weakness; nuchal pain;  . Benign essential HTN   . Macrocytic anemia   . Vertigo     ?TIA - MRI brain 5/13  No new CVA  . Pleurisy     01/16/12 pleuritic right chest pain  V/Q scan [facility] low probability PE  . Breast cancer left T2N0 S/P mastectomy and SLN Bx 09/2010 2009  . CLL (chronic lymphoid leukemia) in relapse   . Arthritis   . Hyperlipidemia   . Thrombocytopenia, unspecified 06/15/2013    Past Surgical History  Procedure Laterality Date  . Tonsillectomy    . Abdominal hysterectomy    . Right parotid gland    . Hemorrhoid surgery    . Breast biopsy Left 2009  . Mastectomy  02/ 2012    left breast  . Porta-cath      right chest    has Breast cancer left T2N0 S/P mastectomy and SLN Bx 09/2010; Low grade malignant lymphoma; Stroke; Benign essential HTN; Macrocytic anemia; Vertigo; Pleurisy; CLL (chronic lymphoid leukemia) in relapse; Hyperlipidemia; Memory loss; Thrombocytopenia, unspecified; Numbness and tingling; and Hypertension on her problem list.     is allergic  to latex; obinutuzumab; and penicillins.    Medication List       This list is accurate as of: 07/04/14 11:59 PM.  Always use your most recent med list.               acetaminophen 500 MG tablet  Commonly known as:  TYLENOL  Take 125 mg by mouth every 6 (six) hours as needed. For pain     acyclovir 400 MG tablet  Commonly known as:  ZOVIRAX  TAKE 1 TABLET BY MOUTH DAILY.     amLODipine 10 MG tablet  Commonly known as:  NORVASC  Take 1 tablet (10 mg total) by mouth daily.     atenolol 50 MG tablet  Commonly known as:  TENORMIN  TAKE 1 TABLET BY MOUTH EVERY DAY     atorvastatin 10 MG tablet  Commonly known as:  LIPITOR  TAKE 1 TABLET (10 MG TOTAL) BY MOUTH DAILY.     calcium carbonate 500 MG chewable tablet  Commonly known as:  TUMS - dosed in mg elemental calcium  Chew 1 tablet by mouth as needed. For stomach     clotrimazole 10 MG troche  Commonly known as:  MYCELEX  Take 10 mg by mouth as needed.     donepezil 5 MG tablet  Commonly known as:  ARICEPT  TAKE 1 TABLET (5 MG TOTAL) BY MOUTH AT BEDTIME.  folic acid 1 MG tablet  Commonly known as:  FOLVITE  Take 1 tablet (1 mg total) by mouth daily.     hydrochlorothiazide 12.5 MG capsule  Commonly known as:  MICROZIDE  TAKE ONE CAPSULE BY MOUTH EVERY DAY     KLOR-CON M20 20 MEQ tablet  Generic drug:  potassium chloride SA  TAKE 1 TABLET (20 MEQ TOTAL) BY MOUTH DAILY AS NEEDED.     lidocaine-prilocaine cream  Commonly known as:  EMLA  APPLY TOPICALLY AS NEEDED. APPLY TO PAC 1 HOUR PRIOR TO PROCEDURE. DO NOT RUB IN.     meclizine 12.5 MG tablet  Commonly known as:  ANTIVERT  TAKE 1 TABLET (12.5 MG TOTAL) BY MOUTH 3 (THREE) TIMES DAILY AS NEEDED FOR DIZZINESS.     pantoprazole 40 MG tablet  Commonly known as:  PROTONIX  Take 1 tablet (40 mg total) by mouth daily.     prochlorperazine 10 MG tablet  Commonly known as:  COMPAZINE  Take 1 tablet (10 mg total) by mouth every 6 (six) hours as needed. For  nausea     tamoxifen 20 MG tablet  Commonly known as:  NOLVADEX  TAKE 1 TABLET BY MOUTH DAILY         PHYSICAL EXAMINATION  Blood pressure 198/64, pulse 62, temperature 98.3 F (36.8 C), temperature source Oral, resp. rate 18, height 5\' 3"  (1.6 m), weight 253 lb 9.6 oz (115.032 kg), SpO2 99.00%.  Physical Exam  Nursing note and vitals reviewed. Constitutional: She is oriented to person, place, and time and well-developed, well-nourished, and in no distress.  HENT:  Head: Normocephalic and atraumatic.  Right Ear: External ear normal.  Left Ear: External ear normal.  Nose: Nose normal.  Mouth/Throat: Oropharynx is clear and moist. No oropharyngeal exudate.  Eyes: Conjunctivae and EOM are normal. Pupils are equal, round, and reactive to light. Right eye exhibits no discharge. Left eye exhibits no discharge. No scleral icterus.  No specific vision difficulties noted on exam.  Patient was able to ambulate with only the assistance of a walker.  Neck: Normal range of motion. Neck supple. No JVD present. No tracheal deviation present. No thyromegaly present.  Cardiovascular: Normal rate, regular rhythm, normal heart sounds and intact distal pulses.  Exam reveals no friction rub.   No murmur heard. Pulmonary/Chest: Effort normal and breath sounds normal. No respiratory distress. She has no wheezes.  Abdominal: Soft. Bowel sounds are normal. She exhibits no distension. There is no tenderness. There is no rebound.  Musculoskeletal: Normal range of motion. She exhibits no edema and no tenderness.  Lymphadenopathy:    She has no cervical adenopathy.  Neurological: She is alert and oriented to person, place, and time. Gait normal.  Some trace right-sided weakness as baseline for the patient status post CVA.  Skin: Skin is warm and dry. No rash noted. No erythema.  Psychiatric: Affect normal.    LABORATORY DATA:. CBC  Lab Results  Component Value Date   WBC 8.1 07/04/2014   RBC 2.37*  07/04/2014   HGB 9.5* 07/04/2014   HCT 27.8* 07/04/2014   PLT 62* 07/04/2014   MCV 117.3* 07/04/2014   MCH 39.8* 07/04/2014   MCHC 34.0 07/04/2014   RDW 16.1* 07/04/2014   LYMPHSABS 6.3* 07/04/2014   MONOABS 0.8 07/04/2014   EOSABS 0.1 07/04/2014   BASOSABS 0.1 07/04/2014     CMET  Lab Results  Component Value Date   NA 139 07/04/2014   K 4.2 07/04/2014  CL 108 02/27/2014   CO2 24 07/04/2014   GLUCOSE 144* 07/04/2014   BUN 20.1 07/04/2014   CREATININE 1.2* 07/04/2014   CALCIUM 9.5 07/04/2014   PROT 6.1* 07/04/2014   ALBUMIN 3.8 07/04/2014   AST 26 07/04/2014   ALT 24 07/04/2014   ALKPHOS 65 07/04/2014   BILITOT 0.70 07/04/2014   GFRNONAA 56* 07/07/2013   GFRAA 64* 07/07/2013    LDH: 493  ASSESSMENT/PLAN:    Breast cancer left T2N0 S/P mastectomy and SLN Bx 09/2010  Assessment & Plan Patient continues to take tamoxifen on a daily basis.   CLL (chronic lymphoid leukemia) in relapse  Assessment & Plan Patient's pancytopenia does appear stable at present.  Patient continues neutropenic with an ANC of 0.8 today.  Briefly reviewed all neutropenia guidelines with both patient and her family member today.  Hemoglobin is essentially stable at 9.5; and platelet count is 62 today.  Patient denies any worsening issues with either easy bleeding or bruising.  Patient will continue on observation for her CLL/lymphoma.  Patient has plans to return for a followup visit on 07/24/2014.  Patient is to call in the interim if she has any new or worsening symptoms whatsoever.   Hypertension  Assessment & Plan Patient typically takes amlodipine, atenolol, and hydrochlorothiazide 12.5 mg on a daily basis.  Patient states that she has not taken her blood pressure medications today.  Blood pressure was elevated to 200/70 while at the cancer Center.  Patient's bilateral blurred vision may be secondary to hypertension.  Recommended to patient and her family member of that we would give [place] some antihypertensive  medications while at the [facility]; the patient stated that she preferred to go home and take her blood pressure medications.  Patient was noted have no new onset weakness whatsoever.  She does have some very mild right-sided weakness post old CVA.  Patient uses a walker for ambulation.  Patient states that she does not have a blood pressure cuff at home.  Advised patient and her family member of that she should obtain one on the way home to keep a check on her blood pressure.  Have advised the patient that we will order a ophthalmology referral or the patient.  Also advised both patient and her family member that she should call or go directed to the emergency department if she develops any new or worsening symptoms whatsoever.    Patient stated understanding of all instructions; and was in agreement with this plan of care. The patient knows to call the clinic with any problems, questions or concerns.   Review/collaboration with Dr. Benay Spice regarding all aspects of patient's visit today.   Total time spent with patient was 40 minutes;  with greater than 80 percent of that time spent in face to face counseling regarding her symptoms, and coordination of care and follow up.  Disclaimer: This note was dictated with voice recognition software. Similar sounding words can inadvertently be transcribed and may not be corrected upon review.   Drue Second, NP 07/05/2014

## 2014-07-05 ENCOUNTER — Encounter: Payer: Self-pay | Admitting: Nurse Practitioner

## 2014-07-05 DIAGNOSIS — I10 Essential (primary) hypertension: Secondary | ICD-10-CM | POA: Insufficient documentation

## 2014-07-05 NOTE — Assessment & Plan Note (Signed)
Patient typically takes amlodipine, atenolol, and hydrochlorothiazide 12.5 mg on a daily basis.  Patient states that she has not taken her blood pressure medications today.  Blood pressure was elevated to 200/70 while at the cancer Center.  Patient's bilateral blurred vision may be secondary to hypertension.  Recommended to patient and her family member of that we would give [place] some antihypertensive medications while at the [place]; the patient stated that she preferred to go home and take her blood pressure medications.  Patient was noted have no new onset weakness whatsoever.  She does have some very mild right-sided weakness post old CVA.  Patient uses a walker for ambulation.  Patient states that she does not have a blood pressure cuff at home.  Advised patient and her family member of that she should obtain one on the way home to keep a check on her blood pressure.  Have advised the patient that we will order a ophthalmology referral or the patient.  Also advised both patient and her family member that she should call or go directed to the emergency department if she develops any new or worsening symptoms whatsoever.

## 2014-07-05 NOTE — Assessment & Plan Note (Signed)
Patient's pancytopenia does appear stable at present.  Patient continues neutropenic with an ANC of 0.8 today.  Briefly reviewed all neutropenia guidelines with both patient and her family member today.  Hemoglobin is essentially stable at 9.5; and platelet count is 62 today.  Patient denies any worsening issues with either easy bleeding or bruising.  Patient will continue on observation for her CLL/lymphoma.  Patient has plans to return for a followup visit on 07/24/2014.  Patient is to call in the interim if she has any new or worsening symptoms whatsoever.

## 2014-07-05 NOTE — Addendum Note (Signed)
Addended by: Susanne Borders on: 07/05/2014 06:32 PM   Modules accepted: Orders

## 2014-07-05 NOTE — Assessment & Plan Note (Signed)
Patient continues to take tamoxifen on a daily basis.

## 2014-07-06 ENCOUNTER — Other Ambulatory Visit: Payer: Self-pay | Admitting: *Deleted

## 2014-07-06 DIAGNOSIS — C50919 Malignant neoplasm of unspecified site of unspecified female breast: Secondary | ICD-10-CM

## 2014-07-06 MED ORDER — PANTOPRAZOLE SODIUM 40 MG PO TBEC: 40.0000 mg | DELAYED_RELEASE_TABLET | Freq: Every day | ORAL | Status: DC

## 2014-07-06 MED ORDER — TAMOXIFEN CITRATE 20 MG PO TABS: 20.0000 mg | ORAL_TABLET | Freq: Every day | ORAL | Status: DC

## 2014-07-07 ENCOUNTER — Telehealth: Payer: Self-pay | Admitting: *Deleted

## 2014-07-07 NOTE — Telephone Encounter (Signed)
Message copied by Patton Salles on Fri Jul 07, 2014 11:58 AM ------      Message from: Drue Second R      Created: Wed Jul 05, 2014  6:30 PM       PROVIDER:  [place]      Triage: follow up call 24-48 hours please. ------

## 2014-07-07 NOTE — Telephone Encounter (Signed)
Phone not accepting messages

## 2014-07-22 ENCOUNTER — Other Ambulatory Visit: Payer: Self-pay | Admitting: Internal Medicine

## 2014-07-24 ENCOUNTER — Telehealth: Payer: Self-pay | Admitting: Oncology

## 2014-07-24 ENCOUNTER — Telehealth: Payer: Self-pay | Admitting: Nurse Practitioner

## 2014-07-24 ENCOUNTER — Ambulatory Visit (HOSPITAL_BASED_OUTPATIENT_CLINIC_OR_DEPARTMENT_OTHER): Payer: Medicare Other | Admitting: Oncology

## 2014-07-24 VITALS — BP 172/68 | HR 59 | Temp 98.1°F | Resp 20 | Ht 63.0 in | Wt 256.3 lb

## 2014-07-24 DIAGNOSIS — C9192 Lymphoid leukemia, unspecified, in relapse: Secondary | ICD-10-CM

## 2014-07-24 DIAGNOSIS — C50912 Malignant neoplasm of unspecified site of left female breast: Secondary | ICD-10-CM

## 2014-07-24 DIAGNOSIS — D709 Neutropenia, unspecified: Secondary | ICD-10-CM

## 2014-07-24 DIAGNOSIS — C9112 Chronic lymphocytic leukemia of B-cell type in relapse: Secondary | ICD-10-CM

## 2014-07-24 DIAGNOSIS — D696 Thrombocytopenia, unspecified: Secondary | ICD-10-CM

## 2014-07-24 DIAGNOSIS — D61818 Other pancytopenia: Secondary | ICD-10-CM

## 2014-07-24 DIAGNOSIS — Z23 Encounter for immunization: Secondary | ICD-10-CM

## 2014-07-24 MED ORDER — INFLUENZA VAC SPLIT QUAD 0.5 ML IM SUSY
0.5000 mL | PREFILLED_SYRINGE | Freq: Once | INTRAMUSCULAR | Status: AC
  Administered 2014-07-24: 0.5 mL via INTRAMUSCULAR
  Filled 2014-07-24: qty 0.5

## 2014-07-24 NOTE — Progress Notes (Signed)
  [facility] OFFICE PROGRESS NOTE   Diagnosis: CLL  INTERVAL HISTORY:   [name] returns as scheduled. She denies fever. She complains of malaise. Intermittent night sweats have been present for years. She has discomfort at the right anterior iliac area with certain movements. No further blurred vision.  Objective:  Vital signs in last 24 hours:  Blood pressure 172/68, pulse 59, temperature 98.1 F (36.7 C), temperature source Oral, resp. rate 20, height $RemoveBe'5\' 3"'bIxBcpiQd$  (1.6 m), weight 256 lb 4.8 oz (116.257 kg), SpO2 100.00%.    HEENT: Neck without mass Lymphatics: No cervical, supraclavicular, right axillary, or inguinal nodes. Soft tubular 3 cm left axillary node versus a prominent fat pad Resp: Lungs clear bilaterally Cardio: Regular rate and rhythm GI: No hepatosplenomegaly, mild tenderness in the right abdomen, no mass Vascular: No leg edema  Musculoskeletal: Pain with flexion at the right knee, tenderness over the right anterior iliac   Portacath/PICC-without erythema  Lab Results:  Lab Results  Component Value Date   WBC 8.1 07/04/2014   HGB 9.5* 07/04/2014   HCT 27.8* 07/04/2014   MCV 117.3* 07/04/2014   PLT 62* 07/04/2014   NEUTROABS 0.8* 07/04/2014   LDH 493 on 07/04/2014  Medications: I have reviewed the patient's current medications.  Assessment/Plan: 1. Relapsed CLL/well-differentiated lymphocytic lymphoma. She began a trial of Ibrutinib 05/27/2013. It was stopped on 06/10/2013 due to profound fatigue, nausea and anorexia. In addition, labs on 06/10/2013 showed significant deterioration in blood counts with the platelet count down to 3000, felt to be secondary to disease progression as this was how she initially presented. Treatment was initiated with Obinutuzumab and Leukeran on 06/23/2013. Blood counts improved. Obinutuzumab discontinued following cycle 3 due to development of chest tightness requiring hospitalization. She ruled out for an MI. Low dose daily  chlorambucil continued until 01/09/2014 when it was placed on hold due to progressive neutropenia/thrombocytopenia. 2. Extreme fatigue, anorexia while on Ibrutinib. 3. Multifocal invasive and noninvasive cancers left breast status post mastectomy 10/09/2010 (mpT2,mpN0) currently on tamoxifen. 4. Hypertension. 5. Recurrent atypical neurologic symptoms with dizziness, vertigo, paresthesias. 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 Mild changes in small vessels. No acute changes. Aspirin placed on hold due to severe thrombocytopenia. 6. Neutropenia/thrombocytopenia. Stable. 7.    Disposition:  [name] has persistent pancytopenia, likely secondary to CLL involving the bone marrow. We will check a CBC and LDH when she returns in 3 weeks. We will consider a bone marrow biopsy to include cytogenetics if the counts remained low. Treatment options for the CLL are limited. She received an influenza vaccine today. We will administer a Prevnar vaccine when she returns in 3 weeks.    Betsy Coder, MD  07/24/2014  12:24 PM

## 2014-07-24 NOTE — Telephone Encounter (Signed)
, °

## 2014-07-24 NOTE — Telephone Encounter (Signed)
Pt confirmed labs/ov per 10/26 POF, called for referral w/Opthalmology Dr. Chauncey Fischer apt 11/03 at 10am confirmed w/pt,  gave pt AVS.... KJ

## 2014-07-25 ENCOUNTER — Telehealth: Payer: Self-pay | Admitting: Oncology

## 2014-07-25 NOTE — Telephone Encounter (Signed)
Faxed pt office note to Dr Cathi Roan [phone]

## 2014-08-09 ENCOUNTER — Telehealth: Payer: Self-pay

## 2014-08-09 ENCOUNTER — Other Ambulatory Visit: Payer: Self-pay

## 2014-08-09 DIAGNOSIS — H539 Unspecified visual disturbance: Secondary | ICD-10-CM

## 2014-08-09 NOTE — Telephone Encounter (Signed)
Please call pt back [phone]

## 2014-08-09 NOTE — Telephone Encounter (Signed)
Pt knows that labs are entered and will come in tomorrow to have them done.

## 2014-08-09 NOTE — Telephone Encounter (Signed)
Incoming fax from [facility].   Needs lab orders for ESR and CRP.  These have been entered.   VMM left for pt to return my call.    RE: Labs entered and need to be completed.

## 2014-08-14 ENCOUNTER — Other Ambulatory Visit: Payer: Self-pay | Admitting: Internal Medicine

## 2014-08-14 ENCOUNTER — Ambulatory Visit: Payer: Medicare Other

## 2014-08-14 ENCOUNTER — Telehealth: Payer: Self-pay | Admitting: Oncology

## 2014-08-14 ENCOUNTER — Ambulatory Visit (HOSPITAL_BASED_OUTPATIENT_CLINIC_OR_DEPARTMENT_OTHER): Payer: Medicare Other | Admitting: Nurse Practitioner

## 2014-08-14 ENCOUNTER — Other Ambulatory Visit (HOSPITAL_BASED_OUTPATIENT_CLINIC_OR_DEPARTMENT_OTHER): Payer: Medicare Other

## 2014-08-14 VITALS — BP 150/90 | HR 65 | Temp 97.9°F | Resp 18 | Ht 63.0 in | Wt 248.3 lb

## 2014-08-14 DIAGNOSIS — Z23 Encounter for immunization: Secondary | ICD-10-CM

## 2014-08-14 DIAGNOSIS — C9112 Chronic lymphocytic leukemia of B-cell type in relapse: Secondary | ICD-10-CM

## 2014-08-14 DIAGNOSIS — C9110 Chronic lymphocytic leukemia of B-cell type not having achieved remission: Secondary | ICD-10-CM

## 2014-08-14 DIAGNOSIS — I10 Essential (primary) hypertension: Secondary | ICD-10-CM

## 2014-08-14 DIAGNOSIS — R5383 Other fatigue: Secondary | ICD-10-CM

## 2014-08-14 DIAGNOSIS — D696 Thrombocytopenia, unspecified: Secondary | ICD-10-CM

## 2014-08-14 DIAGNOSIS — D709 Neutropenia, unspecified: Secondary | ICD-10-CM

## 2014-08-14 DIAGNOSIS — C50412 Malignant neoplasm of upper-outer quadrant of left female breast: Secondary | ICD-10-CM

## 2014-08-14 DIAGNOSIS — D61818 Other pancytopenia: Secondary | ICD-10-CM

## 2014-08-14 DIAGNOSIS — C9192 Lymphoid leukemia, unspecified, in relapse: Secondary | ICD-10-CM

## 2014-08-14 DIAGNOSIS — Z95828 Presence of other vascular implants and grafts: Secondary | ICD-10-CM

## 2014-08-14 LAB — COMPREHENSIVE METABOLIC PANEL (CC13)
ALBUMIN: 4 g/dL (ref 3.5–5.0)
ALK PHOS: 59 U/L (ref 40–150)
ALT: 25 U/L (ref 0–55)
AST: 28 U/L (ref 5–34)
Anion Gap: 7 mEq/L (ref 3–11)
BILIRUBIN TOTAL: 0.71 mg/dL (ref 0.20–1.20)
BUN: 22.1 mg/dL (ref 7.0–26.0)
CO2: 22 mEq/L (ref 22–29)
Calcium: 9.3 mg/dL (ref 8.4–10.4)
Chloride: 109 mEq/L (ref 98–109)
Creatinine: 1.2 mg/dL — ABNORMAL HIGH (ref 0.6–1.1)
GLUCOSE: 99 mg/dL (ref 70–140)
POTASSIUM: 4.5 meq/L (ref 3.5–5.1)
SODIUM: 137 meq/L (ref 136–145)
TOTAL PROTEIN: 6.2 g/dL — AB (ref 6.4–8.3)

## 2014-08-14 LAB — CBC WITH DIFFERENTIAL/PLATELET
BASO%: 2.1 % — AB (ref 0.0–2.0)
Basophils Absolute: 0.2 10*3/uL — ABNORMAL HIGH (ref 0.0–0.1)
EOS ABS: 0.1 10*3/uL (ref 0.0–0.5)
EOS%: 0.8 % (ref 0.0–7.0)
HCT: 27.6 % — ABNORMAL LOW (ref 34.8–46.6)
HGB: 9.3 g/dL — ABNORMAL LOW (ref 11.6–15.9)
LYMPH%: 80.3 % — ABNORMAL HIGH (ref 14.0–49.7)
MCH: 40.1 pg — ABNORMAL HIGH (ref 25.1–34.0)
MCHC: 33.6 g/dL (ref 31.5–36.0)
MCV: 119.4 fL — ABNORMAL HIGH (ref 79.5–101.0)
MONO#: 0.7 10*3/uL (ref 0.1–0.9)
MONO%: 8.2 % (ref 0.0–14.0)
NEUT#: 0.7 10*3/uL — ABNORMAL LOW (ref 1.5–6.5)
NEUT%: 8.6 % — ABNORMAL LOW (ref 38.4–76.8)
Platelets: 58 10*3/uL — ABNORMAL LOW (ref 145–400)
RBC: 2.31 10*6/uL — AB (ref 3.70–5.45)
RDW: 16.7 % — AB (ref 11.2–14.5)
WBC: 8.2 10*3/uL (ref 3.9–10.3)
lymph#: 6.6 10*3/uL — ABNORMAL HIGH (ref 0.9–3.3)

## 2014-08-14 LAB — C-REACTIVE PROTEIN: CRP: <0.5 mg/dL (ref <0.60)

## 2014-08-14 LAB — TECHNOLOGIST REVIEW

## 2014-08-14 LAB — LACTATE DEHYDROGENASE (CC13): LDH: 542 U/L — AB (ref 125–245)

## 2014-08-14 MED ORDER — SODIUM CHLORIDE 0.9 % IJ SOLN
10.0000 mL | INTRAMUSCULAR | Status: DC | PRN
  Administered 2014-08-14: 10 mL via INTRAVENOUS
  Filled 2014-08-14: qty 10

## 2014-08-14 MED ORDER — PNEUMOCOCCAL 13-VAL CONJ VACC IM SUSP
0.5000 mL | Freq: Once | INTRAMUSCULAR | Status: AC
  Administered 2014-08-14: 0.5 mL via INTRAMUSCULAR
  Filled 2014-08-14: qty 0.5

## 2014-08-14 MED ORDER — HEPARIN SOD (PORK) LOCK FLUSH 100 UNIT/ML IV SOLN
500.0000 [IU] | Freq: Once | INTRAVENOUS | Status: AC
  Administered 2014-08-14: 500 [IU] via INTRAVENOUS
  Filled 2014-08-14: qty 5

## 2014-08-14 NOTE — Telephone Encounter (Signed)
gv pt dtr appt schedule for dec.  °

## 2014-08-14 NOTE — Patient Instructions (Signed)

## 2014-08-14 NOTE — Progress Notes (Signed)
  [facility] OFFICE PROGRESS NOTE   Diagnosis:  CLL   INTERVAL HISTORY:   [name] returns as scheduled. No interim illnesses or infections. She denies fever. She continues to have periodic night sweats. No shaking chills. She has recently noted a lump in the right axilla with associated tenderness.  Objective:  Vital signs in last 24 hours:  Blood pressure 150/90, pulse 65, temperature 97.9 F (36.6 C), temperature source Oral, resp. rate 18, height 5\' 3"  (1.6 m), weight 248 lb 4.8 oz (112.628 kg), SpO2 100 %.    HEENT: no thrush or ulcers. Lymphatics: bilateral approximate 3 cm axillary lymph nodes. Resp: lungs clear bilaterally. Cardio: regular rate and rhythm. GI: abdomen soft and nontender. No organomegaly. Vascular: trace lower leg edema bilaterally.   Port-A-Cath without erythema    Lab Results:  Lab Results  Component Value Date   WBC 8.2 08/14/2014   HGB 9.3* 08/14/2014   HCT 27.6* 08/14/2014   MCV 119.4* 08/14/2014   PLT 58* 08/14/2014   NEUTROABS 0.7* 08/14/2014    Imaging:  No results found.  Medications: I have reviewed the patient's current medications.  Assessment/Plan: 1. Relapsed CLL/well-differentiated lymphocytic lymphoma. She began a trial of Ibrutinib 05/27/2013. It was stopped on 06/10/2013 due to profound fatigue, nausea and anorexia. In addition, labs on 06/10/2013 showed significant deterioration in blood counts with the platelet count down to 3000, felt to be secondary to disease progression as this was how she initially presented. Treatment was initiated with Obinutuzumab and Leukeran on 06/23/2013. Blood counts improved. Obinutuzumab discontinued following cycle 3 due to development of chest tightness requiring hospitalization. She ruled out for an MI. Low dose daily chlorambucil continued until 01/09/2014 when it was placed on hold due to progressive neutropenia/thrombocytopenia. 2. Extreme fatigue, anorexia while on  Ibrutinib. 3. Multifocal invasive and noninvasive cancers left breast status post mastectomy 10/09/2010 (mpT2,mpN0) currently on tamoxifen. 4. Hypertension. 5. Recurrent atypical neurologic symptoms with dizziness, vertigo, paresthesias. 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 Mild changes in small vessels. No acute changes. Aspirin placed on hold due to severe thrombocytopenia. 6. Neutropenia/thrombocytopenia. Stable.   Disposition: [name] blood counts remain stable. She has persistent pancytopenia which is likely secondary to CLL involving the bone marrow. We will continue to follow on an observation approach with routine labs. She will return in 3 weeks for labs and in 6 weeks for a followup visit.  The Prevnar vaccine was administered at today's visit.  Plan reviewed with Dr. Benay Spice.    Ned Card ANP/GNP-BC   08/14/2014  3:05 PM

## 2014-08-14 NOTE — Patient Instructions (Signed)

## 2014-08-15 ENCOUNTER — Telehealth: Payer: Self-pay

## 2014-08-15 ENCOUNTER — Other Ambulatory Visit: Payer: Self-pay | Admitting: Nurse Practitioner

## 2014-08-15 DIAGNOSIS — C9192 Lymphoid leukemia, unspecified, in relapse: Secondary | ICD-10-CM

## 2014-08-15 DIAGNOSIS — C9112 Chronic lymphocytic leukemia of B-cell type in relapse: Secondary | ICD-10-CM

## 2014-08-15 DIAGNOSIS — D61818 Other pancytopenia: Secondary | ICD-10-CM

## 2014-08-15 LAB — SEDIMENTATION RATE: Sed Rate: 10 mm/hr (ref 0–22)

## 2014-08-15 NOTE — Telephone Encounter (Signed)
Pt daughter called regarding Bone Marrow Biopsy. Called and informed her Dr.Sherrill, MD reviewed pts labs and decided he wanted to go ahead with the bone marrow biopsy. Daughter states they called to schedule it and wanted to double check. Pt verbalized all understanding and denies any further questions or concerns.

## 2014-08-19 ENCOUNTER — Other Ambulatory Visit: Payer: Self-pay | Admitting: Oncology

## 2014-08-29 ENCOUNTER — Ambulatory Visit (HOSPITAL_COMMUNITY): Payer: Medicare Other

## 2014-08-29 ENCOUNTER — Other Ambulatory Visit (HOSPITAL_COMMUNITY): Payer: Medicare Other

## 2014-08-30 ENCOUNTER — Ambulatory Visit (HOSPITAL_COMMUNITY): Payer: Medicare Other

## 2014-08-31 ENCOUNTER — Other Ambulatory Visit: Payer: Self-pay | Admitting: Radiology

## 2014-09-01 ENCOUNTER — Ambulatory Visit (HOSPITAL_COMMUNITY)
Admission: RE | Admit: 2014-09-01 | Discharge: 2014-09-01 | Disposition: A | Discharge status: Home / Self Care | Payer: Medicare Other | Source: Ambulatory Visit | Attending: Nurse Practitioner | Admitting: Nurse Practitioner

## 2014-09-01 ENCOUNTER — Other Ambulatory Visit: Payer: Self-pay | Admitting: Nurse Practitioner

## 2014-09-01 DIAGNOSIS — D61818 Other pancytopenia: Secondary | ICD-10-CM

## 2014-09-01 DIAGNOSIS — C9192 Lymphoid leukemia, unspecified, in relapse: Secondary | ICD-10-CM

## 2014-09-01 DIAGNOSIS — C9112 Chronic lymphocytic leukemia of B-cell type in relapse: Secondary | ICD-10-CM

## 2014-09-01 DIAGNOSIS — R42 Dizziness and giddiness: Secondary | ICD-10-CM

## 2014-09-01 NOTE — Progress Notes (Signed)
Patient and daughter arrived at 0900 for bone marrow biopsy. Patient had a bowl of cheerios and 8oz of water and 8 oz of coffee at 0730. Radiology PA Rowe Robert made aware. Patient cancelled and rescheduled for next Wednesday. Patient and daughter made aware NPO after midnight next Tuesday.

## 2014-09-04 ENCOUNTER — Ambulatory Visit (INDEPENDENT_AMBULATORY_CARE_PROVIDER_SITE_OTHER)
Admission: RE | Admit: 2014-09-04 | Discharge: 2014-09-04 | Disposition: A | Discharge status: Home / Self Care | Payer: Medicare Other | Source: Ambulatory Visit | Attending: Internal Medicine | Admitting: Internal Medicine

## 2014-09-04 ENCOUNTER — Ambulatory Visit (INDEPENDENT_AMBULATORY_CARE_PROVIDER_SITE_OTHER): Payer: Medicare Other | Admitting: Internal Medicine

## 2014-09-04 ENCOUNTER — Encounter: Payer: Self-pay | Admitting: Internal Medicine

## 2014-09-04 ENCOUNTER — Other Ambulatory Visit (HOSPITAL_BASED_OUTPATIENT_CLINIC_OR_DEPARTMENT_OTHER): Payer: Medicare Other

## 2014-09-04 VITALS — BP 140/60 | HR 56 | Temp 98.4°F | Ht 63.0 in | Wt 240.5 lb

## 2014-09-04 DIAGNOSIS — R05 Cough: Secondary | ICD-10-CM

## 2014-09-04 DIAGNOSIS — R059 Cough, unspecified: Secondary | ICD-10-CM

## 2014-09-04 DIAGNOSIS — C9110 Chronic lymphocytic leukemia of B-cell type not having achieved remission: Secondary | ICD-10-CM

## 2014-09-04 DIAGNOSIS — C9192 Lymphoid leukemia, unspecified, in relapse: Secondary | ICD-10-CM

## 2014-09-04 DIAGNOSIS — C9112 Chronic lymphocytic leukemia of B-cell type in relapse: Secondary | ICD-10-CM

## 2014-09-04 DIAGNOSIS — R42 Dizziness and giddiness: Secondary | ICD-10-CM

## 2014-09-04 DIAGNOSIS — E785 Hyperlipidemia, unspecified: Secondary | ICD-10-CM

## 2014-09-04 DIAGNOSIS — I10 Essential (primary) hypertension: Secondary | ICD-10-CM

## 2014-09-04 LAB — MANUAL DIFFERENTIAL
ALC: 9.1 10*3/uL — AB (ref 0.9–3.3)
ANC (CHCC manual diff): 0.5 10*3/uL — ABNORMAL LOW (ref 1.5–6.5)
Band Neutrophils: 0 % (ref 0–10)
Basophil: 0 % (ref 0–2)
Blasts: 0 % (ref 0–0)
EOS%: 0 % (ref 0–7)
LYMPH: 94 % — AB (ref 14–49)
MONO: 1 % (ref 0–14)
MYELOCYTES: 0 % (ref 0–0)
Metamyelocytes: 0 % (ref 0–0)
OTHER CELL: 0 % (ref 0–0)
PLT EST: DECREASED
PROMYELO: 0 % (ref 0–0)
SEG: 5 % — AB (ref 38–77)
Variant Lymph: 0 % (ref 0–0)
nRBC: 0 % (ref 0–0)

## 2014-09-04 LAB — CBC WITH DIFFERENTIAL/PLATELET
HEMATOCRIT: 27.6 % — AB (ref 34.8–46.6)
HGB: 9 g/dL — ABNORMAL LOW (ref 11.6–15.9)
MCH: 39.5 pg — ABNORMAL HIGH (ref 25.1–34.0)
MCHC: 32.7 g/dL (ref 31.5–36.0)
MCV: 121.1 fL — AB (ref 79.5–101.0)
PLATELETS: 50 10*3/uL — AB (ref 145–400)
RBC: 2.28 10*6/uL — AB (ref 3.70–5.45)
RDW: 16.4 % — ABNORMAL HIGH (ref 11.2–14.5)
WBC: 9.7 10*3/uL (ref 3.9–10.3)

## 2014-09-04 MED ORDER — PROMETHAZINE-DM 6.25-15 MG/5ML PO SYRP: 5.0000 mL | ORAL_SOLUTION | Freq: Four times a day (QID) | ORAL | Status: DC | PRN

## 2014-09-04 MED ORDER — ATORVASTATIN CALCIUM 10 MG PO TABS: 10.0000 mg | ORAL_TABLET | Freq: Every day | ORAL | Status: DC

## 2014-09-04 MED ORDER — HYDROCHLOROTHIAZIDE 12.5 MG PO CAPS: 12.5000 mg | ORAL_CAPSULE | Freq: Every day | ORAL | Status: DC

## 2014-09-04 MED ORDER — MECLIZINE HCL 12.5 MG PO TABS: 12.5000 mg | ORAL_TABLET | Freq: Three times a day (TID) | ORAL | Status: DC | PRN

## 2014-09-04 MED ORDER — POTASSIUM CHLORIDE CRYS ER 20 MEQ PO TBCR: 20.0000 meq | EXTENDED_RELEASE_TABLET | Freq: Every day | ORAL | Status: DC

## 2014-09-04 MED ORDER — DONEPEZIL HCL 5 MG PO TABS: 5.0000 mg | ORAL_TABLET | Freq: Every day | ORAL | Status: DC

## 2014-09-04 MED ORDER — ACYCLOVIR 400 MG PO TABS: 400.0000 mg | ORAL_TABLET | Freq: Every day | ORAL | Status: DC

## 2014-09-04 NOTE — Patient Instructions (Signed)
It was good to see you today.  We have reviewed your prior records including labs and tests today  Chest xray ordered today. Your results will be released to [place] (or called to you) after review, usually within 72hours after test completion. If any changes need to be made, you will be notified at that same time.  Medications reviewed and updated, no changes recommended at this time. Refill on medication(s) as discussed today.  Please schedule followup in 6 months, call sooner if problems.

## 2014-09-04 NOTE — Assessment & Plan Note (Signed)
Chronic, recurrent symptoms -uses meclizine as needed - refill today Event August 2014 with oncology reviewed, negative EEG and MRI for acute CVA or change Refer for physical therapy, home health preferred Symptomatic support and reassurance provided

## 2014-09-04 NOTE — Assessment & Plan Note (Signed)
BP Readings from Last 3 Encounters:  09/04/14 140/60  08/14/14 150/90  07/24/14 172/68   amlodipine added October 2014 hospitalization and generally improved The current medical regimen is effective;  continue present plan and medications.

## 2014-09-04 NOTE — Progress Notes (Signed)
Pre visit review using our clinic review tool, if applicable. No additional management support is needed unless otherwise documented below in the visit note. 

## 2014-09-04 NOTE — Assessment & Plan Note (Signed)
Despite hx CVA 05/2009 and TIA 01/2012, denies ever rx'd statin begin atorva 10g qd 01/2013 -reports tolerating well, but ?related to mylagia Recheck lipids annually and adjust as needed

## 2014-09-04 NOTE — Progress Notes (Signed)
Subjective:    Patient ID: [name], female    DOB: August 25, 1934, 80 y.o.   MRN: [id number]  HPI  Patient is here for follow up  Reviewed chronic medical issues and interval medical events  Past Medical History  Diagnosis Date  . 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 Stroke 05/2009    05/2009  Right upper extrem/leg weakness; nuchal pain;  . Benign essential HTN   . Macrocytic anemia   . Vertigo     ?TIA - MRI brain 5/13  No new CVA  . Pleurisy     01/16/12 pleuritic right chest pain  V/Q scan [place] [facility] low probability PE  . Breast cancer left T2N0 S/P mastectomy and SLN Bx 09/2010 2009  . CLL (chronic lymphoid leukemia) in relapse   . Arthritis   . Hyperlipidemia   . Thrombocytopenia, unspecified 06/15/2013    Review of Systems  Constitutional: Positive for fatigue (chronic). Negative for fever and unexpected weight change.  Respiratory: Positive for cough. Negative for shortness of breath and wheezing.   Cardiovascular: Negative for chest pain, palpitations and leg swelling.       Objective:   Physical Exam  BP 140/60 mmHg  Pulse 56  Temp(Src) 98.4 F (36.9 C) (Oral)  Ht 5\' 3"  (1.6 m)  Wt 240 lb 8 oz (109.09 kg)  BMI 42.61 kg/m2  SpO2 98% Wt Readings from Last 3 Encounters:  09/04/14 240 lb 8 oz (109.09 kg)  08/14/14 248 lb 4.8 oz (112.628 kg)  07/24/14 256 lb 4.8 oz (116.257 kg)    Constitutional: She is MO, appears well-developed and well-nourished. No distress. Dtr at side Neck: Normal range of motion. Neck supple. No JVD present. No thyromegaly present.  Cardiovascular: Normal rate, regular rhythm and normal heart sounds.  No murmur heard. No BLE edema. Pulmonary/Chest: Effort normal and breath sounds normal. No respiratory distress. She has no wheezes.  Psychiatric: She has a normal mood and affect. Her behavior is normal.  Judgment and thought content normal.   Lab Results  Component Value Date   WBC 8.2 08/14/2014   HGB 9.3* 08/14/2014   HCT 27.6* 08/14/2014   PLT 58* 08/14/2014   GLUCOSE 99 08/14/2014   CHOL 221* 02/27/2014   TRIG 178.0* 02/27/2014   HDL 65.20 02/27/2014   LDLDIRECT 179.3 02/01/2013   LDLCALC 120* 02/27/2014   ALT 25 08/14/2014   AST 28 08/14/2014   NA 137 08/14/2014   K 4.5 08/14/2014   CL 108 02/27/2014   CREATININE 1.2* 08/14/2014   BUN 22.1 08/14/2014   CO2 22 08/14/2014   TSH 1.95 02/27/2014   INR 0.98 02/19/2012   HGBA1C 5.3 02/27/2014    No results found.     Assessment & Plan:   Cough, chest congestion. Afebrile. Symptoms predominantly nocturnal. Ongoing for 4 days, suspect viral syndrome. Lung exam clear and O2 sats normal. Check chest x-ray to ensure no infiltrates. Continue symptomatic care with PPI and antitussive as needed -refill provided  Problem List Items Addressed This Visit    Benign essential HTN    BP Readings from Last 3 Encounters:  09/04/14 140/60  08/14/14 150/90  07/24/14 172/68   amlodipine added October 2014 hospitalization and generally improved The current medical regimen is effective;  continue present plan and medications.     Relevant Medications      atorvastatin (LIPITOR)  tablet      hydrochlorothiazide (MICROZIDE) 12.5 MG capsule   CLL (chronic lymphoid leukemia) in relapse    Initially diagnosed 2009 with partial remission, then relapse 04/2013 - Associated with thrombocytopenia and anemia and fatigue Adverse infusion reaction to Obinutuzumab(Anti CD20 antibodies) 06/2013 prompting hospitalization/supportive care (IVF, Solumedrol) -on salvage chemo  continue working with onc -prev [name], now [name] - on salvage regimen  continue prophylactic acyclovir     Hyperlipidemia    Despite hx CVA 05/2009 and TIA 01/2012, denies ever rx'd statin begin atorva 10g qd 01/2013 -reports tolerating well, but ?related to  mylagia Recheck lipids annually and adjust as needed    Relevant Medications      atorvastatin (LIPITOR) tablet      hydrochlorothiazide (MICROZIDE) 12.5 MG capsule   Vertigo    Chronic, recurrent symptoms -uses meclizine as needed - refill today Event August 2014 with oncology reviewed, negative EEG and MRI for acute CVA or change Refer for physical therapy, home health preferred Symptomatic support and reassurance provided     Other Visit Diagnoses    Cough    -  Primary    Relevant Orders       DG Chest 2 View

## 2014-09-04 NOTE — Assessment & Plan Note (Signed)
Initially diagnosed 2009 with partial remission, then relapse 04/2013 - Associated with thrombocytopenia and anemia and fatigue Adverse infusion reaction to Obinutuzumab(Anti CD20 antibodies) 06/2013 prompting hospitalization/supportive care (IVF, Solumedrol) -on salvage chemo  continue working with onc -prev Dr.[place], now [place] - on salvage regimen  continue prophylactic acyclovir

## 2014-09-05 ENCOUNTER — Telehealth: Payer: Self-pay | Admitting: Internal Medicine

## 2014-09-05 ENCOUNTER — Other Ambulatory Visit: Payer: Self-pay | Admitting: Radiology

## 2014-09-05 NOTE — Telephone Encounter (Signed)
emmi emailed °

## 2014-09-06 ENCOUNTER — Encounter (HOSPITAL_COMMUNITY): Payer: Self-pay

## 2014-09-06 ENCOUNTER — Ambulatory Visit (HOSPITAL_COMMUNITY)
Admission: RE | Admit: 2014-09-06 | Discharge: 2014-09-06 | Disposition: A | Discharge status: Home / Self Care | Payer: Medicare Other | Source: Ambulatory Visit | Attending: Nurse Practitioner | Admitting: Nurse Practitioner

## 2014-09-06 DIAGNOSIS — I69998 Other sequelae following unspecified cerebrovascular disease: Secondary | ICD-10-CM | POA: Insufficient documentation

## 2014-09-06 DIAGNOSIS — M1990 Unspecified osteoarthritis, unspecified site: Secondary | ICD-10-CM | POA: Insufficient documentation

## 2014-09-06 DIAGNOSIS — I69951 Hemiplegia and hemiparesis following unspecified cerebrovascular disease affecting right dominant side: Secondary | ICD-10-CM | POA: Diagnosis not present

## 2014-09-06 DIAGNOSIS — D539 Nutritional anemia, unspecified: Secondary | ICD-10-CM | POA: Diagnosis not present

## 2014-09-06 DIAGNOSIS — Z79899 Other long term (current) drug therapy: Secondary | ICD-10-CM | POA: Insufficient documentation

## 2014-09-06 DIAGNOSIS — Z853 Personal history of malignant neoplasm of breast: Secondary | ICD-10-CM | POA: Insufficient documentation

## 2014-09-06 DIAGNOSIS — D61818 Other pancytopenia: Secondary | ICD-10-CM | POA: Insufficient documentation

## 2014-09-06 DIAGNOSIS — D696 Thrombocytopenia, unspecified: Secondary | ICD-10-CM | POA: Diagnosis not present

## 2014-09-06 DIAGNOSIS — I10 Essential (primary) hypertension: Secondary | ICD-10-CM | POA: Insufficient documentation

## 2014-09-06 DIAGNOSIS — Z9012 Acquired absence of left breast and nipple: Secondary | ICD-10-CM | POA: Insufficient documentation

## 2014-09-06 DIAGNOSIS — Z856 Personal history of leukemia: Secondary | ICD-10-CM | POA: Diagnosis present

## 2014-09-06 DIAGNOSIS — R61 Generalized hyperhidrosis: Secondary | ICD-10-CM | POA: Diagnosis not present

## 2014-09-06 DIAGNOSIS — E785 Hyperlipidemia, unspecified: Secondary | ICD-10-CM | POA: Insufficient documentation

## 2014-09-06 LAB — PROTIME-INR
INR: 1.14 (ref 0.00–1.49)
Prothrombin Time: 14.7 seconds (ref 11.6–15.2)

## 2014-09-06 LAB — CBC
HCT: 24.5 % — ABNORMAL LOW (ref 36.0–46.0)
Hemoglobin: 8.5 g/dL — ABNORMAL LOW (ref 12.0–15.0)
MCH: 40.5 pg — ABNORMAL HIGH (ref 26.0–34.0)
MCHC: 34.7 g/dL (ref 30.0–36.0)
MCV: 116.7 fL — AB (ref 78.0–100.0)
PLATELETS: 46 10*3/uL — AB (ref 150–400)
RBC: 2.1 MIL/uL — AB (ref 3.87–5.11)
RDW: 15.5 % (ref 11.5–15.5)
WBC: 8.4 10*3/uL (ref 4.0–10.5)

## 2014-09-06 LAB — BONE MARROW EXAM

## 2014-09-06 LAB — APTT: aPTT: 33 seconds (ref 24–37)

## 2014-09-06 MED ORDER — FENTANYL CITRATE 0.05 MG/ML IJ SOLN
INTRAMUSCULAR | Status: AC | PRN
  Administered 2014-09-06 (×2): 50 ug via INTRAVENOUS

## 2014-09-06 MED ORDER — HEPARIN SOD (PORK) LOCK FLUSH 100 UNIT/ML IV SOLN
500.0000 [IU] | Freq: Once | INTRAVENOUS | Status: AC
  Administered 2014-09-06: 500 [IU] via INTRAVENOUS
  Filled 2014-09-06: qty 5

## 2014-09-06 MED ORDER — HYDROCODONE-ACETAMINOPHEN 5-325 MG PO TABS: 1.0000 | ORAL_TABLET | ORAL | Status: DC | PRN

## 2014-09-06 MED ORDER — MIDAZOLAM HCL 2 MG/2ML IJ SOLN
INTRAMUSCULAR | Status: AC
  Filled 2014-09-06: qty 4

## 2014-09-06 MED ORDER — MIDAZOLAM HCL 2 MG/2ML IJ SOLN
INTRAMUSCULAR | Status: AC | PRN
  Administered 2014-09-06: 2 mg via INTRAVENOUS

## 2014-09-06 MED ORDER — FENTANYL CITRATE 0.05 MG/ML IJ SOLN
INTRAMUSCULAR | Status: AC
  Filled 2014-09-06: qty 4

## 2014-09-06 MED ORDER — SODIUM CHLORIDE 0.9 % IV SOLN
INTRAVENOUS | Status: DC
  Administered 2014-09-06: 500 mL via INTRAVENOUS

## 2014-09-06 MED ORDER — SODIUM CHLORIDE 0.9 % IJ SOLN
10.0000 mL | Freq: Once | INTRAMUSCULAR | Status: AC
  Administered 2014-09-06: 10 mL via INTRAVENOUS

## 2014-09-06 NOTE — Discharge Instructions (Signed)

## 2014-09-06 NOTE — Procedures (Signed)
Interventional Radiology Procedure Note  Procedure: CT guided aspirate and core biopsy of right iliac bone Complications: None Recommendations: - Bedrest supine x 2 hrs - Hydrocodone PRN  Pain - Follow biopsy results  Signed,  Heath K. McCullough, MD   

## 2014-09-06 NOTE — H&P (Signed)
Chief Complaint: "I am here for a bone marrow biopsy."  Referring Physician(s): Renee Pain NP  History of Present Illness: [name] is a 80 y.o. female with history of CLL, pancytopenia and c/o frequent night sweats. She has been seen by Ned Card NP and scheduled today for bone marrow biopsy. She denies any chest pain, shortness of breath or palpitations. She denies any active signs of bleeding or excessive bruising. The patient denies any history of sleep apnea or chronic oxygen use. She has previously tolerated sedation without complications.    Past Medical History  Diagnosis Date  . 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 Stroke 05/2009    05/2009  Right upper extrem/leg weakness; nuchal pain;  . Benign essential HTN   . Macrocytic anemia   . Vertigo     ?TIA - MRI brain 5/13  No new CVA  . Pleurisy     01/16/12 pleuritic right chest pain  V/Q scan [place] [facility] low probability PE  . Breast cancer left T2N0 S/P mastectomy and SLN Bx 09/2010 2009  . CLL (chronic lymphoid leukemia) in relapse   . Arthritis   . Hyperlipidemia   . Thrombocytopenia, unspecified 06/15/2013    Past Surgical History  Procedure Laterality Date  . Tonsillectomy    . Abdominal hysterectomy    . Right parotid gland    . Hemorrhoid surgery    . Breast biopsy Left 2009  . Mastectomy  02/ 2012    left breast  . Porta-cath      right chest    Allergies: Latex; Obinutuzumab; and Penicillins  Medications: Prior to Admission medications   Medication Sig Start Date End Date Taking? Authorizing Provider  acetaminophen (TYLENOL) 500 MG tablet Take 125 mg by mouth every 6 (six) hours as needed. For pain   Yes Historical Provider, MD  acyclovir (ZOVIRAX) 400 MG tablet Take 1 tablet (400 mg total) by mouth daily. 09/04/14  Yes Rowe Clack, MD  amLODipine (NORVASC) 10 MG  tablet Take 1 tablet (10 mg total) by mouth daily. 07/07/13  Yes Domenic Polite, MD  atenolol (TENORMIN) 50 MG tablet TAKE 1 TABLET BY MOUTH EVERY DAY 02/16/14  Yes Rowe Clack, MD  atorvastatin (LIPITOR) 10 MG tablet Take 1 tablet (10 mg total) by mouth daily at 6 PM. 09/04/14  Yes Rowe Clack, MD  calcium carbonate (TUMS - DOSED IN MG ELEMENTAL CALCIUM) 500 MG chewable tablet Chew 1 tablet by mouth as needed. For stomach   Yes Historical Provider, MD  clotrimazole (MYCELEX) 10 MG troche Take 10 mg by mouth as needed.   Yes Historical Provider, MD  donepezil (ARICEPT) 5 MG tablet Take 1 tablet (5 mg total) by mouth at bedtime. 09/04/14  Yes Rowe Clack, MD  hydrochlorothiazide (MICROZIDE) 12.5 MG capsule Take 1 capsule (12.5 mg total) by mouth daily. 09/04/14  Yes Rowe Clack, MD  pantoprazole (PROTONIX) 40 MG tablet Take 1 tablet (40 mg total) by mouth daily. 07/06/14  Yes Ladell Pier, MD  potassium chloride SA (KLOR-CON M20) 20 MEQ tablet Take 1 tablet (20 mEq total) by mouth daily. 09/04/14  Yes Rowe Clack, MD  promethazine-dextromethorphan (PROMETHAZINE-DM) 6.25-15 MG/5ML syrup Take 5 mLs by mouth 4 (four) times daily as needed for cough. 09/04/14  Yes Rowe Clack, MD  tamoxifen (NOLVADEX) 20 MG tablet Take  1 tablet (20 mg total) by mouth daily. 07/06/14  Yes Ladell Pier, MD  folic acid (FOLVITE) 1 MG tablet Take 1 tablet (1 mg total) by mouth daily. 10/17/13   Annia Belt, MD  meclizine (ANTIVERT) 12.5 MG tablet Take 1 tablet (12.5 mg total) by mouth 3 (three) times daily as needed for dizziness. 09/04/14   Rowe Clack, MD  prochlorperazine (COMPAZINE) 10 MG tablet Take 1 tablet (10 mg total) by mouth every 6 (six) hours as needed. For nausea 05/27/13   Annia Belt, MD    Family History  Problem Relation Age of Onset  . Breast cancer Mother   . Hyperlipidemia    . Stroke    . Hypertension    . Arthritis      History    Social History  . Marital Status: Widowed    Spouse Name: N/A    Number of Children: 4  . Years of Education: N/A   Occupational History  . retired    Social History Main Topics  . Smoking status: Never Smoker   . Smokeless tobacco: Never Used  . Alcohol Use: No  . Drug Use: No  . Sexual Activity: None   Other Topics Concern  . None   Social History Narrative    Review of Systems: A 12 point ROS discussed and pertinent positives are indicated in the HPI above.  All other systems are negative.  Review of Systems  Vital Signs: BP 174/52 mmHg  Pulse 58  Temp(Src) 98.8 F (37.1 C) (Oral)  Resp 18  Ht 5' 3"  (1.6 m)  Wt 240 lb (108.863 kg)  BMI 42.52 kg/m2  SpO2 100%  Physical Exam  Constitutional: She is oriented to person, place, and time. No distress.  HENT:  Head: Normocephalic and atraumatic.  Neck: No tracheal deviation present.  Cardiovascular: Normal rate and regular rhythm.  Exam reveals no gallop and no friction rub.   No murmur heard. Pulmonary/Chest: Effort normal and breath sounds normal. No respiratory distress. She has no wheezes. She has no rales.  Abdominal: Soft. Bowel sounds are normal. She exhibits no distension. There is no tenderness.  Neurological: She is alert and oriented to person, place, and time.  Skin: She is not diaphoretic.  Right sided chest port intact.  Imaging: Dg Chest 2 View  09/04/2014   CLINICAL DATA:  Cough, congestion for 4 days  EXAM: CHEST  2 VIEW  COMPARISON:  07/06/2013  FINDINGS: Cardiomediastinal silhouette is stable. No acute infiltrate or pulmonary edema. Right IJ Port-A-Cath is stable in position. Degenerative changes thoracic spine.  IMPRESSION: No active cardiopulmonary disease.  Right IJ Port-A-Cath in place.   Electronically Signed   By: Lahoma Crocker M.D.   On: 09/04/2014 11:13    Labs:  CBC:  Recent Labs  07/04/14 1118 08/14/14 1339 09/04/14 1154 09/06/14 0755  WBC 8.1 8.2 9.7 8.4  HGB 9.5* 9.3* 9.0*  8.5*  HCT 27.8* 27.6* 27.6* 24.5*  PLT 62* 58* 50* 46*    COAGS:  Recent Labs  09/06/14 0755  INR 1.14  APTT 33    BMP:  Recent Labs  11/30/13 1034 02/27/14 0925 07/04/14 1118 08/14/14 1339  NA 143 140 139 137  K 4.5 4.2 4.2 4.5  CL  --  108  --   --   CO2 24 24 24 22   GLUCOSE 129 125* 144* 99  BUN 15.3 21 20.1 22.1  CALCIUM 9.7 9.7 9.5 9.3  CREATININE 1.1  1.2 1.2* 1.2*    LIVER FUNCTION TESTS:  Recent Labs  10/17/13 0926 11/30/13 1034 07/04/14 1118 08/14/14 1339  BILITOT 0.72 0.69 0.70 0.71  AST 31 23 26 28   ALT 40 22 24 25   ALKPHOS 67 67 65 59  PROT 6.3* 6.2* 6.1* 6.2*  ALBUMIN 4.1 4.0 3.8 4.0    Assessment and Plan: History of CLL Pancytopenia Scheduled today for image guided bone marrow biopsy with moderate sedation Patient has been NPO, labs reviewed Risks and Benefits discussed with the patient. All of the patient's questions were answered, patient is agreeable to proceed. Consent signed and in chart.  SignedHedy Jacob 09/06/2014, 8:45 AM   Pt seen and examined.  I agree with the PA note above.  Risks, benefits and alternatives to CT guided bone marrow biopsy reviewed.  Mrs. Earnhardt understands and wishes to proceed.   Signed,  Criselda Peaches, MD

## 2014-09-09 ENCOUNTER — Other Ambulatory Visit: Payer: Self-pay | Admitting: Nurse Practitioner

## 2014-09-12 ENCOUNTER — Other Ambulatory Visit: Payer: Self-pay | Admitting: Internal Medicine

## 2014-09-18 ENCOUNTER — Other Ambulatory Visit: Payer: Self-pay | Admitting: Oncology

## 2014-09-18 LAB — CHROMOSOME ANALYSIS, BONE MARROW

## 2014-09-25 ENCOUNTER — Other Ambulatory Visit (HOSPITAL_BASED_OUTPATIENT_CLINIC_OR_DEPARTMENT_OTHER): Payer: Medicare Other

## 2014-09-25 ENCOUNTER — Ambulatory Visit: Payer: Medicare Other

## 2014-09-25 ENCOUNTER — Other Ambulatory Visit: Payer: Self-pay | Admitting: *Deleted

## 2014-09-25 ENCOUNTER — Ambulatory Visit (HOSPITAL_BASED_OUTPATIENT_CLINIC_OR_DEPARTMENT_OTHER): Payer: Medicare Other | Admitting: Oncology

## 2014-09-25 ENCOUNTER — Ambulatory Visit (HOSPITAL_COMMUNITY)
Admission: RE | Admit: 2014-09-25 | Discharge: 2014-09-25 | Disposition: A | Discharge status: Home / Self Care | Payer: Medicare Other | Source: Ambulatory Visit | Attending: Oncology | Admitting: Oncology

## 2014-09-25 ENCOUNTER — Ambulatory Visit (HOSPITAL_BASED_OUTPATIENT_CLINIC_OR_DEPARTMENT_OTHER): Payer: Medicare Other

## 2014-09-25 ENCOUNTER — Telehealth: Payer: Self-pay | Admitting: Hematology and Oncology

## 2014-09-25 VITALS — BP 155/56 | HR 67 | Temp 98.5°F | Resp 19 | Ht 63.0 in | Wt 241.0 lb

## 2014-09-25 DIAGNOSIS — D61818 Other pancytopenia: Secondary | ICD-10-CM

## 2014-09-25 DIAGNOSIS — C9112 Chronic lymphocytic leukemia of B-cell type in relapse: Secondary | ICD-10-CM

## 2014-09-25 DIAGNOSIS — C50912 Malignant neoplasm of unspecified site of left female breast: Secondary | ICD-10-CM

## 2014-09-25 DIAGNOSIS — C9192 Lymphoid leukemia, unspecified, in relapse: Secondary | ICD-10-CM

## 2014-09-25 DIAGNOSIS — D649 Anemia, unspecified: Secondary | ICD-10-CM

## 2014-09-25 DIAGNOSIS — Z95828 Presence of other vascular implants and grafts: Secondary | ICD-10-CM

## 2014-09-25 DIAGNOSIS — Z452 Encounter for adjustment and management of vascular access device: Secondary | ICD-10-CM

## 2014-09-25 LAB — CBC WITH DIFFERENTIAL/PLATELET
BASO%: 0 % (ref 0.0–2.0)
Basophils Absolute: 0 10*3/uL (ref 0.0–0.1)
EOS%: 4.7 % (ref 0.0–7.0)
Eosinophils Absolute: 0.4 10*3/uL (ref 0.0–0.5)
HCT: 23.9 % — ABNORMAL LOW (ref 34.8–46.6)
HGB: 7.8 g/dL — ABNORMAL LOW (ref 11.6–15.9)
LYMPH%: 83.3 % — ABNORMAL HIGH (ref 14.0–49.7)
MCH: 39.7 pg — AB (ref 25.1–34.0)
MCHC: 32.8 g/dL (ref 31.5–36.0)
MCV: 121.3 fL — ABNORMAL HIGH (ref 79.5–101.0)
MONO#: 0.6 10*3/uL (ref 0.1–0.9)
MONO%: 7.7 % (ref 0.0–14.0)
NEUT#: 0.3 10*3/uL — CL (ref 1.5–6.5)
NEUT%: 4.3 % — AB (ref 38.4–76.8)
Platelets: 44 10*3/uL — ABNORMAL LOW (ref 145–400)
RBC: 1.97 10*6/uL — ABNORMAL LOW (ref 3.70–5.45)
RDW: 16.5 % — AB (ref 11.2–14.5)
WBC: 8.1 10*3/uL (ref 3.9–10.3)
lymph#: 6.7 10*3/uL — ABNORMAL HIGH (ref 0.9–3.3)
nRBC: 0 % (ref 0–0)

## 2014-09-25 LAB — COMPREHENSIVE METABOLIC PANEL (CC13)
ALT: 33 U/L (ref 0–55)
ANION GAP: 7 meq/L (ref 3–11)
AST: 33 U/L (ref 5–34)
Albumin: 3.7 g/dL (ref 3.5–5.0)
Alkaline Phosphatase: 62 U/L (ref 40–150)
BILIRUBIN TOTAL: 0.54 mg/dL (ref 0.20–1.20)
BUN: 22.2 mg/dL (ref 7.0–26.0)
CO2: 26 mEq/L (ref 22–29)
CREATININE: 1.4 mg/dL — AB (ref 0.6–1.1)
Calcium: 9.3 mg/dL (ref 8.4–10.4)
Chloride: 107 mEq/L (ref 98–109)
EGFR: 40 mL/min/{1.73_m2} — AB (ref >90)
GLUCOSE: 80 mg/dL (ref 70–140)
Potassium: 4.6 mEq/L (ref 3.5–5.1)
Sodium: 140 mEq/L (ref 136–145)
TOTAL PROTEIN: 5.7 g/dL — AB (ref 6.4–8.3)

## 2014-09-25 LAB — LACTATE DEHYDROGENASE (CC13): LDH: 515 U/L — ABNORMAL HIGH (ref 125–245)

## 2014-09-25 LAB — PREPARE RBC (CROSSMATCH)

## 2014-09-25 LAB — TECHNOLOGIST REVIEW

## 2014-09-25 MED ORDER — HEPARIN SOD (PORK) LOCK FLUSH 100 UNIT/ML IV SOLN
500.0000 [IU] | Freq: Once | INTRAVENOUS | Status: AC
  Administered 2014-09-25: 500 [IU] via INTRAVENOUS
  Filled 2014-09-25: qty 5

## 2014-09-25 MED ORDER — SODIUM CHLORIDE 0.9 % IJ SOLN
10.0000 mL | INTRAMUSCULAR | Status: DC | PRN
  Administered 2014-09-25: 10 mL via INTRAVENOUS
  Filled 2014-09-25: qty 10

## 2014-09-25 NOTE — Patient Instructions (Signed)

## 2014-09-25 NOTE — Progress Notes (Signed)
  [place] OFFICE PROGRESS NOTE   Diagnosis: CLL, pancytopenia  INTERVAL HISTORY:   [name] returns as scheduled. She complains of malaise. She has sweats during the day and at night. She reports intermittent leg and hand cramps. She has noted a lymph node in the right axilla. [name] underwent a bone marrow biopsy on 09/06/2014. She reports tolerating the procedure well. The pathology ([id number]) confirmed a hypercellular marrow with involvement by chronic leukocytic leukemia. Flow cytometry confirmed a B-cell population expressing CD20, CD23, and CD5. The cytogenetics returned with a complex karyotype. The first cell line was chromosomally normal representing 10% of cells. 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  Objective:  Vital signs in last 24 hours:  Blood pressure 155/56, pulse 67, temperature 98.5 F (36.9 C), temperature source Oral, resp. rate 19, height 5' 3" (1.6 m), weight 241 lb (109.317 kg), SpO2 100 %.    HEENT: Neck without mass Lymphatics: No cervical, supraclavicular, or inguinal nodes. Soft 3-4 cm medial bilateral axillary nodes near the pectoral muscle Resp: Lungs clear bilaterally Cardio: Regular rate and rhythm GI: No hepatosplenomegaly Vascular: No leg edema  Breasts: Status post left mastectomy. No evidence for chest wall tumor recurrence.  Portacath/PICC-without erythema  Lab Results:  Lab Results  Component Value Date   WBC 8.1 09/25/2014   HGB 7.8* 09/25/2014   HCT 23.9* 09/25/2014   MCV 121.3* 09/25/2014   PLT 44* 09/25/2014   NEUTROABS 0.3* 09/25/2014    Medications: I have reviewed the patient's current medications.  Assessment/Plan: 1. Relapsed CLL/well-differentiated lymphocytic lymphoma. She began a trial of Ibrutinib 05/27/2013. It was stopped on 06/10/2013 due to profound fatigue, nausea and anorexia. In addition, labs on 06/10/2013 showed significant deterioration in  blood counts with the platelet count down to 3000, felt to be secondary to disease progression as this was how she initially presented. Treatment was initiated with Obinutuzumab and Leukeran on 06/23/2013. Blood counts improved. Obinutuzumab discontinued following cycle 3 due to development of chest tightness requiring hospitalization. She ruled out for an MI. Low dose daily chlorambucil continued until 01/09/2014 when it was placed on hold due to progressive neutropenia/thrombocytopenia.  Bone marrow biopsy 09/06/2014 confirmed involvement with CLL 2. Extreme fatigue, anorexia while on Ibrutinib. 3. Multifocal invasive and noninvasive cancers left breast status post mastectomy 10/09/2010 (mpT2,mpN0) currently on tamoxifen. 4. Hypertension. 5. Recurrent atypical neurologic symptoms with dizziness, vertigo, paresthesias. 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 Mild changes in small vessels. No acute changes. Aspirin placed on hold due to severe thrombocytopenia. 6. Pancytopenia secondary to CLL involving the bone marrow  Disposition:  [name] has advanced CLL. She is symptomatic with severe anemia. She will be scheduled for a red cell transfusion 09/26/2014. [name] has been treated with multiple systemic therapies for CLL. I will review her treatment history and discussed the case with Dr. Beryle Beams. She will return for an office visit in one week. She knows to contact us for bleeding or a fever. The plan is to begin salvage systemic therapy within the next one to 2 weeks.  Treatment options may include idelalisib and another trial of ibrutinib. She may also be a candidate for bendamustine/rituximab therapy. Betsy Coder, MD  09/25/2014  12:34 PM

## 2014-09-26 ENCOUNTER — Ambulatory Visit (HOSPITAL_COMMUNITY)
Admission: RE | Admit: 2014-09-26 | Discharge: 2014-09-26 | Disposition: A | Discharge status: Home / Self Care | Payer: Medicare Other | Source: Ambulatory Visit | Attending: Oncology | Admitting: Oncology

## 2014-09-26 VITALS — BP 156/72 | HR 64 | Temp 97.7°F | Resp 18

## 2014-09-26 DIAGNOSIS — D649 Anemia, unspecified: Secondary | ICD-10-CM | POA: Diagnosis not present

## 2014-09-26 LAB — HOLD TUBE, BLOOD BANK

## 2014-09-26 MED ORDER — HEPARIN SOD (PORK) LOCK FLUSH 100 UNIT/ML IV SOLN
500.0000 [IU] | Freq: Every day | INTRAVENOUS | Status: AC | PRN
  Administered 2014-09-26: 500 [IU]
  Filled 2014-09-26: qty 5

## 2014-09-26 MED ORDER — SODIUM CHLORIDE 0.9 % IV SOLN: 250.0000 mL | Freq: Once | INTRAVENOUS | Status: DC

## 2014-09-26 MED ORDER — SODIUM CHLORIDE 0.9 % IJ SOLN
10.0000 mL | INTRAMUSCULAR | Status: AC | PRN
  Administered 2014-09-26: 10 mL

## 2014-09-26 NOTE — Procedures (Signed)
[facility]  Procedure Note  [name] IZX:[id number] DOB: 08/01/1934 DOA: 09/26/2014   Dr. Benay Spice  Associated Diagnosis: Anemia, unspecified anemia type  Procedure Note: port accessed, 2units PRBC's transfused per order, port flushed and de-accessed   Condition During Procedure: patient stable throughout procedure   Condition at [place] complaints, family at bedside for discharge   Roberto Scales, [facility]

## 2014-09-27 LAB — TYPE AND SCREEN
ABO/RH(D): A POS
Antibody Screen: NEGATIVE
UNIT DIVISION: 0
Unit division: 0

## 2014-09-28 ENCOUNTER — Encounter (HOSPITAL_COMMUNITY): Payer: Self-pay

## 2014-10-01 ENCOUNTER — Other Ambulatory Visit: Payer: Self-pay | Admitting: Oncology

## 2014-10-04 ENCOUNTER — Encounter: Payer: Self-pay | Admitting: Oncology

## 2014-10-04 ENCOUNTER — Telehealth: Payer: Self-pay | Admitting: Oncology

## 2014-10-04 ENCOUNTER — Ambulatory Visit (HOSPITAL_BASED_OUTPATIENT_CLINIC_OR_DEPARTMENT_OTHER): Payer: Medicare Other | Admitting: Nurse Practitioner

## 2014-10-04 ENCOUNTER — Other Ambulatory Visit (HOSPITAL_BASED_OUTPATIENT_CLINIC_OR_DEPARTMENT_OTHER): Payer: Medicare Other

## 2014-10-04 VITALS — BP 184/51 | HR 61 | Temp 97.6°F | Resp 18 | Ht 63.0 in | Wt 241.0 lb

## 2014-10-04 DIAGNOSIS — C9112 Chronic lymphocytic leukemia of B-cell type in relapse: Secondary | ICD-10-CM

## 2014-10-04 DIAGNOSIS — C9192 Lymphoid leukemia, unspecified, in relapse: Secondary | ICD-10-CM

## 2014-10-04 DIAGNOSIS — I10 Essential (primary) hypertension: Secondary | ICD-10-CM

## 2014-10-04 LAB — CBC WITH DIFFERENTIAL/PLATELET
BASO%: 0.5 % (ref 0.0–2.0)
BASOS ABS: 0.1 10*3/uL (ref 0.0–0.1)
EOS%: 1 % (ref 0.0–7.0)
Eosinophils Absolute: 0.1 10*3/uL (ref 0.0–0.5)
HCT: 34.6 % — ABNORMAL LOW (ref 34.8–46.6)
HEMOGLOBIN: 11.2 g/dL — AB (ref 11.6–15.9)
LYMPH%: 71.3 % — AB (ref 14.0–49.7)
MCH: 36.3 pg — ABNORMAL HIGH (ref 25.1–34.0)
MCHC: 32.4 g/dL (ref 31.5–36.0)
MCV: 112 fL — AB (ref 79.5–101.0)
MONO#: 1.1 10*3/uL — AB (ref 0.1–0.9)
MONO%: 7.4 % (ref 0.0–14.0)
NEUT%: 19.8 % — ABNORMAL LOW (ref 38.4–76.8)
NEUTROS ABS: 2.9 10*3/uL (ref 1.5–6.5)
Platelets: 48 10*3/uL — ABNORMAL LOW (ref 145–400)
RBC: 3.08 10*6/uL — ABNORMAL LOW (ref 3.70–5.45)
RDW: 23.4 % — AB (ref 11.2–14.5)
WBC: 14.7 10*3/uL — AB (ref 3.9–10.3)
lymph#: 10.5 10*3/uL — ABNORMAL HIGH (ref 0.9–3.3)

## 2014-10-04 LAB — HOLD TUBE, BLOOD BANK

## 2014-10-04 LAB — COMPREHENSIVE METABOLIC PANEL (CC13)
ALBUMIN: 4.3 g/dL (ref 3.5–5.0)
ALT: 43 U/L (ref 0–55)
ANION GAP: 8 meq/L (ref 3–11)
AST: 47 U/L — ABNORMAL HIGH (ref 5–34)
Alkaline Phosphatase: 82 U/L (ref 40–150)
BILIRUBIN TOTAL: 0.89 mg/dL (ref 0.20–1.20)
BUN: 19.2 mg/dL (ref 7.0–26.0)
CHLORIDE: 107 meq/L (ref 98–109)
CO2: 25 meq/L (ref 22–29)
CREATININE: 1.4 mg/dL — AB (ref 0.6–1.1)
Calcium: 9.6 mg/dL (ref 8.4–10.4)
EGFR: 41 mL/min/{1.73_m2} — ABNORMAL LOW (ref >90)
Glucose: 96 mg/dl (ref 70–140)
POTASSIUM: 4.4 meq/L (ref 3.5–5.1)
Sodium: 141 mEq/L (ref 136–145)
TOTAL PROTEIN: 6.5 g/dL (ref 6.4–8.3)

## 2014-10-04 LAB — LACTATE DEHYDROGENASE (CC13): LDH: 692 U/L — ABNORMAL HIGH (ref 125–245)

## 2014-10-04 LAB — TECHNOLOGIST REVIEW

## 2014-10-04 NOTE — Progress Notes (Signed)
Pt daughter requesting FMLA papers be filled out, paperwork brought to [facility] with pts daughters contact information to pick up when ready. Cell is [phone], alternate phone number [phone].

## 2014-10-04 NOTE — Patient Instructions (Signed)
Idelalisib tablets What is this medicine? IDELALISIB (eye del AL isib) is a chemotherapy drug. It targets proteins in cancer cells and stops the cancer cells from growing. This medicine may be used for other purposes; ask your health care provider or pharmacist if you have questions. COMMON BRAND NAME(S): ZYDELIG What should I tell my health care provider before I take this medicine? They need to know if you have any of these conditions: -infection -inflammatory bowel disease -liver disease -low blood count, like low white cell, platelet, or red cell counts -lung or breathing disease, like asthma -stomach or intestine problems -an unusual or allergic reaction to idelalisib, other medicines, foods, dyes, or preservative -pregnant, or trying to get pregnant -breast-feeding How should I use this medicine? Take this medicine by mouth with a glass of water. Follow the directions on the prescription label. Do not cut, crush or chew this medicine. Take your medicine at regular intervals. Do not take it more often than directed. Do not stop taking except on your doctor's advice. Talk to your pediatrician regarding the use of this medicine in children. Special care may be needed. Overdosage: If you think you've taken too much of this medicine contact a poison control center or emergency room at once. Overdosage: If you think you have taken too much of this medicine contact a poison control center or emergency room at once. NOTE: This medicine is only for you. Do not share this medicine with others. What if I miss a dose? If you miss a dose, take it as soon as you can. If it is almost time for your next dose, take only that dose. Do not take double or extra doses. What may interact with this medicine? Do not take this medicine with any of the following  medications: -atorvastatin -carbamazepine -cerivastatin -cisapride -dasatinib -dihydroergotamine -doxorubicin -dronedarone -eletriptan -enzalutamide -ergotamine -fosphenytoin -halofantrine -ketoconazole -lovastatin -midazolam -nefazodone -nelfinavir -nifedipine -phenobarbital -phenytoin -pimozide -primidone -propafenone -quetiapine -quinidine -ranolazine -rifabutin -rifampin -rilpivirine -risperidone -ritonavir -sildosin -simvastatin -sirolimus -St. John's Wort, Hypericum perforatum -vinblastine -ziprasidone This medicine may also interact with the following medications: -certain medicines for blood pressure, heart disease, irregular heart beat -certain medicines for blood pressure like amlodipine, felodipine, nifedipine -certain medicines for cholesterol like atorvastatin, lovastatin, and simvastatin -certain medicines for depression, anxiety, or psychotic disturbances -certain medicines for erectile dysfunction -certain medicines that treat or prevent blood clots like warfarin, enoxaparin, dalteparin, apixaban, dabigatran, and rivaroxaban -ergot alkaloids like dihydroergotamine, ergotamine -narcotic medicines for pain This list may not describe all possible interactions. Give your health care provider a list of all the medicines, herbs, non-prescription drugs, or dietary supplements you use. Also tell them if you smoke, drink alcohol, or use illegal drugs. Some items may interact with your medicine. What should I watch for while using this medicine? This drug may make you feel generally unwell. This is not uncommon, as chemotherapy can affect healthy cells as well as cancer cells. Report any side effects. Continue your course of treatment even though you feel ill unless your doctor tells you to stop. Men and women should use effective birth control while taking this medicine. Do not become pregnant while taking this medicine. Women should inform their doctor if  they wish to become pregnant or think they might be pregnant. There is a potential for serious side effects to an unborn child. Talk to your health care professional or pharmacist for more information. Do not breast-feed an infant while taking this medicine. This medicine may increase your risk to  bruise or bleed. Call your doctor or health care professional if you notice any unusual bleeding. If you are going to have surgery or any other procedures, tell your doctor you are taking this medicine. Call your doctor or health care professional for advice if you get a fever, chills or sore throat, or other symptoms of a cold or flu. Do not treat yourself. This drug decreases your body's ability to fight infections. Try to avoid being around people who are sick. You may need blood work done while you are taking this medicine. What side effects may I notice from receiving this medicine? Side effects that you should report to your doctor or health care professional as soon as possible: -allergic reactions like skin rash, itching or hives, swelling of the face, lips, or tongue -breathing problems -diarrhea -low blood counts - this medicine may decrease the number of white blood cells, red blood cells and platelets. You may be at increased risk for infections and bleeding -nausea, vomiting -redness, blistering, peeling or loosening of the skin, including inside the mouth -signs and symptoms of bleeding such as bloody or black, tarry stools; red or dark-brown urine; spitting up blood or brown material that looks like coffee grounds; red spots on the skin; unusual bruising or bleeding from the eye, gums, or nose -signs of infection - fever or chills, cough, sore throat, pain or difficulty passing urine -signs and symptoms of liver injury like dark yellow or brown urine; general ill feeling or flu-like symptoms; light-colored stools; loss of appetite; nausea; right upper belly pain; unusually weak or tired;  yellowing of the eyes or skin -stomach pain Side effects that usually do not require medical attention (Report these to your doctor or health care professional if they continue or are bothersome.): -cough -headache -signs and symptoms of high blood sugar such as dizziness; dry mouth; dry skin; fruity breath; nausea; stomach pain; increased hunger or thirst; increased urination -signs and symptoms of low blood sugar such as feeling anxious; confusion; dizziness; increased hunger; unusually weak or tired; sweating; shakiness; cold; irritable; headache; blurred vision; fast heartbeat; loss of consciousness -weak or tired This list may not describe all possible side effects. Call your doctor for medical advice about side effects. You may report side effects to FDA at 1-800-FDA-1088. Where should I keep my medicine? Keep out of the reach of children. Store between 20 and 30 degrees C (68 and 86 degrees F). Keep this medicine in the original container. Throw away any unused medicine after the expiration date. NOTE: This sheet is a summary. It may not cover all possible information. If you have questions about this medicine, talk to your doctor, pharmacist, or health care provider.  2015, [employer]vier/Gold Standard. (2013-05-25 09:07:18)

## 2014-10-04 NOTE — Telephone Encounter (Signed)
gv adn printed appt sched anda vs for pt for Feb °

## 2014-10-04 NOTE — Telephone Encounter (Signed)
gv adn printed appt sched and avs for pt for Jan and Feb 2016

## 2014-10-04 NOTE — Progress Notes (Addendum)
[place] OFFICE PROGRESS NOTE   Diagnosis:  CLL, pancytopenia  INTERVAL HISTORY:   [name] returns as scheduled. Energy level continues to be poor. She has a persistent nonproductive cough. The cough seems to be slowly improving. She denies shortness of breath. No fever. No chest pain.   Objective:  Vital signs in last 24 hours:  Blood pressure 184/51, pulse 61, temperature 97.6 F (36.4 C), temperature source Oral, resp. rate 18, height 5' 3"  (1.6 m), weight 241 lb (109.317 kg), SpO2 100 %.    HEENT: No thrush or ulcers. Lymphatics: Question small bilateral posterior cervical lymph nodes. No supra clavicular lymph nodes. 3-4 cm bilateral axillary lymph nodes. Resp: Lungs clear bilaterally. Cardio: Regular rate and rhythm. GI: Abdomen soft and nontender. No hepatomegaly. Vascular: No leg edema. MSK: Tenderness over bilateral anterior iliac regions.  Lab Results:  Lab Results  Component Value Date   WBC 14.7* 10/04/2014   HGB 11.2* 10/04/2014   HCT 34.6* 10/04/2014   MCV 112.0* 10/04/2014   PLT 48* 10/04/2014   NEUTROABS 2.9 10/04/2014    Imaging:  No results found.  Medications: I have reviewed the patient's current medications.  Assessment/Plan: 1. Relapsed CLL/well-differentiated lymphocytic lymphoma. She began a trial of Ibrutinib 05/27/2013. It was stopped on 06/10/2013 due to profound fatigue, nausea and anorexia. In addition, labs on 06/10/2013 showed significant deterioration in blood counts with the platelet count down to 3000, felt to be secondary to disease progression as this was how she initially presented. Treatment was initiated with Obinutuzumab and Leukeran on 06/23/2013. Blood counts improved. Obinutuzumab discontinued following cycle 3 due to development of chest tightness requiring hospitalization. She ruled out for an MI. Low dose daily chlorambucil continued until 01/09/2014 when it was placed on hold due to progressive  neutropenia/thrombocytopenia.  Bone marrow biopsy 09/06/2014 confirmed involvement with CLL 2. Extreme fatigue, anorexia while on Ibrutinib. 3. Multifocal invasive and noninvasive cancers left breast status post mastectomy 10/09/2010 (mpT2,mpN0) currently on tamoxifen. 4. Hypertension. 5. Recurrent atypical neurologic symptoms with dizziness, vertigo, paresthesias. 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 Mild changes in small vessels. No acute changes. Aspirin placed on hold due to severe thrombocytopenia. 6. Pancytopenia secondary to CLL involving the bone marrow.   Disposition: [name] appears unchanged. Her hemoglobin is better following a recent blood transfusion. The platelet count is stable. The white count/neutrophil count is higher, unclear etiology.  Dr. Benay Spice recommends treatment with Idelalisib plus or minus rituximab. We reviewed potential toxicities associated with Idelalisib including myelosuppression, pneumonitis, hepatotoxicity, diarrhea, rash.  [name] is encouraged that her white count is better and declines to begin treatment at this time. She is agreeable to a follow-up CBC in 2 weeks. We will see her in follow-up in 4 weeks. She will contact the office in the interim with any problems. We specifically discussed fever, chills, bleeding.  Patient seen with Dr. Benay Spice. 25 minutes were spent face-to-face at today's visit with the majority of that time involved in counseling/coordination of care.  Ned Card ANP/GNP-BC   10/04/2014  2:58 PM  This was ensured visit with Ned Card. The hemoglobin has improved following a red cell transfusion. The neutrophil count is better today. We cannot explain the elevated neutrophil count today. I recommend proceeding with salvage therapy for CLL. We specifically recommended idelalissib and rituximab. We discussed the potential toxicities associated with this regimen.  [name] declines  systemic therapy at present. She agrees  to a follow-up visit in 4 weeks.  [name], M.D.

## 2014-10-04 NOTE — Progress Notes (Signed)
Put daughter's fmla form on nurse's desk. °

## 2014-10-06 ENCOUNTER — Encounter: Payer: Self-pay | Admitting: Oncology

## 2014-10-06 ENCOUNTER — Other Ambulatory Visit: Payer: Self-pay | Admitting: Internal Medicine

## 2014-10-06 NOTE — Progress Notes (Signed)
Put daughter's fmla form in registration desk. °

## 2014-10-17 ENCOUNTER — Telehealth: Payer: Self-pay | Admitting: *Deleted

## 2014-10-17 ENCOUNTER — Other Ambulatory Visit (HOSPITAL_BASED_OUTPATIENT_CLINIC_OR_DEPARTMENT_OTHER): Payer: Medicare Other

## 2014-10-17 DIAGNOSIS — C9112 Chronic lymphocytic leukemia of B-cell type in relapse: Secondary | ICD-10-CM

## 2014-10-17 DIAGNOSIS — C9192 Lymphoid leukemia, unspecified, in relapse: Secondary | ICD-10-CM

## 2014-10-17 LAB — MANUAL DIFFERENTIAL
ALC: 12.6 10*3/uL — ABNORMAL HIGH (ref 0.9–3.3)
ANC (CHCC MAN DIFF): 0.7 10*3/uL — AB (ref 1.5–6.5)
Band Neutrophils: 0 % (ref 0–10)
Basophil: 0 % (ref 0–2)
Blasts: 0 % (ref 0–0)
EOS: 1 % (ref 0–7)
LYMPH: 92 % — ABNORMAL HIGH (ref 14–49)
METAMYELOCYTES PCT: 0 % (ref 0–0)
MONO: 2 % (ref 0–14)
MYELOCYTES: 0 % (ref 0–0)
Other Cell: 0 % (ref 0–0)
PLT EST: DECREASED
PROMYELO: 0 % (ref 0–0)
SEG: 5 % — ABNORMAL LOW (ref 38–77)
VARIANT LYMPH: 0 % (ref 0–0)
nRBC: 1 % — ABNORMAL HIGH (ref 0–0)

## 2014-10-17 LAB — CBC WITH DIFFERENTIAL/PLATELET
HCT: 32 % — ABNORMAL LOW (ref 34.8–46.6)
HGB: 10.2 g/dL — ABNORMAL LOW (ref 11.6–15.9)
MCH: 36.2 pg — ABNORMAL HIGH (ref 25.1–34.0)
MCHC: 32 g/dL (ref 31.5–36.0)
MCV: 113.4 fL — AB (ref 79.5–101.0)
Platelets: 63 10*3/uL — ABNORMAL LOW (ref 145–400)
RBC: 2.82 10*6/uL — AB (ref 3.70–5.45)
RDW: 23 % — ABNORMAL HIGH (ref 11.2–14.5)
WBC: 13.7 10*3/uL — ABNORMAL HIGH (ref 3.9–10.3)

## 2014-10-17 LAB — HOLD TUBE, BLOOD BANK

## 2014-10-17 NOTE — Telephone Encounter (Signed)
Reviewed CBC results with patient/daughter. Look stable; no need for transfusion. Follow up on 2/2 as scheduled per Dr. Benay Spice.

## 2014-10-24 ENCOUNTER — Other Ambulatory Visit: Payer: Self-pay | Admitting: Nurse Practitioner

## 2014-10-31 ENCOUNTER — Telehealth: Payer: Self-pay | Admitting: Oncology

## 2014-10-31 ENCOUNTER — Ambulatory Visit (HOSPITAL_BASED_OUTPATIENT_CLINIC_OR_DEPARTMENT_OTHER): Payer: Medicare Other | Admitting: Oncology

## 2014-10-31 ENCOUNTER — Other Ambulatory Visit (HOSPITAL_BASED_OUTPATIENT_CLINIC_OR_DEPARTMENT_OTHER): Payer: Medicare Other

## 2014-10-31 VITALS — BP 166/55 | HR 90 | Temp 98.3°F | Resp 18 | Ht 63.0 in | Wt 230.3 lb

## 2014-10-31 DIAGNOSIS — R61 Generalized hyperhidrosis: Secondary | ICD-10-CM

## 2014-10-31 DIAGNOSIS — C9112 Chronic lymphocytic leukemia of B-cell type in relapse: Secondary | ICD-10-CM

## 2014-10-31 DIAGNOSIS — C9192 Lymphoid leukemia, unspecified, in relapse: Secondary | ICD-10-CM

## 2014-10-31 DIAGNOSIS — D61818 Other pancytopenia: Secondary | ICD-10-CM

## 2014-10-31 LAB — HOLD TUBE, BLOOD BANK

## 2014-10-31 LAB — CBC WITH DIFFERENTIAL/PLATELET
BASO%: 1 % (ref 0.0–2.0)
BASOS ABS: 0.1 10*3/uL (ref 0.0–0.1)
EOS ABS: 0.1 10*3/uL (ref 0.0–0.5)
EOS%: 0.6 % (ref 0.0–7.0)
HCT: 27.5 % — ABNORMAL LOW (ref 34.8–46.6)
HEMOGLOBIN: 8.9 g/dL — AB (ref 11.6–15.9)
LYMPH%: 88.6 % — ABNORMAL HIGH (ref 14.0–49.7)
MCH: 37.4 pg — ABNORMAL HIGH (ref 25.1–34.0)
MCHC: 32.4 g/dL (ref 31.5–36.0)
MCV: 115.5 fL — ABNORMAL HIGH (ref 79.5–101.0)
MONO#: 0.8 10*3/uL (ref 0.1–0.9)
MONO%: 5.6 % (ref 0.0–14.0)
NEUT%: 4.2 % — ABNORMAL LOW (ref 38.4–76.8)
NEUTROS ABS: 0.6 10*3/uL — AB (ref 1.5–6.5)
PLATELETS: 56 10*3/uL — AB (ref 145–400)
RBC: 2.38 10*6/uL — AB (ref 3.70–5.45)
RDW: 24.1 % — AB (ref 11.2–14.5)
WBC: 13.8 10*3/uL — ABNORMAL HIGH (ref 3.9–10.3)
lymph#: 12.3 10*3/uL — ABNORMAL HIGH (ref 0.9–3.3)

## 2014-10-31 LAB — COMPREHENSIVE METABOLIC PANEL (CC13)
ALK PHOS: 70 U/L (ref 40–150)
ALT: 36 U/L (ref 0–55)
AST: 44 U/L — AB (ref 5–34)
Albumin: 3.8 g/dL (ref 3.5–5.0)
Anion Gap: 12 mEq/L — ABNORMAL HIGH (ref 3–11)
BUN: 15.1 mg/dL (ref 7.0–26.0)
CALCIUM: 8.9 mg/dL (ref 8.4–10.4)
CO2: 23 meq/L (ref 22–29)
Chloride: 107 mEq/L (ref 98–109)
Creatinine: 1.4 mg/dL — ABNORMAL HIGH (ref 0.6–1.1)
EGFR: 40 mL/min/{1.73_m2} — AB (ref >90)
GLUCOSE: 96 mg/dL (ref 70–140)
POTASSIUM: 4.3 meq/L (ref 3.5–5.1)
Sodium: 141 mEq/L (ref 136–145)
TOTAL PROTEIN: 6.1 g/dL — AB (ref 6.4–8.3)
Total Bilirubin: 0.7 mg/dL (ref 0.20–1.20)

## 2014-10-31 LAB — LACTATE DEHYDROGENASE (CC13): LDH: 549 U/L — ABNORMAL HIGH (ref 125–245)

## 2014-10-31 LAB — TECHNOLOGIST REVIEW

## 2014-10-31 NOTE — Telephone Encounter (Signed)
Pt confirmed labs/ov per 02/02 POF, gave pt AVS.... KJ °

## 2014-10-31 NOTE — Progress Notes (Signed)
  [place] OFFICE PROGRESS NOTE   Diagnosis: CLL  INTERVAL HISTORY:   She returns as scheduled. She has night sweats and anorexia. She reports malaise. No bleeding.  Objective:  Vital signs in last 24 hours:  Blood pressure 166/55, pulse 90, temperature 98.3 F (36.8 C), temperature source Oral, resp. rate 18, height $RemoveBe'5\' 3"'jxllxUBtm$  (1.6 m), weight 230 lb 4.8 oz (104.463 kg), SpO2 100 %.    HEENT: No thrush or ulcers Lymphatics: Bilateral axillary nodes with a large conglomerate of nodes measuring 3-4 cm in the left axilla greater than right axilla. Smaller bilateral inguinal nodes. Resp: Lungs clear bilaterally Cardio: Regular rate and rhythm GI: No hepatomegaly, nontender Vascular: Trace low leg edema bilaterally  Portacath/PICC-without erythema  Lab Results:  Lab Results  Component Value Date   WBC 13.8* 10/31/2014   HGB 8.9* 10/31/2014   HCT 27.5* 10/31/2014   MCV 115.5* 10/31/2014   PLT 56* 10/31/2014   NEUTROABS 0.6* 10/31/2014     Medications: I have reviewed the patient's current medications.  Assessment/Plan: 1. Relapsed CLL/well-differentiated lymphocytic lymphoma. She began a trial of Ibrutinib 05/27/2013. It was stopped on 06/10/2013 due to profound fatigue, nausea and anorexia. In addition, labs on 06/10/2013 showed significant deterioration in blood counts with the platelet count down to 3000, felt to be secondary to disease progression as this was how she initially presented. Treatment was initiated with Obinutuzumab and Leukeran on 06/23/2013. Blood counts improved. Obinutuzumab discontinued following cycle 3 due to development of chest tightness requiring hospitalization. She ruled out for an MI. Low dose daily chlorambucil continued until 01/09/2014 when it was placed on hold due to progressive neutropenia/thrombocytopenia.  Bone marrow biopsy 09/06/2014 confirmed involvement with CLL 2. Extreme fatigue, anorexia while on  Ibrutinib. 3. Multifocal invasive and noninvasive cancers left breast status post mastectomy 10/09/2010 (mpT2,mpN0) currently on tamoxifen. 4. Hypertension. 5. Recurrent atypical neurologic symptoms with dizziness, vertigo, paresthesias. 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 Mild changes in small vessels. No acute changes. Aspirin placed on hold due to severe thrombocytopenia. 6. Pancytopenia secondary to CLL involving the bone marrow.    Disposition:  [name] has advanced CLL. She has persistent pancytopenia, anorexia, and night sweats. I recommended she begin treatment with Idelalisib. She declined systemic therapy at present. She will return for an office visit, CBC, and Port-A-Cath flush in 3 weeks.  She will try taking ibuprofen or naproxen in the evening to alleviate the night sweats.  Betsy Coder, MD  10/31/2014  4:18 PM

## 2014-11-01 ENCOUNTER — Other Ambulatory Visit: Payer: Self-pay | Admitting: *Deleted

## 2014-11-01 MED ORDER — IDELALISIB 150 MG PO TABS: 150.0000 mg | ORAL_TABLET | Freq: Two times a day (BID) | ORAL | Status: DC

## 2014-11-02 ENCOUNTER — Telehealth: Payer: Self-pay | Admitting: *Deleted

## 2014-11-02 ENCOUNTER — Other Ambulatory Visit: Payer: Self-pay | Admitting: Internal Medicine

## 2014-11-02 NOTE — Telephone Encounter (Signed)
Patient called thinking she needs a refill on her zovirax.  Her PCP ordered in 12/15 with 4 refills.  If there is a problem with this she will need to contact her PCP, which she will.

## 2014-11-10 ENCOUNTER — Telehealth: Payer: Self-pay | Admitting: Nutrition

## 2014-11-10 NOTE — Telephone Encounter (Signed)
Spoke with patient's daughter who cares for patient. Per patient's daughter.  Patient is trying to lose weight to help her blood pressure. She is eating well but does have a decreased appetite. Educated patient's daughter to be sure patient was consuming protein at meals and snacks to avoid loss of lean body mass. Patient's daughter able to teach back nutrition information.  Will contact me for further questions or concerns.

## 2014-11-21 ENCOUNTER — Ambulatory Visit (HOSPITAL_BASED_OUTPATIENT_CLINIC_OR_DEPARTMENT_OTHER): Payer: Medicare Other

## 2014-11-21 ENCOUNTER — Telehealth: Payer: Self-pay | Admitting: Nurse Practitioner

## 2014-11-21 ENCOUNTER — Ambulatory Visit: Payer: Medicare Other

## 2014-11-21 ENCOUNTER — Other Ambulatory Visit (HOSPITAL_BASED_OUTPATIENT_CLINIC_OR_DEPARTMENT_OTHER): Payer: Medicare Other

## 2014-11-21 ENCOUNTER — Ambulatory Visit (HOSPITAL_BASED_OUTPATIENT_CLINIC_OR_DEPARTMENT_OTHER): Payer: Medicare Other | Admitting: Nurse Practitioner

## 2014-11-21 ENCOUNTER — Telehealth: Payer: Self-pay | Admitting: *Deleted

## 2014-11-21 ENCOUNTER — Ambulatory Visit (HOSPITAL_COMMUNITY)
Admission: RE | Admit: 2014-11-21 | Discharge: 2014-11-21 | Disposition: A | Discharge status: Home / Self Care | Payer: Medicare Other | Source: Ambulatory Visit | Attending: Oncology | Admitting: Oncology

## 2014-11-21 ENCOUNTER — Other Ambulatory Visit: Payer: Self-pay | Admitting: Nurse Practitioner

## 2014-11-21 ENCOUNTER — Telehealth: Payer: Self-pay | Admitting: Oncology

## 2014-11-21 VITALS — BP 140/45 | HR 65 | Temp 98.6°F | Resp 18 | Ht 63.0 in | Wt 221.3 lb

## 2014-11-21 VITALS — BP 141/51 | HR 65 | Temp 97.6°F | Resp 18

## 2014-11-21 DIAGNOSIS — R630 Anorexia: Secondary | ICD-10-CM

## 2014-11-21 DIAGNOSIS — D61818 Other pancytopenia: Secondary | ICD-10-CM

## 2014-11-21 DIAGNOSIS — C50912 Malignant neoplasm of unspecified site of left female breast: Secondary | ICD-10-CM

## 2014-11-21 DIAGNOSIS — C9112 Chronic lymphocytic leukemia of B-cell type in relapse: Secondary | ICD-10-CM

## 2014-11-21 DIAGNOSIS — R634 Abnormal weight loss: Secondary | ICD-10-CM

## 2014-11-21 DIAGNOSIS — I10 Essential (primary) hypertension: Secondary | ICD-10-CM | POA: Insufficient documentation

## 2014-11-21 DIAGNOSIS — C9192 Lymphoid leukemia, unspecified, in relapse: Secondary | ICD-10-CM

## 2014-11-21 DIAGNOSIS — R599 Enlarged lymph nodes, unspecified: Secondary | ICD-10-CM | POA: Insufficient documentation

## 2014-11-21 DIAGNOSIS — R61 Generalized hyperhidrosis: Secondary | ICD-10-CM | POA: Insufficient documentation

## 2014-11-21 DIAGNOSIS — Z79810 Long term (current) use of selective estrogen receptor modulators (SERMs): Secondary | ICD-10-CM | POA: Insufficient documentation

## 2014-11-21 DIAGNOSIS — Z95828 Presence of other vascular implants and grafts: Secondary | ICD-10-CM

## 2014-11-21 LAB — CBC WITH DIFFERENTIAL/PLATELET
BASO%: 1.7 % (ref 0.0–2.0)
BASOS ABS: 0.2 10*3/uL — AB (ref 0.0–0.1)
EOS ABS: 0.1 10*3/uL (ref 0.0–0.5)
EOS%: 0.5 % (ref 0.0–7.0)
HCT: 21.3 % — ABNORMAL LOW (ref 34.8–46.6)
HEMOGLOBIN: 6.7 g/dL — AB (ref 11.6–15.9)
LYMPH#: 12.4 10*3/uL — AB (ref 0.9–3.3)
LYMPH%: 85.1 % — ABNORMAL HIGH (ref 14.0–49.7)
MCH: 36.9 pg — ABNORMAL HIGH (ref 25.1–34.0)
MCHC: 31.5 g/dL (ref 31.5–36.0)
MCV: 117.2 fL — AB (ref 79.5–101.0)
MONO#: 0.5 10*3/uL (ref 0.1–0.9)
MONO%: 3.7 % (ref 0.0–14.0)
NEUT#: 1.3 10*3/uL — ABNORMAL LOW (ref 1.5–6.5)
NEUT%: 9 % — ABNORMAL LOW (ref 38.4–76.8)
Platelets: 45 10*3/uL — ABNORMAL LOW (ref 145–400)
RBC: 1.81 10*6/uL — ABNORMAL LOW (ref 3.70–5.45)
RDW: 23.5 % — ABNORMAL HIGH (ref 11.2–14.5)
WBC: 14.6 10*3/uL — AB (ref 3.9–10.3)

## 2014-11-21 LAB — COMPREHENSIVE METABOLIC PANEL (CC13)
ALK PHOS: 75 U/L (ref 40–150)
ALT: 45 U/L (ref 0–55)
ANION GAP: 9 meq/L (ref 3–11)
AST: 49 U/L — ABNORMAL HIGH (ref 5–34)
Albumin: 3.6 g/dL (ref 3.5–5.0)
BILIRUBIN TOTAL: 0.65 mg/dL (ref 0.20–1.20)
BUN: 21.8 mg/dL (ref 7.0–26.0)
CO2: 23 meq/L (ref 22–29)
CREATININE: 1.7 mg/dL — AB (ref 0.6–1.1)
Calcium: 8.9 mg/dL (ref 8.4–10.4)
Chloride: 106 mEq/L (ref 98–109)
EGFR: 33 mL/min/{1.73_m2} — AB (ref >90)
GLUCOSE: 110 mg/dL (ref 70–140)
Potassium: 4.1 mEq/L (ref 3.5–5.1)
SODIUM: 139 meq/L (ref 136–145)
TOTAL PROTEIN: 5.7 g/dL — AB (ref 6.4–8.3)

## 2014-11-21 LAB — LACTATE DEHYDROGENASE (CC13): LDH: 437 U/L — AB (ref 125–245)

## 2014-11-21 LAB — HOLD TUBE, BLOOD BANK

## 2014-11-21 LAB — TECHNOLOGIST REVIEW

## 2014-11-21 LAB — PREPARE RBC (CROSSMATCH)

## 2014-11-21 MED ORDER — SODIUM CHLORIDE 0.9 % IJ SOLN
10.0000 mL | INTRAMUSCULAR | Status: DC | PRN
  Administered 2014-11-21: 10 mL via INTRAVENOUS
  Filled 2014-11-21: qty 10

## 2014-11-21 MED ORDER — SODIUM CHLORIDE 0.9 % IV SOLN
250.0000 mL | Freq: Once | INTRAVENOUS | Status: AC
  Administered 2014-11-21: 250 mL via INTRAVENOUS

## 2014-11-21 MED ORDER — HEPARIN SOD (PORK) LOCK FLUSH 100 UNIT/ML IV SOLN
500.0000 [IU] | Freq: Once | INTRAVENOUS | Status: AC
  Administered 2014-11-21: 500 [IU] via INTRAVENOUS
  Filled 2014-11-21: qty 5

## 2014-11-21 MED ORDER — SODIUM CHLORIDE 0.9 % IJ SOLN
10.0000 mL | INTRAMUSCULAR | Status: AC | PRN
  Administered 2014-11-21: 10 mL
  Filled 2014-11-21: qty 10

## 2014-11-21 MED ORDER — HEPARIN SOD (PORK) LOCK FLUSH 100 UNIT/ML IV SOLN
500.0000 [IU] | Freq: Every day | INTRAVENOUS | Status: AC | PRN
  Administered 2014-11-21: 500 [IU]
  Filled 2014-11-21: qty 5

## 2014-11-21 NOTE — Telephone Encounter (Signed)
Pt confirmed labs/ov per 02/23 POF, gave pt AVS... KJ, sent msg to add blood for today, pt was sent back to chemo to add both units today if possible....Marland KitchenMarland Kitchen

## 2014-11-21 NOTE — Telephone Encounter (Signed)
CBC reviewed by Ned Card, NP. Pt will need blood transfusion. [facility] unable to accomodate pt for transfusion. [facility] has availability to transfuse 2 units on 11/22/14. Pt is symptomatic and will need at least one unit today. She will be worked in this afternoon in infusion.

## 2014-11-21 NOTE — Patient Instructions (Signed)

## 2014-11-21 NOTE — Telephone Encounter (Signed)
[facility] will call pt w/US appt per 2/23 pof. not other orders - appts for 2/29 already on schedule.

## 2014-11-21 NOTE — Telephone Encounter (Signed)
I called Ms. Gasper regarding the creatinine from today (1.7). Her daughter answered the phone. I made her aware that Dr. Benay Spice would like to obtain a renal ultrasound due to the increase in the creatinine and that our scheduling department would be giving her a call. Order entered and POF submitted.

## 2014-11-21 NOTE — Patient Instructions (Signed)

## 2014-11-21 NOTE — Progress Notes (Addendum)
[name]   Diagnosis:  CLL  INTERVAL HISTORY:   [name] returns as scheduled. She feels weak. Appetite is poor. She is losing weight. She has dyspnea on exertion. No fever. She continues to have sweats. No bleeding.  Objective:  Vital signs in last 24 hours:  Blood pressure 140/45, pulse 65, temperature 98.6 F (37 C), temperature source Oral, resp. rate 18, height 5' 3"  (1.6 m), weight 221 lb 4.8 oz (100.381 kg), SpO2 99 %.    HEENT: No thrush or ulcers.  Lymphatics: Large bilateral axillary nodes measuring 5-6 cm. 1 cm right low cervical node. Firmness at the right scalene/supraclavicular region. Bilateral inguinal nodes. Resp: Lungs clear bilaterally. Cardio: Regular rate and rhythm. GI: Abdomen soft and nontender. No hepatomegaly. No splenomegaly. Vascular: Trace low leg edema bilaterally. Port-A-Cath without erythema.  Lab Results:  Lab Results  Component Value Date   WBC 14.6* 11/21/2014   HGB 6.7* 11/21/2014   HCT 21.3* 11/21/2014   MCV 117.2* 11/21/2014   PLT 45* 11/21/2014   NEUTROABS 1.3* 11/21/2014    Imaging:  No results found.  Medications: I have reviewed the patient's current medications.  Assessment/Plan: 1. Relapsed CLL/well-differentiated lymphocytic lymphoma. Initial diagnosis dates to November 2009. Initially treated with modified fludarabine, Cytoxan and Rituxan with no response. No response to Campath. Partial response to Arzerra initially given between April and June 2010 and monthly through October 2010. In May 2013 there was deterioration of the blood counts. Arzerra was resumed and she again had a partial response. Blood counts began to fall again in August 2014. She began a trial of Ibrutinib 05/27/2013. It was stopped on 06/10/2013 due to profound fatigue, nausea and anorexia. In addition, labs on 06/10/2013 showed significant deterioration in blood counts with the platelet count down to 3000, felt to be  secondary to disease progression as this was how she initially presented. Treatment was initiated with Obinutuzumab and Leukeran on 06/23/2013. Blood counts improved. Obinutuzumab discontinued following cycle 3 due to development of chest tightness requiring hospitalization. She ruled out for an MI. Low dose daily chlorambucil continued until 01/09/2014 when it was placed on hold due to progressive neutropenia/thrombocytopenia.  Bone marrow biopsy 09/06/2014 confirmed involvement with CLL 2. Extreme fatigue, anorexia while on Ibrutinib. 3. Multifocal invasive and noninvasive cancers left breast status post mastectomy 10/09/2010 (mpT2,mpN0) currently on tamoxifen. 4. Hypertension. 5. Recurrent atypical neurologic symptoms with dizziness, vertigo, paresthesias. 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 Mild changes in small vessels. No acute changes. Aspirin placed on hold due to severe thrombocytopenia. 6. Pancytopenia secondary to CLL involving the bone marrow.   Disposition: [name] has persistent/progressive pancytopenia, anorexia/weight loss, night sweats and progressive peripheral adenopathy. She understands this is due to progression of the CLL. She is ready to proceed with systemic therapy. We discussed treatment with Idelalisib. We reviewed potential toxicities including severe diarrhea, hepatotoxicity, pneumonitis, bowel perforation, rash, myelosuppression. Dr. Benay Spice recommends Rituxan in addition to Idelalisib. We discussed the potential for tumor lysis syndrome and that she would likely be hospitalized with initiation of treatment. Dr. Benay Spice discussed a referral to Dr. Florene Glen at [facility] for a second opinion, clinical trial options. [name] would like to meet with Dr. Florene Glen prior to making a decision on proceeding as outlined above. We made a referral to Dr. Florene Glen at today's visit.  She has progressive anemia and is symptomatic. She will be  transfused 2 units of red  cells today.  [name] will return for a follow-up visit in approximately one week. She understands to contact the office in the interim with any problems. We specifically discussed fever, chills, other signs of infection, bleeding.  Patient seen with Dr. Benay Spice. 30 minutes were spent face-to-face at today's visit with the majority of that time involved in counseling/coordination of care.  Ned Card ANP/GNP-BC   11/21/2014  11:54 AM  This was a shared visit with Ned Card. [name] is symptomatic from anemia and progressive CLL. She now agrees to systemic therapy. I will refer her to [facility] to see if she is eligible for a clinical trial. Off protocol we will recommend Idelalisib/Rituxan.  We reviewed the potential toxicities associated with this regimen including the possibility of TLS.  [name], MD

## 2014-11-22 ENCOUNTER — Telehealth: Payer: Self-pay | Admitting: Oncology

## 2014-11-22 LAB — TYPE AND SCREEN
ABO/RH(D): A POS
ANTIBODY SCREEN: NEGATIVE
Unit division: 0
Unit division: 0

## 2014-11-22 NOTE — Telephone Encounter (Signed)
LEFT VM TO PT'S DTR IN REF TO [facility]. ON 12/13/14@12 :45 MEDICAL RECORDS FAXED SLIDES AND SCANS WILL BE FEDEX'ED

## 2014-11-24 ENCOUNTER — Ambulatory Visit (HOSPITAL_COMMUNITY)
Admission: RE | Admit: 2014-11-24 | Discharge: 2014-11-24 | Disposition: A | Discharge status: Home / Self Care | Payer: Medicare Other | Source: Ambulatory Visit | Attending: Nurse Practitioner | Admitting: Nurse Practitioner

## 2014-11-24 DIAGNOSIS — C9110 Chronic lymphocytic leukemia of B-cell type not having achieved remission: Secondary | ICD-10-CM | POA: Diagnosis present

## 2014-11-24 DIAGNOSIS — N1330 Unspecified hydronephrosis: Secondary | ICD-10-CM | POA: Insufficient documentation

## 2014-11-24 DIAGNOSIS — C9112 Chronic lymphocytic leukemia of B-cell type in relapse: Secondary | ICD-10-CM

## 2014-11-24 DIAGNOSIS — C9192 Lymphoid leukemia, unspecified, in relapse: Secondary | ICD-10-CM

## 2014-11-27 ENCOUNTER — Other Ambulatory Visit (HOSPITAL_BASED_OUTPATIENT_CLINIC_OR_DEPARTMENT_OTHER): Payer: Medicare Other

## 2014-11-27 ENCOUNTER — Other Ambulatory Visit: Payer: Self-pay

## 2014-11-27 ENCOUNTER — Other Ambulatory Visit: Payer: Self-pay | Admitting: Nurse Practitioner

## 2014-11-27 ENCOUNTER — Ambulatory Visit (HOSPITAL_BASED_OUTPATIENT_CLINIC_OR_DEPARTMENT_OTHER): Payer: Medicare Other | Admitting: Nurse Practitioner

## 2014-11-27 ENCOUNTER — Telehealth: Payer: Self-pay | Admitting: Nurse Practitioner

## 2014-11-27 VITALS — BP 145/65 | HR 78 | Temp 98.6°F | Resp 18 | Ht 63.0 in | Wt 222.2 lb

## 2014-11-27 DIAGNOSIS — R634 Abnormal weight loss: Secondary | ICD-10-CM

## 2014-11-27 DIAGNOSIS — C9112 Chronic lymphocytic leukemia of B-cell type in relapse: Secondary | ICD-10-CM

## 2014-11-27 DIAGNOSIS — C9192 Lymphoid leukemia, unspecified, in relapse: Secondary | ICD-10-CM

## 2014-11-27 DIAGNOSIS — D61818 Other pancytopenia: Secondary | ICD-10-CM

## 2014-11-27 DIAGNOSIS — C50912 Malignant neoplasm of unspecified site of left female breast: Secondary | ICD-10-CM

## 2014-11-27 DIAGNOSIS — R599 Enlarged lymph nodes, unspecified: Secondary | ICD-10-CM

## 2014-11-27 DIAGNOSIS — R630 Anorexia: Secondary | ICD-10-CM

## 2014-11-27 LAB — COMPREHENSIVE METABOLIC PANEL (CC13)
ALK PHOS: 71 U/L (ref 40–150)
ALT: 43 U/L (ref 0–55)
AST: 39 U/L — ABNORMAL HIGH (ref 5–34)
Albumin: 3.6 g/dL (ref 3.5–5.0)
Anion Gap: 8 mEq/L (ref 3–11)
BUN: 17 mg/dL (ref 7.0–26.0)
CALCIUM: 9.1 mg/dL (ref 8.4–10.4)
CHLORIDE: 108 meq/L (ref 98–109)
CO2: 24 mEq/L (ref 22–29)
Creatinine: 1.4 mg/dL — ABNORMAL HIGH (ref 0.6–1.1)
EGFR: 43 mL/min/{1.73_m2} — ABNORMAL LOW (ref >90)
Glucose: 133 mg/dl (ref 70–140)
Potassium: 4.3 mEq/L (ref 3.5–5.1)
SODIUM: 140 meq/L (ref 136–145)
TOTAL PROTEIN: 5.8 g/dL — AB (ref 6.4–8.3)
Total Bilirubin: 0.95 mg/dL (ref 0.20–1.20)

## 2014-11-27 LAB — CBC WITH DIFFERENTIAL/PLATELET
BASO%: 1.1 % (ref 0.0–2.0)
Basophils Absolute: 0.1 10*3/uL (ref 0.0–0.1)
EOS%: 0.6 % (ref 0.0–7.0)
Eosinophils Absolute: 0.1 10*3/uL (ref 0.0–0.5)
HEMATOCRIT: 31 % — AB (ref 34.8–46.6)
HEMOGLOBIN: 10.3 g/dL — AB (ref 11.6–15.9)
LYMPH#: 8.6 10*3/uL — AB (ref 0.9–3.3)
LYMPH%: 88.6 % — ABNORMAL HIGH (ref 14.0–49.7)
MCH: 35.7 pg — ABNORMAL HIGH (ref 25.1–34.0)
MCHC: 33.3 g/dL (ref 31.5–36.0)
MCV: 107.1 fL — ABNORMAL HIGH (ref 79.5–101.0)
MONO#: 0.5 10*3/uL (ref 0.1–0.9)
MONO%: 5.4 % (ref 0.0–14.0)
NEUT%: 4.3 % — AB (ref 38.4–76.8)
NEUTROS ABS: 0.4 10*3/uL — AB (ref 1.5–6.5)
Platelets: 39 10*3/uL — ABNORMAL LOW (ref 145–400)
RBC: 2.89 10*6/uL — ABNORMAL LOW (ref 3.70–5.45)
RDW: 26.3 % — ABNORMAL HIGH (ref 11.2–14.5)
WBC: 9.7 10*3/uL (ref 3.9–10.3)

## 2014-11-27 LAB — LACTATE DEHYDROGENASE (CC13): LDH: 411 U/L — ABNORMAL HIGH (ref 125–245)

## 2014-11-27 LAB — HOLD TUBE, BLOOD BANK

## 2014-11-27 LAB — TECHNOLOGIST REVIEW

## 2014-11-27 MED ORDER — ALLOPURINOL 300 MG PO TABS: 300.0000 mg | ORAL_TABLET | Freq: Every day | ORAL | Status: DC

## 2014-11-27 MED ORDER — IDELALISIB 100 MG PO TABS: 100.0000 mg | ORAL_TABLET | Freq: Every day | ORAL | Status: DC

## 2014-11-27 NOTE — Progress Notes (Addendum)
[place] OFFICE PROGRESS NOTE   Diagnosis:  CLL  INTERVAL HISTORY:   [name] returns as scheduled. She was transfused 2 units of blood on 11/21/2014. She feels "a little better" since the blood transfusion. She continues to have sweats. No fever. Appetite is poor. She denies bleeding.  Objective:  Vital signs in last 24 hours:  Blood pressure 145/65, pulse 78, temperature 98.6 F (37 C), temperature source Oral, resp. rate 18, height _0  (1.6 m), weight 222 lb 3.2 oz (100.789 kg), SpO2 99 %.    HEENT: No thrush or ulcers. Resp: Lungs clear bilaterally. Cardio: Regular rate and rhythm. GI: Abdomen soft and nontender. No splenomegaly. Vascular: No leg edema.   Lab Results:  Lab Results  Component Value Date   WBC 9.7 11/27/2014   HGB 10.3* 11/27/2014   HCT 31.0* 11/27/2014   MCV 107.1* 11/27/2014   PLT 39* 11/27/2014   NEUTROABS 0.4* 11/27/2014    Imaging:  No results found.  Medications: I have reviewed the patient's current medications.  Assessment/Plan: 1. Relapsed CLL/well-differentiated lymphocytic lymphoma. Initial diagnosis dates to November 2009. Initially treated with modified fludarabine, Cytoxan and Rituxan with no response. No response to Campath. Partial response to Arzerra initially given between April and June 2010 and monthly through October 2010. In May 2013 there was deterioration of the blood counts. Arzerra was resumed and she again had a partial response. Blood counts began to fall again in August 2014. She began a trial of Ibrutinib 05/27/2013. It was stopped on 06/10/2013 due to profound fatigue, nausea and anorexia. In addition, labs on 06/10/2013 showed significant deterioration in blood counts with the platelet count down to 3000, felt to be secondary to disease progression as this was how she initially presented. Treatment was initiated with Obinutuzumab and Leukeran on 06/23/2013. Blood counts improved. Obinutuzumab  discontinued following cycle 3 due to development of chest tightness requiring hospitalization. She ruled out for an MI. Low dose daily chlorambucil continued until 01/09/2014 when it was placed on hold due to progressive neutropenia/thrombocytopenia.  Bone marrow biopsy 09/06/2014 confirmed involvement with CLL 2. Extreme fatigue, anorexia while on Ibrutinib. 3. Multifocal invasive and noninvasive cancers left breast status post mastectomy 10/09/2010 (mpT2,mpN0) currently on tamoxifen. 4. Hypertension. 5. Recurrent atypical neurologic symptoms with dizziness, vertigo, paresthesias. 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 Mild changes in small vessels. No acute changes. Aspirin placed on hold due to severe thrombocytopenia. 6. Pancytopenia secondary to CLL involving the bone marrow. 7. Elevated creatinine (1.7) 11/21/2014. Renal ultrasound 11/24/2014 showed mild left hydronephrosis. No renal calculi identified.   Disposition: [name] has relapsed CLL. She is pancytopenic and has progressive peripheral adenopathy. She is experiencing anorexia/weight loss and night sweats. We again discussed the treatment options at today's visit. She is agreeable to beginning Idelalisib and Rituxan. Potential side effects were again reviewed. She will begin allopurinol 300 mg daily. Dr. Benay Spice does not feel she will require hospitalization for initiation of treatment. She will be scheduled for Rituxan later this week. We will schedule a follow-up visit in one week.  Patient seen with Dr. Benay Spice. 30 minutes were spent face-to-face at today's visit with the majority of that time involved in counseling/coordination of care.  Ned Card ANP/GNP-BC   11/27/2014  1:22 PM  This was a shared visit with Ned Card. Ms. Creps has symptomatic progression of CLL. I discussed the case with Dr. Beryle Beams and with Dr. Lissa Merlin at [facility]. The plan is to begin a trial of rituximab  and Idelalisib this week. She will be placed on allopurinol for prophylaxis against tumor lysis syndrome. We reviewed the potential toxicities associated with this regimen including the chance for severe diarrhea, colitis, hepatotoxicity, and tumor lysis syndrome. She agrees to proceed. We will dose reduce the Idelalisib secondary to the starting pancytopenia.  We will refer her for CT evaluation of the hydronephrosis.  [name], M.D.

## 2014-11-27 NOTE — Telephone Encounter (Signed)
Labs/ov per 02/29 POF, sent msg to add chemo and will contact pt once chemo has been added for 03/03.... KJ

## 2014-11-28 ENCOUNTER — Telehealth: Payer: Self-pay | Admitting: *Deleted

## 2014-11-28 ENCOUNTER — Telehealth: Payer: Self-pay | Admitting: Nurse Practitioner

## 2014-11-28 ENCOUNTER — Encounter: Payer: Self-pay | Admitting: Oncology

## 2014-11-28 NOTE — Telephone Encounter (Signed)
pts daughter came by to ck lab and to see if it can be added to3/4 which it can per desk nurse   anne

## 2014-11-28 NOTE — Telephone Encounter (Signed)
Per staff message and POF I have scheduled appts. Advised scheduler of appts and no available on 3/3 moved to 3/4. JMW

## 2014-11-28 NOTE — Telephone Encounter (Signed)
I have adjusted 3/24

## 2014-11-28 NOTE — Progress Notes (Signed)
Faxed auth form to [employer] [phone] about rituxan.

## 2014-11-28 NOTE — Telephone Encounter (Signed)
Link Snuffer called returning call from last evening.  Katharine Look asking if her mom will receive chemotherapy this week as Katharine Look lives in [place], [place].  Call back number [phone].

## 2014-11-29 NOTE — Telephone Encounter (Signed)
Patient's daughter called to ask when patient should start Zydelig.  Spoke with Ned Card, NP and she said patient should start on Friday with Rituxan (12/01/14).  Advised daughter of same.

## 2014-11-30 ENCOUNTER — Other Ambulatory Visit: Payer: Medicare Other

## 2014-12-01 ENCOUNTER — Other Ambulatory Visit: Payer: Medicare Other

## 2014-12-01 ENCOUNTER — Ambulatory Visit (HOSPITAL_BASED_OUTPATIENT_CLINIC_OR_DEPARTMENT_OTHER): Payer: Medicare Other

## 2014-12-01 ENCOUNTER — Ambulatory Visit: Payer: Medicare Other

## 2014-12-01 VITALS — BP 168/74 | HR 85 | Temp 98.3°F

## 2014-12-01 DIAGNOSIS — C8590 Non-Hodgkin lymphoma, unspecified, unspecified site: Secondary | ICD-10-CM

## 2014-12-01 DIAGNOSIS — Z5112 Encounter for antineoplastic immunotherapy: Secondary | ICD-10-CM

## 2014-12-01 DIAGNOSIS — C9112 Chronic lymphocytic leukemia of B-cell type in relapse: Secondary | ICD-10-CM

## 2014-12-01 DIAGNOSIS — Z95828 Presence of other vascular implants and grafts: Secondary | ICD-10-CM

## 2014-12-01 DIAGNOSIS — C9192 Lymphoid leukemia, unspecified, in relapse: Secondary | ICD-10-CM

## 2014-12-01 LAB — HEPATITIS B CORE ANTIBODY, TOTAL: Hep B Core Total Ab: NONREACTIVE

## 2014-12-01 LAB — HEPATITIS B SURFACE ANTIGEN: HEP B S AG: NEGATIVE

## 2014-12-01 MED ORDER — HEPARIN SOD (PORK) LOCK FLUSH 100 UNIT/ML IV SOLN
500.0000 [IU] | Freq: Once | INTRAVENOUS | Status: AC | PRN
  Administered 2014-12-01: 500 [IU]
  Filled 2014-12-01: qty 5

## 2014-12-01 MED ORDER — DIPHENHYDRAMINE HCL 25 MG PO CAPS
50.0000 mg | ORAL_CAPSULE | Freq: Once | ORAL | Status: AC
  Administered 2014-12-01: 50 mg via ORAL

## 2014-12-01 MED ORDER — SODIUM CHLORIDE 0.9 % IV SOLN
375.0000 mg/m2 | Freq: Once | INTRAVENOUS | Status: AC
  Administered 2014-12-01: 800 mg via INTRAVENOUS
  Filled 2014-12-01: qty 80

## 2014-12-01 MED ORDER — SODIUM CHLORIDE 0.9 % IV SOLN
Freq: Once | INTRAVENOUS | Status: AC
  Administered 2014-12-01: 10:00:00 via INTRAVENOUS

## 2014-12-01 MED ORDER — ACETAMINOPHEN 325 MG PO TABS
ORAL_TABLET | ORAL | Status: AC
  Filled 2014-12-01: qty 2

## 2014-12-01 MED ORDER — SODIUM CHLORIDE 0.9 % IJ SOLN
10.0000 mL | INTRAMUSCULAR | Status: DC | PRN
  Administered 2014-12-01: 10 mL
  Filled 2014-12-01: qty 10

## 2014-12-01 MED ORDER — DIPHENHYDRAMINE HCL 25 MG PO CAPS
ORAL_CAPSULE | ORAL | Status: AC
Start: 2014-12-01 — End: 2014-12-01
  Filled 2014-12-01: qty 2

## 2014-12-01 MED ORDER — SODIUM CHLORIDE 0.9 % IJ SOLN
10.0000 mL | INTRAMUSCULAR | Status: DC | PRN
  Administered 2014-12-01: 10 mL via INTRAVENOUS
  Filled 2014-12-01: qty 10

## 2014-12-01 MED ORDER — HEPARIN SOD (PORK) LOCK FLUSH 100 UNIT/ML IV SOLN
500.0000 [IU] | Freq: Once | INTRAVENOUS | Status: AC
  Administered 2014-12-01: 500 [IU] via INTRAVENOUS
  Filled 2014-12-01: qty 5

## 2014-12-01 MED ORDER — ACETAMINOPHEN 325 MG PO TABS
650.0000 mg | ORAL_TABLET | Freq: Once | ORAL | Status: AC
  Administered 2014-12-01: 650 mg via ORAL

## 2014-12-01 NOTE — Patient Instructions (Signed)
[facility] Discharge Instructions for Patients  Today you received the following: Rituxan.   To help prevent nausea and vomiting after your treatment, we encourage you to take your nausea medication as directed.    If you develop nausea and vomiting that is not controlled by your nausea medication, call the clinic.   BELOW ARE SYMPTOMS THAT SHOULD BE REPORTED IMMEDIATELY:  *FEVER GREATER THAN 100.5 F  *CHILLS WITH OR WITHOUT FEVER  NAUSEA AND VOMITING THAT IS NOT CONTROLLED WITH YOUR NAUSEA MEDICATION  *UNUSUAL SHORTNESS OF BREATH  *UNUSUAL BRUISING OR BLEEDING  TENDERNESS IN MOUTH AND THROAT WITH OR WITHOUT PRESENCE OF ULCERS  *URINARY PROBLEMS  *BOWEL PROBLEMS  UNUSUAL RASH Items with * indicate a potential emergency and should be followed up as soon as possible.  Feel free to call the clinic you have any questions or concerns. The clinic phone number is [phone].   Rituximab injection What is this medicine? RITUXIMAB (ri TUX i mab) is a monoclonal antibody. This medicine changes the way the body's immune system works. It is used commonly to treat non-Hodgkin's lymphoma and other conditions. In cancer cells, this drug targets a specific protein within cancer cells and stops the cancer cells from growing. It is also used to treat rhuematoid arthritis (RA). In RA, this medicine slow the inflammatory process and help reduce joint pain and swelling. This medicine is often used with other cancer or arthritis medications. This medicine may be used for other purposes; ask your health care provider or pharmacist if you have questions. COMMON BRAND NAME(S): Rituxan What should I tell my health care provider before I take this medicine? They need to know if you have any of these conditions: -blood disorders -heart disease -history of hepatitis B -infection (especially a virus infection such as chickenpox, cold sores, or herpes) -irregular heartbeat -kidney  disease -lung or breathing disease, like asthma -lupus -an unusual or allergic reaction to rituximab, mouse proteins, other medicines, foods, dyes, or preservatives -pregnant or trying to get pregnant -breast-feeding How should I use this medicine? This medicine is for infusion into a vein. It is administered in a hospital or clinic by a specially trained health care professional. A special MedGuide will be given to you by the pharmacist with each prescription and refill. Be sure to read this information carefully each time. Talk to your pediatrician regarding the use of this medicine in children. This medicine is not approved for use in children. Overdosage: If you think you have taken too much of this medicine contact a poison control center or emergency room at once. NOTE: This medicine is only for you. Do not share this medicine with others. What if I miss a dose? It is important not to miss a dose. Call your doctor or health care professional if you are unable to keep an appointment. What may interact with this medicine? -cisplatin -medicines for blood pressure -some other medicines for arthritis -vaccines This list may not describe all possible interactions. Give your health care provider a list of all the medicines, herbs, non-prescription drugs, or dietary supplements you use. Also tell them if you smoke, drink alcohol, or use illegal drugs. Some items may interact with your medicine. What should I watch for while using this medicine? Report any side effects that you notice during your treatment right away, such as changes in your breathing, fever, chills, dizziness or lightheadedness. These effects are more common with the first dose. Visit your prescriber or health care  professional for checks on your progress. You will need to have regular blood work. Report any other side effects. The side effects of this medicine can continue after you finish your treatment. Continue your course of  treatment even though you feel ill unless your doctor tells you to stop. Call your doctor or health care professional for advice if you get a fever, chills or sore throat, or other symptoms of a cold or flu. Do not treat yourself. This drug decreases your body's ability to fight infections. Try to avoid being around people who are sick. This medicine may increase your risk to bruise or bleed. Call your doctor or health care professional if you notice any unusual bleeding. Be careful brushing and flossing your teeth or using a toothpick because you may get an infection or bleed more easily. If you have any dental work done, tell your dentist you are receiving this medicine. Avoid taking products that contain aspirin, acetaminophen, ibuprofen, naproxen, or ketoprofen unless instructed by your doctor. These medicines may hide a fever. Do not become pregnant while taking this medicine. Women should inform their doctor if they wish to become pregnant or think they might be pregnant. There is a potential for serious side effects to an unborn child. Talk to your health care professional or pharmacist for more information. Do not breast-feed an infant while taking this medicine. What side effects may I notice from receiving this medicine? Side effects that you should report to your doctor or health care professional as soon as possible: -allergic reactions like skin rash, itching or hives, swelling of the face, lips, or tongue -low blood counts - this medicine may decrease the number of white blood cells, red blood cells and platelets. You may be at increased risk for infections and bleeding. -signs of infection - fever or chills, cough, sore throat, pain or difficulty passing urine -signs of decreased platelets or bleeding - bruising, pinpoint red spots on the skin, black, tarry stools, blood in the urine -signs of decreased red blood cells - unusually weak or tired, fainting spells, lightheadedness -breathing  problems -confused, not responsive -chest pain -fast, irregular heartbeat -feeling faint or lightheaded, falls -mouth sores -redness, blistering, peeling or loosening of the skin, including inside the mouth -stomach pain -swelling of the ankles, feet, or hands -trouble passing urine or change in the amount of urine Side effects that usually do not require medical attention (report to your doctor or other health care professional if they continue or are bothersome): -anxiety -headache -loss of appetite -muscle aches -nausea -night sweats This list may not describe all possible side effects. Call your doctor for medical advice about side effects. You may report side effects to FDA at 1-800-FDA-1088. Where should I keep my medicine? This drug is given in a hospital or clinic and will not be stored at home. NOTE: This sheet is a summary. It may not cover all possible information. If you have questions about this medicine, talk to your doctor, pharmacist, or health care provider.  2015, Elsevier/Gold Standard. (2008-05-15 14:04:59)

## 2014-12-01 NOTE — Progress Notes (Signed)
Per Ned Card, NP okay to proceed with treatment today, all labs were reviewed from 11/27/14.   Delay in starting pt's treatment today due to waiting for authorization from insurance company.

## 2014-12-04 ENCOUNTER — Telehealth: Payer: Self-pay | Admitting: Oncology

## 2014-12-04 ENCOUNTER — Other Ambulatory Visit (HOSPITAL_BASED_OUTPATIENT_CLINIC_OR_DEPARTMENT_OTHER): Payer: Medicare Other

## 2014-12-04 ENCOUNTER — Other Ambulatory Visit: Payer: Self-pay | Admitting: Nurse Practitioner

## 2014-12-04 ENCOUNTER — Ambulatory Visit (HOSPITAL_BASED_OUTPATIENT_CLINIC_OR_DEPARTMENT_OTHER): Payer: Medicare Other | Admitting: Nurse Practitioner

## 2014-12-04 VITALS — BP 145/40 | HR 67 | Temp 98.4°F | Resp 18 | Ht 63.0 in | Wt 214.7 lb

## 2014-12-04 DIAGNOSIS — C9112 Chronic lymphocytic leukemia of B-cell type in relapse: Secondary | ICD-10-CM

## 2014-12-04 DIAGNOSIS — I10 Essential (primary) hypertension: Secondary | ICD-10-CM

## 2014-12-04 DIAGNOSIS — C9192 Lymphoid leukemia, unspecified, in relapse: Secondary | ICD-10-CM

## 2014-12-04 DIAGNOSIS — D61818 Other pancytopenia: Secondary | ICD-10-CM

## 2014-12-04 DIAGNOSIS — C50912 Malignant neoplasm of unspecified site of left female breast: Secondary | ICD-10-CM

## 2014-12-04 LAB — HOLD TUBE, BLOOD BANK

## 2014-12-04 LAB — CBC WITH DIFFERENTIAL/PLATELET
BASO%: 0 % (ref 0.0–2.0)
Basophils Absolute: 0 10*3/uL (ref 0.0–0.1)
EOS%: 0.5 % (ref 0.0–7.0)
Eosinophils Absolute: 0.1 10*3/uL (ref 0.0–0.5)
HCT: 28.6 % — ABNORMAL LOW (ref 34.8–46.6)
HGB: 9.4 g/dL — ABNORMAL LOW (ref 11.6–15.9)
LYMPH%: 96.4 % — AB (ref 14.0–49.7)
MCH: 35.7 pg — AB (ref 25.1–34.0)
MCHC: 33.1 g/dL (ref 31.5–36.0)
MCV: 108 fL — AB (ref 79.5–101.0)
MONO#: 0.1 10*3/uL (ref 0.1–0.9)
MONO%: 0.6 % (ref 0.0–14.0)
NEUT#: 0.6 10*3/uL — ABNORMAL LOW (ref 1.5–6.5)
NEUT%: 2.5 % — ABNORMAL LOW (ref 38.4–76.8)
PLATELETS: 45 10*3/uL — AB (ref 145–400)
RBC: 2.65 10*6/uL — AB (ref 3.70–5.45)
RDW: 26.5 % — ABNORMAL HIGH (ref 11.2–14.5)
WBC: 23 10*3/uL — AB (ref 3.9–10.3)
lymph#: 22.2 10*3/uL — ABNORMAL HIGH (ref 0.9–3.3)

## 2014-12-04 LAB — COMPREHENSIVE METABOLIC PANEL (CC13)
ALBUMIN: 3.5 g/dL (ref 3.5–5.0)
ALT: 29 U/L (ref 0–55)
ANION GAP: 11 meq/L (ref 3–11)
AST: 53 U/L — ABNORMAL HIGH (ref 5–34)
Alkaline Phosphatase: 65 U/L (ref 40–150)
BILIRUBIN TOTAL: 0.5 mg/dL (ref 0.20–1.20)
BUN: 28.8 mg/dL — ABNORMAL HIGH (ref 7.0–26.0)
CO2: 19 meq/L — AB (ref 22–29)
Calcium: 8.8 mg/dL (ref 8.4–10.4)
Chloride: 106 mEq/L (ref 98–109)
Creatinine: 1.4 mg/dL — ABNORMAL HIGH (ref 0.6–1.1)
EGFR: 41 mL/min/{1.73_m2} — AB (ref >90)
Glucose: 132 mg/dl (ref 70–140)
POTASSIUM: 3.9 meq/L (ref 3.5–5.1)
Sodium: 136 mEq/L (ref 136–145)
TOTAL PROTEIN: 5.9 g/dL — AB (ref 6.4–8.3)

## 2014-12-04 LAB — LACTATE DEHYDROGENASE (CC13)

## 2014-12-04 LAB — TECHNOLOGIST REVIEW

## 2014-12-04 LAB — URIC ACID (CC13): URIC ACID, SERUM: 7.9 mg/dL — AB (ref 2.6–7.4)

## 2014-12-04 NOTE — Telephone Encounter (Signed)
GAVE PT/DTR AVS REPORT AND APPTS FOR MARCH. S/W LINDA RE CT AUTH. CURRENTLY WAITING ON PREAUTH FOR CT PRIOR TO SCHEDULING PT/DTR/LT AWARE.

## 2014-12-04 NOTE — Progress Notes (Signed)
[place] OFFICE PROGRESS NOTE   Diagnosis:  CLL  INTERVAL HISTORY:   [name] returns as scheduled. She began Idelalisib on 12/01/2014. She received Rituxan on 12/01/2014. She denies nausea/vomiting. No mouth sores. No diarrhea. No rash. She denies any signs of allergic reaction. No fever. She has had 2 episodes of feeling "cold". No shaking chills.  Objective:  Vital signs in last 24 hours:  Blood pressure 145/40, pulse 67, temperature 98.4 F (36.9 C), temperature source Oral, resp. rate 18, height 5' 3"  (1.6 m), weight 214 lb 11.2 oz (97.387 kg), SpO2 100 %.    HEENT: No thrush or ulcers. Lymphatics: Bilateral axillary adenopathy appears softer and more mobile. Resp: Lungs clear bilaterally. Cardio: Regular rate and rhythm. GI: Abdomen soft and nontender. No spine megaly. Vascular: No leg edema. Port-A-Cath without erythema.   Lab Results:  Lab Results  Component Value Date   WBC 23.0* 12/04/2014   HGB 9.4* 12/04/2014   HCT 28.6* 12/04/2014   MCV 108.0* 12/04/2014   PLT 45* 12/04/2014   NEUTROABS 0.6* 12/04/2014    Imaging:  No results found.  Medications: I have reviewed the patient's current medications.  Assessment/Plan: 1. Relapsed CLL/well-differentiated lymphocytic lymphoma. Initial diagnosis dates to November 2009. Initially treated with modified fludarabine, Cytoxan and Rituxan with no response. No response to Campath. Partial response to Arzerra initially given between April and June 2010 and monthly through October 2010. In May 2013 there was deterioration of the blood counts. Arzerra was resumed and she again had a partial response. Blood counts began to fall again in August 2014. She began a trial of Ibrutinib 05/27/2013. It was stopped on 06/10/2013 due to profound fatigue, nausea and anorexia. In addition, labs on 06/10/2013 showed significant deterioration in blood counts with the platelet count down to 3000, felt to be secondary to  disease progression as this was how she initially presented. Treatment was initiated with Obinutuzumab and Leukeran on 06/23/2013. Blood counts improved. Obinutuzumab discontinued following cycle 3 due to development of chest tightness requiring hospitalization. She ruled out for an MI. Low dose daily chlorambucil continued until 01/09/2014 when it was placed on hold due to progressive neutropenia/thrombocytopenia.  Bone marrow biopsy 09/06/2014 confirmed involvement with CLL  Initiation of Rituxan/Idelalisib 12/01/2014 2. Extreme fatigue, anorexia while on Ibrutinib. 3. Multifocal invasive and noninvasive cancers left breast status post mastectomy 10/09/2010 (mpT2,mpN0) currently on tamoxifen. 4. Hypertension. 5. Recurrent atypical neurologic symptoms with dizziness, vertigo, paresthesias. 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 Mild changes in small vessels. No acute changes. Aspirin placed on hold due to severe thrombocytopenia. 6. Pancytopenia secondary to CLL involving the bone marrow. 7. Elevated creatinine (1.7) 11/21/2014. Renal ultrasound 11/24/2014 showed mild left hydronephrosis. No renal calculi identified. She has been referred for a CT scan.   Disposition: [name] appears unchanged. She began Idelalisib and Rituxan 12/01/2014. Hemoglobin, absolute neutrophil count and platelets are stable. She will return for repeat labs on 12/08/2014 and 12/11/2014. She will return for a follow-up visit on 12/15/2014. She understands to contact the office prior to her next visit with fever, chills, other signs of infection, bleeding.  We referred her for a CT scan of the abdomen/pelvis to follow-up the hydronephrosis identified on recent renal ultrasound. This has not been completed as of today. We will try to get it scheduled this week.  Plan reviewed with Dr. Benay Spice.  Ned Card ANP/GNP-BC   12/04/2014  12:45 PM

## 2014-12-04 NOTE — Telephone Encounter (Signed)
per Vaughan Basta no preauth required for ct scan. s/w Morey Hummingbird at [facility] and asked that [facility] proceed with scheduling as preauth is not required per [facility]. ct was orderd back on 2/29 and not scheduled. dtr given schedule prior to leaving today. called dtr and lmonvm informing her that it is ok to proceed with ct and she will be receivig a call from [facility].

## 2014-12-08 ENCOUNTER — Other Ambulatory Visit (HOSPITAL_BASED_OUTPATIENT_CLINIC_OR_DEPARTMENT_OTHER): Payer: Medicare Other

## 2014-12-08 ENCOUNTER — Encounter (HOSPITAL_COMMUNITY): Payer: Self-pay

## 2014-12-08 ENCOUNTER — Ambulatory Visit (HOSPITAL_COMMUNITY)
Admission: RE | Admit: 2014-12-08 | Discharge: 2014-12-08 | Disposition: A | Discharge status: Home / Self Care | Payer: Medicare Other | Source: Ambulatory Visit | Attending: Nurse Practitioner | Admitting: Nurse Practitioner

## 2014-12-08 ENCOUNTER — Telehealth: Payer: Self-pay | Admitting: *Deleted

## 2014-12-08 DIAGNOSIS — C9192 Lymphoid leukemia, unspecified, in relapse: Secondary | ICD-10-CM

## 2014-12-08 DIAGNOSIS — C9112 Chronic lymphocytic leukemia of B-cell type in relapse: Secondary | ICD-10-CM | POA: Insufficient documentation

## 2014-12-08 DIAGNOSIS — Z79899 Other long term (current) drug therapy: Secondary | ICD-10-CM | POA: Diagnosis not present

## 2014-12-08 LAB — COMPREHENSIVE METABOLIC PANEL (CC13)
ALBUMIN: 3.8 g/dL (ref 3.5–5.0)
ALT: 86 U/L — ABNORMAL HIGH (ref 0–55)
ANION GAP: 10 meq/L (ref 3–11)
AST: 76 U/L — ABNORMAL HIGH (ref 5–34)
Alkaline Phosphatase: 66 U/L (ref 40–150)
BILIRUBIN TOTAL: 0.71 mg/dL (ref 0.20–1.20)
BUN: 20.9 mg/dL (ref 7.0–26.0)
CHLORIDE: 106 meq/L (ref 98–109)
CO2: 21 meq/L — AB (ref 22–29)
Calcium: 9.1 mg/dL (ref 8.4–10.4)
Creatinine: 1.1 mg/dL (ref 0.6–1.1)
EGFR: 54 mL/min/{1.73_m2} — AB (ref >90)
GLUCOSE: 92 mg/dL (ref 70–140)
POTASSIUM: 4.3 meq/L (ref 3.5–5.1)
Sodium: 137 mEq/L (ref 136–145)
TOTAL PROTEIN: 6.1 g/dL — AB (ref 6.4–8.3)

## 2014-12-08 LAB — TECHNOLOGIST REVIEW

## 2014-12-08 LAB — CBC WITH DIFFERENTIAL/PLATELET
BASO%: 0 % (ref 0.0–2.0)
Basophils Absolute: 0 10*3/uL (ref 0.0–0.1)
EOS%: 0.6 % (ref 0.0–7.0)
Eosinophils Absolute: 0.1 10*3/uL (ref 0.0–0.5)
HCT: 29.4 % — ABNORMAL LOW (ref 34.8–46.6)
HGB: 9.4 g/dL — ABNORMAL LOW (ref 11.6–15.9)
LYMPH#: 12.8 10*3/uL — AB (ref 0.9–3.3)
LYMPH%: 96.8 % — ABNORMAL HIGH (ref 14.0–49.7)
MCH: 34.6 pg — ABNORMAL HIGH (ref 25.1–34.0)
MCHC: 32 g/dL (ref 31.5–36.0)
MCV: 107.9 fL — ABNORMAL HIGH (ref 79.5–101.0)
MONO#: 0.1 10*3/uL (ref 0.1–0.9)
MONO%: 0.5 % (ref 0.0–14.0)
NEUT%: 2.1 % — ABNORMAL LOW (ref 38.4–76.8)
NEUTROS ABS: 0.3 10*3/uL — AB (ref 1.5–6.5)
Platelets: 49 10*3/uL — ABNORMAL LOW (ref 145–400)
RBC: 2.73 10*6/uL — ABNORMAL LOW (ref 3.70–5.45)
RDW: 26.5 % — AB (ref 11.2–14.5)
WBC: 13.2 10*3/uL — ABNORMAL HIGH (ref 3.9–10.3)

## 2014-12-08 LAB — LACTATE DEHYDROGENASE (CC13): LDH: 733 U/L — AB (ref 125–245)

## 2014-12-08 LAB — URIC ACID (CC13): Uric Acid, Serum: 6.4 mg/dl (ref 2.6–7.4)

## 2014-12-08 LAB — PHOSPHORUS: Phosphorus: 2.3 mg/dL (ref 2.3–4.6)

## 2014-12-08 MED ORDER — IOHEXOL 300 MG/ML  SOLN
50.0000 mL | Freq: Once | INTRAMUSCULAR | Status: AC | PRN
  Administered 2014-12-08: 50 mL via ORAL

## 2014-12-08 NOTE — Telephone Encounter (Signed)
Ned Card, NP aware of critical [place]. Stable for pt. Will recheck labs on 3/14.

## 2014-12-11 ENCOUNTER — Other Ambulatory Visit (HOSPITAL_BASED_OUTPATIENT_CLINIC_OR_DEPARTMENT_OTHER): Payer: Medicare Other

## 2014-12-11 ENCOUNTER — Telehealth: Payer: Self-pay | Admitting: *Deleted

## 2014-12-11 DIAGNOSIS — C9112 Chronic lymphocytic leukemia of B-cell type in relapse: Secondary | ICD-10-CM

## 2014-12-11 DIAGNOSIS — C9192 Lymphoid leukemia, unspecified, in relapse: Secondary | ICD-10-CM

## 2014-12-11 LAB — CBC WITH DIFFERENTIAL/PLATELET
BASO%: 0.1 % (ref 0.0–2.0)
Basophils Absolute: 0 10*3/uL (ref 0.0–0.1)
EOS%: 1 % (ref 0.0–7.0)
Eosinophils Absolute: 0.1 10*3/uL (ref 0.0–0.5)
HCT: 27.7 % — ABNORMAL LOW (ref 34.8–46.6)
HGB: 9.1 g/dL — ABNORMAL LOW (ref 11.6–15.9)
LYMPH%: 96.3 % — ABNORMAL HIGH (ref 14.0–49.7)
MCH: 35.1 pg — ABNORMAL HIGH (ref 25.1–34.0)
MCHC: 32.7 g/dL (ref 31.5–36.0)
MCV: 107.4 fL — ABNORMAL HIGH (ref 79.5–101.0)
MONO#: 0.2 10*3/uL (ref 0.1–0.9)
MONO%: 1.2 % (ref 0.0–14.0)
NEUT#: 0.2 10*3/uL — CL (ref 1.5–6.5)
NEUT%: 1.4 % — AB (ref 38.4–76.8)
PLATELETS: 50 10*3/uL — AB (ref 145–400)
RBC: 2.58 10*6/uL — ABNORMAL LOW (ref 3.70–5.45)
RDW: 25.9 % — ABNORMAL HIGH (ref 11.2–14.5)
WBC: 13.1 10*3/uL — ABNORMAL HIGH (ref 3.9–10.3)
lymph#: 12.6 10*3/uL — ABNORMAL HIGH (ref 0.9–3.3)

## 2014-12-11 LAB — TECHNOLOGIST REVIEW

## 2014-12-11 NOTE — Telephone Encounter (Signed)
Ned Card, NP made aware of lab results from today.

## 2014-12-15 ENCOUNTER — Ambulatory Visit (HOSPITAL_BASED_OUTPATIENT_CLINIC_OR_DEPARTMENT_OTHER): Payer: Medicare Other

## 2014-12-15 ENCOUNTER — Telehealth: Payer: Self-pay | Admitting: Nurse Practitioner

## 2014-12-15 ENCOUNTER — Other Ambulatory Visit (HOSPITAL_BASED_OUTPATIENT_CLINIC_OR_DEPARTMENT_OTHER): Payer: Medicare Other

## 2014-12-15 ENCOUNTER — Ambulatory Visit (HOSPITAL_BASED_OUTPATIENT_CLINIC_OR_DEPARTMENT_OTHER): Payer: Medicare Other | Admitting: Nurse Practitioner

## 2014-12-15 VITALS — BP 144/46 | HR 72 | Temp 98.6°F | Resp 18 | Ht 63.0 in | Wt 209.6 lb

## 2014-12-15 DIAGNOSIS — C9112 Chronic lymphocytic leukemia of B-cell type in relapse: Secondary | ICD-10-CM

## 2014-12-15 DIAGNOSIS — C9192 Lymphoid leukemia, unspecified, in relapse: Secondary | ICD-10-CM

## 2014-12-15 DIAGNOSIS — D6959 Other secondary thrombocytopenia: Secondary | ICD-10-CM

## 2014-12-15 DIAGNOSIS — D709 Neutropenia, unspecified: Secondary | ICD-10-CM

## 2014-12-15 DIAGNOSIS — R109 Unspecified abdominal pain: Secondary | ICD-10-CM

## 2014-12-15 LAB — COMPREHENSIVE METABOLIC PANEL (CC13)
ALBUMIN: 3.3 g/dL — AB (ref 3.5–5.0)
ALT: 49 U/L (ref 0–55)
ANION GAP: 10 meq/L (ref 3–11)
AST: 24 U/L (ref 5–34)
Alkaline Phosphatase: 70 U/L (ref 40–150)
BILIRUBIN TOTAL: 0.86 mg/dL (ref 0.20–1.20)
BUN: 20.1 mg/dL (ref 7.0–26.0)
CO2: 24 mEq/L (ref 22–29)
Calcium: 9 mg/dL (ref 8.4–10.4)
Chloride: 102 mEq/L (ref 98–109)
Creatinine: 1.3 mg/dL — ABNORMAL HIGH (ref 0.6–1.1)
EGFR: 47 mL/min/{1.73_m2} — ABNORMAL LOW (ref >90)
Glucose: 123 mg/dl (ref 70–140)
Potassium: 4 mEq/L (ref 3.5–5.1)
Sodium: 135 mEq/L — ABNORMAL LOW (ref 136–145)
TOTAL PROTEIN: 6.1 g/dL — AB (ref 6.4–8.3)

## 2014-12-15 LAB — CBC WITH DIFFERENTIAL/PLATELET
BASO%: 0 % (ref 0.0–2.0)
Basophils Absolute: 0 10*3/uL (ref 0.0–0.1)
EOS%: 0.5 % (ref 0.0–7.0)
Eosinophils Absolute: 0.1 10*3/uL (ref 0.0–0.5)
HCT: 27.5 % — ABNORMAL LOW (ref 34.8–46.6)
HGB: 8.9 g/dL — ABNORMAL LOW (ref 11.6–15.9)
LYMPH%: 98 % — ABNORMAL HIGH (ref 14.0–49.7)
MCH: 35.3 pg — AB (ref 25.1–34.0)
MCHC: 32.5 g/dL (ref 31.5–36.0)
MCV: 108.6 fL — AB (ref 79.5–101.0)
MONO#: 0.1 10*3/uL (ref 0.1–0.9)
MONO%: 0.3 % (ref 0.0–14.0)
NEUT#: 0.2 10*3/uL — CL (ref 1.5–6.5)
NEUT%: 1.2 % — ABNORMAL LOW (ref 38.4–76.8)
Platelets: 48 10*3/uL — ABNORMAL LOW (ref 145–400)
RBC: 2.53 10*6/uL — AB (ref 3.70–5.45)
RDW: 26.7 % — AB (ref 11.2–14.5)
WBC: 18.7 10*3/uL — ABNORMAL HIGH (ref 3.9–10.3)
lymph#: 18.3 10*3/uL — ABNORMAL HIGH (ref 0.9–3.3)

## 2014-12-15 LAB — URIC ACID (CC13): Uric Acid, Serum: 5.8 mg/dl (ref 2.6–7.4)

## 2014-12-15 LAB — TECHNOLOGIST REVIEW

## 2014-12-15 LAB — LACTATE DEHYDROGENASE (CC13): LDH: 351 U/L — AB (ref 125–245)

## 2014-12-15 MED ORDER — HEPARIN SOD (PORK) LOCK FLUSH 100 UNIT/ML IV SOLN
500.0000 [IU] | Freq: Once | INTRAVENOUS | Status: AC
  Administered 2014-12-15: 500 [IU] via INTRAVENOUS
  Filled 2014-12-15: qty 5

## 2014-12-15 MED ORDER — CIPROFLOXACIN HCL 500 MG PO TABS
500.0000 mg | ORAL_TABLET | Freq: Every day | ORAL | Status: DC
Start: 2014-12-15 — End: 2015-02-17

## 2014-12-15 MED ORDER — SODIUM CHLORIDE 0.9 % IJ SOLN
10.0000 mL | INTRAMUSCULAR | Status: DC | PRN
  Administered 2014-12-15: 10 mL via INTRAVENOUS
  Filled 2014-12-15: qty 10

## 2014-12-15 NOTE — Telephone Encounter (Signed)
Pt confirmed labs/ov per 03/18 POF, gave pt AVS and Calendar......Marland Kitchen KJ

## 2014-12-15 NOTE — Patient Instructions (Signed)

## 2014-12-15 NOTE — Progress Notes (Addendum)
[place] OFFICE PROGRESS NOTE   Diagnosis:  CLL  INTERVAL HISTORY:   [name] returns as scheduled. She has recently noted a poor appetite. She is tolerating liquids including nutritional supplements without difficulty. She has lost some weight. She is having intermittent nausea. No vomiting. She has not tried her nausea medication. She is having intermittent pain at the upper abdomen. No diarrhea. Sweats are better. No fever. She has stable dyspnea on exertion.  Objective:  Vital signs in last 24 hours:  Blood pressure 144/46, pulse 72, temperature 98.6 F (37 C), temperature source Oral, resp. rate 18, height 5' 3"  (1.6 m), weight 209 lb 9.6 oz (95.074 kg), SpO2 100 %.    HEENT: No thrush or ulcers. Lymphatics: No neck adenopathy. 2-3 small 1-2 cm left axillary lymph nodes. No definite right axillary adenopathy. Resp: Lungs clear bilaterally. Cardio: Regular rate and rhythm. GI: Abdomen is soft. Mild generalized tenderness. Bowel sounds active. No organomegaly. No mass. Vascular: No leg edema. Port-A-Cath without erythema.   Lab Results:  Lab Results  Component Value Date   WBC 18.7* 12/15/2014   HGB 8.9* 12/15/2014   HCT 27.5* 12/15/2014   MCV 108.6* 12/15/2014   PLT 48* 12/15/2014   NEUTROABS 0.2* 12/15/2014    Imaging:  No results found.  Medications: I have reviewed the patient's current medications.  Assessment/Plan: 1. Relapsed CLL/well-differentiated lymphocytic lymphoma. Initial diagnosis dates to November 2009. Initially treated with modified fludarabine, Cytoxan and Rituxan with no response. No response to Campath. Partial response to Arzerra initially given between April and June 2010 and monthly through October 2010. In May 2013 there was deterioration of the blood counts. Arzerra was resumed and she again had a partial response. Blood counts began to fall again in August 2014. She began a trial of Ibrutinib 05/27/2013. It was stopped on  06/10/2013 due to profound fatigue, nausea and anorexia. In addition, labs on 06/10/2013 showed significant deterioration in blood counts with the platelet count down to 3000, felt to be secondary to disease progression as this was how she initially presented. Treatment was initiated with Obinutuzumab and Leukeran on 06/23/2013. Blood counts improved. Obinutuzumab discontinued following cycle 3 due to development of chest tightness requiring hospitalization. She ruled out for an MI. Low dose daily chlorambucil continued until 01/09/2014 when it was placed on hold due to progressive neutropenia/thrombocytopenia.  Bone marrow biopsy 09/06/2014 confirmed involvement with CLL  Initiation of Rituxan/Idelalisib 12/01/2014 2. Extreme fatigue, anorexia while on Ibrutinib. 3. Multifocal invasive and noninvasive cancers left breast status post mastectomy 10/09/2010 (mpT2,mpN0) currently on tamoxifen. 4. Hypertension. 5. Recurrent atypical neurologic symptoms with dizziness, vertigo, paresthesias. 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 Mild changes in small vessels. No acute changes. Aspirin placed on hold due to severe thrombocytopenia. 6. Pancytopenia secondary to CLL involving the bone marrow. 7. Elevated creatinine (1.7) 11/21/2014. Renal ultrasound 11/24/2014 showed mild left hydronephrosis. No renal calculi identified. CT scans 12/08/2014 showed marked progression of lymphadenopathy in the abdomen and pelvis. Kidneys and ureters appeared normal.   Disposition: [name] continues Idelalisib. She is due for the next Rituxan 12/22/2014. Blood counts appear to have stabilized. Peripheral adenopathy is significantly better.  She continues to have severe neutropenia. We will begin prophylactic antibiotics with Cipro 500 mg daily. She understands to contact the office with fever or other signs of infection.  For the nausea she will try Compazine.  We encouraged her to  increase the number  of nutritional supplements she is consuming on a daily basis.  For the intermittent upper abdominal pain she will try Maalox or Mylanta. She will contact the office if the pain worsens.  We will see her in follow-up on 12/22/2014.  Patient seen with Dr. Benay Spice. 25 minutes were spent face-to-face at today's visit with the majority of that time involved in counseling/coordination of care.   Ned Card ANP/GNP-BC   12/15/2014  1:56 PM This was a shared visit with Ned Card. [name] was interviewed and examined. There has been a significant decrease in the palpable lymphadenopathy since beginning the current regimen. The LDH is lower. She appears to be responding. There is persistent severe neutropenia. He added prophylactic ciprofloxacin.  She will return for an office visit and another treatment with rituximab week.  [name], M.D.

## 2014-12-17 ENCOUNTER — Other Ambulatory Visit: Payer: Self-pay | Admitting: Oncology

## 2014-12-20 ENCOUNTER — Other Ambulatory Visit: Payer: Self-pay | Admitting: Oncology

## 2014-12-21 ENCOUNTER — Ambulatory Visit: Payer: Medicare Other

## 2014-12-21 ENCOUNTER — Other Ambulatory Visit: Payer: Medicare Other

## 2014-12-22 ENCOUNTER — Ambulatory Visit (HOSPITAL_BASED_OUTPATIENT_CLINIC_OR_DEPARTMENT_OTHER): Payer: Medicare Other | Admitting: Nurse Practitioner

## 2014-12-22 ENCOUNTER — Other Ambulatory Visit (HOSPITAL_BASED_OUTPATIENT_CLINIC_OR_DEPARTMENT_OTHER): Payer: Medicare Other

## 2014-12-22 ENCOUNTER — Ambulatory Visit (HOSPITAL_BASED_OUTPATIENT_CLINIC_OR_DEPARTMENT_OTHER): Payer: Medicare Other

## 2014-12-22 ENCOUNTER — Telehealth: Payer: Self-pay | Admitting: Nurse Practitioner

## 2014-12-22 ENCOUNTER — Ambulatory Visit (HOSPITAL_COMMUNITY)
Admission: RE | Admit: 2014-12-22 | Discharge: 2014-12-22 | Disposition: A | Discharge status: Home / Self Care | Payer: Medicare Other | Source: Ambulatory Visit | Attending: Oncology | Admitting: Oncology

## 2014-12-22 ENCOUNTER — Ambulatory Visit: Payer: Medicare Other

## 2014-12-22 DIAGNOSIS — D649 Anemia, unspecified: Secondary | ICD-10-CM

## 2014-12-22 DIAGNOSIS — Z5112 Encounter for antineoplastic immunotherapy: Secondary | ICD-10-CM | POA: Diagnosis not present

## 2014-12-22 DIAGNOSIS — C9192 Lymphoid leukemia, unspecified, in relapse: Secondary | ICD-10-CM

## 2014-12-22 DIAGNOSIS — C9112 Chronic lymphocytic leukemia of B-cell type in relapse: Secondary | ICD-10-CM

## 2014-12-22 DIAGNOSIS — C50912 Malignant neoplasm of unspecified site of left female breast: Secondary | ICD-10-CM

## 2014-12-22 DIAGNOSIS — Z95828 Presence of other vascular implants and grafts: Secondary | ICD-10-CM

## 2014-12-22 DIAGNOSIS — D61818 Other pancytopenia: Secondary | ICD-10-CM

## 2014-12-22 DIAGNOSIS — C8590 Non-Hodgkin lymphoma, unspecified, unspecified site: Secondary | ICD-10-CM

## 2014-12-22 DIAGNOSIS — R5383 Other fatigue: Secondary | ICD-10-CM

## 2014-12-22 LAB — CBC WITH DIFFERENTIAL/PLATELET
BASO%: 0 % (ref 0.0–2.0)
BASOS ABS: 0 10*3/uL (ref 0.0–0.1)
EOS%: 3 % (ref 0.0–7.0)
Eosinophils Absolute: 0.4 10*3/uL (ref 0.0–0.5)
HCT: 22.3 % — ABNORMAL LOW (ref 34.8–46.6)
HEMOGLOBIN: 7.3 g/dL — AB (ref 11.6–15.9)
LYMPH%: 89.9 % — ABNORMAL HIGH (ref 14.0–49.7)
MCH: 35.6 pg — ABNORMAL HIGH (ref 25.1–34.0)
MCHC: 32.7 g/dL (ref 31.5–36.0)
MCV: 108.8 fL — AB (ref 79.5–101.0)
MONO#: 0.4 10*3/uL (ref 0.1–0.9)
MONO%: 2.9 % (ref 0.0–14.0)
NEUT#: 0.6 10*3/uL — ABNORMAL LOW (ref 1.5–6.5)
NEUT%: 4.2 % — ABNORMAL LOW (ref 38.4–76.8)
Platelets: 49 10*3/uL — ABNORMAL LOW (ref 145–400)
RBC: 2.05 10*6/uL — AB (ref 3.70–5.45)
RDW: 27.1 % — AB (ref 11.2–14.5)
WBC: 14.8 10*3/uL — ABNORMAL HIGH (ref 3.9–10.3)
lymph#: 13.3 10*3/uL — ABNORMAL HIGH (ref 0.9–3.3)

## 2014-12-22 LAB — COMPREHENSIVE METABOLIC PANEL (CC13)
ALT: 90 U/L — AB (ref 0–55)
AST: 61 U/L — AB (ref 5–34)
Albumin: 3.3 g/dL — ABNORMAL LOW (ref 3.5–5.0)
Alkaline Phosphatase: 66 U/L (ref 40–150)
Anion Gap: 10 mEq/L (ref 3–11)
BUN: 16.7 mg/dL (ref 7.0–26.0)
CALCIUM: 8.4 mg/dL (ref 8.4–10.4)
CHLORIDE: 105 meq/L (ref 98–109)
CO2: 21 mEq/L — ABNORMAL LOW (ref 22–29)
Creatinine: 1.1 mg/dL (ref 0.6–1.1)
EGFR: 54 mL/min/{1.73_m2} — ABNORMAL LOW (ref >90)
Glucose: 142 mg/dl — ABNORMAL HIGH (ref 70–140)
Potassium: 4 mEq/L (ref 3.5–5.1)
Sodium: 137 mEq/L (ref 136–145)
TOTAL PROTEIN: 5.3 g/dL — AB (ref 6.4–8.3)
Total Bilirubin: 0.41 mg/dL (ref 0.20–1.20)

## 2014-12-22 LAB — TECHNOLOGIST REVIEW

## 2014-12-22 LAB — LACTATE DEHYDROGENASE (CC13): LDH: 281 U/L — AB (ref 125–245)

## 2014-12-22 LAB — PREPARE RBC (CROSSMATCH)

## 2014-12-22 LAB — URIC ACID (CC13): Uric Acid, Serum: 5.8 mg/dl (ref 2.6–7.4)

## 2014-12-22 MED ORDER — HEPARIN SOD (PORK) LOCK FLUSH 100 UNIT/ML IV SOLN
500.0000 [IU] | Freq: Once | INTRAVENOUS | Status: AC | PRN
  Administered 2014-12-22: 500 [IU]
  Filled 2014-12-22: qty 5

## 2014-12-22 MED ORDER — DIPHENHYDRAMINE HCL 25 MG PO CAPS
50.0000 mg | ORAL_CAPSULE | Freq: Once | ORAL | Status: AC
  Administered 2014-12-22: 50 mg via ORAL

## 2014-12-22 MED ORDER — ACETAMINOPHEN 325 MG PO TABS
650.0000 mg | ORAL_TABLET | Freq: Once | ORAL | Status: AC
  Administered 2014-12-22: 650 mg via ORAL

## 2014-12-22 MED ORDER — SODIUM CHLORIDE 0.9 % IJ SOLN
10.0000 mL | INTRAMUSCULAR | Status: DC | PRN
  Administered 2014-12-22: 10 mL
  Filled 2014-12-22: qty 10

## 2014-12-22 MED ORDER — DIPHENHYDRAMINE HCL 25 MG PO CAPS
ORAL_CAPSULE | ORAL | Status: AC
  Filled 2014-12-22: qty 2

## 2014-12-22 MED ORDER — SODIUM CHLORIDE 0.9 % IJ SOLN
10.0000 mL | INTRAMUSCULAR | Status: DC | PRN
  Administered 2014-12-22: 10 mL via INTRAVENOUS
  Filled 2014-12-22: qty 10

## 2014-12-22 MED ORDER — SODIUM CHLORIDE 0.9 % IV SOLN
Freq: Once | INTRAVENOUS | Status: AC
  Administered 2014-12-22: 09:00:00 via INTRAVENOUS

## 2014-12-22 MED ORDER — HEPARIN SOD (PORK) LOCK FLUSH 100 UNIT/ML IV SOLN
250.0000 [IU] | INTRAVENOUS | Status: DC | PRN
  Filled 2014-12-22: qty 5

## 2014-12-22 MED ORDER — ACETAMINOPHEN 325 MG PO TABS
ORAL_TABLET | ORAL | Status: AC
  Filled 2014-12-22: qty 2

## 2014-12-22 MED ORDER — SODIUM CHLORIDE 0.9 % IV SOLN
375.0000 mg/m2 | Freq: Once | INTRAVENOUS | Status: AC
  Administered 2014-12-22: 800 mg via INTRAVENOUS
  Filled 2014-12-22: qty 80

## 2014-12-22 NOTE — Patient Instructions (Signed)

## 2014-12-22 NOTE — Progress Notes (Signed)
[name] OFFICE PROGRESS NOTE   Diagnosis:  CLL  INTERVAL HISTORY:   [name] returns as scheduled. Sweats continue to be improved. No fever. Her appetite is better. Lymph nodes continue to be smaller. She has mild occasional nausea. No vomiting. No diarrhea. No skin rash. She reports a "dry cough" for the past several weeks. The cough is unchanged. No shortness of breath.  Objective:  Vital signs in last 24 hours:  Temperature 98.6 heart rate 56 respirations 16 blood pressure 131/42    HEENT: No thrush or ulcers. Lymphatics: No neck adenopathy. Several small left axillary lymph nodes. No significant right axillary adenopathy. Resp: Lungs clear bilaterally. Cardio: Regular rate and rhythm. GI: Abdomen is soft and nontender. No organomegaly. Vascular: No leg edema. Port-A-Cath without erythema.   Lab Results:  Lab Results  Component Value Date   WBC 14.8* 12/22/2014   HGB 7.3* 12/22/2014   HCT 22.3* 12/22/2014   MCV 108.8* 12/22/2014   PLT 49* 12/22/2014   NEUTROABS 0.6* 12/22/2014    Imaging:  No results found.  Medications: I have reviewed the patient's current medications.  Assessment/Plan: 1. Relapsed CLL/well-differentiated lymphocytic lymphoma. Initial diagnosis dates to November 2009. Initially treated with modified fludarabine, Cytoxan and Rituxan with no response. No response to Campath. Partial response to Arzerra initially given between April and June 2010 and monthly through October 2010. In May 2013 there was deterioration of the blood counts. Arzerra was resumed and she again had a partial response. Blood counts began to fall again in August 2014. She began a trial of Ibrutinib 05/27/2013. It was stopped on 06/10/2013 due to profound fatigue, nausea and anorexia. In addition, labs on 06/10/2013 showed significant deterioration in blood counts with the platelet count down to 3000, felt to be secondary to disease progression as this was how  she initially presented. Treatment was initiated with Obinutuzumab and Leukeran on 06/23/2013. Blood counts improved. Obinutuzumab discontinued following cycle 3 due to development of chest tightness requiring hospitalization. She ruled out for an MI. Low dose daily chlorambucil continued until 01/09/2014 when it was placed on hold due to progressive neutropenia/thrombocytopenia.  Bone marrow biopsy 09/06/2014 confirmed involvement with CLL  Initiation of Rituxan/Idelalisib 12/01/2014 2. Extreme fatigue, anorexia while on Ibrutinib. 3. Multifocal invasive and noninvasive cancers left breast status post mastectomy 10/09/2010 (mpT2,mpN0) currently on tamoxifen. 4. Hypertension. 5. Recurrent atypical neurologic symptoms with dizziness, vertigo, paresthesias. 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 Mild changes in small vessels. No acute changes. Aspirin placed on hold due to severe thrombocytopenia. 6. Pancytopenia secondary to CLL involving the bone marrow. 7. Elevated creatinine (1.7) 11/21/2014. Renal ultrasound 11/24/2014 showed mild left hydronephrosis. No renal calculi identified. CT scans 12/08/2014 showed marked progression of lymphadenopathy in the abdomen and pelvis. Kidneys and ureters appeared normal.   Disposition: [name] appears stable. She will continue idelalisib. Plan to proceed with cycle 2 Rituxan today as scheduled.   The neutrophil count is better. She will continue prophylactic ciprofloxacin for now. We will repeat a CBC in one week. If the neutrophil count is higher the ciprofloxacin will be discontinued.  She has progressive anemia. She will receive 2 units of blood today following the Rituxan.  We will continue weekly labs and see her in follow-up in 2 weeks. She understands to contact the office in the interim with fever, chills, other signs of infection, worsening cough.  Plan reviewed with Dr. Benay Spice.  Ned Card ANP/GNP-BC  12/22/2014  9:14 AM

## 2014-12-22 NOTE — Telephone Encounter (Signed)
Pt confirmed labs/ov per 03/25 POF,gave pt AVS and calendar.... KJ

## 2014-12-22 NOTE — Patient Instructions (Signed)
[facility] Discharge Instructions for Patients Receiving Chemotherapy  Today you received the following chemotherapy agents Rituxan  To help prevent nausea and vomiting after your treatment, we encourage you to take your nausea medication     If you develop nausea and vomiting that is not controlled by your nausea medication, call the clinic.   BELOW ARE SYMPTOMS THAT SHOULD BE REPORTED IMMEDIATELY:  *FEVER GREATER THAN 100.5 F  *CHILLS WITH OR WITHOUT FEVER  NAUSEA AND VOMITING THAT IS NOT CONTROLLED WITH YOUR NAUSEA MEDICATION  *UNUSUAL SHORTNESS OF BREATH  *UNUSUAL BRUISING OR BLEEDING  TENDERNESS IN MOUTH AND THROAT WITH OR WITHOUT PRESENCE OF ULCERS  *URINARY PROBLEMS  *BOWEL PROBLEMS  UNUSUAL RASH Items with * indicate a potential emergency and should be followed up as soon as possible.  Feel free to call the clinic you have any questions or concerns. The clinic phone number is [phone].  Please show the [facility] at check-in to the Emergency Department and triage nurse.    Blood Transfusion  A blood transfusion replaces your blood or some of its parts. Blood is replaced when you have lost blood because of surgery, an accident, or for severe blood conditions like anemia. You can donate blood to be used on yourself if you have a planned surgery. If you lose blood during that surgery, your own blood can be given back to you. Any blood given to you is checked to make sure it matches your blood type. Your temperature, blood pressure, and heart rate (vital signs) will be checked often.  GET HELP RIGHT AWAY IF:   You feel sick to your stomach (nauseous) or throw up (vomit).  You have watery poop (diarrhea).  You have shortness of breath or trouble breathing.  You have blood in your pee (urine) or have dark colored pee.  You have chest pain or tightness.  Your eyes or skin turn yellow (jaundice).  You have a temperature by mouth above  102 F (38.9 C), not controlled by medicine.  You start to shake and have chills.  You develop a a red rash (hives) or feel itchy.  You develop lightheadedness or feel confused.  You develop back, joint, or muscle pain.  You do not feel hungry (lost appetite).  You feel tired, restless, or nervous.  You develop belly (abdominal) cramps. Document Released: 12/12/2008 Document Revised: 12/08/2011 Document Reviewed: 12/12/2008 [facility] Patient Information 2015 [place], [place]. This information is not intended to replace advice given to you by your health care provider. Make sure you discuss any questions you have with your health care provider.

## 2014-12-23 LAB — TYPE AND SCREEN
ABO/RH(D): A POS
Antibody Screen: NEGATIVE
UNIT DIVISION: 0
Unit division: 0

## 2014-12-27 ENCOUNTER — Other Ambulatory Visit: Payer: Self-pay | Admitting: Nurse Practitioner

## 2015-01-04 ENCOUNTER — Telehealth: Payer: Self-pay | Admitting: *Deleted

## 2015-01-04 ENCOUNTER — Other Ambulatory Visit (HOSPITAL_BASED_OUTPATIENT_CLINIC_OR_DEPARTMENT_OTHER): Payer: Medicare Other

## 2015-01-04 ENCOUNTER — Telehealth: Payer: Self-pay | Admitting: Oncology

## 2015-01-04 ENCOUNTER — Ambulatory Visit (HOSPITAL_BASED_OUTPATIENT_CLINIC_OR_DEPARTMENT_OTHER): Payer: Medicare Other | Admitting: Nurse Practitioner

## 2015-01-04 ENCOUNTER — Ambulatory Visit (HOSPITAL_BASED_OUTPATIENT_CLINIC_OR_DEPARTMENT_OTHER): Payer: Medicare Other

## 2015-01-04 VITALS — BP 155/52 | HR 77 | Temp 98.6°F | Resp 18 | Ht 63.0 in | Wt 210.6 lb

## 2015-01-04 DIAGNOSIS — C9112 Chronic lymphocytic leukemia of B-cell type in relapse: Secondary | ICD-10-CM

## 2015-01-04 DIAGNOSIS — C50412 Malignant neoplasm of upper-outer quadrant of left female breast: Secondary | ICD-10-CM

## 2015-01-04 DIAGNOSIS — D709 Neutropenia, unspecified: Secondary | ICD-10-CM

## 2015-01-04 DIAGNOSIS — C9192 Lymphoid leukemia, unspecified, in relapse: Secondary | ICD-10-CM

## 2015-01-04 DIAGNOSIS — Z95828 Presence of other vascular implants and grafts: Secondary | ICD-10-CM

## 2015-01-04 DIAGNOSIS — I10 Essential (primary) hypertension: Secondary | ICD-10-CM

## 2015-01-04 LAB — COMPREHENSIVE METABOLIC PANEL (CC13)
ALBUMIN: 3.6 g/dL (ref 3.5–5.0)
ALT: 54 U/L (ref 0–55)
ANION GAP: 11 meq/L (ref 3–11)
AST: 39 U/L — AB (ref 5–34)
Alkaline Phosphatase: 72 U/L (ref 40–150)
BUN: 13.8 mg/dL (ref 7.0–26.0)
CALCIUM: 8.7 mg/dL (ref 8.4–10.4)
CO2: 23 meq/L (ref 22–29)
CREATININE: 1.1 mg/dL (ref 0.6–1.1)
Chloride: 103 mEq/L (ref 98–109)
EGFR: 53 mL/min/{1.73_m2} — ABNORMAL LOW (ref >90)
Glucose: 98 mg/dl (ref 70–140)
POTASSIUM: 4 meq/L (ref 3.5–5.1)
Sodium: 136 mEq/L (ref 136–145)
Total Bilirubin: 1.42 mg/dL — ABNORMAL HIGH (ref 0.20–1.20)
Total Protein: 5.8 g/dL — ABNORMAL LOW (ref 6.4–8.3)

## 2015-01-04 LAB — HOLD TUBE, BLOOD BANK

## 2015-01-04 LAB — CBC WITH DIFFERENTIAL/PLATELET
BASO%: 0.7 % (ref 0.0–2.0)
Basophils Absolute: 0 10*3/uL (ref 0.0–0.1)
EOS ABS: 0 10*3/uL (ref 0.0–0.5)
EOS%: 0.7 % (ref 0.0–7.0)
HCT: 30.7 % — ABNORMAL LOW (ref 34.8–46.6)
HGB: 10.3 g/dL — ABNORMAL LOW (ref 11.6–15.9)
LYMPH%: 89.6 % — ABNORMAL HIGH (ref 14.0–49.7)
MCH: 33.5 pg (ref 25.1–34.0)
MCHC: 33.5 g/dL (ref 31.5–36.0)
MCV: 99.9 fL (ref 79.5–101.0)
MONO#: 0.2 10*3/uL (ref 0.1–0.9)
MONO%: 4 % (ref 0.0–14.0)
NEUT%: 5 % — ABNORMAL LOW (ref 38.4–76.8)
NEUTROS ABS: 0.2 10*3/uL — AB (ref 1.5–6.5)
Platelets: 49 10*3/uL — ABNORMAL LOW (ref 145–400)
RBC: 3.07 10*6/uL — AB (ref 3.70–5.45)
RDW: 27.5 % — AB (ref 11.2–14.5)
WBC: 4.5 10*3/uL (ref 3.9–10.3)
lymph#: 4.1 10*3/uL — ABNORMAL HIGH (ref 0.9–3.3)

## 2015-01-04 LAB — LACTATE DEHYDROGENASE (CC13): LDH: 390 U/L — ABNORMAL HIGH (ref 125–245)

## 2015-01-04 MED ORDER — SODIUM CHLORIDE 0.9 % IJ SOLN
10.0000 mL | INTRAMUSCULAR | Status: DC | PRN
  Administered 2015-01-04: 10 mL via INTRAVENOUS
  Filled 2015-01-04: qty 10

## 2015-01-04 MED ORDER — IDELALISIB 100 MG PO TABS: 100.0000 mg | ORAL_TABLET | Freq: Every day | ORAL | Status: DC

## 2015-01-04 MED ORDER — HEPARIN SOD (PORK) LOCK FLUSH 100 UNIT/ML IV SOLN
500.0000 [IU] | Freq: Once | INTRAVENOUS | Status: AC
  Administered 2015-01-04: 500 [IU] via INTRAVENOUS
  Filled 2015-01-04: qty 5

## 2015-01-04 MED ORDER — LIDOCAINE-PRILOCAINE 2.5-2.5 % EX CREA: 1.0000 "application " | TOPICAL_CREAM | CUTANEOUS | Status: DC | PRN

## 2015-01-04 NOTE — Patient Instructions (Signed)

## 2015-01-04 NOTE — Progress Notes (Signed)
[place] OFFICE PROGRESS NOTE   Diagnosis:  CLL  INTERVAL HISTORY:   [name] returns as scheduled. She completed cycle 2 Rituxan on 12/22/2014. She reports tolerating the infusion well. She "ran out" of Idelalisib one week ago. Sweats and lymph nodes continue to be improved. No fever. Appetite varies. She has stable dyspnea on exertion. The cough she complained about 2 weeks ago has resolved. No diarrhea. She has intermittent constipation. She takes a stool softener as needed.  Objective:  Vital signs in last 24 hours:  Blood pressure 155/52, pulse 77, temperature 98.6 F (37 C), temperature source Oral, resp. rate 18, height 5' 3"  (1.6 m), weight 210 lb 9.6 oz (95.528 kg), SpO2 100 %.    HEENT: No thrush or ulcers. Lymphatics: No neck adenopathy. Several mobile approximate 1 cm right axillary lymph nodes. One to 1/2 cm left axillary lymph node. No inguinal nodes. Resp: Lungs clear bilaterally. Cardio: Regular rate and rhythm. GI: Abdomen soft and nontender. No organomegaly. Vascular: No leg edema. Port-A-Cath without erythema.   Lab Results:  Lab Results  Component Value Date   WBC 4.5 01/04/2015   HGB 10.3* 01/04/2015   HCT 30.7* 01/04/2015   MCV 99.9 01/04/2015   PLT 49* 01/04/2015   NEUTROABS 0.2* 01/04/2015    Imaging:  No results found.  Medications: I have reviewed the patient's current medications.  Assessment/Plan: 1. Relapsed CLL/well-differentiated lymphocytic lymphoma. Initial diagnosis dates to November 2009. Initially treated with modified fludarabine, Cytoxan and Rituxan with no response. No response to Campath. Partial response to Arzerra initially given between April and June 2010 and monthly through October 2010. In May 2013 there was deterioration of the blood counts. Arzerra was resumed and she again had a partial response. Blood counts began to fall again in August 2014. She began a trial of Ibrutinib 05/27/2013. It was stopped on  06/10/2013 due to profound fatigue, nausea and anorexia. In addition, labs on 06/10/2013 showed significant deterioration in blood counts with the platelet count down to 3000, felt to be secondary to disease progression as this was how she initially presented. Treatment was initiated with Obinutuzumab and Leukeran on 06/23/2013. Blood counts improved. Obinutuzumab discontinued following cycle 3 due to development of chest tightness requiring hospitalization. She ruled out for an MI. Low dose daily chlorambucil continued until 01/09/2014 when it was placed on hold due to progressive neutropenia/thrombocytopenia.  Bone marrow biopsy 09/06/2014 confirmed involvement with CLL  Initiation of Rituxan/Idelalisib 12/01/2014  Cycle 2 Rituxan 12/22/2014 2. Extreme fatigue, anorexia while on Ibrutinib. 3. Multifocal invasive and noninvasive cancers left breast status post mastectomy 10/09/2010 (mpT2,mpN0) currently on tamoxifen. 4. Hypertension. 5. Recurrent atypical neurologic symptoms with dizziness, vertigo, paresthesias. 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 Mild changes in small vessels. No acute changes. Aspirin placed on hold due to severe thrombocytopenia. 6. Pancytopenia secondary to CLL involving the bone marrow. 7. Elevated creatinine (1.7) 11/21/2014. Renal ultrasound 11/24/2014 showed mild left hydronephrosis. No renal calculi identified. CT scans 12/08/2014 showed marked progression of lymphadenopathy in the abdomen and pelvis. Kidneys and ureters appeared normal.   Disposition: [name] appears stable. Plan to continue Idelalisib and Rituxan. The sweats have improved and peripheral adenopathy is better. She remains significantly neutropenic and will continue prophylactic Cipro. She understands to contact the office with fever, chills, other signs of infection. We will continue to monitor blood counts on a weekly basis. She will return for cycle 3 Rituxan on  01/12/2015. We will see her in follow-up in 2 weeks. She will contact the office in the interim as outlined above or with any other problems.    Ned Card ANP/GNP-BC   01/04/2015  12:52 PM

## 2015-01-04 NOTE — Telephone Encounter (Signed)
Gave patient avs report and appointments for April  °

## 2015-01-04 NOTE — Telephone Encounter (Signed)
Late entry for 1211: Received critical [place]. Results given to Ned Card, NP as pt was being seen in office. This has been a stable value for pt.

## 2015-01-05 ENCOUNTER — Encounter: Payer: Self-pay | Admitting: *Deleted

## 2015-01-05 ENCOUNTER — Ambulatory Visit: Payer: Medicare Other | Admitting: Nurse Practitioner

## 2015-01-05 ENCOUNTER — Other Ambulatory Visit: Payer: Medicare Other

## 2015-01-05 NOTE — Progress Notes (Signed)
[place] Work  Clinical Social Work was referred by nurse for assessment of psychosocial needs due to request for "help at home".  Clinical Social Worker contacted patient's daughter as that was number provided to offer support and assess for needs. CSW introduced self and explained role of CSW. Per daughter, pt requested help vacuuming and cleaning some at home. Daughter was unaware of this request prior to appt the other day. Family feels they can meet this need currently. They report she gets assistance through them for transportation and meals as well. Daughter feels they can manage her needs currently and declines further assistance. CSW provided number and will come meet pt and daughter at appt on 4/14. Daughter appreciated call and support.    Clinical Social Work interventions: Resource education Supportive listening  [name], [place] Worker [place]  [place] Phone: [phone] Fax: [phone]

## 2015-01-07 ENCOUNTER — Other Ambulatory Visit: Payer: Self-pay | Admitting: Oncology

## 2015-01-12 ENCOUNTER — Other Ambulatory Visit (HOSPITAL_BASED_OUTPATIENT_CLINIC_OR_DEPARTMENT_OTHER): Payer: Medicare Other

## 2015-01-12 ENCOUNTER — Ambulatory Visit (HOSPITAL_BASED_OUTPATIENT_CLINIC_OR_DEPARTMENT_OTHER): Payer: Medicare Other

## 2015-01-12 ENCOUNTER — Encounter: Payer: Self-pay | Admitting: *Deleted

## 2015-01-12 ENCOUNTER — Telehealth: Payer: Self-pay | Admitting: *Deleted

## 2015-01-12 ENCOUNTER — Other Ambulatory Visit (HOSPITAL_COMMUNITY)
Admission: RE | Admit: 2015-01-12 | Discharge: 2015-01-12 | Disposition: A | Discharge status: Home / Self Care | Payer: Medicare Other | Source: Ambulatory Visit | Attending: Oncology | Admitting: Oncology

## 2015-01-12 ENCOUNTER — Ambulatory Visit: Payer: Medicare Other

## 2015-01-12 VITALS — BP 130/52 | HR 60 | Temp 98.0°F | Resp 18

## 2015-01-12 DIAGNOSIS — C9112 Chronic lymphocytic leukemia of B-cell type in relapse: Secondary | ICD-10-CM | POA: Insufficient documentation

## 2015-01-12 DIAGNOSIS — C8590 Non-Hodgkin lymphoma, unspecified, unspecified site: Secondary | ICD-10-CM

## 2015-01-12 DIAGNOSIS — Z95828 Presence of other vascular implants and grafts: Secondary | ICD-10-CM

## 2015-01-12 DIAGNOSIS — C9110 Chronic lymphocytic leukemia of B-cell type not having achieved remission: Secondary | ICD-10-CM

## 2015-01-12 DIAGNOSIS — Z5112 Encounter for antineoplastic immunotherapy: Secondary | ICD-10-CM | POA: Diagnosis not present

## 2015-01-12 DIAGNOSIS — C9192 Lymphoid leukemia, unspecified, in relapse: Secondary | ICD-10-CM

## 2015-01-12 LAB — COMPREHENSIVE METABOLIC PANEL
ALBUMIN: 3.6 g/dL (ref 3.5–5.2)
ALT: 58 U/L — ABNORMAL HIGH (ref 0–35)
AST: 47 U/L — AB (ref 0–37)
Alkaline Phosphatase: 57 U/L (ref 39–117)
Anion gap: 7 (ref 5–15)
BUN: 16 mg/dL (ref 6–23)
CO2: 24 mmol/L (ref 19–32)
Calcium: 8.6 mg/dL (ref 8.4–10.5)
Chloride: 105 mmol/L (ref 96–112)
Creatinine, Ser: 1.1 mg/dL (ref 0.50–1.10)
GFR calc Af Amer: 54 mL/min — ABNORMAL LOW (ref >90)
GFR, EST NON AFRICAN AMERICAN: 47 mL/min — AB (ref >90)
Glucose, Bld: 149 mg/dL — ABNORMAL HIGH (ref 70–99)
Potassium: 3.8 mmol/L (ref 3.5–5.1)
Sodium: 136 mmol/L (ref 135–145)
Total Bilirubin: 0.5 mg/dL (ref 0.3–1.2)
Total Protein: 5.7 g/dL — ABNORMAL LOW (ref 6.0–8.3)

## 2015-01-12 LAB — MANUAL DIFFERENTIAL
ALC: 12.2 10*3/uL — ABNORMAL HIGH (ref 0.9–3.3)
ANC (CHCC manual diff): 0.3 10*3/uL — CL (ref 1.5–6.5)
Band Neutrophils: 0 % (ref 0–10)
Basophil: 0 % (ref 0–2)
Blasts: 0 % (ref 0–0)
EOS%: 0 % (ref 0–7)
LYMPH: 96 % — ABNORMAL HIGH (ref 14–49)
MONO: 2 % (ref 0–14)
MYELOCYTES: 0 % (ref 0–0)
Metamyelocytes: 0 % (ref 0–0)
OTHER CELL: 0 % (ref 0–0)
PLT EST: DECREASED
PROMYELO: 0 % (ref 0–0)
SEG: 2 % — AB (ref 38–77)
Variant Lymph: 0 % (ref 0–0)
nRBC: 0 % (ref 0–0)

## 2015-01-12 LAB — CBC WITH DIFFERENTIAL/PLATELET
HCT: 27.8 % — ABNORMAL LOW (ref 34.8–46.6)
HGB: 9 g/dL — ABNORMAL LOW (ref 11.6–15.9)
MCH: 33 pg (ref 25.1–34.0)
MCHC: 32.4 g/dL (ref 31.5–36.0)
MCV: 101.8 fL — ABNORMAL HIGH (ref 79.5–101.0)
PLATELETS: 60 10*3/uL — AB (ref 145–400)
RBC: 2.73 10*6/uL — ABNORMAL LOW (ref 3.70–5.45)
RDW: 27.6 % — ABNORMAL HIGH (ref 11.2–14.5)
WBC: 12.7 10*3/uL — ABNORMAL HIGH (ref 3.9–10.3)

## 2015-01-12 LAB — LACTATE DEHYDROGENASE (CC13): LDH: 301 U/L — ABNORMAL HIGH (ref 125–245)

## 2015-01-12 MED ORDER — SODIUM CHLORIDE 0.9 % IV SOLN
375.0000 mg/m2 | Freq: Once | INTRAVENOUS | Status: AC
  Administered 2015-01-12: 800 mg via INTRAVENOUS
  Filled 2015-01-12: qty 80

## 2015-01-12 MED ORDER — ACETAMINOPHEN 325 MG PO TABS
650.0000 mg | ORAL_TABLET | Freq: Once | ORAL | Status: AC
  Administered 2015-01-12: 650 mg via ORAL

## 2015-01-12 MED ORDER — SODIUM CHLORIDE 0.9 % IV SOLN
Freq: Once | INTRAVENOUS | Status: AC
  Administered 2015-01-12: 10:00:00 via INTRAVENOUS

## 2015-01-12 MED ORDER — HEPARIN SOD (PORK) LOCK FLUSH 100 UNIT/ML IV SOLN
500.0000 [IU] | Freq: Once | INTRAVENOUS | Status: AC | PRN
  Administered 2015-01-12: 500 [IU]
  Filled 2015-01-12: qty 5

## 2015-01-12 MED ORDER — ACETAMINOPHEN 325 MG PO TABS
ORAL_TABLET | ORAL | Status: AC
  Filled 2015-01-12: qty 2

## 2015-01-12 MED ORDER — SODIUM CHLORIDE 0.9 % IJ SOLN
10.0000 mL | INTRAMUSCULAR | Status: DC | PRN
  Administered 2015-01-12: 10 mL via INTRAVENOUS
  Filled 2015-01-12: qty 10

## 2015-01-12 MED ORDER — DIPHENHYDRAMINE HCL 25 MG PO CAPS
ORAL_CAPSULE | ORAL | Status: AC
  Filled 2015-01-12: qty 2

## 2015-01-12 MED ORDER — SODIUM CHLORIDE 0.9 % IJ SOLN
10.0000 mL | INTRAMUSCULAR | Status: DC | PRN
  Administered 2015-01-12: 10 mL
  Filled 2015-01-12: qty 10

## 2015-01-12 MED ORDER — DIPHENHYDRAMINE HCL 25 MG PO CAPS
50.0000 mg | ORAL_CAPSULE | Freq: Once | ORAL | Status: AC
  Administered 2015-01-12: 50 mg via ORAL

## 2015-01-12 NOTE — Progress Notes (Signed)
[place] Work  Holiday representative met with patient at [facility] during her infusion to introduce self and to offer support and assess for needs.  Pt lives alone, but has good support from her daughters; Baker Janus and Katharine Look. They both work full-time and pt struggles with asking for help. CSW explained levels of care and what insurance would cover/not cover. Pt stated understanding and is open to PT consult through [facility]. CSW shared need with RN, Lavella Lemons and she agreed to assist. CSW to follow as needs arise.   Clinical Social Work interventions: Resource education Supportive listening  Loren Racer, [place] Worker [place]  [place] Phone: [phone] Fax: [phone]

## 2015-01-12 NOTE — Telephone Encounter (Signed)
Received critical [place] from lab. Dr. Benay Spice reviewed CBC. Pt to receive Rituxan today as scheduled, per Dr. Benay Spice.

## 2015-01-12 NOTE — Patient Instructions (Signed)
[facility] Discharge Instructions for Patients Receiving Chemotherapy  Today you received the following chemotherapy agents rituxan.   To help prevent nausea and vomiting after your treatment, we encourage you to take your nausea medication as directed.     If you develop nausea and vomiting that is not controlled by your nausea medication, call the clinic.   BELOW ARE SYMPTOMS THAT SHOULD BE REPORTED IMMEDIATELY:  *FEVER GREATER THAN 100.5 F  *CHILLS WITH OR WITHOUT FEVER  NAUSEA AND VOMITING THAT IS NOT CONTROLLED WITH YOUR NAUSEA MEDICATION  *UNUSUAL SHORTNESS OF BREATH  *UNUSUAL BRUISING OR BLEEDING  TENDERNESS IN MOUTH AND THROAT WITH OR WITHOUT PRESENCE OF ULCERS  *URINARY PROBLEMS  *BOWEL PROBLEMS  UNUSUAL RASH Items with * indicate a potential emergency and should be followed up as soon as possible.  Feel free to call the clinic you have any questions or concerns. The clinic phone number is [phone].  

## 2015-01-12 NOTE — Progress Notes (Signed)
Per office note, pt remains consistently neutropenic, ok to proceed with rituxan.

## 2015-01-12 NOTE — Patient Instructions (Signed)

## 2015-01-19 ENCOUNTER — Ambulatory Visit (HOSPITAL_BASED_OUTPATIENT_CLINIC_OR_DEPARTMENT_OTHER): Payer: Medicare Other | Admitting: Oncology

## 2015-01-19 ENCOUNTER — Telehealth: Payer: Self-pay | Admitting: Oncology

## 2015-01-19 ENCOUNTER — Ambulatory Visit (HOSPITAL_BASED_OUTPATIENT_CLINIC_OR_DEPARTMENT_OTHER): Payer: Medicare Other

## 2015-01-19 ENCOUNTER — Other Ambulatory Visit (HOSPITAL_BASED_OUTPATIENT_CLINIC_OR_DEPARTMENT_OTHER): Payer: Medicare Other

## 2015-01-19 VITALS — BP 157/47 | HR 73 | Temp 98.6°F | Resp 19 | Ht 63.0 in | Wt 212.1 lb

## 2015-01-19 DIAGNOSIS — C9112 Chronic lymphocytic leukemia of B-cell type in relapse: Secondary | ICD-10-CM

## 2015-01-19 DIAGNOSIS — S3091XA Unspecified superficial injury of lower back and pelvis, initial encounter: Secondary | ICD-10-CM

## 2015-01-19 DIAGNOSIS — C9192 Lymphoid leukemia, unspecified, in relapse: Secondary | ICD-10-CM

## 2015-01-19 DIAGNOSIS — Z95828 Presence of other vascular implants and grafts: Secondary | ICD-10-CM

## 2015-01-19 DIAGNOSIS — D61818 Other pancytopenia: Secondary | ICD-10-CM | POA: Diagnosis not present

## 2015-01-19 LAB — COMPREHENSIVE METABOLIC PANEL (CC13)
ALT: 30 U/L (ref 0–55)
AST: 24 U/L (ref 5–34)
Albumin: 3.6 g/dL (ref 3.5–5.0)
Alkaline Phosphatase: 67 U/L (ref 40–150)
Anion Gap: 13 mEq/L — ABNORMAL HIGH (ref 3–11)
BUN: 15.8 mg/dL (ref 7.0–26.0)
CHLORIDE: 104 meq/L (ref 98–109)
CO2: 21 meq/L — AB (ref 22–29)
Calcium: 8.9 mg/dL (ref 8.4–10.4)
Creatinine: 1.1 mg/dL (ref 0.6–1.1)
EGFR: 53 mL/min/{1.73_m2} — AB (ref >90)
Glucose: 144 mg/dl — ABNORMAL HIGH (ref 70–140)
POTASSIUM: 3.8 meq/L (ref 3.5–5.1)
SODIUM: 137 meq/L (ref 136–145)
Total Bilirubin: 1.12 mg/dL (ref 0.20–1.20)
Total Protein: 5.8 g/dL — ABNORMAL LOW (ref 6.4–8.3)

## 2015-01-19 LAB — CBC WITH DIFFERENTIAL/PLATELET
BASO%: 0.3 % (ref 0.0–2.0)
BASOS ABS: 0 10*3/uL (ref 0.0–0.1)
EOS ABS: 0 10*3/uL (ref 0.0–0.5)
EOS%: 0.7 % (ref 0.0–7.0)
HEMATOCRIT: 27.4 % — AB (ref 34.8–46.6)
HGB: 9 g/dL — ABNORMAL LOW (ref 11.6–15.9)
LYMPH%: 88.2 % — ABNORMAL HIGH (ref 14.0–49.7)
MCH: 33.5 pg (ref 25.1–34.0)
MCHC: 33 g/dL (ref 31.5–36.0)
MCV: 101.7 fL — ABNORMAL HIGH (ref 79.5–101.0)
MONO#: 0.3 10*3/uL (ref 0.1–0.9)
MONO%: 5.2 % (ref 0.0–14.0)
NEUT%: 5.6 % — ABNORMAL LOW (ref 38.4–76.8)
NEUTROS ABS: 0.3 10*3/uL — AB (ref 1.5–6.5)
Platelets: 57 10*3/uL — ABNORMAL LOW (ref 145–400)
RBC: 2.7 10*6/uL — ABNORMAL LOW (ref 3.70–5.45)
RDW: 28.4 % — AB (ref 11.2–14.5)
WBC: 5.3 10*3/uL (ref 3.9–10.3)
lymph#: 4.7 10*3/uL — ABNORMAL HIGH (ref 0.9–3.3)

## 2015-01-19 LAB — LACTATE DEHYDROGENASE (CC13): LDH: 443 U/L — AB (ref 125–245)

## 2015-01-19 LAB — TECHNOLOGIST REVIEW

## 2015-01-19 MED ORDER — HEPARIN SOD (PORK) LOCK FLUSH 100 UNIT/ML IV SOLN
500.0000 [IU] | Freq: Once | INTRAVENOUS | Status: AC
  Administered 2015-01-19: 500 [IU] via INTRAVENOUS
  Filled 2015-01-19: qty 5

## 2015-01-19 MED ORDER — DOXYCYCLINE HYCLATE 100 MG PO TABS: 100.0000 mg | ORAL_TABLET | Freq: Two times a day (BID) | ORAL | Status: DC

## 2015-01-19 MED ORDER — SODIUM CHLORIDE 0.9 % IJ SOLN
10.0000 mL | INTRAMUSCULAR | Status: DC | PRN
  Administered 2015-01-19: 10 mL via INTRAVENOUS
  Filled 2015-01-19: qty 10

## 2015-01-19 NOTE — Patient Instructions (Signed)

## 2015-01-19 NOTE — Telephone Encounter (Signed)
Gave avs & calendar for April. Sent message to schedule treatment. °

## 2015-01-19 NOTE — Progress Notes (Signed)
[facility] OFFICE PROGRESS NOTE   Diagnosis: CLL, pancytopenia  INTERVAL HISTORY:   [name] returns as scheduled. She was last treated with rituximab on 01/12/2015. She continues idelalisib. No diarrhea. She has developed a "rash "at the buttock. No fever. The sweats have improved. Her appetite has not normalized.  Objective:  Vital signs in last 24 hours:  Blood pressure 157/47, pulse 73, temperature 98.6 F (37 C), temperature source Oral, resp. rate 19, height 5' 3" (1.6 m), weight 212 lb 1.6 oz (96.208 kg), SpO2 100 %.    HEENT: No thrush or ulcers Lymphatics: No cervical or supraclavicular nodes. No right axillary nodes. Soft mobile 3 cm oblong fullness in the left axilla Resp: Lungs clear bilaterally Cardio: Regular rate and rhythm GI: No hepatosplenomegaly, nontender Vascular: No leg edema  Skin: Superficial ulceration at the upper gluteal fold without surrounding erythema or induration   Portacath/PICC-without erythema  Lab Results:  Lab Results  Component Value Date   WBC 5.3 01/19/2015   HGB 9.0* 01/19/2015   HCT 27.4* 01/19/2015   MCV 101.7* 01/19/2015   PLT 57* 01/19/2015   NEUTROABS 0.3* 01/19/2015    Medications: I have reviewed the patient's current medications.  Assessment/Plan: 1. Relapsed CLL/well-differentiated lymphocytic lymphoma. Initial diagnosis dates to November 2009. Initially treated with modified fludarabine, Cytoxan and Rituxan with no response. No response to Campath. Partial response to Arzerra initially given between April and June 2010 and monthly through October 2010. In May 2013 there was deterioration of the blood counts. Arzerra was resumed and she again had a partial response. Blood counts began to fall again in August 2014. She began a trial of Ibrutinib 05/27/2013. It was stopped on 06/10/2013 due to profound fatigue, nausea and anorexia. In addition, labs on 06/10/2013 showed significant deterioration in blood  counts with the platelet count down to 3000, felt to be secondary to disease progression as this was how she initially presented. Treatment was initiated with Obinutuzumab and Leukeran on 06/23/2013. Blood counts improved. Obinutuzumab discontinued following cycle 3 due to development of chest tightness requiring hospitalization. She ruled out for an MI. Low dose daily chlorambucil continued until 01/09/2014 when it was placed on hold due to progressive neutropenia/thrombocytopenia.  Bone marrow biopsy 09/06/2014 confirmed involvement with CLL  Initiation of Rituxan/Idelalisib 12/01/2014  Cycle 2 Rituxan 12/22/2014 2. Extreme fatigue, anorexia while on Ibrutinib. 3. Multifocal invasive and noninvasive cancers left breast status post mastectomy 10/09/2010 (mpT2,mpN0) currently on tamoxifen. 4. Hypertension. 5. Recurrent atypical neurologic symptoms with dizziness, vertigo, paresthesias. 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 Mild changes in small vessels. No acute changes. Aspirin placed on hold due to severe thrombocytopenia. 6. Pancytopenia secondary to CLL involving the bone marrow. 7. Elevated creatinine (1.7) 11/21/2014. Renal ultrasound 11/24/2014 showed mild left hydronephrosis. No renal calculi identified. CT scans 12/08/2014 showed marked progression of lymphadenopathy in the abdomen and pelvis. Kidneys and ureters appeared normal. 8. Superficial skin breakdown at the upper gluteal fold-we prescribed doxycycline. She will keep the area clean and dry. I recommended she avoid pressure on this area.  Disposition:  She appears to be tolerating the rituximab and idelalisib well. She will contact us for a fever. She will return for reassessment of the sacral skin breakdown neck week. She will be scheduled for another treatment with rituximab in 2 weeks.  She has persistent pancytopenia. The anemia has improved with treatment. Hopefully the neutrophils and  platelet count will improve over  the next few weeks.  , , MD  01/19/2015  11:32 AM    

## 2015-01-22 ENCOUNTER — Other Ambulatory Visit: Payer: Self-pay | Admitting: *Deleted

## 2015-01-22 DIAGNOSIS — C9112 Chronic lymphocytic leukemia of B-cell type in relapse: Secondary | ICD-10-CM

## 2015-01-22 DIAGNOSIS — C9192 Lymphoid leukemia, unspecified, in relapse: Secondary | ICD-10-CM

## 2015-01-22 NOTE — Progress Notes (Signed)
Home Health Nursing orders faxed to [facility].

## 2015-01-26 ENCOUNTER — Other Ambulatory Visit (HOSPITAL_BASED_OUTPATIENT_CLINIC_OR_DEPARTMENT_OTHER): Payer: Medicare Other

## 2015-01-26 ENCOUNTER — Telehealth: Payer: Self-pay | Admitting: Nurse Practitioner

## 2015-01-26 ENCOUNTER — Ambulatory Visit: Payer: Medicare Other

## 2015-01-26 ENCOUNTER — Ambulatory Visit (HOSPITAL_BASED_OUTPATIENT_CLINIC_OR_DEPARTMENT_OTHER): Payer: Medicare Other | Admitting: Nurse Practitioner

## 2015-01-26 VITALS — BP 132/41 | HR 70 | Temp 98.7°F | Resp 18 | Ht 63.0 in | Wt 210.2 lb

## 2015-01-26 DIAGNOSIS — C9192 Lymphoid leukemia, unspecified, in relapse: Secondary | ICD-10-CM

## 2015-01-26 DIAGNOSIS — L98499 Non-pressure chronic ulcer of skin of other sites with unspecified severity: Secondary | ICD-10-CM

## 2015-01-26 DIAGNOSIS — C9112 Chronic lymphocytic leukemia of B-cell type in relapse: Secondary | ICD-10-CM

## 2015-01-26 DIAGNOSIS — C50412 Malignant neoplasm of upper-outer quadrant of left female breast: Secondary | ICD-10-CM | POA: Diagnosis not present

## 2015-01-26 DIAGNOSIS — D61818 Other pancytopenia: Secondary | ICD-10-CM

## 2015-01-26 DIAGNOSIS — Z95828 Presence of other vascular implants and grafts: Secondary | ICD-10-CM

## 2015-01-26 LAB — CBC WITH DIFFERENTIAL/PLATELET
BASO%: 0 % (ref 0.0–2.0)
BASOS ABS: 0 10*3/uL (ref 0.0–0.1)
EOS%: 0.7 % (ref 0.0–7.0)
Eosinophils Absolute: 0.1 10*3/uL (ref 0.0–0.5)
HEMATOCRIT: 25.2 % — AB (ref 34.8–46.6)
HEMOGLOBIN: 8.5 g/dL — AB (ref 11.6–15.9)
LYMPH%: 90.7 % — ABNORMAL HIGH (ref 14.0–49.7)
MCH: 35 pg — AB (ref 25.1–34.0)
MCHC: 33.8 g/dL (ref 31.5–36.0)
MCV: 103.6 fL — AB (ref 79.5–101.0)
MONO#: 0.5 10*3/uL (ref 0.1–0.9)
MONO%: 6.2 % (ref 0.0–14.0)
NEUT#: 0.2 10*3/uL — CL (ref 1.5–6.5)
NEUT%: 2.4 % — ABNORMAL LOW (ref 38.4–76.8)
PLATELETS: 69 10*3/uL — AB (ref 145–400)
RBC: 2.43 10*6/uL — AB (ref 3.70–5.45)
RDW: 28.5 % — ABNORMAL HIGH (ref 11.2–14.5)
WBC: 7.3 10*3/uL (ref 3.9–10.3)
lymph#: 6.6 10*3/uL — ABNORMAL HIGH (ref 0.9–3.3)

## 2015-01-26 LAB — TECHNOLOGIST REVIEW

## 2015-01-26 MED ORDER — SODIUM CHLORIDE 0.9 % IJ SOLN
10.0000 mL | INTRAMUSCULAR | Status: DC | PRN
Start: 2015-01-26 — End: 2015-01-26
  Administered 2015-01-26: 10 mL via INTRAVENOUS
  Filled 2015-01-26: qty 10

## 2015-01-26 MED ORDER — HEPARIN SOD (PORK) LOCK FLUSH 100 UNIT/ML IV SOLN
500.0000 [IU] | Freq: Once | INTRAVENOUS | Status: AC
  Administered 2015-01-26: 500 [IU] via INTRAVENOUS
  Filled 2015-01-26: qty 5

## 2015-01-26 MED ORDER — TAMOXIFEN CITRATE 20 MG PO TABS: ORAL_TABLET | ORAL | Status: AC

## 2015-01-26 MED ORDER — PROCHLORPERAZINE MALEATE 10 MG PO TABS: 10.0000 mg | ORAL_TABLET | Freq: Four times a day (QID) | ORAL | Status: DC | PRN

## 2015-01-26 NOTE — Telephone Encounter (Signed)
Gave avs & calendar for May. Sent message to adjust treatment. °

## 2015-01-26 NOTE — Progress Notes (Signed)
[facility] OFFICE PROGRESS NOTE   Diagnosis:   CLL, pancytopenia  INTERVAL HISTORY:   [name] returns as scheduled. No fevers. Sweats continue to be improved. She denies nausea/vomiting. No rash. No diarrhea. No mouth sores. She describes her appetite as "so-so". She feels the sacral ulcer is healing. At today's visit she reports she is not taking Idelalisib as prescribed. She may take it every other day.  Objective:  Vital signs in last 24 hours:  Blood pressure 132/41, pulse 70, temperature 98.7 F (37.1 C), temperature source Oral, resp. rate 18, height 5' 3" (1.6 m), weight 210 lb 3.2 oz (95.346 kg), SpO2 100 %.    HEENT:  No thrush or ulcers. Lymphatics:  No cervical or supra-clavicular lymph nodes. Question soft mobile bilateral 2-3 cm axillary lymph nodes. Resp:  Lungs clear bilaterally. Cardio:  Regular rate and rhythm. GI:  Abdomen soft and nontender. No hepatomegaly. Vascular:  No leg edema.  Skin:  Superficial ulceration upper gluteal fold without surrounding erythema or induration.   Lab Results:  Lab Results  Component Value Date   WBC 5.3 01/19/2015   HGB 9.0* 01/19/2015   HCT 27.4* 01/19/2015   MCV 101.7* 01/19/2015   PLT 57* 01/19/2015   NEUTROABS 0.3* 01/19/2015    Imaging:  No results found.  Medications: I have reviewed the patient's current medications.  Assessment/Plan: 1. Relapsed CLL/well-differentiated lymphocytic lymphoma. Initial diagnosis dates to November 2009. Initially treated with modified fludarabine, Cytoxan and Rituxan with no response. No response to Campath. Partial response to Arzerra initially given between April and June 2010 and monthly through October 2010. In May 2013 there was deterioration of the blood counts. Arzerra was resumed and she again had a partial response. Blood counts began to fall again in August 2014. She began a trial of Ibrutinib 05/27/2013. It was stopped on 06/10/2013 due to profound fatigue,  nausea and anorexia. In addition, labs on 06/10/2013 showed significant deterioration in blood counts with the platelet count down to 3000, felt to be secondary to disease progression as this was how she initially presented. Treatment was initiated with Obinutuzumab and Leukeran on 06/23/2013. Blood counts improved. Obinutuzumab discontinued following cycle 3 due to development of chest tightness requiring hospitalization. She ruled out for an MI. Low dose daily chlorambucil continued until 01/09/2014 when it was placed on hold due to progressive neutropenia/thrombocytopenia.  Bone marrow biopsy 09/06/2014 confirmed involvement with CLL  Initiation of Rituxan/Idelalisib 12/01/2014  Cycle 2 Rituxan 12/22/2014  Cycle 3 Rituxan 01/12/2015 2. Extreme fatigue, anorexia while on Ibrutinib. 3. Multifocal invasive and noninvasive cancers left breast status post mastectomy 10/09/2010 (mpT2,mpN0) currently on tamoxifen. 4. Hypertension. 5. Recurrent atypical neurologic symptoms with dizziness, vertigo, paresthesias. 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 Mild changes in small vessels. No acute changes. Aspirin placed on hold due to severe thrombocytopenia. 6. Pancytopenia secondary to CLL involving the bone marrow. 7. Elevated creatinine (1.7) 11/21/2014. Renal ultrasound 11/24/2014 showed mild left hydronephrosis. No renal calculi identified. CT scans 12/08/2014 showed marked progression of lymphadenopathy in the abdomen and pelvis. Kidneys and ureters appeared normal. 8. Superficial skin breakdown at the upper gluteal fold.  She completed a course of doxycycline. Persistent superficial ulceration 01/26/2015.   Disposition: [name] has persistent pancytopenia. She has completed 3 cycles of Rituxan. She continues idelalisib.  We discussed the importance of taking the idelalisib as prescribed.  The area of superficial ulceration at the sacral region has not  healed.  We are making a referral to a home care agency for skin care evaluation.  She will return in one week for cycle 4 Rituxan. We will see her in follow-up in 2 weeks. She knows to contact the office in the interim with fever, chills, other signs of infection, bleeding or other problems.  Plan reviewed with Dr. Benay Spice.  Ned Card ANP/GNP-BC   01/26/2015  3:10 PM

## 2015-01-26 NOTE — Patient Instructions (Signed)

## 2015-01-28 ENCOUNTER — Other Ambulatory Visit: Payer: Self-pay | Admitting: Oncology

## 2015-02-02 ENCOUNTER — Other Ambulatory Visit (HOSPITAL_BASED_OUTPATIENT_CLINIC_OR_DEPARTMENT_OTHER): Payer: Medicare Other

## 2015-02-02 ENCOUNTER — Ambulatory Visit: Payer: Medicare Other

## 2015-02-02 ENCOUNTER — Ambulatory Visit (HOSPITAL_BASED_OUTPATIENT_CLINIC_OR_DEPARTMENT_OTHER): Payer: Medicare Other

## 2015-02-02 VITALS — BP 171/53 | HR 72 | Temp 97.9°F | Resp 18

## 2015-02-02 DIAGNOSIS — C9112 Chronic lymphocytic leukemia of B-cell type in relapse: Secondary | ICD-10-CM | POA: Diagnosis not present

## 2015-02-02 DIAGNOSIS — Z95828 Presence of other vascular implants and grafts: Secondary | ICD-10-CM

## 2015-02-02 DIAGNOSIS — C8590 Non-Hodgkin lymphoma, unspecified, unspecified site: Secondary | ICD-10-CM

## 2015-02-02 DIAGNOSIS — C9192 Lymphoid leukemia, unspecified, in relapse: Secondary | ICD-10-CM

## 2015-02-02 DIAGNOSIS — Z5112 Encounter for antineoplastic immunotherapy: Secondary | ICD-10-CM | POA: Diagnosis not present

## 2015-02-02 DIAGNOSIS — C9110 Chronic lymphocytic leukemia of B-cell type not having achieved remission: Secondary | ICD-10-CM

## 2015-02-02 LAB — MANUAL DIFFERENTIAL
ALC: 18.1 10*3/uL — ABNORMAL HIGH (ref 0.9–3.3)
ANC (CHCC manual diff): 0.4 10*3/uL — CL (ref 1.5–6.5)
BAND NEUTROPHILS: 0 % (ref 0–10)
Basophil: 0 % (ref 0–2)
Blasts: 0 % (ref 0–0)
EOS%: 0 % (ref 0–7)
LYMPH: 97 % — ABNORMAL HIGH (ref 14–49)
MONO: 1 % (ref 0–14)
Metamyelocytes: 0 % (ref 0–0)
Myelocytes: 0 % (ref 0–0)
Other Cell: 0 % (ref 0–0)
PLT EST: DECREASED
PROMYELO: 0 % (ref 0–0)
SEG: 2 % — AB (ref 38–77)
VARIANT LYMPH: 0 % (ref 0–0)
nRBC: 1 % — ABNORMAL HIGH (ref 0–0)

## 2015-02-02 LAB — CBC WITH DIFFERENTIAL/PLATELET
HCT: 25.4 % — ABNORMAL LOW (ref 34.8–46.6)
HGB: 8.5 g/dL — ABNORMAL LOW (ref 11.6–15.9)
MCH: 35.2 pg — ABNORMAL HIGH (ref 25.1–34.0)
MCHC: 33.5 g/dL (ref 31.5–36.0)
MCV: 105.1 fL — AB (ref 79.5–101.0)
Platelets: 74 10*3/uL — ABNORMAL LOW (ref 145–400)
RBC: 2.42 10*6/uL — ABNORMAL LOW (ref 3.70–5.45)
RDW: 29.1 % — ABNORMAL HIGH (ref 11.2–14.5)
WBC: 18.7 10*3/uL — AB (ref 3.9–10.3)

## 2015-02-02 LAB — HOLD TUBE, BLOOD BANK

## 2015-02-02 MED ORDER — SODIUM CHLORIDE 0.9 % IJ SOLN
10.0000 mL | INTRAMUSCULAR | Status: DC | PRN
  Administered 2015-02-02: 10 mL via INTRAVENOUS
  Filled 2015-02-02: qty 10

## 2015-02-02 MED ORDER — HEPARIN SOD (PORK) LOCK FLUSH 100 UNIT/ML IV SOLN
500.0000 [IU] | Freq: Once | INTRAVENOUS | Status: AC | PRN
  Administered 2015-02-02: 500 [IU]
  Filled 2015-02-02: qty 5

## 2015-02-02 MED ORDER — DIPHENHYDRAMINE HCL 25 MG PO CAPS
50.0000 mg | ORAL_CAPSULE | Freq: Once | ORAL | Status: AC
  Administered 2015-02-02: 50 mg via ORAL

## 2015-02-02 MED ORDER — SODIUM CHLORIDE 0.9 % IV SOLN
Freq: Once | INTRAVENOUS | Status: AC
  Administered 2015-02-02: 09:00:00 via INTRAVENOUS

## 2015-02-02 MED ORDER — ACETAMINOPHEN 325 MG PO TABS
650.0000 mg | ORAL_TABLET | Freq: Once | ORAL | Status: AC
  Administered 2015-02-02: 650 mg via ORAL

## 2015-02-02 MED ORDER — SODIUM CHLORIDE 0.9 % IJ SOLN
10.0000 mL | INTRAMUSCULAR | Status: DC | PRN
  Administered 2015-02-02: 10 mL
  Filled 2015-02-02: qty 10

## 2015-02-02 MED ORDER — ACETAMINOPHEN 325 MG PO TABS
ORAL_TABLET | ORAL | Status: AC
  Filled 2015-02-02: qty 2

## 2015-02-02 MED ORDER — SODIUM CHLORIDE 0.9 % IV SOLN
375.0000 mg/m2 | Freq: Once | INTRAVENOUS | Status: AC
  Administered 2015-02-02: 800 mg via INTRAVENOUS
  Filled 2015-02-02: qty 80

## 2015-02-02 MED ORDER — DIPHENHYDRAMINE HCL 25 MG PO CAPS
ORAL_CAPSULE | ORAL | Status: AC
  Filled 2015-02-02: qty 2

## 2015-02-02 NOTE — Patient Instructions (Signed)
[facility] Discharge Instructions for Patients Receiving Chemotherapy  Today you received the following chemotherapy agents Rituxan To help prevent nausea and vomiting after your treatment, we encourage you to take your nausea medication as prescribed.  If you develop nausea and vomiting that is not controlled by your nausea medication, call the clinic.   BELOW ARE SYMPTOMS THAT SHOULD BE REPORTED IMMEDIATELY:  *FEVER GREATER THAN 100.5 F  *CHILLS WITH OR WITHOUT FEVER  NAUSEA AND VOMITING THAT IS NOT CONTROLLED WITH YOUR NAUSEA MEDICATION  *UNUSUAL SHORTNESS OF BREATH  *UNUSUAL BRUISING OR BLEEDING  TENDERNESS IN MOUTH AND THROAT WITH OR WITHOUT PRESENCE OF ULCERS  *URINARY PROBLEMS  *BOWEL PROBLEMS  UNUSUAL RASH Items with * indicate a potential emergency and should be followed up as soon as possible.  Feel free to call the clinic you have any questions or concerns. The clinic phone number is [phone].  Please show the CHEMO ALERT CARD at check-in to the Emergency Department and triage nurse.   

## 2015-02-02 NOTE — Patient Instructions (Signed)

## 2015-02-09 ENCOUNTER — Ambulatory Visit (HOSPITAL_BASED_OUTPATIENT_CLINIC_OR_DEPARTMENT_OTHER): Payer: Medicare Other | Admitting: Nurse Practitioner

## 2015-02-09 ENCOUNTER — Other Ambulatory Visit: Payer: Self-pay | Admitting: *Deleted

## 2015-02-09 ENCOUNTER — Telehealth: Payer: Self-pay | Admitting: Nurse Practitioner

## 2015-02-09 ENCOUNTER — Ambulatory Visit (HOSPITAL_COMMUNITY)
Admission: RE | Admit: 2015-02-09 | Discharge: 2015-02-09 | Disposition: A | Discharge status: Home / Self Care | Payer: Medicare Other | Source: Ambulatory Visit | Attending: Oncology | Admitting: Oncology

## 2015-02-09 ENCOUNTER — Ambulatory Visit (HOSPITAL_BASED_OUTPATIENT_CLINIC_OR_DEPARTMENT_OTHER): Payer: Medicare Other

## 2015-02-09 ENCOUNTER — Other Ambulatory Visit: Payer: Self-pay | Admitting: Nurse Practitioner

## 2015-02-09 ENCOUNTER — Other Ambulatory Visit (HOSPITAL_BASED_OUTPATIENT_CLINIC_OR_DEPARTMENT_OTHER): Payer: Medicare Other

## 2015-02-09 ENCOUNTER — Ambulatory Visit: Payer: Medicare Other

## 2015-02-09 VITALS — BP 154/59 | HR 78 | Temp 98.2°F | Resp 20

## 2015-02-09 VITALS — BP 149/46 | HR 78 | Temp 98.2°F | Resp 18 | Ht 63.0 in | Wt 205.6 lb

## 2015-02-09 DIAGNOSIS — C9192 Lymphoid leukemia, unspecified, in relapse: Secondary | ICD-10-CM

## 2015-02-09 DIAGNOSIS — D649 Anemia, unspecified: Secondary | ICD-10-CM | POA: Diagnosis not present

## 2015-02-09 DIAGNOSIS — C9112 Chronic lymphocytic leukemia of B-cell type in relapse: Secondary | ICD-10-CM

## 2015-02-09 DIAGNOSIS — C50412 Malignant neoplasm of upper-outer quadrant of left female breast: Secondary | ICD-10-CM

## 2015-02-09 DIAGNOSIS — Z95828 Presence of other vascular implants and grafts: Secondary | ICD-10-CM

## 2015-02-09 DIAGNOSIS — L89899 Pressure ulcer of other site, unspecified stage: Secondary | ICD-10-CM

## 2015-02-09 DIAGNOSIS — R591 Generalized enlarged lymph nodes: Secondary | ICD-10-CM

## 2015-02-09 DIAGNOSIS — D61818 Other pancytopenia: Secondary | ICD-10-CM | POA: Diagnosis not present

## 2015-02-09 DIAGNOSIS — D709 Neutropenia, unspecified: Secondary | ICD-10-CM

## 2015-02-09 DIAGNOSIS — I10 Essential (primary) hypertension: Secondary | ICD-10-CM

## 2015-02-09 LAB — CBC WITH DIFFERENTIAL/PLATELET
BASO%: 0.9 % (ref 0.0–2.0)
Basophils Absolute: 0.1 10*3/uL (ref 0.0–0.1)
EOS ABS: 0.1 10*3/uL (ref 0.0–0.5)
EOS%: 0.7 % (ref 0.0–7.0)
HCT: 22.5 % — ABNORMAL LOW (ref 34.8–46.6)
HGB: 7.5 g/dL — ABNORMAL LOW (ref 11.6–15.9)
LYMPH%: 87.8 % — ABNORMAL HIGH (ref 14.0–49.7)
MCH: 36.3 pg — AB (ref 25.1–34.0)
MCHC: 33.5 g/dL (ref 31.5–36.0)
MCV: 108.3 fL — AB (ref 79.5–101.0)
MONO#: 0.7 10*3/uL (ref 0.1–0.9)
MONO%: 6.5 % (ref 0.0–14.0)
NEUT#: 0.4 10*3/uL — CL (ref 1.5–6.5)
NEUT%: 4.1 % — ABNORMAL LOW (ref 38.4–76.8)
Platelets: 64 10*3/uL — ABNORMAL LOW (ref 145–400)
RBC: 2.08 10*6/uL — ABNORMAL LOW (ref 3.70–5.45)
RDW: 29.4 % — ABNORMAL HIGH (ref 11.2–14.5)
WBC: 10.6 10*3/uL — ABNORMAL HIGH (ref 3.9–10.3)
lymph#: 9.3 10*3/uL — ABNORMAL HIGH (ref 0.9–3.3)

## 2015-02-09 LAB — COMPREHENSIVE METABOLIC PANEL (CC13)
ALT: 25 U/L (ref 0–55)
AST: 24 U/L (ref 5–34)
Albumin: 3.3 g/dL — ABNORMAL LOW (ref 3.5–5.0)
Alkaline Phosphatase: 67 U/L (ref 40–150)
Anion Gap: 9 mEq/L (ref 3–11)
BUN: 19.1 mg/dL (ref 7.0–26.0)
CALCIUM: 8.6 mg/dL (ref 8.4–10.4)
CHLORIDE: 105 meq/L (ref 98–109)
CO2: 24 meq/L (ref 22–29)
Creatinine: 1.4 mg/dL — ABNORMAL HIGH (ref 0.6–1.1)
EGFR: 42 mL/min/{1.73_m2} — ABNORMAL LOW (ref >90)
GLUCOSE: 143 mg/dL — AB (ref 70–140)
Potassium: 4.2 mEq/L (ref 3.5–5.1)
SODIUM: 138 meq/L (ref 136–145)
Total Bilirubin: 0.44 mg/dL (ref 0.20–1.20)
Total Protein: 5.7 g/dL — ABNORMAL LOW (ref 6.4–8.3)

## 2015-02-09 LAB — HOLD TUBE, BLOOD BANK

## 2015-02-09 LAB — TECHNOLOGIST REVIEW

## 2015-02-09 LAB — PREPARE RBC (CROSSMATCH)

## 2015-02-09 LAB — LACTATE DEHYDROGENASE (CC13): LDH: 359 U/L — ABNORMAL HIGH (ref 125–245)

## 2015-02-09 MED ORDER — SODIUM CHLORIDE 0.9 % IJ SOLN
10.0000 mL | INTRAMUSCULAR | Status: DC | PRN
  Administered 2015-02-09: 10 mL via INTRAVENOUS
  Filled 2015-02-09: qty 10

## 2015-02-09 MED ORDER — TRAMADOL HCL 50 MG PO TABS: 50.0000 mg | ORAL_TABLET | Freq: Three times a day (TID) | ORAL | Status: DC | PRN

## 2015-02-09 MED ORDER — SODIUM CHLORIDE 0.9 % IV SOLN
250.0000 mL | Freq: Once | INTRAVENOUS | Status: AC
  Administered 2015-02-09: 250 mL via INTRAVENOUS

## 2015-02-09 MED ORDER — HEPARIN SOD (PORK) LOCK FLUSH 100 UNIT/ML IV SOLN
500.0000 [IU] | Freq: Every day | INTRAVENOUS | Status: AC | PRN
  Administered 2015-02-09: 500 [IU]
  Filled 2015-02-09: qty 5

## 2015-02-09 MED ORDER — SODIUM CHLORIDE 0.9 % IJ SOLN
10.0000 mL | INTRAMUSCULAR | Status: AC | PRN
  Administered 2015-02-09: 10 mL
  Filled 2015-02-09: qty 10

## 2015-02-09 MED ORDER — HEPARIN SOD (PORK) LOCK FLUSH 100 UNIT/ML IV SOLN
500.0000 [IU] | Freq: Once | INTRAVENOUS | Status: AC
  Administered 2015-02-09: 500 [IU] via INTRAVENOUS
  Filled 2015-02-09: qty 5

## 2015-02-09 NOTE — Patient Instructions (Signed)

## 2015-02-09 NOTE — Telephone Encounter (Signed)
Pt confirmed labs/ov per 05/13 POF, gave pt AVS and Calendar.... KJ, s/w NP/LT advising that I put pt in a 60 min time slot so that there would not be a 15 min time available due to all PA/NP times are 30 mins. Per Lattie Haw she is going to start putting pt's at 23 instead of the 45 so help the schedulers also.Marland KitchenMarland KitchenMarland Kitchen

## 2015-02-09 NOTE — Progress Notes (Addendum)
[facility] OFFICE PROGRESS NOTE   Diagnosis:  CLL, pancytopenia  INTERVAL HISTORY:   Ms. Klinkner returns as scheduled. She completed cycle 4 Rituxan 02/02/2015. She continues Idelalisib. She denies nausea/vomiting. No mouth sores. No diarrhea. No rash. She has noted a mild increase in sweats. She had an episode of dizziness yesterday. No associated fevers. Lymph nodes continue to be improved. She reports the wound at the upper gluteal fold has progressed and she is having more discomfort. She notes an odor and drainage. A home health wound nurse is following her.  Objective:  Vital signs in last 24 hours:  Blood pressure 149/46, pulse 78, temperature 98.2 F (36.8 C), temperature source Oral, resp. rate 18, height 5' 3"  (1.6 m), weight 205 lb 9.6 oz (93.26 kg), SpO2 100 %.    HEENT: No thrush or ulcers. Lymphatics: 1 cm right axillary/chest wall lymph node. 2-3 cm left axillary lymph node. Resp: Lungs clear bilaterally. Cardio: Regular rate and rhythm. GI: Abdomen is soft and nontender. No hepatomegaly. Vascular: No leg edema.  Skin: Approximate 5 cm ulceration beginning at the upper gluteal fold. Foul odor noted.    Lab Results:  Lab Results  Component Value Date   WBC 10.6* 02/09/2015   HGB 7.5* 02/09/2015   HCT 22.5* 02/09/2015   MCV 108.3* 02/09/2015   PLT 64* 02/09/2015   NEUTROABS 0.4* 02/09/2015    Imaging:  No results found.  Medications: I have reviewed the patient's current medications.  Assessment/Plan: 1. Relapsed CLL/well-differentiated lymphocytic lymphoma. Initial diagnosis dates to November 2009. Initially treated with modified fludarabine, Cytoxan and Rituxan with no response. No response to Campath. Partial response to Arzerra initially given between April and June 2010 and monthly through October 2010. In May 2013 there was deterioration of the blood counts. Arzerra was resumed and she again had a partial response. Blood counts began to  fall again in August 2014. She began a trial of Ibrutinib 05/27/2013. It was stopped on 06/10/2013 due to profound fatigue, nausea and anorexia. In addition, labs on 06/10/2013 showed significant deterioration in blood counts with the platelet count down to 3000, felt to be secondary to disease progression as this was how she initially presented. Treatment was initiated with Obinutuzumab and Leukeran on 06/23/2013. Blood counts improved. Obinutuzumab discontinued following cycle 3 due to development of chest tightness requiring hospitalization. She ruled out for an MI. Low dose daily chlorambucil continued until 01/09/2014 when it was placed on hold due to progressive neutropenia/thrombocytopenia.  Bone marrow biopsy 09/06/2014 confirmed involvement with CLL  Initiation of Rituxan/Idelalisib 12/01/2014  Cycle 2 Rituxan 12/22/2014  Cycle 3 Rituxan 01/12/2015  Cycle 4 Rituxan 02/02/2015 2. Extreme fatigue, anorexia while on Ibrutinib. 3. Multifocal invasive and noninvasive cancers left breast status post mastectomy 10/09/2010 (mpT2,mpN0) currently on tamoxifen. 4. Hypertension. 5. Recurrent atypical neurologic symptoms with dizziness, vertigo, paresthesias. 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 Mild changes in small vessels. No acute changes. Aspirin placed on hold due to severe thrombocytopenia. 6. Pancytopenia secondary to CLL involving the bone marrow. 7. Elevated creatinine (1.7) 11/21/2014. Renal ultrasound 11/24/2014 showed mild left hydronephrosis. No renal calculi identified. CT scans 12/08/2014 showed marked progression of lymphadenopathy in the abdomen and pelvis. Kidneys and ureters appeared normal. 8. Superficial skin breakdown at the upper gluteal fold. She completed a course of doxycycline. Persistent superficial ulceration 01/26/2015. She is followed by a wound nurse.   Disposition: Ms. Dolder has completed 4 cycles of  Rituxan and has been on  Idelalisib since early March. Peripheral adenopathy and sweats are better. She continues to have pancytopenia. The wound at the upper gluteal fold has progressed.  Dr. Benay Spice recommends placing Idelalisib on hold due to persistent pancytopenia as well as concern that it may be contributing to the lack of wound healing. We are making a referral to the wound clinic.   She has progressive anemia. We are arranging for a blood transfusion.  She will return for a follow-up CBC in one week. We will see her for an office visit in 2 weeks. She will contact the office in the interim with any problems. We specifically discussed fever.  Patient seen with Dr. Benay Spice.    Ned Card ANP/GNP-BC   02/09/2015  11:58 AM  This was a shared visit with Ned Card. Ms. Teska was interviewed and examined. We decided to place systemic therapy on hold secondary to the persistent neutropenia and progressive decubitus ulcer. She will return for an office visit in 2 weeks. We will consider switching systemic therapies if there is persistent pancytopenia while off of Idelalisib.  [name], M.D.

## 2015-02-09 NOTE — Patient Instructions (Signed)

## 2015-02-10 ENCOUNTER — Ambulatory Visit (HOSPITAL_BASED_OUTPATIENT_CLINIC_OR_DEPARTMENT_OTHER): Payer: Medicare Other

## 2015-02-10 VITALS — BP 140/45 | HR 79 | Temp 97.6°F | Resp 18

## 2015-02-10 DIAGNOSIS — C9112 Chronic lymphocytic leukemia of B-cell type in relapse: Secondary | ICD-10-CM

## 2015-02-10 DIAGNOSIS — D61818 Other pancytopenia: Secondary | ICD-10-CM

## 2015-02-10 DIAGNOSIS — C9192 Lymphoid leukemia, unspecified, in relapse: Secondary | ICD-10-CM

## 2015-02-10 MED ORDER — ACETAMINOPHEN 325 MG PO TABS
650.0000 mg | ORAL_TABLET | Freq: Once | ORAL | Status: AC
  Administered 2015-02-10: 650 mg via ORAL

## 2015-02-10 MED ORDER — ACETAMINOPHEN 325 MG PO TABS
ORAL_TABLET | ORAL | Status: AC
  Filled 2015-02-10: qty 2

## 2015-02-10 MED ORDER — HEPARIN SOD (PORK) LOCK FLUSH 100 UNIT/ML IV SOLN
500.0000 [IU] | Freq: Every day | INTRAVENOUS | Status: AC | PRN
  Administered 2015-02-10: 500 [IU]
  Filled 2015-02-10: qty 5

## 2015-02-10 MED ORDER — SODIUM CHLORIDE 0.9 % IJ SOLN
10.0000 mL | INTRAMUSCULAR | Status: AC | PRN
  Administered 2015-02-10: 10 mL
  Filled 2015-02-10: qty 10

## 2015-02-10 MED ORDER — SODIUM CHLORIDE 0.9 % IV SOLN
250.0000 mL | Freq: Once | INTRAVENOUS | Status: AC
  Administered 2015-02-10: 250 mL via INTRAVENOUS

## 2015-02-10 NOTE — Progress Notes (Signed)
[id number] .  Pt here for blood transfusion.  No pre meds ordered.  Pt requested Tylenol 650mg  for pain on buttocks -  8/10.  Pt was prescribed Tramadol yesterday but has not picked up med yet.  Dr. Whitney Muse notified - on call md.  Order received for Tylenol  To be given. 1100 -  Pt stated relief of pain in buttocks -  2/10.

## 2015-02-10 NOTE — Patient Instructions (Signed)

## 2015-02-11 LAB — TYPE AND SCREEN
ABO/RH(D): A POS
ANTIBODY SCREEN: NEGATIVE
UNIT DIVISION: 0
Unit division: 0

## 2015-02-12 ENCOUNTER — Telehealth: Payer: Self-pay | Admitting: *Deleted

## 2015-02-12 NOTE — Telephone Encounter (Signed)
NOTIFIED PT. THAT A REFERRAL TO THE WOUND CENTER WAS PLACED BY LISA THOMAS,NP ON 02/09/15. LEFT A MESSAGE WITH DR.SHERRILL'S SCHEDULER, KIM JARVIS, CONCERNING REFERRAL. INFORMED PT. IT WILL BE A FEW DAYS BEFORE AN APPOINTMENT WILL BE MADE. INSTRUCTED PT. TO CALL THIS OFFICE ON Thursday IF SHE HAS NOT RECEIVED AN APPOINTMENT WITH THE WOUND CENTER. SHE VOICES UNDERSTANDING.

## 2015-02-14 ENCOUNTER — Inpatient Hospital Stay (HOSPITAL_COMMUNITY)
Admission: EM | Admit: 2015-02-14 | Discharge: 2015-02-17 | DRG: 593 | Disposition: A | Discharge status: Skilled Nursing Facility | Payer: Medicare Other | Attending: Internal Medicine | Admitting: Internal Medicine

## 2015-02-14 ENCOUNTER — Encounter (HOSPITAL_COMMUNITY): Payer: Self-pay

## 2015-02-14 ENCOUNTER — Telehealth: Payer: Self-pay | Admitting: *Deleted

## 2015-02-14 DIAGNOSIS — Z9221 Personal history of antineoplastic chemotherapy: Secondary | ICD-10-CM | POA: Diagnosis not present

## 2015-02-14 DIAGNOSIS — C9110 Chronic lymphocytic leukemia of B-cell type not having achieved remission: Secondary | ICD-10-CM | POA: Diagnosis not present

## 2015-02-14 DIAGNOSIS — C9112 Chronic lymphocytic leukemia of B-cell type in relapse: Secondary | ICD-10-CM | POA: Diagnosis present

## 2015-02-14 DIAGNOSIS — Z8249 Family history of ischemic heart disease and other diseases of the circulatory system: Secondary | ICD-10-CM | POA: Diagnosis not present

## 2015-02-14 DIAGNOSIS — C50912 Malignant neoplasm of unspecified site of left female breast: Secondary | ICD-10-CM | POA: Diagnosis present

## 2015-02-14 DIAGNOSIS — F0390 Unspecified dementia without behavioral disturbance: Secondary | ICD-10-CM | POA: Diagnosis present

## 2015-02-14 DIAGNOSIS — Z888 Allergy status to other drugs, medicaments and biological substances status: Secondary | ICD-10-CM | POA: Diagnosis not present

## 2015-02-14 DIAGNOSIS — Z9012 Acquired absence of left breast and nipple: Secondary | ICD-10-CM | POA: Diagnosis present

## 2015-02-14 DIAGNOSIS — I10 Essential (primary) hypertension: Secondary | ICD-10-CM | POA: Diagnosis not present

## 2015-02-14 DIAGNOSIS — L98411 Non-pressure chronic ulcer of buttock limited to breakdown of skin: Secondary | ICD-10-CM | POA: Diagnosis not present

## 2015-02-14 DIAGNOSIS — R413 Other amnesia: Secondary | ICD-10-CM

## 2015-02-14 DIAGNOSIS — Z79810 Long term (current) use of selective estrogen receptor modulators (SERMs): Secondary | ICD-10-CM | POA: Diagnosis not present

## 2015-02-14 DIAGNOSIS — Z91040 Latex allergy status: Secondary | ICD-10-CM

## 2015-02-14 DIAGNOSIS — C8590 Non-Hodgkin lymphoma, unspecified, unspecified site: Secondary | ICD-10-CM | POA: Diagnosis present

## 2015-02-14 DIAGNOSIS — M1990 Unspecified osteoarthritis, unspecified site: Secondary | ICD-10-CM | POA: Diagnosis present

## 2015-02-14 DIAGNOSIS — L98491 Non-pressure chronic ulcer of skin of other sites limited to breakdown of skin: Secondary | ICD-10-CM | POA: Diagnosis not present

## 2015-02-14 DIAGNOSIS — D61818 Other pancytopenia: Secondary | ICD-10-CM | POA: Diagnosis present

## 2015-02-14 DIAGNOSIS — L98419 Non-pressure chronic ulcer of buttock with unspecified severity: Principal | ICD-10-CM | POA: Diagnosis present

## 2015-02-14 DIAGNOSIS — L03317 Cellulitis of buttock: Secondary | ICD-10-CM | POA: Diagnosis present

## 2015-02-14 DIAGNOSIS — D709 Neutropenia, unspecified: Secondary | ICD-10-CM | POA: Diagnosis not present

## 2015-02-14 DIAGNOSIS — Z803 Family history of malignant neoplasm of breast: Secondary | ICD-10-CM

## 2015-02-14 DIAGNOSIS — Z853 Personal history of malignant neoplasm of breast: Secondary | ICD-10-CM

## 2015-02-14 DIAGNOSIS — R42 Dizziness and giddiness: Secondary | ICD-10-CM

## 2015-02-14 DIAGNOSIS — L03818 Cellulitis of other sites: Secondary | ICD-10-CM

## 2015-02-14 DIAGNOSIS — E785 Hyperlipidemia, unspecified: Secondary | ICD-10-CM | POA: Diagnosis present

## 2015-02-14 DIAGNOSIS — Z880 Allergy status to penicillin: Secondary | ICD-10-CM

## 2015-02-14 DIAGNOSIS — Z8673 Personal history of transient ischemic attack (TIA), and cerebral infarction without residual deficits: Secondary | ICD-10-CM

## 2015-02-14 DIAGNOSIS — Z823 Family history of stroke: Secondary | ICD-10-CM

## 2015-02-14 DIAGNOSIS — L0390 Cellulitis, unspecified: Secondary | ICD-10-CM | POA: Diagnosis present

## 2015-02-14 DIAGNOSIS — D899 Disorder involving the immune mechanism, unspecified: Secondary | ICD-10-CM | POA: Diagnosis present

## 2015-02-14 DIAGNOSIS — C50919 Malignant neoplasm of unspecified site of unspecified female breast: Secondary | ICD-10-CM | POA: Diagnosis present

## 2015-02-14 DIAGNOSIS — Z79891 Long term (current) use of opiate analgesic: Secondary | ICD-10-CM

## 2015-02-14 DIAGNOSIS — Z79899 Other long term (current) drug therapy: Secondary | ICD-10-CM | POA: Diagnosis not present

## 2015-02-14 DIAGNOSIS — R52 Pain, unspecified: Secondary | ICD-10-CM | POA: Diagnosis not present

## 2015-02-14 LAB — COMPREHENSIVE METABOLIC PANEL
ALT: 28 U/L (ref 14–54)
AST: 28 U/L (ref 15–41)
Albumin: 3.6 g/dL (ref 3.5–5.0)
Alkaline Phosphatase: 66 U/L (ref 38–126)
Anion gap: 9 (ref 5–15)
BILIRUBIN TOTAL: 1.1 mg/dL (ref 0.3–1.2)
BUN: 16 mg/dL (ref 6–20)
CO2: 26 mmol/L (ref 22–32)
Calcium: 9 mg/dL (ref 8.9–10.3)
Chloride: 101 mmol/L (ref 101–111)
Creatinine, Ser: 1.11 mg/dL — ABNORMAL HIGH (ref 0.44–1.00)
GFR calc Af Amer: 54 mL/min — ABNORMAL LOW (ref >60)
GFR calc non Af Amer: 46 mL/min — ABNORMAL LOW (ref >60)
GLUCOSE: 107 mg/dL — AB (ref 65–99)
POTASSIUM: 4.8 mmol/L (ref 3.5–5.1)
SODIUM: 136 mmol/L (ref 135–145)
TOTAL PROTEIN: 6.1 g/dL — AB (ref 6.5–8.1)

## 2015-02-14 LAB — CBC WITH DIFFERENTIAL/PLATELET
BASOS ABS: 0.1 10*3/uL (ref 0.0–0.1)
Basophils Relative: 1 % (ref 0–1)
Eosinophils Absolute: 0.1 10*3/uL (ref 0.0–0.7)
Eosinophils Relative: 1 % (ref 0–5)
HCT: 29 % — ABNORMAL LOW (ref 36.0–46.0)
HEMOGLOBIN: 9.9 g/dL — AB (ref 12.0–15.0)
LYMPHS ABS: 4.3 10*3/uL — AB (ref 0.7–4.0)
Lymphocytes Relative: 81 % — ABNORMAL HIGH (ref 12–46)
MCH: 34.7 pg — ABNORMAL HIGH (ref 26.0–34.0)
MCHC: 34.1 g/dL (ref 30.0–36.0)
MCV: 101.8 fL — ABNORMAL HIGH (ref 78.0–100.0)
MONO ABS: 0.8 10*3/uL (ref 0.1–1.0)
Monocytes Relative: 15 % — ABNORMAL HIGH (ref 3–12)
NEUTROS ABS: 0.1 10*3/uL — AB (ref 1.7–7.7)
Neutrophils Relative %: 2 % — ABNORMAL LOW (ref 43–77)
PLATELETS: 70 10*3/uL — AB (ref 150–400)
RBC: 2.85 MIL/uL — ABNORMAL LOW (ref 3.87–5.11)
RDW: 23.4 % — AB (ref 11.5–15.5)
WBC: 5.4 10*3/uL (ref 4.0–10.5)

## 2015-02-14 LAB — I-STAT CG4 LACTIC ACID, ED: LACTIC ACID, VENOUS: 1.27 mmol/L (ref 0.5–2.0)

## 2015-02-14 MED ORDER — FENTANYL CITRATE (PF) 100 MCG/2ML IJ SOLN
50.0000 ug | Freq: Once | INTRAMUSCULAR | Status: DC
  Filled 2015-02-14: qty 2

## 2015-02-14 MED ORDER — ONDANSETRON HCL 4 MG/2ML IJ SOLN: 4.0000 mg | Freq: Four times a day (QID) | INTRAMUSCULAR | Status: DC | PRN

## 2015-02-14 MED ORDER — ALBUTEROL SULFATE (2.5 MG/3ML) 0.083% IN NEBU: 2.5000 mg | INHALATION_SOLUTION | RESPIRATORY_TRACT | Status: DC | PRN

## 2015-02-14 MED ORDER — ONDANSETRON HCL 4 MG PO TABS: 4.0000 mg | ORAL_TABLET | Freq: Four times a day (QID) | ORAL | Status: DC | PRN

## 2015-02-14 MED ORDER — SODIUM CHLORIDE 0.9 % IV SOLN
Freq: Once | INTRAVENOUS | Status: AC
Start: 2015-02-14 — End: 2015-02-14
  Administered 2015-02-14: 18:00:00 via INTRAVENOUS

## 2015-02-14 MED ORDER — TAMOXIFEN CITRATE 10 MG PO TABS
20.0000 mg | ORAL_TABLET | Freq: Every day | ORAL | Status: DC
  Administered 2015-02-15 – 2015-02-17 (×3): 20 mg via ORAL
  Filled 2015-02-14 (×4): qty 2

## 2015-02-14 MED ORDER — FLEET ENEMA 7-19 GM/118ML RE ENEM: 1.0000 | ENEMA | Freq: Once | RECTAL | Status: AC | PRN

## 2015-02-14 MED ORDER — GUAIFENESIN-DM 100-10 MG/5ML PO SYRP: 5.0000 mL | ORAL_SOLUTION | ORAL | Status: DC | PRN

## 2015-02-14 MED ORDER — HYDROCHLOROTHIAZIDE 12.5 MG PO CAPS
12.5000 mg | ORAL_CAPSULE | Freq: Every day | ORAL | Status: DC
  Administered 2015-02-15 – 2015-02-17 (×3): 12.5 mg via ORAL
  Filled 2015-02-14 (×3): qty 1

## 2015-02-14 MED ORDER — PANTOPRAZOLE SODIUM 40 MG PO TBEC
40.0000 mg | DELAYED_RELEASE_TABLET | Freq: Every day | ORAL | Status: DC
  Administered 2015-02-15 – 2015-02-17 (×3): 40 mg via ORAL
  Filled 2015-02-14 (×3): qty 1

## 2015-02-14 MED ORDER — VANCOMYCIN HCL 10 G IV SOLR
1500.0000 mg | INTRAVENOUS | Status: DC
  Administered 2015-02-14 – 2015-02-16 (×3): 1500 mg via INTRAVENOUS
  Filled 2015-02-14 (×4): qty 1500

## 2015-02-14 MED ORDER — SODIUM CHLORIDE 0.9 % IV SOLN
INTRAVENOUS | Status: DC
  Administered 2015-02-14 – 2015-02-15 (×2): via INTRAVENOUS

## 2015-02-14 MED ORDER — POLYETHYLENE GLYCOL 3350 17 G PO PACK
17.0000 g | PACK | Freq: Every day | ORAL | Status: DC
  Administered 2015-02-15 – 2015-02-17 (×3): 17 g via ORAL
  Filled 2015-02-14 (×4): qty 1

## 2015-02-14 MED ORDER — MECLIZINE HCL 12.5 MG PO TABS
12.5000 mg | ORAL_TABLET | Freq: Three times a day (TID) | ORAL | Status: DC | PRN
  Filled 2015-02-14: qty 1

## 2015-02-14 MED ORDER — MORPHINE SULFATE 2 MG/ML IJ SOLN: 1.0000 mg | INTRAMUSCULAR | Status: DC | PRN

## 2015-02-14 MED ORDER — FENTANYL CITRATE (PF) 100 MCG/2ML IJ SOLN
50.0000 ug | Freq: Once | INTRAMUSCULAR | Status: AC
  Administered 2015-02-14: 50 ug via NASAL

## 2015-02-14 MED ORDER — ACETAMINOPHEN 325 MG PO TABS: 650.0000 mg | ORAL_TABLET | Freq: Four times a day (QID) | ORAL | Status: DC | PRN

## 2015-02-14 MED ORDER — AMLODIPINE BESYLATE 10 MG PO TABS
10.0000 mg | ORAL_TABLET | Freq: Every day | ORAL | Status: DC
  Administered 2015-02-15 – 2015-02-17 (×3): 10 mg via ORAL
  Filled 2015-02-14 (×3): qty 1

## 2015-02-14 MED ORDER — ATENOLOL 50 MG PO TABS
50.0000 mg | ORAL_TABLET | Freq: Every day | ORAL | Status: DC
  Administered 2015-02-15 – 2015-02-17 (×3): 50 mg via ORAL
  Filled 2015-02-14 (×3): qty 1

## 2015-02-14 MED ORDER — ACETAMINOPHEN 650 MG RE SUPP: 650.0000 mg | Freq: Four times a day (QID) | RECTAL | Status: DC | PRN

## 2015-02-14 MED ORDER — ACYCLOVIR 400 MG PO TABS
400.0000 mg | ORAL_TABLET | Freq: Every day | ORAL | Status: DC
  Administered 2015-02-15 – 2015-02-17 (×3): 400 mg via ORAL
  Filled 2015-02-14 (×3): qty 1

## 2015-02-14 MED ORDER — DONEPEZIL HCL 5 MG PO TABS
5.0000 mg | ORAL_TABLET | Freq: Every day | ORAL | Status: DC
  Administered 2015-02-14 – 2015-02-16 (×3): 5 mg via ORAL
  Filled 2015-02-14 (×4): qty 1

## 2015-02-14 MED ORDER — TRAMADOL HCL 50 MG PO TABS
50.0000 mg | ORAL_TABLET | Freq: Three times a day (TID) | ORAL | Status: DC | PRN
  Administered 2015-02-14: 50 mg via ORAL
  Filled 2015-02-14: qty 1

## 2015-02-14 MED ORDER — SODIUM CHLORIDE 0.9 % IJ SOLN: 10.0000 mL | INTRAMUSCULAR | Status: DC | PRN

## 2015-02-14 MED ORDER — BISACODYL 10 MG RE SUPP: 10.0000 mg | Freq: Every day | RECTAL | Status: DC | PRN

## 2015-02-14 MED ORDER — OXYCODONE HCL 5 MG PO TABS
5.0000 mg | ORAL_TABLET | ORAL | Status: DC | PRN
  Administered 2015-02-15 (×2): 5 mg via ORAL
  Filled 2015-02-14 (×3): qty 1

## 2015-02-14 MED ORDER — CALCIUM CARBONATE ANTACID 500 MG PO CHEW: 1.0000 | CHEWABLE_TABLET | ORAL | Status: DC | PRN

## 2015-02-14 MED ORDER — CLOTRIMAZOLE 1 % EX CREA
TOPICAL_CREAM | Freq: Two times a day (BID) | CUTANEOUS | Status: DC
  Administered 2015-02-14: 23:00:00 via TOPICAL
  Administered 2015-02-14: 1 via TOPICAL
  Administered 2015-02-15 – 2015-02-16 (×4): via TOPICAL
  Filled 2015-02-14 (×2): qty 15

## 2015-02-14 NOTE — ED Notes (Signed)
Pt up to use the restroom. IV team outside of room preparing to try port access again.

## 2015-02-14 NOTE — ED Notes (Addendum)
Pt presents with c/o wound on her buttocks. Pt has a home health nurse that has been treating the wound at home and the nurse reported that the wound is not getting any better and the patient needed to come to the ER to have it treated. Pt family member reports that the home health nurse believes it to be an ulcer of some sort. Pt has an appointment at the wound clinic on June 14th but family reports she cannot wait until then. Pt is also a cancer patient. Pt also c/o abdominal pain, reports this is not new for her.

## 2015-02-14 NOTE — Progress Notes (Signed)
ANTIBIOTIC CONSULT NOTE - INITIAL  Pharmacy Consult for vancomycin Indication: cellulitis  Allergies  Allergen Reactions  . Obinutuzumab Shortness Of Breath and Rash  . Penicillins Anaphylaxis, Shortness Of Breath, Itching, Swelling and Rash    Swelling all over body and throat   . Latex Itching    Patient Measurements: weight 93 kg, height 63 inches   Vital Signs: Temp: 99 F (37.2 C) (05/18 1208) Temp Source: Oral (05/18 1208) BP: 148/62 mmHg (05/18 1426) Pulse Rate: 81 (05/18 1426) Intake/Output from previous day:   Intake/Output from this shift:    Labs:  Recent Labs  02/14/15 1428  WBC 5.4  HGB 9.9*  PLT PENDING   Estimated Creatinine Clearance: 36 mL/min (by C-G formula based on Cr of 1.4). No results for input(s): VANCOTROUGH, VANCOPEAK, VANCORANDOM, GENTTROUGH, GENTPEAK, GENTRANDOM, TOBRATROUGH, TOBRAPEAK, TOBRARND, AMIKACINPEAK, AMIKACINTROU, AMIKACIN in the last 72 hours.   Microbiology: Recent Results (from the past 720 hour(s))  TECHNOLOGIST REVIEW     Status: None   Collection Time: 01/19/15 10:30 AM  Result Value Ref Range Status   Technologist Review Variant lymphs present and few smudge cells  Final  TECHNOLOGIST REVIEW     Status: None   Collection Time: 01/26/15  2:20 PM  Result Value Ref Range Status   Technologist Review Variant lymphs present. few smudge cells  Final  TECHNOLOGIST REVIEW     Status: None   Collection Time: 02/09/15 10:24 AM  Result Value Ref Range Status   Technologist Review Variant lymphs present  Final    Medical History: Past Medical History  Diagnosis Date  . 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 Stroke 05/2009    05/2009  Right upper extrem/leg weakness; nuchal pain;  . Benign essential HTN   . Macrocytic anemia   . Vertigo     ?TIA - MRI brain 5/13  No new CVA  . Pleurisy     01/16/12 pleuritic  right chest pain  V/Q scan [facility] low probability PE  . Breast cancer left T2N0 S/P mastectomy and SLN Bx 09/2010 2009  . CLL (chronic lymphoid leukemia) in relapse   . Arthritis   . Hyperlipidemia   . Thrombocytopenia, unspecified 06/15/2013    Medications:   see med rec  Assessment: Patient's a 81 y.o F with hx of buttock wound and has been receiving wound care PTA.  She presents to the ED today for further management of wound as site does not appear to be improving.  To start vancomycin for suspected cellulitis.   Goal of Therapy:  Vancomycin trough level 10-15 mcg/ml  Plan:  - vancomycin 1500 mg IV q24h - f/u renal function   Tige Meas P 02/14/2015,3:14 PM

## 2015-02-14 NOTE — ED Provider Notes (Signed)
CSN: [id number]     Arrival date & time 02/14/15  1157 History   First MD Initiated Contact with Patient 02/14/15 1243     Chief Complaint  Patient presents with  . Open Wound     (Consider location/radiation/quality/duration/timing/severity/associated sxs/prior Treatment) HPI  Patient presents with concern of painful wound on the buttocks, perineum. Patient has CLL, last chemotherapy 10 days ago. Over the past week or so the patient has developed increasing pain, soreness about the gluteal cleft Pain is sharp, severe, not improved with anything. Patient has had multiple home health visits, but today home health nurse felt the wound appeared worse. Patient denies new fever, abdominal pain, nausea, vomiting. She does endorse weakness, anorexia. Patient spoke with her oncologist, was referred here for evaluation.   Past Medical History  Diagnosis Date  . 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 Stroke 05/2009    05/2009  Right upper extrem/leg weakness; nuchal pain;  . Benign essential HTN   . Macrocytic anemia   . Vertigo     ?TIA - MRI brain 5/13  No new CVA  . Pleurisy     01/16/12 pleuritic right chest pain  V/Q scan [facility] low probability PE  . Breast cancer left T2N0 S/P mastectomy and SLN Bx 09/2010 2009  . CLL (chronic lymphoid leukemia) in relapse   . Arthritis   . Hyperlipidemia   . Thrombocytopenia, unspecified 06/15/2013   Past Surgical History  Procedure Laterality Date  . Tonsillectomy    . Abdominal hysterectomy    . Right parotid gland    . Hemorrhoid surgery    . Breast biopsy Left 2009  . Mastectomy  02/ 2012    left breast  . Porta-cath      right chest   Family History  Problem Relation Age of Onset  . Breast cancer Mother   . Hyperlipidemia    . Stroke    . Hypertension    . Arthritis     History  Substance Use Topics   . Smoking status: Never Smoker   . Smokeless tobacco: Never Used  . Alcohol Use: No   OB History    No data available     Review of Systems  Constitutional:       Per HPI, otherwise negative  HENT:       Per HPI, otherwise negative  Respiratory:       Per HPI, otherwise negative  Cardiovascular:       Per HPI, otherwise negative  Gastrointestinal: Negative for vomiting.  Endocrine:       Negative aside from HPI  Genitourinary:       Neg aside from HPI   Musculoskeletal:       Per HPI, otherwise negative  Skin: Positive for color change and wound.  Allergic/Immunologic: Positive for immunocompromised state.  Neurological: Negative for syncope.      Allergies  Obinutuzumab; Penicillins; and Latex  Home Medications   Prior to Admission medications   Medication Sig Start Date End Date Taking? Authorizing Provider  acetaminophen (TYLENOL) 500 MG tablet Take 125 mg by mouth every 6 (six) hours as needed. For pain   Yes Historical Provider, MD  acyclovir (ZOVIRAX) 400 MG tablet Take 1 tablet (400 mg total) by mouth daily. 11/03/14  Yes Rowe Clack, MD  amLODipine (NORVASC) 10 MG tablet Take 1 tablet (10 mg  total) by mouth daily. 07/07/13  Yes Domenic Polite, MD  atenolol (TENORMIN) 50 MG tablet Take 1 tablet (50 mg total) by mouth daily. 09/13/14  Yes Rowe Clack, MD  calcium carbonate (TUMS - DOSED IN MG ELEMENTAL CALCIUM) 500 MG chewable tablet Chew 1 tablet by mouth as needed for indigestion or heartburn. For stomach   Yes Historical Provider, MD  donepezil (ARICEPT) 5 MG tablet Take 1 tablet (5 mg total) by mouth at bedtime. 09/04/14  Yes Rowe Clack, MD  hydrochlorothiazide (MICROZIDE) 12.5 MG capsule Take 1 capsule (12.5 mg total) by mouth daily. 09/04/14  Yes Rowe Clack, MD  lidocaine-prilocaine (EMLA) cream Apply 1 application topically as needed. Patient taking differently: Apply 1 application topically as needed (for port).  01/04/15  Yes Owens Shark, NP  magnesium hydroxide (MILK OF MAGNESIA) 400 MG/5ML suspension Take 15 mLs by mouth daily as needed for mild constipation.   Yes Historical Provider, MD  meclizine (ANTIVERT) 12.5 MG tablet Take 1 tablet (12.5 mg total) by mouth 3 (three) times daily as needed for dizziness. 09/04/14  Yes Rowe Clack, MD  pantoprazole (PROTONIX) 40 MG tablet TAKE 1 TABLET (40 MG TOTAL) BY MOUTH DAILY. 10/09/14  Yes Rowe Clack, MD  potassium chloride SA (KLOR-CON M20) 20 MEQ tablet Take 1 tablet (20 mEq total) by mouth daily. Patient taking differently: Take 20 mEq by mouth daily as needed (for cramps).  09/04/14  Yes Rowe Clack, MD  prochlorperazine (COMPAZINE) 10 MG tablet Take 1 tablet (10 mg total) by mouth every 6 (six) hours as needed. For nausea Patient taking differently: Take 10 mg by mouth every 6 (six) hours as needed for nausea.  01/26/15  Yes Owens Shark, NP  tamoxifen (NOLVADEX) 20 MG tablet TAKE 1 TABLET (20 MG TOTAL) BY MOUTH DAILY. 01/26/15  Yes Owens Shark, NP  traMADol (ULTRAM) 50 MG tablet Take 1 tablet (50 mg total) by mouth every 8 (eight) hours as needed. Patient taking differently: Take 50 mg by mouth every 8 (eight) hours as needed for moderate pain.  02/09/15  Yes Owens Shark, NP  allopurinol (ZYLOPRIM) 300 MG tablet TAKE 1 TABLET BY MOUTH ONCE DAILY Patient not taking: Reported on 02/14/2015 12/27/14   Ladell Pier, MD  ciprofloxacin (CIPRO) 500 MG tablet Take 1 tablet (500 mg total) by mouth daily. Patient not taking: Reported on 02/09/2015 12/15/14   Owens Shark, NP  doxycycline (VIBRA-TABS) 100 MG tablet Take 1 tablet (100 mg total) by mouth 2 (two) times daily. For 14 days. Patient not taking: Reported on 02/14/2015 01/19/15   Ladell Pier, MD  idelalisib (ZYDELIG) 100 MG tablet Take 1 tablet (100 mg total) by mouth daily. Patient not taking: Reported on 02/14/2015 01/04/15   Ladell Pier, MD  promethazine-dextromethorphan (PROMETHAZINE-DM) 6.25-15  MG/5ML syrup Take 5 mLs by mouth 4 (four) times daily as needed for cough. Patient not taking: Reported on 02/14/2015 09/04/14   Rowe Clack, MD   BP 148/62 mmHg  Pulse 81  Temp(Src) 99 F (37.2 C) (Oral)  Resp 16  SpO2 98% Physical Exam  Constitutional: She is oriented to person, place, and time. She appears well-developed and well-nourished. No distress.  HENT:  Head: Normocephalic and atraumatic.  Eyes: Conjunctivae and EOM are normal.  Cardiovascular: Normal rate and regular rhythm.   Pulmonary/Chest: Effort normal and breath sounds normal. No stridor. No respiratory distress.  Abdominal: She exhibits no distension.  Musculoskeletal: She exhibits no edema.       Legs: Neurological: She is alert and oriented to person, place, and time. No cranial nerve deficit.  Skin: Skin is warm and dry.  Psychiatric: She has a normal mood and affect.  Nursing note and vitals reviewed.   ED Course  Procedures (including critical care time) Labs Review Labs Reviewed  CBC WITH DIFFERENTIAL/PLATELET - Abnormal; Notable for the following:    RBC 2.85 (*)    Hemoglobin 9.9 (*)    HCT 29.0 (*)    MCV 101.8 (*)    MCH 34.7 (*)    RDW 23.4 (*)    All other components within normal limits  COMPREHENSIVE METABOLIC PANEL  I-STAT CG4 LACTIC ACID, ED   After initial evaluation discussed patient's case with our oncology colleagues. They spoke with the patient today. With concern for cellulitis, complicated by possible yeast infection, they will follow up with the patient tomorrow after she receives initial antibiotics today.   MDM  Patient with open wound about the gluteal cleft presents with ongoing pain in this area. No evidence for deep space infection, but there is evidence for cellulitis, given the patient's relative neutropenia, she was started on antibiotics, admitted for further evaluation, management. The wound itself appears to have co-infection with yeast, the patient had  topical application of therapy provided in addition to her IV medication.     Carmin Muskrat, MD 02/14/15 [phone]

## 2015-02-14 NOTE — Telephone Encounter (Signed)
[name] called asking "what is your protocol for the wound center.  Have you heard from them because the area on my backside is not getting better.  The nurse from [facility] said someone needs to look at this because it is not a bedsore and it hurts all day.  I can't lie on my back or Tummy.  My daughter is on her way to bring me to the ER/Admitting so I want to know the protocol."  Has not seen PCP about the area on her bottom.  Observed referral has been made but this nurse will call to confirm.  Acknowledged she is in pain and understand to proceed to ER but we need to plan for tomorrow.  Denies eating due to poor appetite.  Advised to eat six small meals with protein to help heal. and drink more fluids.  Instructed to keep ambulatory as much as possible.  Turn every two hours when lying down.  Keep area clean and dry from perspiration, stool and urine.  "I tend to myself well but I live alone can't see the area between my gluteals and the nurse says there is a new area on the rectum."  11:35 [place] [name] [phone]. Spoke with [name] who scheduled 1st available appointment for 8:15 am March 13, 2015.  Wound Center does not use EPIC and no receipt of referral.  This nurse gave referral information to [facility].  12:00 Called patient who is in the ER.  Appointment information given to daughter [name].  Also reviewed information about preventive wound home care with [name].

## 2015-02-14 NOTE — Progress Notes (Signed)
ED CM spoke with pt and female family member to find out pt is being seen by [facility] Cyril Mourning of [facility] confirmed pt active for hhrn for wound care and hh sw scheduled to see pt 02/15/15, tomorrow for community needs and to assist with medicare renewal There are notes indicating  wound specialist has encouraged pt of the need to relieve pressure from sacral site without much success

## 2015-02-14 NOTE — H&P (Signed)
PATIENT DETAILS Name: [name] Age: 81 y.o. Sex: female Date of Birth: Sep 03, 1934 Admit Date: 02/14/2015 FUX:[id number] Asa Lente, MD Referring Physician: Vanita Panda   CHIEF COMPLAINT:  Painful gluteal ulcers 1 week  HPI: [name] is a 81 y.o. female with a Past Medical History of CLL on chemotherapy, hypertension, breast cancer status post left mastectomy on tamoxifen who presents today with the above noted complaint. Per patient, over the past few weeks she is developed ulcers in her gluteal cleft area that have progressively become more painful over the past one week. She recently completed a course of doxycycline without any improvement. She claims that along with worsening pain, she has also had a foul-smelling discharge. She claims the pain to be around 9/10 at its worst over the past few days. She denies any fever, nausea, vomiting or diarrhea. She denies any cough. She denies any shortness of breath. Because of worsening pain and discharge from these ulcers, she presented to the ED, subsequently hospitalist service was consulted for admission.   ALLERGIES:   Allergies  Allergen Reactions  . Obinutuzumab Shortness Of Breath and Rash  . Penicillins Anaphylaxis, Shortness Of Breath, Itching, Swelling and Rash    Swelling all over body and throat   . Latex Itching    PAST MEDICAL HISTORY: Past Medical History  Diagnosis Date  . 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 Stroke 05/2009    05/2009  Right upper extrem/leg weakness; nuchal pain;  . Benign essential HTN   . Macrocytic anemia   . Vertigo     ?TIA - MRI brain 5/13  No new CVA  . Pleurisy     01/16/12 pleuritic right chest pain  V/Q scan [place] [facility] low probability PE  . Breast cancer left T2N0 S/P mastectomy and SLN Bx 09/2010 2009  . CLL (chronic lymphoid leukemia) in relapse     . Arthritis   . Hyperlipidemia   . Thrombocytopenia, unspecified 06/15/2013    PAST SURGICAL HISTORY: Past Surgical History  Procedure Laterality Date  . Tonsillectomy    . Abdominal hysterectomy    . Right parotid gland    . Hemorrhoid surgery    . Breast biopsy Left 2009  . Mastectomy  02/ 2012    left breast  . Porta-cath      right chest    MEDICATIONS AT HOME: Prior to Admission medications   Medication Sig Start Date End Date Taking? Authorizing Provider  acetaminophen (TYLENOL) 500 MG tablet Take 125 mg by mouth every 6 (six) hours as needed. For pain   Yes Historical Provider, MD  acyclovir (ZOVIRAX) 400 MG tablet Take 1 tablet (400 mg total) by mouth daily. 11/03/14  Yes Rowe Clack, MD  amLODipine (NORVASC) 10 MG tablet Take 1 tablet (10 mg total) by mouth daily. 07/07/13  Yes Domenic Polite, MD  atenolol (TENORMIN) 50 MG tablet Take 1 tablet (50 mg total) by mouth daily. 09/13/14  Yes Rowe Clack, MD  calcium carbonate (TUMS - DOSED IN MG ELEMENTAL CALCIUM) 500 MG chewable tablet Chew 1 tablet by mouth as needed for indigestion or heartburn. For stomach   Yes Historical Provider, MD  donepezil (ARICEPT) 5 MG tablet Take 1 tablet (5 mg total) by mouth at bedtime. 09/04/14  Yes Rowe Clack, MD  hydrochlorothiazide (MICROZIDE) 12.5 MG capsule Take  1 capsule (12.5 mg total) by mouth daily. 09/04/14  Yes Rowe Clack, MD  lidocaine-prilocaine (EMLA) cream Apply 1 application topically as needed. Patient taking differently: Apply 1 application topically as needed (for port).  01/04/15  Yes Owens Shark, NP  magnesium hydroxide (MILK OF MAGNESIA) 400 MG/5ML suspension Take 15 mLs by mouth daily as needed for mild constipation.   Yes Historical Provider, MD  meclizine (ANTIVERT) 12.5 MG tablet Take 1 tablet (12.5 mg total) by mouth 3 (three) times daily as needed for dizziness. 09/04/14  Yes Rowe Clack, MD  pantoprazole (PROTONIX) 40 MG tablet TAKE 1  TABLET (40 MG TOTAL) BY MOUTH DAILY. 10/09/14  Yes Rowe Clack, MD  potassium chloride SA (KLOR-CON M20) 20 MEQ tablet Take 1 tablet (20 mEq total) by mouth daily. Patient taking differently: Take 20 mEq by mouth daily as needed (for cramps).  09/04/14  Yes Rowe Clack, MD  prochlorperazine (COMPAZINE) 10 MG tablet Take 1 tablet (10 mg total) by mouth every 6 (six) hours as needed. For nausea Patient taking differently: Take 10 mg by mouth every 6 (six) hours as needed for nausea.  01/26/15  Yes Owens Shark, NP  tamoxifen (NOLVADEX) 20 MG tablet TAKE 1 TABLET (20 MG TOTAL) BY MOUTH DAILY. 01/26/15  Yes Owens Shark, NP  traMADol (ULTRAM) 50 MG tablet Take 1 tablet (50 mg total) by mouth every 8 (eight) hours as needed. Patient taking differently: Take 50 mg by mouth every 8 (eight) hours as needed for moderate pain.  02/09/15  Yes Owens Shark, NP  allopurinol (ZYLOPRIM) 300 MG tablet TAKE 1 TABLET BY MOUTH ONCE DAILY Patient not taking: Reported on 02/14/2015 12/27/14   Ladell Pier, MD  ciprofloxacin (CIPRO) 500 MG tablet Take 1 tablet (500 mg total) by mouth daily. Patient not taking: Reported on 02/09/2015 12/15/14   Owens Shark, NP  doxycycline (VIBRA-TABS) 100 MG tablet Take 1 tablet (100 mg total) by mouth 2 (two) times daily. For 14 days. Patient not taking: Reported on 02/14/2015 01/19/15   Ladell Pier, MD  idelalisib (ZYDELIG) 100 MG tablet Take 1 tablet (100 mg total) by mouth daily. Patient not taking: Reported on 02/14/2015 01/04/15   Ladell Pier, MD  promethazine-dextromethorphan (PROMETHAZINE-DM) 6.25-15 MG/5ML syrup Take 5 mLs by mouth 4 (four) times daily as needed for cough. Patient not taking: Reported on 02/14/2015 09/04/14   Rowe Clack, MD    FAMILY HISTORY: Family History  Problem Relation Age of Onset  . Breast cancer Mother   . Hyperlipidemia    . Stroke    . Hypertension    . Arthritis      SOCIAL HISTORY:  reports that she has never  smoked. She has never used smokeless tobacco. She reports that she does not drink alcohol or use illicit drugs. Lives at: Home Mobility: Cane/Walker  REVIEW OF SYSTEMS:  Constitutional:   No  weight loss, night sweats,  Fevers, chills, fatigue.  HEENT:    No headaches, Dysphagia,Tooth/dental problems,Sore throat,   Cardio-vascular: No chest pain,Orthopnea, PND,lower extremity edema, anasarca, palpitations  GI:  No heartburn, indigestion, abdominal pain, nausea, vomiting, diarrhea, melena or hematochezia  Resp: No shortness of breath, cough, hemoptysis,plueritic chest pain.   Skin:  No rash or lesions.  GU:  No dysuria, change in color of urine, no urgency or frequency.  No flank pain.  Musculoskeletal: No joint pain or swelling.  No decreased range of motion.  No back pain.  Endocrine: No heat intolerance, no cold intolerance, no polyuria, no polydipsia  Psych: No change in mood or affect. No depression or anxiety.  No memory loss.   PHYSICAL EXAM: Blood pressure 148/62, pulse 81, temperature 99 F (37.2 C), temperature source Oral, resp. rate 16, SpO2 98 %.  General appearance :Awake, alert, not in any distress. Speech Clear. Not toxic Looking HEENT: Atraumatic and Normocephalic, pupils equally reactive to light and accomodation Neck: supple, no JVD. No cervical lymphadenopathy.  Chest:Good air entry bilaterally, no added sounds  CVS: S1 S2 regular, no murmurs.  Abdomen: Bowel sounds present, Non tender and not distended with no gaurding, rigidity or rebound. Around 3 ulcerations in the gluteal cleft area with mild cellulitic surrounding areas, status post discharge-but not foul-smelling. All these ulcerations exquisitely tender. No crepitus, no fluctuant areas seen. Extremities: B/L Lower Ext shows no edema, both legs are warm to touch Neurology:  Non focal Skin:No Rash Wounds:N/A  LABS ON ADMISSION:   Recent Labs  02/14/15 1428  NA 136  K 4.8  CL 101  CO2  26  GLUCOSE 107*  BUN 16  CREATININE 1.11*  CALCIUM 9.0    Recent Labs  02/14/15 1428  AST 28  ALT 28  ALKPHOS 66  BILITOT 1.1  PROT 6.1*  ALBUMIN 3.6   No results for input(s): LIPASE, AMYLASE in the last 72 hours.  Recent Labs  02/14/15 1428  WBC 5.4  NEUTROABS 0.1*  HGB 9.9*  HCT 29.0*  MCV 101.8*  PLT 70*   No results for input(s): CKTOTAL, CKMB, CKMBINDEX, TROPONINI in the last 72 hours. No results for input(s): DDIMER in the last 72 hours. Invalid input(s): POCBNP   RADIOLOGIC STUDIES ON ADMISSION: No results found.  EKG: Not done  ASSESSMENT AND PLAN: . Painful ulcers of multiple sites of buttock with possible cellulitis: Admit to MedSurg unit, start IV vancomycin. Wound care consultation. Given immunocompromised status-an unknown etiology of this ulcer-Will consult general surgery tomorrow morning for evaluation for wound care recommendations and may be a possible biopsy.   . Breast cancer left T2N0 S/P mastectomy and SLN Bx 09/2010: Continue with tamoxifen   . Benign essential HTN: Blood pressure currently controlled-continue home medications. Adjust and titrate accordingly.   Marland Kitchen CLL (chronic lymphocytic leukemia): EDMD spoke with oncology (Dr Jearld Shines will provide further recommendations in a.m.   Marland Kitchen Memory loss/mild dementia: Continue Aricept  Further plan will depend as patient's clinical course evolves and further radiologic and laboratory data become available. Patient will be monitored closely.  Please note above plan was formulated after personal review and summarization of most recent outpatient records.    Above noted plan was discussed with patient at bedside, they were in agreement.   CONSULTS: Wound   care  DVT Prophylaxis: SCD's  Code Status: Full Code  Disposition Plan:  Discharge back home in 2-3 days   Total time spent 55 minutes.Greater than 50% of this time was spent in counseling, explanation of diagnosis, planning of  further management, and coordination of care.  [facility] Pager [phone]  If 7PM-7AM, please contact night-coverage [email or web address] Password [facility] 02/14/2015, 4:38 PM

## 2015-02-15 ENCOUNTER — Encounter (HOSPITAL_COMMUNITY): Payer: Self-pay | Admitting: General Surgery

## 2015-02-15 DIAGNOSIS — D61818 Other pancytopenia: Secondary | ICD-10-CM

## 2015-02-15 DIAGNOSIS — I10 Essential (primary) hypertension: Secondary | ICD-10-CM

## 2015-02-15 DIAGNOSIS — R52 Pain, unspecified: Secondary | ICD-10-CM

## 2015-02-15 DIAGNOSIS — C9112 Chronic lymphocytic leukemia of B-cell type in relapse: Secondary | ICD-10-CM

## 2015-02-15 DIAGNOSIS — C50911 Malignant neoplasm of unspecified site of right female breast: Secondary | ICD-10-CM

## 2015-02-15 DIAGNOSIS — D709 Neutropenia, unspecified: Secondary | ICD-10-CM | POA: Insufficient documentation

## 2015-02-15 LAB — CBC
HCT: 23.7 % — ABNORMAL LOW (ref 36.0–46.0)
HEMOGLOBIN: 8.1 g/dL — AB (ref 12.0–15.0)
MCH: 34.8 pg — AB (ref 26.0–34.0)
MCHC: 34.2 g/dL (ref 30.0–36.0)
MCV: 101.7 fL — AB (ref 78.0–100.0)
Platelets: 55 10*3/uL — ABNORMAL LOW (ref 150–400)
RBC: 2.33 MIL/uL — ABNORMAL LOW (ref 3.87–5.11)
RDW: 23.6 % — ABNORMAL HIGH (ref 11.5–15.5)
WBC: 3.7 10*3/uL — ABNORMAL LOW (ref 4.0–10.5)

## 2015-02-15 LAB — BASIC METABOLIC PANEL
Anion gap: 8 (ref 5–15)
BUN: 13 mg/dL (ref 6–20)
CHLORIDE: 100 mmol/L — AB (ref 101–111)
CO2: 24 mmol/L (ref 22–32)
Calcium: 8 mg/dL — ABNORMAL LOW (ref 8.9–10.3)
Creatinine, Ser: 1 mg/dL (ref 0.44–1.00)
GFR calc non Af Amer: 53 mL/min — ABNORMAL LOW (ref >60)
GLUCOSE: 100 mg/dL — AB (ref 65–99)
POTASSIUM: 3.8 mmol/L (ref 3.5–5.1)
SODIUM: 132 mmol/L — AB (ref 135–145)

## 2015-02-15 MED ORDER — OXYCODONE-ACETAMINOPHEN 7.5-325 MG PO TABS
1.0000 | ORAL_TABLET | Freq: Four times a day (QID) | ORAL | Status: DC | PRN
  Administered 2015-02-16 – 2015-02-17 (×3): 1 via ORAL
  Filled 2015-02-15 (×3): qty 1

## 2015-02-15 MED ORDER — DEXTROSE 5 % IV SOLN
1.0000 g | Freq: Three times a day (TID) | INTRAVENOUS | Status: DC
  Administered 2015-02-15 – 2015-02-17 (×6): 1 g via INTRAVENOUS
  Filled 2015-02-15 (×7): qty 1

## 2015-02-15 MED ORDER — SODIUM CHLORIDE 0.9 % IJ SOLN
10.0000 mL | INTRAMUSCULAR | Status: DC | PRN
  Administered 2015-02-15 – 2015-02-17 (×2): 10 mL
  Filled 2015-02-15: qty 40

## 2015-02-15 MED ORDER — TBO-FILGRASTIM 480 MCG/0.8ML ~~LOC~~ SOSY: 480.0000 ug | PREFILLED_SYRINGE | Freq: Once | SUBCUTANEOUS | Status: DC

## 2015-02-15 MED ORDER — TBO-FILGRASTIM 480 MCG/0.8ML ~~LOC~~ SOSY
480.0000 ug | PREFILLED_SYRINGE | Freq: Every day | SUBCUTANEOUS | Status: DC
  Administered 2015-02-15 – 2015-02-16 (×2): 480 ug via SUBCUTANEOUS
  Filled 2015-02-15 (×5): qty 0.8

## 2015-02-15 MED ORDER — BARRIER CREAM NON-SPECIFIED
1.0000 "application " | TOPICAL_CREAM | Freq: Three times a day (TID) | TOPICAL | Status: DC | PRN
  Filled 2015-02-15: qty 1

## 2015-02-15 NOTE — Progress Notes (Signed)
ANTIBIOTIC CONSULT NOTE - FOLLOW UP  Pharmacy Consult for vancomycin/Zosyn Indication: cellulitis/wound infection  Allergies  Allergen Reactions  . Obinutuzumab Shortness Of Breath and Rash  . Penicillins Anaphylaxis, Shortness Of Breath, Itching, Swelling and Rash    Swelling all over body and throat   . Latex Itching    Patient Measurements: Height: 5\' 3"  (160 cm) Weight: 211 lb 10.3 oz (96 kg) IBW/kg (Calculated) : 52.4  Vital Signs: Temp: 98.7 F (37.1 C) (05/19 1543) Temp Source: Oral (05/19 1543) BP: 164/61 mmHg (05/19 1543) Pulse Rate: 68 (05/19 1543) Intake/Output from previous day:   Intake/Output from this shift: Total I/O In: 360 [P.O.:360] Out: -   Labs:  Recent Labs  02/14/15 1428 02/15/15 0630  WBC 5.4 3.7*  HGB 9.9* 8.1*  PLT 70* 55*  CREATININE 1.11* 1.00   Estimated Creatinine Clearance: 51.1 mL/min (by C-G formula based on Cr of 1). No results for input(s): VANCOTROUGH, VANCOPEAK, VANCORANDOM, GENTTROUGH, GENTPEAK, GENTRANDOM, TOBRATROUGH, TOBRAPEAK, TOBRARND, AMIKACINPEAK, AMIKACINTROU, AMIKACIN in the last 72 hours.   Microbiology: Recent Results (from the past 720 hour(s))  TECHNOLOGIST REVIEW     Status: None   Collection Time: 01/19/15 10:30 AM  Result Value Ref Range Status   Technologist Review Variant lymphs present and few smudge cells  Final  TECHNOLOGIST REVIEW     Status: None   Collection Time: 01/26/15  2:20 PM  Result Value Ref Range Status   Technologist Review Variant lymphs present. few smudge cells  Final  TECHNOLOGIST REVIEW     Status: None   Collection Time: 02/09/15 10:24 AM  Result Value Ref Range Status   Technologist Review Variant lymphs present  Final    Anti-infectives    Start     Dose/Rate Route Frequency Ordered Stop   02/14/15 1800  acyclovir (ZOVIRAX) tablet 400 mg     400 mg Oral Daily 02/14/15 1751     02/14/15 1600  vancomycin (VANCOCIN) 1,500 mg in sodium chloride 0.9 % 500 mL IVPB     1,500  mg 250 mL/hr over 120 Minutes Intravenous Every 24 hours 02/14/15 1528        Assessment: 81 y.o. female admitted 02/14/2015 with hx of buttock wound and has been receiving wound care PTA. She presents to the ED today for further management of wound as site does not appear to be improving.Started on vancomycin yesterday; now to add aztreonam, presumably for gram negative coverage.  SCr wnl, afebrile; WBC low  5/18 >> vancomycin >> 5/19 >> Azactam >>    No cultures obtained  Goal of Therapy:  Vancomycin trough level 10-15 mcg/ml  Eradication of infection Appropriate antibiotic dosing for indication and renal function  Plan:  Day 2 antibiotics (day 1 aztreonam)  Azactam 1g IV q8 hr  Continue vancomycin as ordered; measure trough levels as appropriate  Follow clinical course, renal function, culture results as available  Follow for de-escalation of antibiotics and LOT   Reuel Boom, PharmD Pager: [phone] 02/15/2015, 4:36 PM

## 2015-02-15 NOTE — Progress Notes (Signed)
PATIENT DETAILS Name: [name] Age: 81 y.o. Sex: female Date of Birth: 06-25-1934 Admit Date: 02/14/2015 Admitting Physician Evalee Mutton Kristeen Mans, MD [id number] Asa Lente, MD  Subjective: Continues to have pain in her gluteal area.  Assessment/Plan: Principal Problem:   Skin ulcer of multiple sites of buttock: Etiology unknown-think that this is related to skin breakdown from excoriation and poor wound healing given immunocompromise status. Spoke with oncology-Dr. Benay Spice who did not think that this was related to underlying CLL or anti neoplastic meds. Have consulted general surgery for further assistance and evaluation.? Role for biopsy. In interim, cautiously continue with vancomycin-but suspect will need local wound care on discharge.  Active Problems:   Breast cancer left T2N0 S/P mastectomy and SLN Bx 09/2010:Continue with tamoxifen    Pancytopenia: Secondary to underlying CLL-chronic issue and not very different from usual baseline.Dr Benay Spice to provide further recommendations-however patient has [facility] been [facility] to see if any further recommendations-but not much to offer at this point.    CLL (chronic lymphocytic leukemia): Patient was on on rituximab and idelalisib this admission-apparently this is on hold due to progressive gluteal ulceration and persistent neutropenia. Oncology will follow.    Benign essential HTN: Controlled-continue amlodipine, atenolol and HCTZ    Vertigo: Continue with as needed meclizine. No dizziness at this time  Disposition: Remain inpatient-home with HHRN 1-2 days  Antimicrobial agents  See below  Anti-infectives    Start     Dose/Rate Route Frequency Ordered Stop   02/14/15 1800  acyclovir (ZOVIRAX) tablet 400 mg     400 mg Oral Daily 02/14/15 1751     02/14/15 1600  vancomycin (VANCOCIN) 1,500 mg in sodium chloride 0.9 % 500 mL IVPB     1,500 mg 250 mL/hr over 120 Minutes Intravenous Every 24 hours  02/14/15 1528        DVT Prophylaxis:  SCD's-has thrombocytopenia  Code Status: Full code   Family Communication None at bedside  Procedures: None  CONSULTS:  hematology/oncology and general surgery  Time spent 30 minutes-Greater than 50% of this time was spent in counseling, explanation of diagnosis, planning of further management, and coordination of care.  MEDICATIONS: Scheduled Meds: . acyclovir  400 mg Oral Daily  . amLODipine  10 mg Oral Daily  . atenolol  50 mg Oral Daily  . clotrimazole   Topical BID  . donepezil  5 mg Oral QHS  . hydrochlorothiazide  12.5 mg Oral Daily  . pantoprazole  40 mg Oral Daily  . polyethylene glycol  17 g Oral Daily  . tamoxifen  20 mg Oral Daily  . vancomycin  1,500 mg Intravenous Q24H   Continuous Infusions:  PRN Meds:.acetaminophen **OR** acetaminophen, albuterol, bisacodyl, calcium carbonate, guaiFENesin-dextromethorphan, meclizine, morphine injection, ondansetron **OR** ondansetron (ZOFRAN) IV, oxyCODONE, sodium chloride, traMADol    PHYSICAL EXAM: Vital signs in last 24 hours: Filed Vitals:   02/14/15 1808 02/14/15 1814 02/14/15 2100 02/15/15 0607  BP: 150/66  163/61 121/56  Pulse: 71  87 69  Temp: 98.8 F (37.1 C)   98.4 F (36.9 C)  TempSrc: Oral  Oral Oral  Resp: 20  18 16   Height:  5\' 3"  (1.6 m)    Weight:  96 kg (211 lb 10.3 oz)    SpO2: 98%  99% 99%    Weight change:  Filed Weights   02/14/15 1814  Weight: 96 kg (211 lb 10.3 oz)  Body mass index is 37.5 kg/(m^2).   Gen Exam: Awake and alert with clear speech. Neck: Supple, No JVD.   Chest: B/L Clear.   CVS: S1 S2 Regular, no murmurs.  Abdomen: soft, BS +, non tender, non distended.  Extremities: no edema, lower extremities warm to touch. Neurologic: Non Focal.   Skin: No Rash.    Intake/Output from previous day:  Intake/Output Summary (Last 24 hours) at 02/15/15 1149 Last data filed at 02/15/15 0932  Gross per 24 hour  Intake    240 ml    Output      0 ml  Net    240 ml     LAB RESULTS: CBC  Recent Labs Lab 02/09/15 1024 02/14/15 1428 02/15/15 0630  WBC 10.6* 5.4 3.7*  HGB 7.5* 9.9* 8.1*  HCT 22.5* 29.0* 23.7*  PLT 64* 70* 55*  MCV 108.3* 101.8* 101.7*  MCH 36.3* 34.7* 34.8*  MCHC 33.5 34.1 34.2  RDW 29.4* 23.4* 23.6*  LYMPHSABS 9.3* 4.3*  --   MONOABS 0.7 0.8  --   EOSABS 0.1 0.1  --   BASOSABS 0.1 0.1  --     Chemistries   Recent Labs Lab 02/09/15 1024 02/14/15 1428 02/15/15 0630  NA 138 136 132*  K 4.2 4.8 3.8  CL  --  101 100*  CO2 24 26 24   GLUCOSE 143* 107* 100*  BUN 19.1 16 13   CREATININE 1.4* 1.11* 1.00  CALCIUM 8.6 9.0 8.0*    CBG: No results for input(s): GLUCAP in the last 168 hours.  GFR Estimated Creatinine Clearance: 51.1 mL/min (by C-G formula based on Cr of 1).  Coagulation profile No results for input(s): INR, PROTIME in the last 168 hours.  Cardiac Enzymes No results for input(s): CKMB, TROPONINI, MYOGLOBIN in the last 168 hours.  Invalid input(s): CK  Invalid input(s): POCBNP No results for input(s): DDIMER in the last 72 hours. No results for input(s): HGBA1C in the last 72 hours. No results for input(s): CHOL, HDL, LDLCALC, TRIG, CHOLHDL, LDLDIRECT in the last 72 hours. No results for input(s): TSH, T4TOTAL, T3FREE, THYROIDAB in the last 72 hours.  Invalid input(s): FREET3 No results for input(s): VITAMINB12, FOLATE, FERRITIN, TIBC, IRON, RETICCTPCT in the last 72 hours. No results for input(s): LIPASE, AMYLASE in the last 72 hours.  Urine Studies No results for input(s): UHGB, CRYS in the last 72 hours.  Invalid input(s): UACOL, UAPR, USPG, UPH, UTP, UGL, UKET, UBIL, UNIT, UROB, ULEU, UEPI, UWBC, URBC, UBAC, CAST, UCOM, BILUA  MICROBIOLOGY: Recent Results (from the past 240 hour(s))  TECHNOLOGIST REVIEW     Status: None   Collection Time: 02/09/15 10:24 AM  Result Value Ref Range Status   Technologist Review Variant lymphs present  Final     RADIOLOGY STUDIES/RESULTS: No results found.  Oren Binet, MD  [facility] Pager:[phone]  If 7PM-7AM, please contact night-coverage [email or web address] Password [email or web address] 02/15/2015, 11:49 AM   LOS: 1 day

## 2015-02-15 NOTE — Progress Notes (Signed)
[facility]  Patient Status: Active (receiving services up to time of hospitalization)  [facility] is providing the following services: RN and MSW  If patient discharges after hours, please call [phone].   [name] 02/15/2015, 2:02 PM

## 2015-02-15 NOTE — Care Management Note (Addendum)
Case Management Note  Patient Details  Name: [name] MRN: [id number] Date of Birth: [birth year]  Subjective/Objective:    81 yo female admitted with multiple skin ulcers, possible cellulitis                Action/Plan: Patient is requesting SNF placement and CSW is working with patient regarding SNF placement options. Please notify CM if patient unable to be placed in SNF for [facility] needs. Thank you.  Patient stated that she  Lives at home alone. She has a walker, cane, BSC, shower chair. Shw has a daughter that lives locally but is limited in her support because she works. The patient follows up with Dr. Benay Spice and was receiving [facility] RN services 2x/week with [facility]. She stated that Dr. Benay Spice has made an appointment at a wound care center (she is not sure which one) on July 12th. Will continue to follow for discharge needs. Expected Discharge Date:   (UNKNOWN)               Expected Discharge Plan:  [place]  In-House Referral:     Discharge planning Services  CM Consult  Post Acute Care Choice:  Home Health Choice offered to:  Patient  DME Arranged:    DME Agency:     HH Arranged:    [place] Agency:     Status of Service:  In process, will continue to follow  Medicare Important Message Given:    Date Medicare IM Given:    Medicare IM give by:    Date Additional Medicare IM Given:    Additional Medicare Important Message give by:     If discussed at [place] of Stay Meetings, dates discussed:    Additional Comments:  Scot Dock, RN 02/15/2015, 2:02 PM

## 2015-02-15 NOTE — Consult Note (Signed)
WOC wound consult note Reason for Consult: Asked to evaluate and make recommendations to Moisture Associated Skin damage to bilateral gluteal folds and inguinal areas.  Surgery has consulted and will not intervene at this time.  Wound type: Moisture Associated Skin Damage (MASD) to bilateral gluteal folds. Began chemotherapy 5 weeks ago.   Pressure Ulcer POA: N/A Measurement: Generalized erythema to bilateral gluteal folds.  4.5 cm x 1 cm denuded skin (partial thickness).  Minimal serous weeping.  Very tender to touch.  Wound bed:100% pale pink Drainage (amount, consistency, odor) MInimal serous drainage.  No odor.  Periwound:Erythema Dressing procedure/placement/frequency:Cleanse bilateral buttocks with soap and water and pat gently dry.  Apply InterDry moisture wicking fabric to gluteal fold and inguinal/groin dednuded areas.  Measure and cut length of InterDry Ag+ to fit in skin folds that have skin breakdown Tuck InterDry  Ag+ fabric into skin folds in a single layer, allow for 2 inches of overhang from skin edges to allow for wicking to occur May remove to bathe; dry area thoroughly and then tuck into affected areas again Do not apply any creams or ointments when using InterDry Ag+ DO NOT THROW AWAY FOR 5 DAYS unless soiled with stool DO NOT [facility] product, this will inactivate the silver in the material  New sheet of Interdry Ag+ should be applied after 5 days of use if patient continues to have skin breakdown    Will not follow at this time.  Please re-consult if needed.  Domenic Moras RN BSN [place] Pager [phone]

## 2015-02-15 NOTE — Progress Notes (Signed)
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 Denies fevers, chills, night sweats, vision changes, or mucositis. Denies any respiratory complaints. Denies any chest pain or palpitations. Denies lower extremity swelling. Denies nausea, heartburn or change in bowel habits. Denies any dysuria. Denies other abnormal skin rashes, or neuropathy. Denies any bleeding issues such as epistaxis, hematemesis, hematuria or hematochezia. Ambulating without difficulty. She was started on IV Vancomycin, and a wound care and surgical consultation was obtained, who recommend local wound care, without a need for biopsy, as this is likely related to pressure. We were kindly informed of the patient's admission  Brief Oncological History 2. Relapsed CLL/well-differentiated lymphocytic lymphoma. Initial diagnosis dates to November 2009. Initially treated with modified fludarabine, Cytoxan and Rituxan with no response. No response to Campath. Partial response to Arzerra initially given between April and June 2010 and monthly through October 2010. In May 2013 there was deterioration of the blood counts. Arzerra was resumed and she again had a partial response. Blood counts began to fall again in August 2014. She began a trial of Ibrutinib 05/27/2013. It was stopped on 06/10/2013 due to profound fatigue, nausea and anorexia. In addition, labs on  06/10/2013 showed significant deterioration in blood counts with the platelet count down to 3000, felt to be secondary to disease progression as this was how she initially presented. Treatment was initiated with Obinutuzumab and Leukeran on 06/23/2013. Blood counts improved. Obinutuzumab discontinued following cycle 3 due to development of chest tightness requiring hospitalization. She ruled out for an MI. Low dose daily chlorambucil continued until 01/09/2014 when it was placed on hold due to progressive neutropenia/thrombocytopenia.  Bone marrow biopsy 09/06/2014 confirmed involvement with CLL  Initiation of Rituxan/Idelalisib 12/01/2014  Cycle 2 Rituxan 12/22/2014  Cycle 3 Rituxan 01/12/2015  Cycle 4 Rituxan 02/02/2015.              Idelalisib placed on hold since 03/12/15 due to persistent pancytopenia and non-healing wound at the gluteal fold 2. Extreme fatigue, anorexia while on Ibrutinib. 3. Multifocal invasive and noninvasive cancers left breast status post mastectomy 10/09/2010 (mpT2,mpN0) currently on tamoxifen. 4. Hypertension. 5. Recurrent atypical neurologic symptoms with dizziness, vertigo, paresthesias. 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 Mild changes in small vessels. No acute changes. Aspirin placed on hold due to severe thrombocytopenia. 6. Pancytopenia secondary to CLL involving the bone marrow.  7. Progressive symptomatic anemia: s/p transfusion 2 units on 02/10/15  8. Elevated creatinine (1.7) 11/21/2014. Renal ultrasound 11/24/2014 showed mild left hydronephrosis. No renal calculi identified. CT scans 12/08/2014 showed marked progression of lymphadenopathy in the abdomen and pelvis. Kidneys and ureters appeared normal. 9. Superficial skin breakdown at the upper gluteal fold. She completed a course of doxycycline. Persistent superficial ulceration 01/26/2015. She is followed by a wound nurse.          Scheduled Meds: . acyclovir   400 mg Oral Daily  . amLODipine  10  mg Oral Daily  . atenolol  50 mg Oral Daily  . clotrimazole   Topical BID  . donepezil  5 mg Oral QHS  . hydrochlorothiazide  12.5 mg Oral Daily  . pantoprazole  40 mg Oral Daily  . polyethylene glycol  17 g Oral Daily  . tamoxifen  20 mg Oral Daily  . vancomycin  1,500 mg Intravenous Q24H   Continuous Infusions:  PRN Meds:acetaminophen **OR** acetaminophen, albuterol, bisacodyl, calcium carbonate, guaiFENesin-dextromethorphan, meclizine, morphine injection, ondansetron **OR** ondansetron (ZOFRAN) IV, oxyCODONE, sodium chloride, traMADol   Objective:  Filed Vitals:   02/15/15 0607  BP: 121/56  Pulse: 69  Temp: 98.4 F (36.9 C)  Resp: 16      Intake/Output Summary (Last 24 hours) at 02/15/15 1334 Last data filed at 02/15/15 0932  Gross per 24 hour  Intake    240 ml  Output      0 ml  Net    240 ml     GENERAL:alert, no distress and comfortable SKIN:  Known gluteal fold ulceration extending to perineum, worse from prior exam and one in the right inguinal crease. Also, one area or erythematous nodularity on the left anterior chest. EYES: normal, conjunctiva are pink and non-injected, sclera clear OROPHARYNX:no exudate, no erythema and lips, buccal mucosa, and tongue normal  NECK: supple, thyroid normal size, non-tender, without nodularity LYMPH:  no palpable lymphadenopathy in the cervical, 1cm right axillary/ chest wall lymph node, 2.5 cm left axillary lymph node or inguinal LUNGS: clear to auscultation and percussion with normal breathing effort HEART: regular rate & rhythm and no murmurs and no lower extremity edema ABDOMEN: soft, non-tender and normal bowel sounds Musculoskeletal:no cyanosis of digits and no clubbing  PSYCH: alert & oriented x 3 with fluent speech NEURO: no focal motor/sensory deficits    CBG (last 3)  No results for input(s): GLUCAP in the last 72 hours.   Labs:   Recent Labs Lab 02/09/15 1024  02/14/15 1428 02/15/15 0630  WBC 10.6* 5.4 3.7*  HGB 7.5* 9.9* 8.1*  HCT 22.5* 29.0* 23.7*  PLT 64* 70* 55*  MCV 108.3* 101.8* 101.7*  MCH 36.3* 34.7* 34.8*  MCHC 33.5 34.1 34.2  RDW 29.4* 23.4* 23.6*  LYMPHSABS 9.3* 4.3*  --   MONOABS 0.7 0.8  --   EOSABS 0.1 0.1  --   BASOSABS 0.1 0.1  --      Chemistries:    Recent Labs Lab 02/09/15 1024 02/14/15 1428 02/15/15 0630  NA 138 136 132*  K 4.2 4.8 3.8  CL  --  101 100*  CO2 24 26 24   GLUCOSE 143* 107* 100*  BUN 19.1 16 13   CREATININE 1.4* 1.11* 1.00  CALCIUM 8.6 9.0 8.0*  AST 24 28  --   ALT 25 28  --   ALKPHOS 67 66  --   BILITOT 0.44 1.1  --     GFR Estimated Creatinine Clearance: 51.1 mL/min (by C-G formula based on Cr of 1).  Liver Function Tests:  Recent Labs Lab 02/09/15 1024 02/14/15 1428  AST 24 28  ALT 25 28  ALKPHOS 67 66  BILITOT 0.44 1.1  PROT 5.7* 6.1*  ALBUMIN 3.3* 3.6   Microbiology Recent Results (from the past 240 hour(s))  TECHNOLOGIST REVIEW     Status: None   Collection Time: 02/09/15 10:24 AM  Result Value Ref Range Status   Technologist Review Variant lymphs present  Final       Imaging Studies:  No  results found.  Assessment/Plan: 80 y.o.   Relapsed CLL/well-differentiated lymphocytic lymphoma S/p C4 Rituxan on 5/6 without Idelalisib, due to pancytopenia and skin breakdown.   Pancytopenia involving the bone marrow In the setting of recent chemotherapy, antibiotics No transfusion is indicated at this time as inpatient-last transfusion of blood on 5/14, 2 units Monitor counts closely Transfuse blood to maintain a Hb of 8 g or if the patient is acutely bleeding, and unit of platelets if count is less or equal than 10,000 or 20,000 if the patient is acutely bleeding Will consider Granix if ANC is less than 1.0  Multifocal invasive and noninvasive cancers left breast status post mastectomy 10/09/2010 (mpT2,mpN0)  currently on tamoxifen.  Superficial skin breakdown at  the upper gluteal fold, persistent Pan due to skin breakdown She completed a course of doxycycline.  She is now on Vancomycin IV Appreciate Surgery and Wound Care evaluation, who recommend local wound care, no indication for biopsy Will provide stronger pain control for symptom relief.   Full Code  DVT prophylaxis On SCDs    Other medical issues as per admitting team     [facility] E, PA-C 02/15/2015  1:34 PM   Ms. Schwall was interviewed and examined. She has persistent pancytopenia secondary to CLL. Sweats and palpable lymphadenopathy improved when she was started on Idelalisib and Rituxan. She has significant progression of ulceration at the gluteal fold with a new area of skin breakdown in the right groin. I doubt the skin breakdown is directly related to the systemic therapy. However wound healing may be delayed with the severe leukopenia.  Recommendations: 1. Hold idelalisib 2. Wound care Center evaluation 3. I will begin a trial of G-CSF 4. Continue antibiotics 5. Outpatient follow-up will be scheduled at the [facility]

## 2015-02-15 NOTE — Consult Note (Signed)
[name] Dec 07, 1933  [id number].   Primary Oncologist: Dr. [name]  Requesting MD: Dr. Oren Binet Chief Complaint/Reason for Consult: buttock wounds HPI: This is a pleasant 81 yo black female who was admitted yesterday due to painful buttock wounds.  She was diagnosed with CLL and started chemotherapy 5-6 weeks ago.  Since she started chemotherapy she has been developing these sores on her buttock.  She has noticed some drainage.  Her pain continues to worsen and she now has some areas in her inguinal crease.  We have been asked to evaluate these areas for further recommendations.  ROS : Please see HPI, otherwise currently negative  Family History  Problem Relation Age of Onset  . Breast cancer Mother   . Hyperlipidemia    . Stroke    . Hypertension    . Arthritis      Past Medical History  Diagnosis Date  . 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 Stroke 05/2009    05/2009  Right upper extrem/leg weakness; nuchal pain;  . Benign essential HTN   . Macrocytic anemia   . Vertigo     ?TIA - MRI brain 5/13  No new CVA  . Pleurisy     01/16/12 pleuritic right chest pain  V/Q scan [place] [facility] low probability PE  . Breast cancer left T2N0 S/P mastectomy and SLN Bx 09/2010 2009  . CLL (chronic lymphoid leukemia) in relapse   . Arthritis   . Hyperlipidemia   . Thrombocytopenia, unspecified 06/15/2013    Past Surgical History  Procedure Laterality Date  . Tonsillectomy    . Abdominal hysterectomy    . Right parotid gland    . Hemorrhoid surgery    . Breast biopsy Left 2009  . Mastectomy  02/ 2012    left breast  . Porta-cath      right chest    Social History:  reports that she has never smoked. She has never used smokeless tobacco. She reports that she does not drink alcohol or use illicit drugs.  Allergies:  Allergies  Allergen Reactions   . Obinutuzumab Shortness Of Breath and Rash  . Penicillins Anaphylaxis, Shortness Of Breath, Itching, Swelling and Rash    Swelling all over body and throat   . Latex Itching    Medications Prior to Admission  Medication Sig Dispense Refill  . acetaminophen (TYLENOL) 500 MG tablet Take 125 mg by mouth every 6 (six) hours as needed. For pain    . acyclovir (ZOVIRAX) 400 MG tablet Take 1 tablet (400 mg total) by mouth daily. 30 tablet 4  . amLODipine (NORVASC) 10 MG tablet Take 1 tablet (10 mg total) by mouth daily. 30 tablet 0  . atenolol (TENORMIN) 50 MG tablet Take 1 tablet (50 mg total) by mouth daily. 90 tablet 1  . calcium carbonate (TUMS - DOSED IN MG ELEMENTAL CALCIUM) 500 MG chewable tablet Chew 1 tablet by mouth as needed for indigestion or heartburn. For stomach    . donepezil (ARICEPT) 5 MG tablet Take 1 tablet (5 mg total) by mouth at bedtime. 90 tablet 3  . hydrochlorothiazide (MICROZIDE) 12.5 MG capsule Take 1 capsule (12.5 mg total) by mouth daily. 90 capsule 3  . lidocaine-prilocaine (EMLA) cream Apply 1 application topically as needed. (Patient taking differently: Apply 1 application topically as needed (for port). ) 30 g 2  . magnesium  hydroxide (MILK OF MAGNESIA) 400 MG/5ML suspension Take 15 mLs by mouth daily as needed for mild constipation.    . meclizine (ANTIVERT) 12.5 MG tablet Take 1 tablet (12.5 mg total) by mouth 3 (three) times daily as needed for dizziness. 90 tablet 1  . pantoprazole (PROTONIX) 40 MG tablet TAKE 1 TABLET (40 MG TOTAL) BY MOUTH DAILY. 90 tablet 3  . potassium chloride SA (KLOR-CON M20) 20 MEQ tablet Take 1 tablet (20 mEq total) by mouth daily. (Patient taking differently: Take 20 mEq by mouth daily as needed (for cramps). ) 90 tablet 3  . prochlorperazine (COMPAZINE) 10 MG tablet Take 1 tablet (10 mg total) by mouth every 6 (six) hours as needed. For nausea (Patient taking differently: Take 10 mg by mouth every 6 (six) hours as needed for nausea. )  30 tablet 4  . tamoxifen (NOLVADEX) 20 MG tablet TAKE 1 TABLET (20 MG TOTAL) BY MOUTH DAILY. 30 tablet 4  . traMADol (ULTRAM) 50 MG tablet Take 1 tablet (50 mg total) by mouth every 8 (eight) hours as needed. (Patient taking differently: Take 50 mg by mouth every 8 (eight) hours as needed for moderate pain. ) 30 tablet 0  . allopurinol (ZYLOPRIM) 300 MG tablet TAKE 1 TABLET BY MOUTH ONCE DAILY (Patient not taking: Reported on 02/14/2015) 30 tablet 0  . ciprofloxacin (CIPRO) 500 MG tablet Take 1 tablet (500 mg total) by mouth daily. (Patient not taking: Reported on 02/09/2015) 30 tablet 1  . doxycycline (VIBRA-TABS) 100 MG tablet Take 1 tablet (100 mg total) by mouth 2 (two) times daily. For 14 days. (Patient not taking: Reported on 02/14/2015) 28 tablet 0  . idelalisib (ZYDELIG) 100 MG tablet Take 1 tablet (100 mg total) by mouth daily. (Patient not taking: Reported on 02/14/2015) 30 tablet 0  . promethazine-dextromethorphan (PROMETHAZINE-DM) 6.25-15 MG/5ML syrup Take 5 mLs by mouth 4 (four) times daily as needed for cough. (Patient not taking: Reported on 02/14/2015) 180 mL 1    Blood pressure 121/56, pulse 69, temperature 98.4 F (36.9 C), temperature source Oral, resp. rate 16, height 5' 3"  (1.6 m), weight 96 kg (211 lb 10.3 oz), SpO2 99 %. Physical Exam: General: pleasant, WD, WN black female who is laying in bed in NAD HEENT: head is normocephalic, atraumatic.  Sclera are noninjected.  PERRL.  Ears and nose without any masses or lesions.  Mouth is pink and moist Heart: regular, rate, and rhythm.  Normal s1,s2. No obvious murmurs, gallops, or rubs noted.  Palpable radial and pedal pulses bilaterally Lungs: CTAB, no wheezes, rhonchi, or rales noted.  Respiratory effort nonlabored Abd: soft, NT, ND, +BS, no masses, hernias, or organomegaly Skin: warm and dry with no masses, lesions, or rashes.  She does have excoriations of her gluteal cleft from the most superior aspect all the way down to her  perineum.  It does have some fibrinous exudate noted, but overall relatively clean.  She has a smaller area noted at her right inguinal crease as well. Psych: A&Ox3 with an appropriate affect.    Results for orders placed or performed during the hospital encounter of 02/14/15 (from the past 48 hour(s))  Comprehensive metabolic panel     Status: Abnormal   Collection Time: 02/14/15  2:28 PM  Result Value Ref Range   Sodium 136 135 - 145 mmol/L   Potassium 4.8 3.5 - 5.1 mmol/L   Chloride 101 101 - 111 mmol/L   CO2 26 22 - 32 mmol/L   Glucose,  Bld 107 (H) 65 - 99 mg/dL   BUN 16 6 - 20 mg/dL   Creatinine, Ser 1.11 (H) 0.44 - 1.00 mg/dL   Calcium 9.0 8.9 - 10.3 mg/dL   Total Protein 6.1 (L) 6.5 - 8.1 g/dL   Albumin 3.6 3.5 - 5.0 g/dL   AST 28 15 - 41 U/L   ALT 28 14 - 54 U/L   Alkaline Phosphatase 66 38 - 126 U/L   Total Bilirubin 1.1 0.3 - 1.2 mg/dL   GFR calc non Af Amer 46 (L) >60 mL/min   GFR calc Af Amer 54 (L) >60 mL/min    Comment: (NOTE) The eGFR has been calculated using the CKD EPI equation. This calculation has not been validated in all clinical situations. eGFR's persistently <60 mL/min signify possible Chronic Kidney Disease.    Anion gap 9 5 - 15  CBC with Differential     Status: Abnormal   Collection Time: 02/14/15  2:28 PM  Result Value Ref Range   WBC 5.4 4.0 - 10.5 K/uL   RBC 2.85 (L) 3.87 - 5.11 MIL/uL   Hemoglobin 9.9 (L) 12.0 - 15.0 g/dL   HCT 29.0 (L) 36.0 - 46.0 %   MCV 101.8 (H) 78.0 - 100.0 fL   MCH 34.7 (H) 26.0 - 34.0 pg   MCHC 34.1 30.0 - 36.0 g/dL   RDW 23.4 (H) 11.5 - 15.5 %   Platelets 70 (L) 150 - 400 K/uL    Comment: REPEATED TO VERIFY SPECIMEN CHECKED FOR CLOTS CONSISTENT WITH PREVIOUS RESULT PLATELET COUNT CONFIRMED BY SMEAR    Neutrophils Relative % 2 (L) 43 - 77 %   Lymphocytes Relative 81 (H) 12 - 46 %   Monocytes Relative 15 (H) 3 - 12 %   Eosinophils Relative 1 0 - 5 %   Basophils Relative 1 0 - 1 %   Neutro Abs 0.1 (L) 1.7 -  7.7 K/uL   Lymphs Abs 4.3 (H) 0.7 - 4.0 K/uL   Monocytes Absolute 0.8 0.1 - 1.0 K/uL   Eosinophils Absolute 0.1 0.0 - 0.7 K/uL   Basophils Absolute 0.1 0.0 - 0.1 K/uL   WBC Morphology ATYPICAL LYMPHOCYTES    Smear Review PLATELET COUNT CONFIRMED BY SMEAR   I-Stat CG4 Lactic Acid, ED     Status: None   Collection Time: 02/14/15  2:39 PM  Result Value Ref Range   Lactic Acid, Venous 1.27 0.5 - 2.0 mmol/L  Basic metabolic panel     Status: Abnormal   Collection Time: 02/15/15  6:30 AM  Result Value Ref Range   Sodium 132 (L) 135 - 145 mmol/L   Potassium 3.8 3.5 - 5.1 mmol/L    Comment: DELTA CHECK NOTED REPEATED TO VERIFY    Chloride 100 (L) 101 - 111 mmol/L   CO2 24 22 - 32 mmol/L   Glucose, Bld 100 (H) 65 - 99 mg/dL   BUN 13 6 - 20 mg/dL   Creatinine, Ser 1.00 0.44 - 1.00 mg/dL   Calcium 8.0 (L) 8.9 - 10.3 mg/dL   GFR calc non Af Amer 53 (L) >60 mL/min   GFR calc Af Amer >60 >60 mL/min    Comment: (NOTE) The eGFR has been calculated using the CKD EPI equation. This calculation has not been validated in all clinical situations. eGFR's persistently <60 mL/min signify possible Chronic Kidney Disease.    Anion gap 8 5 - 15  CBC     Status: Abnormal   Collection Time:  02/15/15  6:30 AM  Result Value Ref Range   WBC 3.7 (L) 4.0 - 10.5 K/uL   RBC 2.33 (L) 3.87 - 5.11 MIL/uL   Hemoglobin 8.1 (L) 12.0 - 15.0 g/dL   HCT 23.7 (L) 36.0 - 46.0 %   MCV 101.7 (H) 78.0 - 100.0 fL   MCH 34.8 (H) 26.0 - 34.0 pg   MCHC 34.2 30.0 - 36.0 g/dL   RDW 23.6 (H) 11.5 - 15.5 %   Platelets 55 (L) 150 - 400 K/uL    Comment: CONSISTENT WITH PREVIOUS RESULT   No results found.     Assessment/Plan 1. Excoriation of gluteal cleft and right inguinal crease -this does not appear to be lesion or mass.  It looks like excoriation from skin break down of areas where skin rub together.  Dr. Excell Seltzer will evaluate this as well, but I do not think it needs a biopsy.  It will likely just require local  wound care and barrier creams to help protect from further breakdown.  Further recommendations after Dr. Lear Ng evaluation. 2. CLL -on chemo -per primary  Icelyn Navarrete E 02/15/2015, 10:05 AM Pager: [phone]

## 2015-02-15 NOTE — Clinical Social Work Note (Signed)
Clinical Social Work Assessment  Patient Details  Name: [name] MRN: [id number] Date of Birth: Feb 13, 1934  Date of referral:  02/15/15               Reason for consult:  Facility Placement                Permission sought to share information with:  Facility Sport and exercise psychologist, Family Supports Permission granted to share information::  Yes, Verbal Permission Granted  Name::     Daughters  Agency::  [facility] [facility]  Relationship::     Contact Information:     Housing/Transportation Living arrangements for the past 2 months:  Apartment Source of Information:  Patient Patient Interpreter Needed:  None Criminal Activity/Legal Involvement Pertinent to Current Situation/Hospitalization:  No - Comment as needed Significant Relationships:  Adult Children Lives with:  Self Do you feel safe going back to the place where you live?  Yes Need for family participation in patient care:  Yes (Comment) (Pt request)  Care giving concerns:  Pt lives alone currently. Has adult daughter in [place]. Level of support at this time is not adequate to meet needs. Pt will most likely require short term rehab placement.    Social Worker assessment / plan:  CSW met with Pt, introduced self and explained role. CSW discussed with Pt the option of short-term rehab at a facility and the benefits it could provide to her medical recovery. Pt discussed her desire to have her wounds heal as they are painful and stressful. CSW explained the care available at [facility]. CSW obtained verbal consent to speak with adult daughter. CSW will make contact with family. Will fax out clinicals to [facility] to facilitate SNF placement and provide choice to the Pt.  Employment status:  Retired Nurse, adult PT Recommendations:  Not assessed at this time (MD put in consult) Information / Referral to community resources:  [place]  Patient/Family's Response to care:  Pt  agreeable to SNF. CSW to contact family.  Patient/Family's Understanding of and Emotional Response to Diagnosis, Current Treatment, and Prognosis:  Pt was guarded during assessment. She was reluctant when considering SNF placement but verbalized that she would go if it was necessary. After a few questions were asked, Pt hung her head and would not make eye contact with CSW. This continued throughout the rest of the assessment; Pt reported that she felt fine. Pt exhibited flat and restricted affect, with guarded demeanor.   Emotional Assessment Appearance:  Appears stated age, Disheveled Attitude/Demeanor/Rapport:  Guarded Building surveyor) Affect (typically observed):  Flat, Guarded Orientation:  Oriented to Place, Oriented to Self, Oriented to  Time, Oriented to Situation Alcohol / Substance use:  Not Applicable Psych involvement (Current and /or in the community):  No (Comment)  Discharge Needs  Concerns to be addressed:  No discharge needs identified Readmission within the last 30 days:  No Current discharge risk:  Lives alone Barriers to Discharge:  No Barriers Identified   Bo Mcclintock, LCSW 02/15/2015, 4:25 PM

## 2015-02-16 ENCOUNTER — Other Ambulatory Visit: Payer: Self-pay | Admitting: Nurse Practitioner

## 2015-02-16 ENCOUNTER — Telehealth: Payer: Self-pay | Admitting: Nurse Practitioner

## 2015-02-16 DIAGNOSIS — C9112 Chronic lymphocytic leukemia of B-cell type in relapse: Secondary | ICD-10-CM

## 2015-02-16 DIAGNOSIS — L98491 Non-pressure chronic ulcer of skin of other sites limited to breakdown of skin: Secondary | ICD-10-CM

## 2015-02-16 DIAGNOSIS — C9192 Lymphoid leukemia, unspecified, in relapse: Secondary | ICD-10-CM

## 2015-02-16 DIAGNOSIS — D709 Neutropenia, unspecified: Secondary | ICD-10-CM

## 2015-02-16 MED ORDER — POLYETHYLENE GLYCOL 3350 17 G PO PACK: 17.0000 g | PACK | Freq: Every day | ORAL | Status: DC

## 2015-02-16 MED ORDER — OXYCODONE-ACETAMINOPHEN 7.5-325 MG PO TABS: 1.0000 | ORAL_TABLET | Freq: Four times a day (QID) | ORAL | Status: DC | PRN

## 2015-02-16 MED ORDER — CIPROFLOXACIN HCL 500 MG PO TABS: 500.0000 mg | ORAL_TABLET | Freq: Two times a day (BID) | ORAL | Status: DC

## 2015-02-16 MED ORDER — DOXYCYCLINE HYCLATE 100 MG PO TABS: 100.0000 mg | ORAL_TABLET | Freq: Two times a day (BID) | ORAL | Status: DC

## 2015-02-16 MED ORDER — CLOTRIMAZOLE 1 % EX CREA: TOPICAL_CREAM | Freq: Two times a day (BID) | CUTANEOUS | Status: AC

## 2015-02-16 NOTE — Progress Notes (Signed)
PATIENT DETAILS Name: [name] Age: 81 y.o. Sex: female Date of Birth: 06/15/1934 Admit Date: 02/14/2015 Admitting Physician Evalee Mutton Kristeen Mans, MD [id number] Asa Lente, MD  Subjective: Continues to have pain in her gluteal area.  Assessment/Plan: Principal Problem:   Skin ulcer of multiple sites of buttock: Etiology unknown-suspicion that this is related to skin breakdown from excoriation and poor wound healing given immunocompromised status. Spoke with oncology-Dr. Benay Spice who did not think that this was related to underlying CLL or anti neoplastic meds. Seen by general surgery-recommendations are for wound care. Per surgery no role for bx at this time. .  Active Problems:   Breast cancer left T2N0 S/P mastectomy and SLN Bx 09/2010:Continue with tamoxifen    Pancytopenia: Secondary to underlying CLL-chronic issue and not very different from usual baseline.Started on Filgrastim-spoke with Dr Sherrill-continue while inpatient.. Follow CBC.    CLL (chronic lymphocytic leukemia): Patient was on on rituximab and idelalisib this admission-on hold due to progressive gluteal ulceration and persistent neutropenia. Oncology  following   Benign essential HTN: Controlled-continue amlodipine, atenolol and HCTZ   Vertigo: Continue with as needed meclizine. No dizziness at this time  Disposition: Remain inpatient-home with HHRN vs SNF in am  Antimicrobial agents  See below  Anti-infectives    Start     Dose/Rate Route Frequency Ordered Stop   02/15/15 1800  aztreonam (AZACTAM) 1 g in dextrose 5 % 50 mL IVPB     1 g 100 mL/hr over 30 Minutes Intravenous Every 8 hours 02/15/15 1651     02/14/15 1800  acyclovir (ZOVIRAX) tablet 400 mg     400 mg Oral Daily 02/14/15 1751     02/14/15 1600  vancomycin (VANCOCIN) 1,500 mg in sodium chloride 0.9 % 500 mL IVPB     1,500 mg 250 mL/hr over 120 Minutes Intravenous Every 24 hours 02/14/15 1528        DVT Prophylaxis:  SCD's-has thrombocytopenia  Code Status: Full code   Family Communication None at bedside  Procedures: None  CONSULTS:  hematology/oncology and general surgery  Time spent 25 minutes-Greater than 50% of this time was spent in counseling, explanation of diagnosis, planning of further management, and coordination of care.  MEDICATIONS: Scheduled Meds: . acyclovir  400 mg Oral Daily  . amLODipine  10 mg Oral Daily  . atenolol  50 mg Oral Daily  . aztreonam  1 g Intravenous Q8H  . clotrimazole   Topical BID  . donepezil  5 mg Oral QHS  . hydrochlorothiazide  12.5 mg Oral Daily  . pantoprazole  40 mg Oral Daily  . polyethylene glycol  17 g Oral Daily  . tamoxifen  20 mg Oral Daily  . Tbo-Filgrastim  480 mcg Subcutaneous QPC supper  . vancomycin  1,500 mg Intravenous Q24H   Continuous Infusions:  PRN Meds:.acetaminophen **OR** acetaminophen, albuterol, barrier cream, bisacodyl, calcium carbonate, guaiFENesin-dextromethorphan, meclizine, morphine injection, ondansetron **OR** ondansetron (ZOFRAN) IV, oxyCODONE, oxyCODONE-acetaminophen, sodium chloride    PHYSICAL EXAM: Vital signs in last 24 hours: Filed Vitals:   02/15/15 0607 02/15/15 1543 02/15/15 2102 02/16/15 0551  BP: 121/56 164/61 94/71 142/76  Pulse: 69 68 64 64  Temp: 98.4 F (36.9 C) 98.7 F (37.1 C) 100.2 F (37.9 C) 99.4 F (37.4 C)  TempSrc: Oral Oral Oral Oral  Resp: 16 18 18 18   Height:      Weight:      SpO2:  99% 100% 100% 100%    Weight change:  Filed Weights   02/14/15 1814  Weight: 96 kg (211 lb 10.3 oz)   Body mass index is 37.5 kg/(m^2).   Gen Exam: Awake and alert with clear speech. Neck: Supple, No JVD.   Chest: B/L Clear.   CVS: S1 S2 Regular, no murmurs.  Abdomen: soft, BS +, non tender, non distended.  Extremities: no edema, lower extremities warm to touch. Neurologic: Non Focal.   Skin: No Rash.    Intake/Output from previous day:  Intake/Output Summary (Last 24 hours) at  02/16/15 1257 Last data filed at 02/16/15 0848  Gross per 24 hour  Intake    480 ml  Output      0 ml  Net    480 ml     LAB RESULTS: CBC  Recent Labs Lab 02/14/15 1428 02/15/15 0630  WBC 5.4 3.7*  HGB 9.9* 8.1*  HCT 29.0* 23.7*  PLT 70* 55*  MCV 101.8* 101.7*  MCH 34.7* 34.8*  MCHC 34.1 34.2  RDW 23.4* 23.6*  LYMPHSABS 4.3*  --   MONOABS 0.8  --   EOSABS 0.1  --   BASOSABS 0.1  --     Chemistries   Recent Labs Lab 02/14/15 1428 02/15/15 0630  NA 136 132*  K 4.8 3.8  CL 101 100*  CO2 26 24  GLUCOSE 107* 100*  BUN 16 13  CREATININE 1.11* 1.00  CALCIUM 9.0 8.0*    CBG: No results for input(s): GLUCAP in the last 168 hours.  GFR Estimated Creatinine Clearance: 51.1 mL/min (by C-G formula based on Cr of 1).  Coagulation profile No results for input(s): INR, PROTIME in the last 168 hours.  Cardiac Enzymes No results for input(s): CKMB, TROPONINI, MYOGLOBIN in the last 168 hours.  Invalid input(s): CK  Invalid input(s): POCBNP No results for input(s): DDIMER in the last 72 hours. No results for input(s): HGBA1C in the last 72 hours. No results for input(s): CHOL, HDL, LDLCALC, TRIG, CHOLHDL, LDLDIRECT in the last 72 hours. No results for input(s): TSH, T4TOTAL, T3FREE, THYROIDAB in the last 72 hours.  Invalid input(s): FREET3 No results for input(s): VITAMINB12, FOLATE, FERRITIN, TIBC, IRON, RETICCTPCT in the last 72 hours. No results for input(s): LIPASE, AMYLASE in the last 72 hours.  Urine Studies No results for input(s): UHGB, CRYS in the last 72 hours.  Invalid input(s): UACOL, UAPR, USPG, UPH, UTP, UGL, UKET, UBIL, UNIT, UROB, ULEU, UEPI, UWBC, URBC, UBAC, CAST, UCOM, BILUA  MICROBIOLOGY: Recent Results (from the past 240 hour(s))  TECHNOLOGIST REVIEW     Status: None   Collection Time: 02/09/15 10:24 AM  Result Value Ref Range Status   Technologist Review Variant lymphs present  Final    RADIOLOGY STUDIES/RESULTS: No results  found.  Oren Binet, MD  [facility] Pager:[phone]  If 7PM-7AM, please contact night-coverage [email or web address] Password [email or web address] 02/16/2015, 12:57 PM   LOS: 2 days

## 2015-02-16 NOTE — Evaluation (Signed)
Physical Therapy Evaluation Patient Details Name: [name] MRN: [id number] DOB: 12-21-1933 Today's Date: 02/16/2015   History of Present Illness  [name] is a 81 y.o. female who presents today with painful  ulcers in her gluteal cleft;  Past Medical History of CLL on chemotherapy, hypertension, breast cancer status post left mastectomy on tamoxifen  Clinical Impression  Pt seen for PT eval and her mobility is nearing her baseline; pt doesn't amb with RW in her apt and has multiple steps to enter, will  Follow and address these issues;  Pt is concerned about D/C home and is thinking about SNF d/t difficulty with wound care/dsg changes; recommend HHPT vs SNF (would have VERY short term rehab needs from PT standpoint)    Follow Up Recommendations Home health PT;SNF (pt wants SNF d/t issues with dressing changes; limited skilled needs from PT standpoint)    Equipment Recommendations    none   Recommendations for Other Services       Precautions / Restrictions Precautions Precautions: Fall Restrictions Weight Bearing Restrictions: No      Mobility  Bed Mobility               General bed mobility comments: NT, pt in recliner  Transfers Overall transfer level: Modified independent Equipment used: Rolling walker (2 wheeled)                Ambulation/Gait Ambulation/Gait assistance: Supervision;Min guard Ambulation Distance (Feet): 80 Feet Assistive device: Rolling walker (2 wheeled) Gait Pattern/deviations: Step-through pattern;Trunk flexed     General Gait Details: occasional cues for obstacle negotiation and safety with turns  Financial trader Rank[employer] (Stroke Patients Only)       Balance                                             Pertinent Vitals/Pain Pain Assessment:  (had pain meds before PT arrived)    Home Living Family/patient expects to be discharged to:: Private  residence Living Arrangements: Alone   Type of Home: [place] Access: Stairs to enter   [employer] of Steps: [place]: One level Home Equipment: [place] - 2 wheels;Bedside commode      Prior Function Level of Independence: Independent with assistive device(s);Independent         Comments: uses RW when going out, longer distances     Hand Dominance        Extremity/Trunk Assessment   Upper Extremity Assessment: Overall WFL for tasks assessed           Lower Extremity Assessment: Overall WFL for tasks assessed         Communication   Communication: No difficulties  Cognition Arousal/Alertness: Awake/alert Behavior During Therapy: WFL for tasks assessed/performed Overall Cognitive Status: Within Functional Limits for tasks assessed                      General Comments General comments (skin integrity, edema, etc.): pt reports feeling that she is more tired, she states she may just be sleepy d/t pain pill    Exercises        Assessment/Plan    PT Assessment Patient needs continued PT services  PT Diagnosis Difficulty walking   PT Problem List Decreased activity tolerance;Decreased mobility  PT Treatment Interventions DME instruction;Gait training;Stair training;Therapeutic exercise   PT Goals (Current goals can be found in the Care Plan section) Acute Rehab PT Goals Patient Stated Goal: wounds healed and get stronger PT Goal Formulation: With patient Time For Goal Achievement: 02/23/15 Potential to Achieve Goals: Good    Frequency Min 2X/week   Barriers to discharge Inaccessible home environment;Decreased caregiver support pt has 5+stairs to enter her home, lives alone    Co-evaluation               End of Session Equipment Utilized During Treatment: Gait belt Activity Tolerance: Patient tolerated treatment well Patient left: in chair;with call bell/phone within reach Nurse Communication: Mobility status          Time: 6203-5597 PT Time Calculation (min) (ACUTE ONLY): 22 min   Charges:   PT Evaluation $Initial PT Evaluation Tier I: 1 Procedure     PT G CodesKenyon Ana Feb 21, 2015, 11:18 AM

## 2015-02-16 NOTE — Telephone Encounter (Signed)
Patient is in an inpatient at this time,appontments were given to her friend and they will be on her hospital d/c forms

## 2015-02-16 NOTE — Progress Notes (Signed)
CSW met with Pt and Pt daughter who was at bedside. Pt is requesting SNF placement today. CSW provided list of facilities, indicated which offered beds. Family and Pt expressed that [facility] would be their choice for facility.   CSW initiated authorization process with Pt insurance. CSW spoke with Geni Bers at [place] who reported that insurance authorization would most likely not come through by the end of this day and requests for authorization cannot be processed over the weekend.  CSW contact [facility] regarding possible placement with LOG option.   Will continue to facilitate placement.  Peri Maris, [place] 02/16/2015 5:17 PM [phone]

## 2015-02-16 NOTE — Discharge Summary (Addendum)
PATIENT DETAILS Name: [name] Age: 81 y.o. Sex: female Date of Birth: 1934-08-07 MRN: [id number]. Admitting Physician: Jonetta Osgood, MD SWN:[id number] Asa Lente, MD  Admit Date: 02/14/2015 Discharge date: 02/17/2015  Recommendations for Outpatient Follow-up:  1. See wound care instructions below 2. Please ensure follow up with Oncology 3. Will require close monitoring of CBC (pancytopenia)-has blood work and follow up appt scheduled at [facility] on 02/23/15  4. Complete 7 more days of Cipro/Doxy from 5/20  PRIMARY DISCHARGE DIAGNOSIS:  Principal Problem:   Skin ulcer of multiple sites of buttock Active Problems:   Breast cancer left T2N0 S/P mastectomy and SLN Bx 09/2010   Benign essential HTN   Vertigo   Memory loss   CLL (chronic lymphocytic leukemia)   Cellulitis   Neutropenia      PAST MEDICAL HISTORY: Past Medical History  Diagnosis Date  . 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 Stroke 05/2009    05/2009  Right upper extrem/leg weakness; nuchal pain;  . Benign essential HTN   . Macrocytic anemia   . Vertigo     ?TIA - MRI brain 5/13  No new CVA  . Pleurisy     01/16/12 pleuritic right chest pain  V/Q scan [place] [facility] low probability PE  . Breast cancer left T2N0 S/P mastectomy and SLN Bx 09/2010 2009  . CLL (chronic lymphoid leukemia) in relapse   . Arthritis   . Hyperlipidemia   . Thrombocytopenia, unspecified 06/15/2013    DISCHARGE MEDICATIONS: Current Discharge Medication List    START taking these medications   Details  clotrimazole (LOTRIMIN) 1 % cream Apply topically 2 (two) times daily. To wound Qty: 30 g, Refills: 0    oxyCODONE-acetaminophen (PERCOCET) 7.5-325 MG per tablet Take 1-2 tablets by mouth every 6 (six) hours as needed for moderate pain. Qty: 30 tablet, Refills: 0    polyethylene glycol (MIRALAX /  GLYCOLAX) packet Take 17 g by mouth daily. Qty: 14 each, Refills: 0      CONTINUE these medications which have CHANGED   Details  ciprofloxacin (CIPRO) 500 MG tablet Take 1 tablet (500 mg total) by mouth 2 (two) times daily. 7 more days from 02/16/15    doxycycline (VIBRA-TABS) 100 MG tablet Take 1 tablet (100 mg total) by mouth 2 (two) times daily. For 7 more days from 02/17/15 Qty: 28 tablet, Refills: 0      CONTINUE these medications which have NOT CHANGED   Details  acyclovir (ZOVIRAX) 400 MG tablet Take 1 tablet (400 mg total) by mouth daily. Qty: 30 tablet, Refills: 4    amLODipine (NORVASC) 10 MG tablet Take 1 tablet (10 mg total) by mouth daily. Qty: 30 tablet, Refills: 0    atenolol (TENORMIN) 50 MG tablet Take 1 tablet (50 mg total) by mouth daily. Qty: 90 tablet, Refills: 1    calcium carbonate (TUMS - DOSED IN MG ELEMENTAL CALCIUM) 500 MG chewable tablet Chew 1 tablet by mouth as needed for indigestion or heartburn. For stomach    donepezil (ARICEPT) 5 MG tablet Take 1 tablet (5 mg total) by mouth at bedtime. Qty: 90 tablet, Refills: 3    hydrochlorothiazide (MICROZIDE) 12.5 MG capsule Take 1 capsule (12.5 mg total) by mouth daily. Qty: 90 capsule, Refills: 3    magnesium hydroxide (MILK OF MAGNESIA) 400 MG/5ML suspension Take 15 mLs by mouth daily as  needed for mild constipation.    meclizine (ANTIVERT) 12.5 MG tablet Take 1 tablet (12.5 mg total) by mouth 3 (three) times daily as needed for dizziness. Qty: 90 tablet, Refills: 1    pantoprazole (PROTONIX) 40 MG tablet TAKE 1 TABLET (40 MG TOTAL) BY MOUTH DAILY. Qty: 90 tablet, Refills: 3    potassium chloride SA (KLOR-CON M20) 20 MEQ tablet Take 1 tablet (20 mEq total) by mouth daily. Qty: 90 tablet, Refills: 3    prochlorperazine (COMPAZINE) 10 MG tablet Take 1 tablet (10 mg total) by mouth every 6 (six) hours as needed. For nausea Qty: 30 tablet, Refills: 4    tamoxifen (NOLVADEX) 20 MG tablet TAKE 1 TABLET  (20 MG TOTAL) BY MOUTH DAILY. Qty: 30 tablet, Refills: 4      STOP taking these medications     acetaminophen (TYLENOL) 500 MG tablet      lidocaine-prilocaine (EMLA) cream      traMADol (ULTRAM) 50 MG tablet      allopurinol (ZYLOPRIM) 300 MG tablet      idelalisib (ZYDELIG) 100 MG tablet      promethazine-dextromethorphan (PROMETHAZINE-DM) 6.25-15 MG/5ML syrup         ALLERGIES:   Allergies  Allergen Reactions  . Obinutuzumab Shortness Of Breath and Rash  . Penicillins Anaphylaxis, Shortness Of Breath, Itching, Swelling and Rash    Swelling all over body and throat   . Latex Itching    BRIEF HPI:  See H&P, Labs, Consult and Test reports for all details in brief, patient is a 81 y.o. female with a Past Medical History of CLL on chemotherapy, hypertension, breast cancer status post left mastectomy on tamoxifen who presented with Painful gluteal ulcers   CONSULTATIONS:   hematology/oncology and general surgery  PERTINENT RADIOLOGIC STUDIES: No results found.   PERTINENT LAB RESULTS: CBC:  Recent Labs  02/15/15 0630 02/17/15 0510  WBC 3.7* 5.6  HGB 8.1* 8.5*  HCT 23.7* 25.8*  PLT 55* 57*   CMET CMP     Component Value Date/Time   NA 134* 02/17/2015 0510   NA 138 02/09/2015 1024   NA 142 01/01/2011   K 4.1 02/17/2015 0510   K 4.2 02/09/2015 1024   CL 102 02/17/2015 0510   CL 107 02/25/2013 1028   CO2 25 02/17/2015 0510   CO2 24 02/09/2015 1024   GLUCOSE 113* 02/17/2015 0510   GLUCOSE 143* 02/09/2015 1024   GLUCOSE 147* 02/25/2013 1028   BUN 11 02/17/2015 0510   BUN 19.1 02/09/2015 1024   BUN 16 01/01/2011   CREATININE 0.90 02/17/2015 0510   CREATININE 1.4* 02/09/2015 1024   CREATININE 0.9 01/01/2011   CALCIUM 8.6* 02/17/2015 0510   CALCIUM 8.6 02/09/2015 1024   PROT 6.1* 02/14/2015 1428   PROT 5.7* 02/09/2015 1024   ALBUMIN 3.6 02/14/2015 1428   ALBUMIN 3.3* 02/09/2015 1024   AST 28 02/14/2015 1428   AST 24 02/09/2015 1024   ALT 28  02/14/2015 1428   ALT 25 02/09/2015 1024   ALKPHOS 66 02/14/2015 1428   ALKPHOS 67 02/09/2015 1024   BILITOT 1.1 02/14/2015 1428   BILITOT 0.44 02/09/2015 1024   GFRNONAA 60* 02/17/2015 0510   GFRAA >60 02/17/2015 0510    GFR Estimated Creatinine Clearance: 56.8 mL/min (by C-G formula based on Cr of 0.9). No results for input(s): LIPASE, AMYLASE in the last 72 hours. No results for input(s): CKTOTAL, CKMB, CKMBINDEX, TROPONINI in the last 72 hours. Invalid input(s): POCBNP No results for  input(s): DDIMER in the last 72 hours. No results for input(s): HGBA1C in the last 72 hours. No results for input(s): CHOL, HDL, LDLCALC, TRIG, CHOLHDL, LDLDIRECT in the last 72 hours. No results for input(s): TSH, T4TOTAL, T3FREE, THYROIDAB in the last 72 hours.  Invalid input(s): FREET3 No results for input(s): VITAMINB12, FOLATE, FERRITIN, TIBC, IRON, RETICCTPCT in the last 72 hours. Coags: No results for input(s): INR in the last 72 hours.  Invalid input(s): PT Microbiology: Recent Results (from the past 240 hour(s))  TECHNOLOGIST REVIEW     Status: None   Collection Time: 02/09/15 10:24 AM  Result Value Ref Range Status   Technologist Review Variant lymphs present  Final     BRIEF HOSPITAL COURSE:  Skin ulcer of multiple sites of buttock: Etiology unknown-suspicion that this is related to skin breakdown from excoriation and poor wound healing given immunocompromised status. Spoke with oncology-Dr. Benay Spice who did not think that this was related to underlying CLL or anti neoplastic meds. Empirically started on IV Vanco/Aztreonam-will transition to Doxy/Cipro on discharge. Seen by general surgery-recommendations are for wound care. Per surgery no role for bx at this time. See below for wound care instructions.  Active Problems:  Breast cancer left T2N0 S/P mastectomy and SLN Bx 09/2010:Continue with tamoxifen   Pancytopenia: Secondary to underlying CLL-chronic issue and not very  different from usual baseline.Started on Filgrastim, while inpatient, once discharged, Oncology will arrange for outpatient Neupogen injection. Follow CBC closely while outpatient.   CLL (chronic lymphocytic leukemia): Patient was on on rituximab and idelalisib this admission-on hold due to progressive gluteal ulceration and persistent neutropenia. No plans to restart in the immediate future per Oncology-decision to restart will depend on CBC and wounds. Will require close outpatient oncology follow up  Benign essential HTN: Controlled-continue amlodipine, atenolol and HCTZ  Vertigo: Continue with as needed meclizine. No dizziness at this time   TODAY-DAY OF DISCHARGE:  Subjective:   Alayshia Gellatly today has no headache,no chest abdominal pain,no new weakness tingling or numbness  Objective:   Blood pressure 122/40, pulse 65, temperature 98.9 F (37.2 C), temperature source Oral, resp. rate 18, height 5\' 3"  (1.6 m), weight 96 kg (211 lb 10.3 oz), SpO2 100 %.  Intake/Output Summary (Last 24 hours) at 02/17/15 0939 Last data filed at 02/16/15 1839  Gross per 24 hour  Intake    480 ml  Output      0 ml  Net    480 ml   Filed Weights   02/14/15 1814  Weight: 96 kg (211 lb 10.3 oz)    Exam Awake Alert, Oriented *3, No new F.N deficits, Normal affect [place].AT,PERRAL Supple Neck,No JVD, No cervical lymphadenopathy appriciated.  Symmetrical Chest wall movement, Good air movement bilaterally, CTAB RRR,No Gallops,Rubs or new Murmurs, No Parasternal Heave +ve B.Sounds, Abd Soft, Non tender, No organomegaly appriciated, No rebound -guarding or rigidity. No Cyanosis, Clubbing or edema, No new Rash or bruise  DISCHARGE CONDITION: Stable  DISPOSITION: SNF  DISCHARGE INSTRUCTIONS:    Activity:  As tolerated with Full fall precautions use walker/cane & assistance as needed  Diet recommendation: Heart Healthy diet  Discharge Instructions    Call MD for:  redness, tenderness, or signs  of infection (pain, swelling, redness, odor or green/yellow discharge around incision site)    Complete by:  As directed      Call MD for:  severe uncontrolled pain    Complete by:  As directed      Diet - low  sodium heart healthy    Complete by:  As directed      Discharge wound care:    Complete by:  As directed   Dressing procedure/placement/frequency: Cleanse bilateral buttocks with soap and water and pat gently dry. Apply InterDry moisture wicking fabric to gluteal fold and inguinal/groin dednuded areas.  Measure and cut length of InterDry Ag+ to fit in skin folds that have skin breakdown Tuck InterDry Ag+ fabric into skin folds in a single layer, allow for 2 inches of overhang from skin edges to allow for wicking to occur May remove to bathe; dry area thoroughly and then tuck into affected areas again Do not apply any creams or ointments when using InterDry Ag+ DO NOT THROW AWAY FOR 5 DAYS unless soiled with stool DO NOT [facility] product, this will inactivate the silver in the material  New sheet of Interdry Ag+ should be applied after 5 days of use if patient continues to have skin breakdown     Increase activity slowly    Complete by:  As directed            Follow-up Information    Follow up with Gwendolyn Grant, MD. Schedule an appointment as soon as possible for a visit in 2 weeks.   Specialty:  Internal Medicine   Contact information:   [address] [place] [address] [phone]       Follow up with [facility] On 02/19/2015.   Specialty:  Oncology   Why:  appointment at 2:30 pm for injection   Contact information:   [place] [id number] [place] [phone]      Follow up with [facility] On 02/20/2015.   Specialty:  Oncology   Why:  appt at 3:45pm for injection   Contact information:   [address] [id number] [place] [phone]      Follow up with [facility] On 02/21/2015.   Specialty:  Oncology   Why:  appt at 2:45 pm for injection   Contact information:   [address] [id number] [place] [phone]      Follow up with [facility].   Specialty:  Oncology   Why:  appointment at 9:15 am for lab work   Contact information:   [place] [id number] [place] [phone]      Follow up with [facility].   Specialty:  Oncology   Why:  APPOINTMET AT 9:45 AM [place] information:   [place] [id number] [place] [address] [phone]     Total Time spent on discharge equals 45 minutes.  SignedOren Binet 02/17/2015 9:39 AM

## 2015-02-16 NOTE — Progress Notes (Signed)
IP PROGRESS NOTE  Subjective:   She appears unchanged. She does not feel ready to get home.  Objective: Vital signs in last 24 hours: Blood pressure 142/76, pulse 64, temperature 99.4 F (37.4 C), temperature source Oral, resp. rate 18, height 5\' 3"  (1.6 m), weight 211 lb 10.3 oz (96 kg), SpO2 100 %.  Intake/Output from previous day: 05/19 0701 - 05/20 0700 In: 480 [P.O.:480] Out: -   Physical Exam:   No rash at the left upper anterior chest   Portacath/PICC-without erythema  Lab Results:  Recent Labs  02/14/15 1428 02/15/15 0630  WBC 5.4 3.7*  HGB 9.9* 8.1*  HCT 29.0* 23.7*  PLT 70* 55*    BMET  Recent Labs  02/14/15 1428 02/15/15 0630  NA 136 132*  K 4.8 3.8  CL 101 100*  CO2 26 24  GLUCOSE 107* 100*  BUN 16 13  CREATININE 1.11* 1.00  CALCIUM 9.0 8.0*    Studies/Results: No results found.  Medications: I have reviewed the patient's current medications.  Assessment/Plan:  1. Chronic lymphocytic leukemia-started systemic therapy with rituximab and idelalisib 12/01/2014, idelalisib placed on hold 03/12/2015  G-CSF started 02/15/2015   2. Left breast cancer 10/09/2010 (mpT2,mpN0) confirmed, maintained on tamoxifen  3. Pancytopenia secondary to CLL involving the bone marrow  4. Ulceration at the gluteal fold and right inguinal region-likely pressure ulcers, though she is at high risk for infection  She has severe pancytopenia and a progressive ulcer at the gluteal fold. She has pain related to the ulcer. We started G-CSF yesterday in an attempt to raise the neutrophil count hoping this will help with healing.  Recommendations: 1. Continue wound care 2. Continue G-CSF 3. Narcotic analgesics for pain 4. Daily CBC, stop G-CSF for neutrophil count of greater than 5000  Please call hematology as needed over the weekend. I will check on her 02/19/2015 if she remains in the hospital. We will schedule outpatient follow-up.   LOS: 2 days    [name], Dominica Severin  02/16/2015, 9:31 AM

## 2015-02-17 DIAGNOSIS — R42 Dizziness and giddiness: Secondary | ICD-10-CM

## 2015-02-17 LAB — CBC WITH DIFFERENTIAL/PLATELET
BASOS PCT: 1 % (ref 0–1)
Basophils Absolute: 0.1 10*3/uL (ref 0.0–0.1)
EOS ABS: 0.1 10*3/uL (ref 0.0–0.7)
Eosinophils Relative: 2 % (ref 0–5)
HCT: 25.8 % — ABNORMAL LOW (ref 36.0–46.0)
HEMOGLOBIN: 8.5 g/dL — AB (ref 12.0–15.0)
LYMPHS PCT: 77 % — AB (ref 12–46)
Lymphs Abs: 4.2 10*3/uL — ABNORMAL HIGH (ref 0.7–4.0)
MCH: 33.7 pg (ref 26.0–34.0)
MCHC: 32.9 g/dL (ref 30.0–36.0)
MCV: 102.4 fL — ABNORMAL HIGH (ref 78.0–100.0)
MONO ABS: 1.1 10*3/uL — AB (ref 0.1–1.0)
MONOS PCT: 19 % — AB (ref 3–12)
NEUTROS ABS: 0.1 10*3/uL — AB (ref 1.7–7.7)
NEUTROS PCT: 1 % — AB (ref 43–77)
PLATELETS: 57 10*3/uL — AB (ref 150–400)
RBC: 2.52 MIL/uL — ABNORMAL LOW (ref 3.87–5.11)
RDW: 23.5 % — AB (ref 11.5–15.5)
WBC: 5.6 10*3/uL (ref 4.0–10.5)

## 2015-02-17 LAB — BASIC METABOLIC PANEL
ANION GAP: 7 (ref 5–15)
BUN: 11 mg/dL (ref 6–20)
CHLORIDE: 102 mmol/L (ref 101–111)
CO2: 25 mmol/L (ref 22–32)
Calcium: 8.6 mg/dL — ABNORMAL LOW (ref 8.9–10.3)
Creatinine, Ser: 0.9 mg/dL (ref 0.44–1.00)
GFR, EST NON AFRICAN AMERICAN: 60 mL/min — AB (ref >60)
Glucose, Bld: 113 mg/dL — ABNORMAL HIGH (ref 65–99)
Potassium: 4.1 mmol/L (ref 3.5–5.1)
Sodium: 134 mmol/L — ABNORMAL LOW (ref 135–145)

## 2015-02-17 MED ORDER — HEPARIN SOD (PORK) LOCK FLUSH 100 UNIT/ML IV SOLN
500.0000 [IU] | INTRAVENOUS | Status: AC | PRN
Start: 2015-02-17 — End: 2015-02-17
  Administered 2015-02-17: 500 [IU]

## 2015-02-17 NOTE — Care Management (Signed)
Medicare Important Message given? YES  Date Medicare IM given: 02/17/15 Medicare IM given by: Venita Sheffield RN

## 2015-02-17 NOTE — Clinical Social Work Placement (Signed)
   CLINICAL SOCIAL WORK PLACEMENT  NOTE  Date:  02/17/2015  Patient Details  Name: [name] MRN: [id number] Date of Birth: 1934/08/26  Clinical Social Work is seeking post-discharge placement for this patient at the [place] level of care (*CSW will initial, date and re-position this form in  chart as items are completed):  Yes   Patient/family provided with [place] Work Department's list of facilities offering this level of care within the geographic area requested by the patient (or if unable, by the patient's family).  Yes   Patient/family informed of their freedom to choose among providers that offer the needed level of care, that participate in Medicare, Medicaid or managed care program needed by the patient, have an available bed and are willing to accept the patient.  Yes   Patient/family informed of [facility]'s ownership interest in [facility] and [facility], as well as of the fact that they are under no obligation to receive care at these facilities.  PASRR submitted to EDS on       PASRR number received on       Existing PASRR number confirmed on 02/16/15     FL2 transmitted to all facilities in geographic area requested by pt/family on 02/16/15     FL2 transmitted to all facilities within larger geographic area on 02/16/15     Patient informed that his/her managed care company has contracts with or will negotiate with certain facilities, including the following:        Yes   Patient/family informed of bed offers received.  Patient chooses bed at  [facility]      Physician recommends and patient chooses bed at      Patient to be transferred to  [facility]  on  02/17/2015.  Patient to be transferred to facility by daughters car     Patient family notified on  02/17/2015 of transfer.  Name of family member notified:   Link Snuffer     PHYSICIAN       Additional Comment:     _______________________________________________ Edson Snowball, LCSW 02/17/2015, 11:08 AM

## 2015-02-17 NOTE — Progress Notes (Signed)
CSW assisted with pt discharge plans to [facility]. Pt expressed concern over directions. CSW provided supportive counseling and helped to get directions for pt and pt daughter. Patient daughter plans to pick up patient around 2pm, and transport pt to [facility]. Pt daughter to assist with paperwork at 3pm with admissions. RN aware and provided number for report [phone]. No further Clinical Social Work needs, signing off.   [name] , [place] weekend covering [place]

## 2015-02-19 ENCOUNTER — Ambulatory Visit (HOSPITAL_BASED_OUTPATIENT_CLINIC_OR_DEPARTMENT_OTHER): Payer: Medicare Other

## 2015-02-19 VITALS — BP 131/37 | HR 63 | Temp 98.2°F

## 2015-02-19 DIAGNOSIS — C9192 Lymphoid leukemia, unspecified, in relapse: Secondary | ICD-10-CM

## 2015-02-19 DIAGNOSIS — D709 Neutropenia, unspecified: Secondary | ICD-10-CM | POA: Diagnosis not present

## 2015-02-19 DIAGNOSIS — C9112 Chronic lymphocytic leukemia of B-cell type in relapse: Secondary | ICD-10-CM

## 2015-02-19 MED ORDER — FILGRASTIM 480 MCG/0.8ML IJ SOSY: 480.0000 ug | PREFILLED_SYRINGE | Freq: Every day | INTRAMUSCULAR | Status: DC

## 2015-02-19 MED ORDER — TBO-FILGRASTIM 480 MCG/0.8ML ~~LOC~~ SOSY
480.0000 ug | PREFILLED_SYRINGE | Freq: Once | SUBCUTANEOUS | Status: AC
  Administered 2015-02-19: 480 ug via SUBCUTANEOUS
  Filled 2015-02-19: qty 0.8

## 2015-02-19 NOTE — Patient Instructions (Signed)

## 2015-02-20 ENCOUNTER — Ambulatory Visit (HOSPITAL_BASED_OUTPATIENT_CLINIC_OR_DEPARTMENT_OTHER): Payer: Medicare Other

## 2015-02-20 VITALS — BP 111/40 | HR 57 | Temp 98.7°F

## 2015-02-20 DIAGNOSIS — D61818 Other pancytopenia: Secondary | ICD-10-CM

## 2015-02-20 DIAGNOSIS — D709 Neutropenia, unspecified: Secondary | ICD-10-CM

## 2015-02-20 DIAGNOSIS — C9192 Lymphoid leukemia, unspecified, in relapse: Secondary | ICD-10-CM

## 2015-02-20 DIAGNOSIS — C9112 Chronic lymphocytic leukemia of B-cell type in relapse: Secondary | ICD-10-CM

## 2015-02-20 MED ORDER — FILGRASTIM 480 MCG/0.8ML IJ SOSY
480.0000 ug | PREFILLED_SYRINGE | Freq: Every day | INTRAMUSCULAR | Status: DC
  Filled 2015-02-20: qty 0.8

## 2015-02-20 MED ORDER — TBO-FILGRASTIM 480 MCG/0.8ML ~~LOC~~ SOSY
480.0000 ug | PREFILLED_SYRINGE | Freq: Once | SUBCUTANEOUS | Status: AC
  Administered 2015-02-20: 480 ug via SUBCUTANEOUS
  Filled 2015-02-20: qty 0.8

## 2015-02-21 ENCOUNTER — Ambulatory Visit (HOSPITAL_BASED_OUTPATIENT_CLINIC_OR_DEPARTMENT_OTHER): Payer: Medicare Other

## 2015-02-21 VITALS — BP 108/31 | HR 53 | Temp 98.9°F

## 2015-02-21 DIAGNOSIS — C9112 Chronic lymphocytic leukemia of B-cell type in relapse: Secondary | ICD-10-CM

## 2015-02-21 DIAGNOSIS — D709 Neutropenia, unspecified: Secondary | ICD-10-CM

## 2015-02-21 DIAGNOSIS — C9192 Lymphoid leukemia, unspecified, in relapse: Secondary | ICD-10-CM

## 2015-02-21 MED ORDER — FILGRASTIM 480 MCG/0.8ML IJ SOSY: 480.0000 ug | PREFILLED_SYRINGE | Freq: Every day | INTRAMUSCULAR | Status: DC

## 2015-02-21 MED ORDER — TBO-FILGRASTIM 480 MCG/0.8ML ~~LOC~~ SOSY
480.0000 ug | PREFILLED_SYRINGE | Freq: Once | SUBCUTANEOUS | Status: AC
  Administered 2015-02-21: 480 ug via SUBCUTANEOUS
  Filled 2015-02-21: qty 0.8

## 2015-02-22 ENCOUNTER — Ambulatory Visit (HOSPITAL_BASED_OUTPATIENT_CLINIC_OR_DEPARTMENT_OTHER): Payer: Medicare Other

## 2015-02-22 VITALS — BP 105/23 | HR 58 | Temp 98.1°F

## 2015-02-22 DIAGNOSIS — D61818 Other pancytopenia: Secondary | ICD-10-CM | POA: Diagnosis not present

## 2015-02-22 DIAGNOSIS — C9192 Lymphoid leukemia, unspecified, in relapse: Secondary | ICD-10-CM

## 2015-02-22 DIAGNOSIS — C9112 Chronic lymphocytic leukemia of B-cell type in relapse: Secondary | ICD-10-CM

## 2015-02-22 DIAGNOSIS — D709 Neutropenia, unspecified: Secondary | ICD-10-CM

## 2015-02-22 MED ORDER — FILGRASTIM 480 MCG/0.8ML IJ SOSY
480.0000 ug | PREFILLED_SYRINGE | Freq: Every day | INTRAMUSCULAR | Status: DC
  Filled 2015-02-22: qty 0.8

## 2015-02-22 MED ORDER — TBO-FILGRASTIM 480 MCG/0.8ML ~~LOC~~ SOSY
480.0000 ug | PREFILLED_SYRINGE | Freq: Once | SUBCUTANEOUS | Status: AC
  Administered 2015-02-22: 480 ug via SUBCUTANEOUS
  Filled 2015-02-22: qty 0.8

## 2015-02-23 ENCOUNTER — Other Ambulatory Visit (HOSPITAL_BASED_OUTPATIENT_CLINIC_OR_DEPARTMENT_OTHER): Payer: Medicare Other

## 2015-02-23 ENCOUNTER — Ambulatory Visit (HOSPITAL_BASED_OUTPATIENT_CLINIC_OR_DEPARTMENT_OTHER): Payer: Medicare Other | Admitting: Nurse Practitioner

## 2015-02-23 ENCOUNTER — Ambulatory Visit (HOSPITAL_BASED_OUTPATIENT_CLINIC_OR_DEPARTMENT_OTHER): Payer: Medicare Other

## 2015-02-23 VITALS — BP 105/37 | HR 60 | Temp 97.9°F | Resp 18 | Ht 63.0 in | Wt 209.9 lb

## 2015-02-23 DIAGNOSIS — L89899 Pressure ulcer of other site, unspecified stage: Secondary | ICD-10-CM | POA: Diagnosis not present

## 2015-02-23 DIAGNOSIS — C9112 Chronic lymphocytic leukemia of B-cell type in relapse: Secondary | ICD-10-CM

## 2015-02-23 DIAGNOSIS — C9192 Lymphoid leukemia, unspecified, in relapse: Secondary | ICD-10-CM

## 2015-02-23 DIAGNOSIS — C50412 Malignant neoplasm of upper-outer quadrant of left female breast: Secondary | ICD-10-CM | POA: Diagnosis not present

## 2015-02-23 DIAGNOSIS — Z95828 Presence of other vascular implants and grafts: Secondary | ICD-10-CM

## 2015-02-23 DIAGNOSIS — D61818 Other pancytopenia: Secondary | ICD-10-CM

## 2015-02-23 LAB — CBC WITH DIFFERENTIAL/PLATELET
BASO%: 1.2 % (ref 0.0–2.0)
Basophils Absolute: 0.1 10*3/uL (ref 0.0–0.1)
EOS%: 1.1 % (ref 0.0–7.0)
Eosinophils Absolute: 0.1 10*3/uL (ref 0.0–0.5)
HCT: 24.1 % — ABNORMAL LOW (ref 34.8–46.6)
HGB: 8.2 g/dL — ABNORMAL LOW (ref 11.6–15.9)
LYMPH%: 85.3 % — ABNORMAL HIGH (ref 14.0–49.7)
MCH: 35.7 pg — ABNORMAL HIGH (ref 25.1–34.0)
MCHC: 34.1 g/dL (ref 31.5–36.0)
MCV: 104.7 fL — ABNORMAL HIGH (ref 79.5–101.0)
MONO#: 0.2 10*3/uL (ref 0.1–0.9)
MONO%: 4.4 % (ref 0.0–14.0)
NEUT#: 0.4 10*3/uL — CL (ref 1.5–6.5)
NEUT%: 8 % — AB (ref 38.4–76.8)
Platelets: 61 10*3/uL — ABNORMAL LOW (ref 145–400)
RBC: 2.3 10*6/uL — AB (ref 3.70–5.45)
RDW: 27.2 % — AB (ref 11.2–14.5)
WBC: 5.5 10*3/uL (ref 3.9–10.3)
lymph#: 4.7 10*3/uL — ABNORMAL HIGH (ref 0.9–3.3)

## 2015-02-23 LAB — COMPREHENSIVE METABOLIC PANEL (CC13)
ALT: 29 U/L (ref 0–55)
AST: 27 U/L (ref 5–34)
Albumin: 2.9 g/dL — ABNORMAL LOW (ref 3.5–5.0)
Alkaline Phosphatase: 65 U/L (ref 40–150)
Anion Gap: 8 mEq/L (ref 3–11)
BILIRUBIN TOTAL: 0.55 mg/dL (ref 0.20–1.20)
BUN: 16.7 mg/dL (ref 7.0–26.0)
CO2: 24 meq/L (ref 22–29)
Calcium: 8.5 mg/dL (ref 8.4–10.4)
Chloride: 104 mEq/L (ref 98–109)
Creatinine: 1.2 mg/dL — ABNORMAL HIGH (ref 0.6–1.1)
EGFR: 48 mL/min/{1.73_m2} — AB (ref >90)
Glucose: 130 mg/dl (ref 70–140)
POTASSIUM: 4.3 meq/L (ref 3.5–5.1)
Sodium: 136 mEq/L (ref 136–145)
Total Protein: 5.4 g/dL — ABNORMAL LOW (ref 6.4–8.3)

## 2015-02-23 LAB — LACTATE DEHYDROGENASE (CC13): LDH: 237 U/L (ref 125–245)

## 2015-02-23 LAB — TECHNOLOGIST REVIEW

## 2015-02-23 MED ORDER — HEPARIN SOD (PORK) LOCK FLUSH 100 UNIT/ML IV SOLN
500.0000 [IU] | Freq: Once | INTRAVENOUS | Status: AC
  Administered 2015-02-23: 500 [IU] via INTRAVENOUS
  Filled 2015-02-23: qty 5

## 2015-02-23 MED ORDER — SODIUM CHLORIDE 0.9 % IJ SOLN
10.0000 mL | INTRAMUSCULAR | Status: DC | PRN
  Administered 2015-02-23: 10 mL via INTRAVENOUS
  Filled 2015-02-23: qty 10

## 2015-02-23 NOTE — Patient Instructions (Signed)

## 2015-02-23 NOTE — Progress Notes (Addendum)
[facility] OFFICE PROGRESS NOTE   Diagnosis:  CLL, pancytopenia  INTERVAL HISTORY:   Ms. Lovejoy returns as scheduled. She was hospitalized 02/14/2015 through 02/17/2015 due to worsening/progression of the gluteal ulceration. The ulceration was felt to likely be due to pressure. Local wound care was initiated. She was started on G-CSF in an attempt to raise the neutrophil count hoping this will help with healing.  She is currently residing at a nursing facility. She reports daily dressing changes. She thinks the wound has healed some. Pain is controlled with oral pain medication. She denies fever. Some sweats. No diarrhea. She describes her appetite as "okay".  Objective:  Vital signs in last 24 hours:  Blood pressure 105/37, pulse 60, temperature 97.9 F (36.6 C), temperature source Oral, resp. rate 18, height 5' 3"  (1.6 m), weight 209 lb 14.4 oz (95.21 kg).    HEENT: Small ulceration left upper posterior gumline.  Lymphatics: No palpable cervical or supraclavicular lymph nodes. Bilateral 1-2 cm axillary lymph nodes. Resp: Lungs clear bilaterally. Cardio: Regular rate and rhythm. GI: Abdomen soft and nontender. No hepatomegaly. Vascular: No leg edema.  Skin: Ulcerated wound beginning at the upper gluteal fold extending to near the rectum. Right inguinal ulceration with an adherent dressing that was not removed.  Port-A-Cath without erythema.  Lab Results:  Lab Results  Component Value Date   WBC 5.5 02/23/2015   HGB 8.2* 02/23/2015   HCT 24.1* 02/23/2015   MCV 104.7* 02/23/2015   PLT 61* 02/23/2015   NEUTROABS 0.4* 02/23/2015    Imaging:  No results found.  Medications: I have reviewed the patient's current medications.  Assessment/Plan: 1. Relapsed CLL/well-differentiated lymphocytic lymphoma. Initial diagnosis dates to November 2009. Initially treated with modified fludarabine, Cytoxan and Rituxan with no response. No response to Campath. Partial  response to Arzerra initially given between April and June 2010 and monthly through October 2010. In May 2013 there was deterioration of the blood counts. Arzerra was resumed and she again had a partial response. Blood counts began to fall again in August 2014. She began a trial of Ibrutinib 05/27/2013. It was stopped on 06/10/2013 due to profound fatigue, nausea and anorexia. In addition, labs on 06/10/2013 showed significant deterioration in blood counts with the platelet count down to 3000, felt to be secondary to disease progression as this was how she initially presented. Treatment was initiated with Obinutuzumab and Leukeran on 06/23/2013. Blood counts improved. Obinutuzumab discontinued following cycle 3 due to development of chest tightness requiring hospitalization. She ruled out for an MI. Low dose daily chlorambucil continued until 01/09/2014 when it was placed on hold due to progressive neutropenia/thrombocytopenia.  Bone marrow biopsy 09/06/2014 confirmed involvement with CLL  Initiation of Rituxan/Idelalisib 12/01/2014  Cycle 2 Rituxan 12/22/2014  Cycle 3 Rituxan 01/12/2015  Cycle 4 Rituxan 02/02/2015  Idelalisib placed on hold beginning 02/09/2015 due to continued severe pancytopenia and progression of the ulcer/wound. Rituxan also on hold. 2. Extreme fatigue, anorexia while on Ibrutinib. 3. Multifocal invasive and noninvasive cancers left breast status post mastectomy 10/09/2010 (mpT2,mpN0) currently on tamoxifen. 4. Hypertension. 5. Recurrent atypical neurologic symptoms with dizziness, vertigo, paresthesias. 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 Mild changes in small vessels. No acute changes. Aspirin placed on hold due to severe thrombocytopenia. 6. Pancytopenia secondary to CLL involving the bone marrow. 7. Elevated creatinine (1.7) 11/21/2014. Renal ultrasound 11/24/2014 showed mild left hydronephrosis. No renal calculi identified. CT  scans 12/08/2014 showed marked progression  of lymphadenopathy in the abdomen and pelvis. Kidneys and ureters appeared normal. 8. Ulceration at the gluteal fold and right inguinal region, likely pressure ulcers. She completed a course of doxycycline. Persistent superficial ulceration 01/26/2015. Progressive ulceration with hospitalization 02/14/2015 through 02/17/2015. Treated with ciprofloxacin/doxycycline. G-CSF initiated in an attempt to raise the neutrophil count to help with healing.  Disposition: Ms. Halseth continues to have pancytopenia. Idelalisib and Rituxan have been stopped. Dr. Benay Spice recommends a follow-up appointment with Dr. Lissa Merlin at [facility] for other treatment options.  There has been no significant improvement in the neutrophil count or healing of the wound with initiation of G-CSF. Dr. Benay Spice recommends discontinuation of G-CSF at this point. She has been referred to the wound clinic.  We will continue weekly blood counts. She will return for a follow-up visit in 3 weeks. She will contact the office in the interim with any problems.  Patient seen with Dr. Benay Spice. 25 minutes were spent face-to-face at today's visit with the majority of that time involved in counseling/coordination of care.    Ned Card ANP/GNP-BC   02/23/2015  12:19 PM  This was a shared visit with Ned Card. Ms. Becknell was interviewed and examined.  The neutrophil count has not responded to 1 week of G-CSF. This will be discontinued. She has persistent pancytopenia. Treatment options for the CLL are limited. We will refer her back to Dr. Lissa Merlin to discuss salvage treatment options.  The gluteal ulcer appears slightly improved today. She will continue skin care at the skilled nursing facility and is scheduled for a wound clinic appointment.  She will return for an office visit here in 3 weeks.  [name], M.D.

## 2015-02-27 ENCOUNTER — Telehealth: Payer: Self-pay | Admitting: Oncology

## 2015-02-27 NOTE — Telephone Encounter (Signed)
S/w pt's daughter Edwena Bunde confirming labs/ov same day as visit w/Wound Center. Ennis Forts requested I mailed schedule to her address pt is at a Facility, mailed to Attn: Edwena Bunde [address]. [place], [place] [address], also gave information on address for [place] and advised sent msg to MR to schedule referral for Dr. Phill Myron with [facility]... KJ

## 2015-02-28 ENCOUNTER — Telehealth: Payer: Self-pay | Admitting: Oncology

## 2015-02-28 NOTE — Telephone Encounter (Signed)
Pt appt to see Dr. Phill Myron is 03/12/15@12 :33. Pt's dtr is aware.

## 2015-03-08 ENCOUNTER — Telehealth: Payer: Self-pay | Admitting: *Deleted

## 2015-03-08 NOTE — Telephone Encounter (Signed)
Andria Frames called [facility].  They already have signed orders.  Message left to try to reach patient's daughter

## 2015-03-08 NOTE — Telephone Encounter (Signed)
Patient's daughter Madaline Savage called requesting to "speak with Dr. Benay Spice or Ned Card.  Dr. Benay Spice sign off on wound supplies for Ms. Heuer.  [facility] told me to call Dr Benay Spice as they need a doctor's order to deliver supplies for wound care.  Discharged from Hospital in May.  To [facility] and now at home and can't get supplies needed for wounds.  Dr. Benay Spice is aware of the wounds and i don't know why he told them he could not sign orders." Advised that I will notify Ms. Thomas of the request and to contact [facility] or PCP.  Per Madaline Savage patient's PCP is not aware of the wounds and she doesn't see her until next month.  Ended call stating "I believe [facility] has just become a piece of paper.

## 2015-03-08 NOTE — Telephone Encounter (Signed)
Called and spoke with Verdis Frederickson at [facility] regarding orders for wound dressings.  Verdis Frederickson stated that a wound care nurse has been out to see patient and has taught patient's daughter to dress wound. Verdis Frederickson also stated that there is signed orders from Dr. Benay Spice.  Left message for patient to call back.  Patient is to be seen in wound clinic 6/14.

## 2015-03-09 ENCOUNTER — Telehealth: Payer: Self-pay | Admitting: *Deleted

## 2015-03-09 NOTE — Telephone Encounter (Signed)
VM message received @ 12:35 pm from pt's sister, Bill Salinas. TC back to gail and she is asking from whom and when wound care supplies will be delivered. Review chart and pt had referral from [facility] for wound care. Supplies to come from them. # to [facility] given to Baker Janus so that she can call and ask about the supplies.  Baker Janus verbalized understanding.

## 2015-03-09 NOTE — Telephone Encounter (Signed)
Called and informed patient's daughter Ennis Forts) to let her know that [facility] has signed orders from Dr. Benay Spice regarding wound care.  Patient's daughter Ennis Forts) verbalized understanding.

## 2015-03-13 ENCOUNTER — Telehealth: Payer: Self-pay | Admitting: Oncology

## 2015-03-13 ENCOUNTER — Ambulatory Visit (HOSPITAL_BASED_OUTPATIENT_CLINIC_OR_DEPARTMENT_OTHER): Payer: Medicare Other

## 2015-03-13 ENCOUNTER — Other Ambulatory Visit: Payer: Self-pay

## 2015-03-13 ENCOUNTER — Other Ambulatory Visit (HOSPITAL_BASED_OUTPATIENT_CLINIC_OR_DEPARTMENT_OTHER): Payer: Medicare Other

## 2015-03-13 ENCOUNTER — Encounter (HOSPITAL_BASED_OUTPATIENT_CLINIC_OR_DEPARTMENT_OTHER): Payer: Medicare Other | Attending: General Surgery

## 2015-03-13 ENCOUNTER — Ambulatory Visit (HOSPITAL_BASED_OUTPATIENT_CLINIC_OR_DEPARTMENT_OTHER): Payer: Medicare Other | Admitting: Nurse Practitioner

## 2015-03-13 VITALS — BP 151/51 | HR 82 | Temp 98.2°F | Resp 18 | Ht 63.0 in | Wt 195.0 lb

## 2015-03-13 DIAGNOSIS — C9112 Chronic lymphocytic leukemia of B-cell type in relapse: Secondary | ICD-10-CM

## 2015-03-13 DIAGNOSIS — I10 Essential (primary) hypertension: Secondary | ICD-10-CM | POA: Diagnosis not present

## 2015-03-13 DIAGNOSIS — F0390 Unspecified dementia without behavioral disturbance: Secondary | ICD-10-CM | POA: Insufficient documentation

## 2015-03-13 DIAGNOSIS — G629 Polyneuropathy, unspecified: Secondary | ICD-10-CM | POA: Diagnosis not present

## 2015-03-13 DIAGNOSIS — D61818 Other pancytopenia: Secondary | ICD-10-CM

## 2015-03-13 DIAGNOSIS — Z880 Allergy status to penicillin: Secondary | ICD-10-CM | POA: Insufficient documentation

## 2015-03-13 DIAGNOSIS — C8590 Non-Hodgkin lymphoma, unspecified, unspecified site: Secondary | ICD-10-CM | POA: Diagnosis not present

## 2015-03-13 DIAGNOSIS — D709 Neutropenia, unspecified: Secondary | ICD-10-CM

## 2015-03-13 DIAGNOSIS — L98491 Non-pressure chronic ulcer of skin of other sites limited to breakdown of skin: Secondary | ICD-10-CM | POA: Insufficient documentation

## 2015-03-13 DIAGNOSIS — M1990 Unspecified osteoarthritis, unspecified site: Secondary | ICD-10-CM | POA: Insufficient documentation

## 2015-03-13 DIAGNOSIS — C50412 Malignant neoplasm of upper-outer quadrant of left female breast: Secondary | ICD-10-CM | POA: Diagnosis not present

## 2015-03-13 DIAGNOSIS — Z9221 Personal history of antineoplastic chemotherapy: Secondary | ICD-10-CM | POA: Insufficient documentation

## 2015-03-13 DIAGNOSIS — L98411 Non-pressure chronic ulcer of buttock limited to breakdown of skin: Secondary | ICD-10-CM | POA: Insufficient documentation

## 2015-03-13 DIAGNOSIS — Z95828 Presence of other vascular implants and grafts: Secondary | ICD-10-CM

## 2015-03-13 DIAGNOSIS — C9192 Lymphoid leukemia, unspecified, in relapse: Secondary | ICD-10-CM

## 2015-03-13 LAB — CBC WITH DIFFERENTIAL/PLATELET
BASO%: 2.9 % — AB (ref 0.0–2.0)
Basophils Absolute: 0.1 10*3/uL (ref 0.0–0.1)
EOS ABS: 0.1 10*3/uL (ref 0.0–0.5)
EOS%: 1.6 % (ref 0.0–7.0)
HCT: 23.7 % — ABNORMAL LOW (ref 34.8–46.6)
HGB: 8 g/dL — ABNORMAL LOW (ref 11.6–15.9)
LYMPH%: 73.9 % — AB (ref 14.0–49.7)
MCH: 35.9 pg — AB (ref 25.1–34.0)
MCHC: 33.8 g/dL (ref 31.5–36.0)
MCV: 106.3 fL — ABNORMAL HIGH (ref 79.5–101.0)
MONO#: 0.8 10*3/uL (ref 0.1–0.9)
MONO%: 18.5 % — ABNORMAL HIGH (ref 0.0–14.0)
NEUT#: 0.1 10*3/uL — CL (ref 1.5–6.5)
NEUT%: 3.1 % — AB (ref 38.4–76.8)
PLATELETS: 108 10*3/uL — AB (ref 145–400)
RBC: 2.23 10*6/uL — ABNORMAL LOW (ref 3.70–5.45)
WBC: 4.5 10*3/uL (ref 3.9–10.3)
lymph#: 3.3 10*3/uL (ref 0.9–3.3)

## 2015-03-13 LAB — HOLD TUBE, BLOOD BANK

## 2015-03-13 MED ORDER — SODIUM CHLORIDE 0.9 % IJ SOLN
10.0000 mL | INTRAMUSCULAR | Status: DC | PRN
  Administered 2015-03-13: 10 mL via INTRAVENOUS
  Filled 2015-03-13: qty 10

## 2015-03-13 MED ORDER — HEPARIN SOD (PORK) LOCK FLUSH 100 UNIT/ML IV SOLN
500.0000 [IU] | Freq: Once | INTRAVENOUS | Status: AC
  Administered 2015-03-13: 500 [IU] via INTRAVENOUS
  Filled 2015-03-13: qty 5

## 2015-03-13 NOTE — Patient Instructions (Signed)

## 2015-03-13 NOTE — Progress Notes (Signed)
[facility] OFFICE PROGRESS NOTE   Diagnosis:  CLL, pancytopenia  INTERVAL HISTORY:   [name] returns as scheduled. She reports being seen at the wound clinic earlier today. Culture and biopsies were obtained. The wound was dressed. She continues to have pain associated with the wound. She also notes a small amount of bleeding associated with the wound. She denies fever. No shaking chills. She continues to have periodic sweats. Appetite is poor. She continues to lose weight. She reports stools have been "dark green almost black" for the past several weeks. She denies taking any nonsteroidals. No oral iron or Pepto-Bismol.  She reports a recent visit with Dr. Lissa Merlin at [facility]. A bone marrow biopsy was recommended. She has not scheduled this yet.  Objective:  Vital signs in last 24 hours:  Blood pressure 151/51, pulse 82, temperature 98.2 F (36.8 C), temperature source Oral, resp. rate 18, height 5' 3"  (1.6 m), weight 195 lb (88.451 kg), SpO2 100 %.    HEENT: No thrush or ulcers. Lymphatics: Persistent bilateral 1-2 cm axillary lymph nodes. Resp: Lungs clear bilaterally. Cardio: Regular rate and rhythm. GI: Abdomen soft and nontender. No organomegaly. Vascular: No leg edema.  Skin: She requested the wound dressing that was placed earlier this morning at the wound clinic not be removed. Port-A-Cath without erythema.    Lab Results:  Lab Results  Component Value Date   WBC 4.5 03/13/2015   HGB 8.0* 03/13/2015   HCT 23.7* 03/13/2015   MCV 106.3* 03/13/2015   PLT 108* 03/13/2015   NEUTROABS 0.1* 03/13/2015    Imaging:  No results found.  Medications: I have reviewed the patient's current medications.  Assessment/Plan: 1. Relapsed CLL/well-differentiated lymphocytic lymphoma. Initial diagnosis dates to November 2009. Initially treated with modified fludarabine, Cytoxan and Rituxan with no response. No response to Campath. Partial response to Arzerra  initially given between April and June 2010 and monthly through October 2010. In May 2013 there was deterioration of the blood counts. Arzerra was resumed and she again had a partial response. Blood counts began to fall again in August 2014. She began a trial of Ibrutinib 05/27/2013. It was stopped on 06/10/2013 due to profound fatigue, nausea and anorexia. In addition, labs on 06/10/2013 showed significant deterioration in blood counts with the platelet count down to 3000, felt to be secondary to disease progression as this was how she initially presented. Treatment was initiated with Obinutuzumab and Leukeran on 06/23/2013. Blood counts improved. Obinutuzumab discontinued following cycle 3 due to development of chest tightness requiring hospitalization. She ruled out for an MI. Low dose daily chlorambucil continued until 01/09/2014 when it was placed on hold due to progressive neutropenia/thrombocytopenia.  Bone marrow biopsy 09/06/2014 confirmed involvement with CLL  Initiation of Rituxan/Idelalisib 12/01/2014  Cycle 2 Rituxan 12/22/2014  Cycle 3 Rituxan 01/12/2015  Cycle 4 Rituxan 02/02/2015  Idelalisib placed on hold beginning 02/09/2015 due to continued severe pancytopenia and progression of the ulcer/wound. Rituxan also on hold. 2. Extreme fatigue, anorexia while on Ibrutinib. 3. Multifocal invasive and noninvasive cancers left breast status post mastectomy 10/09/2010 (mpT2,mpN0) currently on tamoxifen. 4. Hypertension. 5. Recurrent atypical neurologic symptoms with dizziness, vertigo, paresthesias. 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 Mild changes in small vessels. No acute changes. Aspirin placed on hold due to severe thrombocytopenia. 6. Pancytopenia secondary to CLL involving the bone marrow. 7. Elevated creatinine (1.7) 11/21/2014. Renal ultrasound 11/24/2014 showed mild left hydronephrosis. No renal calculi identified. CT  scans 12/08/2014  showed marked progression of lymphadenopathy in the abdomen and pelvis. Kidneys and ureters appeared normal. 8. Ulceration at the gluteal fold and right inguinal region, likely pressure ulcers. She completed a course of doxycycline. Persistent superficial ulceration 01/26/2015. Progressive ulceration with hospitalization 02/14/2015 through 02/17/2015. Treated with ciprofloxacin/doxycycline. G-CSF initiated in an attempt to raise the neutrophil count to help with healing. There was no significant improvement and G-CSF was discontinued. She is now followed at the wound clinic.   Disposition: [name] continues to have pancytopenia with severe neutropenia. She recently saw Dr. Lissa Merlin at [facility]. A bone marrow biopsy was recommended. We strongly encouraged Ms. Drapeau to proceed with this as soon as possible. She plans to call Dr. Eustace Moore office today.  Due to the severe neutropenia we recommended that she resume ciprofloxacin.  We discussed the dark stools. Her hemoglobin is stable. Stool Hemoccult will likely not be reliable due to the bleeding wound. We will monitor her blood counts closely and transfuse as needed. She understands to contact the office with increased dark stools.  She will continue follow-up at the wound clinic.  She will return in one week and 2 weeks for blood counts. We will see her in follow-up in 3 weeks. She will contact the office in the interim as outlined above or with any other problems.  Plan reviewed with Dr. Benay Spice.  25 minutes were spent face-to-face at today's visit with the majority of that time involved in counseling/coordination of care.    Ned Card ANP/GNP-BC   03/13/2015  1:19 PM

## 2015-03-13 NOTE — Telephone Encounter (Signed)
Gave and printed appt sched and avs fo rpt for June adn July

## 2015-03-20 DIAGNOSIS — L98491 Non-pressure chronic ulcer of skin of other sites limited to breakdown of skin: Secondary | ICD-10-CM | POA: Diagnosis not present

## 2015-03-20 DIAGNOSIS — L98411 Non-pressure chronic ulcer of buttock limited to breakdown of skin: Secondary | ICD-10-CM | POA: Diagnosis not present

## 2015-03-20 DIAGNOSIS — C8590 Non-Hodgkin lymphoma, unspecified, unspecified site: Secondary | ICD-10-CM | POA: Diagnosis not present

## 2015-03-20 DIAGNOSIS — I10 Essential (primary) hypertension: Secondary | ICD-10-CM | POA: Diagnosis not present

## 2015-03-21 ENCOUNTER — Other Ambulatory Visit: Payer: Self-pay | Admitting: *Deleted

## 2015-03-21 ENCOUNTER — Emergency Department (HOSPITAL_COMMUNITY)
Admission: EM | Admit: 2015-03-21 | Discharge: 2015-03-22 | Disposition: A | Discharge status: Home / Self Care | Payer: Medicare Other | Attending: Emergency Medicine | Admitting: Emergency Medicine

## 2015-03-21 ENCOUNTER — Encounter (HOSPITAL_COMMUNITY): Payer: Self-pay

## 2015-03-21 ENCOUNTER — Ambulatory Visit (HOSPITAL_COMMUNITY)
Admission: RE | Admit: 2015-03-21 | Discharge: 2015-03-21 | Disposition: A | Discharge status: Home / Self Care | Payer: Medicare Other | Source: Ambulatory Visit | Attending: Oncology | Admitting: Oncology

## 2015-03-21 ENCOUNTER — Ambulatory Visit (HOSPITAL_BASED_OUTPATIENT_CLINIC_OR_DEPARTMENT_OTHER): Payer: Medicare Other

## 2015-03-21 ENCOUNTER — Other Ambulatory Visit (HOSPITAL_BASED_OUTPATIENT_CLINIC_OR_DEPARTMENT_OTHER): Payer: Medicare Other

## 2015-03-21 ENCOUNTER — Ambulatory Visit: Payer: Medicare Other

## 2015-03-21 ENCOUNTER — Other Ambulatory Visit: Payer: Self-pay | Admitting: Oncology

## 2015-03-21 VITALS — BP 141/53 | HR 75 | Temp 98.6°F | Resp 18

## 2015-03-21 VITALS — BP 160/52 | HR 84 | Temp 99.5°F | Resp 18

## 2015-03-21 DIAGNOSIS — D61818 Other pancytopenia: Secondary | ICD-10-CM | POA: Diagnosis not present

## 2015-03-21 DIAGNOSIS — Z880 Allergy status to penicillin: Secondary | ICD-10-CM | POA: Insufficient documentation

## 2015-03-21 DIAGNOSIS — C50412 Malignant neoplasm of upper-outer quadrant of left female breast: Secondary | ICD-10-CM

## 2015-03-21 DIAGNOSIS — Z8572 Personal history of non-Hodgkin lymphomas: Secondary | ICD-10-CM | POA: Insufficient documentation

## 2015-03-21 DIAGNOSIS — Z8739 Personal history of other diseases of the musculoskeletal system and connective tissue: Secondary | ICD-10-CM | POA: Diagnosis not present

## 2015-03-21 DIAGNOSIS — C9112 Chronic lymphocytic leukemia of B-cell type in relapse: Secondary | ICD-10-CM | POA: Diagnosis not present

## 2015-03-21 DIAGNOSIS — Z8673 Personal history of transient ischemic attack (TIA), and cerebral infarction without residual deficits: Secondary | ICD-10-CM | POA: Insufficient documentation

## 2015-03-21 DIAGNOSIS — L98499 Non-pressure chronic ulcer of skin of other sites with unspecified severity: Secondary | ICD-10-CM | POA: Insufficient documentation

## 2015-03-21 DIAGNOSIS — R21 Rash and other nonspecific skin eruption: Secondary | ICD-10-CM | POA: Insufficient documentation

## 2015-03-21 DIAGNOSIS — Z862 Personal history of diseases of the blood and blood-forming organs and certain disorders involving the immune mechanism: Secondary | ICD-10-CM | POA: Diagnosis not present

## 2015-03-21 DIAGNOSIS — C9192 Lymphoid leukemia, unspecified, in relapse: Secondary | ICD-10-CM

## 2015-03-21 DIAGNOSIS — D649 Anemia, unspecified: Secondary | ICD-10-CM

## 2015-03-21 DIAGNOSIS — Z853 Personal history of malignant neoplasm of breast: Secondary | ICD-10-CM | POA: Diagnosis not present

## 2015-03-21 DIAGNOSIS — Z8639 Personal history of other endocrine, nutritional and metabolic disease: Secondary | ICD-10-CM | POA: Insufficient documentation

## 2015-03-21 DIAGNOSIS — Z91040 Latex allergy status: Secondary | ICD-10-CM | POA: Diagnosis not present

## 2015-03-21 DIAGNOSIS — Y848 Other medical procedures as the cause of abnormal reaction of the patient, or of later complication, without mention of misadventure at the time of the procedure: Secondary | ICD-10-CM | POA: Diagnosis not present

## 2015-03-21 DIAGNOSIS — T8092XA Unspecified transfusion reaction, initial encounter: Secondary | ICD-10-CM | POA: Insufficient documentation

## 2015-03-21 DIAGNOSIS — Z79899 Other long term (current) drug therapy: Secondary | ICD-10-CM | POA: Diagnosis not present

## 2015-03-21 DIAGNOSIS — R300 Dysuria: Secondary | ICD-10-CM | POA: Insufficient documentation

## 2015-03-21 DIAGNOSIS — Z95828 Presence of other vascular implants and grafts: Secondary | ICD-10-CM

## 2015-03-21 DIAGNOSIS — I10 Essential (primary) hypertension: Secondary | ICD-10-CM | POA: Insufficient documentation

## 2015-03-21 DIAGNOSIS — R509 Fever, unspecified: Secondary | ICD-10-CM | POA: Diagnosis present

## 2015-03-21 LAB — COMPREHENSIVE METABOLIC PANEL
ALT: 43 U/L (ref 14–54)
AST: 46 U/L — AB (ref 15–41)
Albumin: 3.3 g/dL — ABNORMAL LOW (ref 3.5–5.0)
Alkaline Phosphatase: 77 U/L (ref 38–126)
Anion gap: 10 (ref 5–15)
BUN: 23 mg/dL — ABNORMAL HIGH (ref 6–20)
CO2: 23 mmol/L (ref 22–32)
Calcium: 8.6 mg/dL — ABNORMAL LOW (ref 8.9–10.3)
Chloride: 101 mmol/L (ref 101–111)
Creatinine, Ser: 1.26 mg/dL — ABNORMAL HIGH (ref 0.44–1.00)
GFR calc Af Amer: 46 mL/min — ABNORMAL LOW (ref >60)
GFR, EST NON AFRICAN AMERICAN: 39 mL/min — AB (ref >60)
Glucose, Bld: 120 mg/dL — ABNORMAL HIGH (ref 65–99)
POTASSIUM: 3.3 mmol/L — AB (ref 3.5–5.1)
SODIUM: 134 mmol/L — AB (ref 135–145)
Total Bilirubin: 1.8 mg/dL — ABNORMAL HIGH (ref 0.3–1.2)
Total Protein: 6.2 g/dL — ABNORMAL LOW (ref 6.5–8.1)

## 2015-03-21 LAB — URINALYSIS, ROUTINE W REFLEX MICROSCOPIC
Bilirubin Urine: NEGATIVE
Glucose, UA: NEGATIVE mg/dL
KETONES UR: NEGATIVE mg/dL
NITRITE: NEGATIVE
PH: 5.5 (ref 5.0–8.0)
Protein, ur: NEGATIVE mg/dL
Specific Gravity, Urine: 1.015 (ref 1.005–1.030)
Urobilinogen, UA: 1 mg/dL (ref 0.0–1.0)

## 2015-03-21 LAB — URINE MICROSCOPIC-ADD ON

## 2015-03-21 LAB — CBC WITH DIFFERENTIAL/PLATELET
BASO%: 1.1 % (ref 0.0–2.0)
Basophils Absolute: 0.1 10*3/uL (ref 0.0–0.1)
EOS ABS: 0.1 10*3/uL (ref 0.0–0.5)
EOS%: 1 % (ref 0.0–7.0)
HCT: 21.6 % — ABNORMAL LOW (ref 34.8–46.6)
HGB: 7.2 g/dL — ABNORMAL LOW (ref 11.6–15.9)
LYMPH%: 81.8 % — AB (ref 14.0–49.7)
MCH: 35.3 pg — ABNORMAL HIGH (ref 25.1–34.0)
MCHC: 33.3 g/dL (ref 31.5–36.0)
MCV: 105.9 fL — ABNORMAL HIGH (ref 79.5–101.0)
MONO#: 0.8 10*3/uL (ref 0.1–0.9)
MONO%: 14.6 % — ABNORMAL HIGH (ref 0.0–14.0)
NEUT#: 0.1 10*3/uL — CL (ref 1.5–6.5)
NEUT%: 1.5 % — AB (ref 38.4–76.8)
PLATELETS: 82 10*3/uL — AB (ref 145–400)
RBC: 2.04 10*6/uL — ABNORMAL LOW (ref 3.70–5.45)
RDW: 22 % — ABNORMAL HIGH (ref 11.2–14.5)
WBC: 5.2 10*3/uL (ref 3.9–10.3)
lymph#: 4.3 10*3/uL — ABNORMAL HIGH (ref 0.9–3.3)
nRBC: 0 % (ref 0–0)

## 2015-03-21 LAB — HOLD TUBE, BLOOD BANK

## 2015-03-21 LAB — PREPARE RBC (CROSSMATCH)

## 2015-03-21 LAB — TECHNOLOGIST REVIEW

## 2015-03-21 MED ORDER — HEPARIN SOD (PORK) LOCK FLUSH 100 UNIT/ML IV SOLN
500.0000 [IU] | Freq: Every day | INTRAVENOUS | Status: AC | PRN
  Administered 2015-03-21: 500 [IU]
  Filled 2015-03-21: qty 5

## 2015-03-21 MED ORDER — SODIUM CHLORIDE 0.9 % IJ SOLN
10.0000 mL | INTRAMUSCULAR | Status: DC | PRN
  Filled 2015-03-21: qty 10

## 2015-03-21 MED ORDER — HEPARIN SOD (PORK) LOCK FLUSH 100 UNIT/ML IV SOLN
500.0000 [IU] | Freq: Once | INTRAVENOUS | Status: AC
  Administered 2015-03-21: 500 [IU] via INTRAVENOUS
  Filled 2015-03-21: qty 5

## 2015-03-21 MED ORDER — SODIUM CHLORIDE 0.9 % IJ SOLN
10.0000 mL | INTRAMUSCULAR | Status: AC | PRN
  Administered 2015-03-21: 10 mL
  Filled 2015-03-21: qty 10

## 2015-03-21 MED ORDER — SODIUM CHLORIDE 0.9 % IJ SOLN
10.0000 mL | INTRAMUSCULAR | Status: DC | PRN
  Administered 2015-03-21: 10 mL via INTRAVENOUS
  Filled 2015-03-21: qty 10

## 2015-03-21 MED ORDER — HEPARIN SOD (PORK) LOCK FLUSH 100 UNIT/ML IV SOLN
500.0000 [IU] | Freq: Every day | INTRAVENOUS | Status: DC | PRN
  Filled 2015-03-21: qty 5

## 2015-03-21 MED ORDER — CIPROFLOXACIN HCL 250 MG PO TABS: 250.0000 mg | ORAL_TABLET | Freq: Two times a day (BID) | ORAL | Status: DC

## 2015-03-21 MED ORDER — DIPHENHYDRAMINE HCL 25 MG PO CAPS
25.0000 mg | ORAL_CAPSULE | Freq: Once | ORAL | Status: AC
  Administered 2015-03-21: 25 mg via ORAL

## 2015-03-21 MED ORDER — ACETAMINOPHEN 325 MG PO TABS
ORAL_TABLET | ORAL | Status: AC
  Filled 2015-03-21: qty 2

## 2015-03-21 MED ORDER — SODIUM CHLORIDE 0.9 % IV SOLN
250.0000 mL | Freq: Once | INTRAVENOUS | Status: AC
  Administered 2015-03-21: 250 mL via INTRAVENOUS

## 2015-03-21 MED ORDER — HEPARIN SOD (PORK) LOCK FLUSH 100 UNIT/ML IV SOLN
INTRAVENOUS | Status: DC
Start: 2015-03-21 — End: 2015-03-22
  Filled 2015-03-21: qty 5

## 2015-03-21 MED ORDER — DIPHENHYDRAMINE HCL 25 MG PO CAPS
ORAL_CAPSULE | ORAL | Status: AC
  Filled 2015-03-21: qty 1

## 2015-03-21 MED ORDER — SODIUM CHLORIDE 0.9 % IV SOLN
250.0000 mL | Freq: Once | INTRAVENOUS | Status: AC
Start: 2015-03-21 — End: 2015-03-21
  Administered 2015-03-21: 250 mL via INTRAVENOUS

## 2015-03-21 MED ORDER — CIPROFLOXACIN HCL 500 MG PO TABS: 500.0000 mg | ORAL_TABLET | Freq: Every day | ORAL | Status: DC

## 2015-03-21 MED ORDER — ACETAMINOPHEN 325 MG PO TABS
650.0000 mg | ORAL_TABLET | Freq: Once | ORAL | Status: AC
  Administered 2015-03-21: 650 mg via ORAL

## 2015-03-21 NOTE — ED Notes (Signed)
Bed: ZQ94 Expected date:  Expected time:  Means of arrival:  Comments: [facility] pt/blood tx reaction

## 2015-03-21 NOTE — Progress Notes (Signed)
17:42-Blood tranfusion completed.  Vitals obtained, BP elevated; however, pt just ambulated to bathroom but temp found to be 100.5.  I asked pt if she received tylenol and benadryl prior to transfusion in which she said she did not but usually does.  Pt reported no not feeling feverish and no other complaints.  I spoke with Selena Lesser, NP about elevated temp and no premeds and was given the order to administer 650mg  of tylenol and 25mg  of bendaryl.  After asking patient if any other side effects, she stated no she was fine and ready for discharge.  I instruced pt and pt's daughter that if any other hypersensitivity symptoms were to arise to take pt to ED.  Daughter then mentioned pt was shaking in the bathroom.  Pt stated she was just cold and felt fine.  Instructed pt and family if this started again after d/c to also take pt to ED.  After full discussion, pt finally stated "I just don't feel right."  Maybe I should stay."  Pt then verbalized she wanted to go to ED.  Spoke with Selena Lesser, NP and report was called to ED.    18:10-Pt taken to [facility] for observation with mask in place, she was noted to have red palms but otherwise no changes since d/c from infusion room.  Report was given to RN which included that pt's [place] was 0.1.  No further questions from pt or daughter prior to leaving ED.

## 2015-03-21 NOTE — ED Notes (Signed)
Pt, brought over by [facility], c/o increased fever, bilateral palm redness, and generally "not feeling right" after receiving 2 units of blood this afternoon.  Pt's fever increased to 100.5.  Denies new pain.  Patient was not pre-medicated prior to transfusions and typically receives Tylenol and Benadryl.  Prior to ED transfer, Pt received Benadryl 25mg  and Tylenol 650mg .  Hx of CLL.  Unknown last chemo.  Pt has had similar reaction to chemo previously.  Pt has severe pressure wounds and is followed by the Wound Ctr.    [facility] reports that the Pt is neutropenic w/ ANC 0.1.

## 2015-03-21 NOTE — ED Provider Notes (Signed)
CSN: [id number]     Arrival date & time 03/21/15  1812 History   First MD Initiated Contact with Patient 03/21/15 1824     Chief Complaint  Patient presents with  . Fever  . Possible Transfusion Reaction      (Consider location/radiation/quality/duration/timing/severity/associated sxs/prior Treatment) HPI  81 year old female presents from the cancer center after having a temperature of 100.5 after a blood transfusion. Very shortly after a blood transfusion she developed redness to her hands and a low-grade temperature as above. The patient typically receives Benadryl and Tylenol before getting blood transfusions but did not today. The cancer center did give her 25 mg Benadryl and 650 mg Tylenol just prior to arrival. Patient has been having neutropenia over the past 3 weeks. No cough or dyspnea. No headache, chest pain. Chronic dysuria, attributed to her vaginal ulcerations since chemo. Last chemo over 1 month ago.  Past Medical History  Diagnosis Date  . 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 Stroke 05/2009    05/2009  Right upper extrem/leg weakness; nuchal pain;  . Benign essential HTN   . Macrocytic anemia   . Vertigo     ?TIA - MRI brain 5/13  No new CVA  . Pleurisy     01/16/12 pleuritic right chest pain  V/Q scan [facility] low probability PE  . Breast cancer left T2N0 S/P mastectomy and SLN Bx 09/2010 2009  . CLL (chronic lymphoid leukemia) in relapse   . Arthritis   . Hyperlipidemia   . Thrombocytopenia, unspecified 06/15/2013   Past Surgical History  Procedure Laterality Date  . Tonsillectomy    . Abdominal hysterectomy    . Right parotid gland    . Hemorrhoid surgery    . Breast biopsy Left 2009  . Mastectomy  02/ 2012    left breast  . Porta-cath      right chest   Family History  Problem Relation Age of Onset  . Breast cancer Mother   .  Hyperlipidemia    . Stroke    . Hypertension    . Arthritis     History  Substance Use Topics  . Smoking status: Never Smoker   . Smokeless tobacco: Never Used  . Alcohol Use: No   OB History    No data available     Review of Systems  Constitutional: Positive for fever.  Respiratory: Negative for cough and shortness of breath.   Gastrointestinal: Negative for vomiting and abdominal pain.  Genitourinary: Positive for dysuria (chronic due to vaginal lesions).  Skin: Positive for rash.  All other systems reviewed and are negative.     Allergies  Obinutuzumab; Penicillins; and Latex  Home Medications   Prior to Admission medications   Medication Sig Start Date End Date Taking? Authorizing Provider  acyclovir (ZOVIRAX) 400 MG tablet Take 1 tablet (400 mg total) by mouth daily. 11/03/14   Rowe Clack, MD  amLODipine (NORVASC) 10 MG tablet Take 1 tablet (10 mg total) by mouth daily. 07/07/13   Domenic Polite, MD  atenolol (TENORMIN) 50 MG tablet Take 1 tablet (50 mg total) by mouth daily. 09/13/14   Rowe Clack, MD  ciprofloxacin (CIPRO) 500 MG tablet Take 1 tablet (500 mg total) by mouth daily. 03/21/15   Owens Shark, NP  clotrimazole (LOTRIMIN) 1 % cream Apply topically 2 (two) times daily. To wound 02/16/15  Shanker Kristeen Mans, MD  donepezil (ARICEPT) 5 MG tablet Take 1 tablet (5 mg total) by mouth at bedtime. 09/04/14   Rowe Clack, MD  doxycycline (VIBRA-TABS) 100 MG tablet Take 1 tablet (100 mg total) by mouth 2 (two) times daily. For 7 more days from 02/17/15 02/16/15   Jonetta Osgood, MD  hydrochlorothiazide (MICROZIDE) 12.5 MG capsule Take 1 capsule (12.5 mg total) by mouth daily. 09/04/14   Rowe Clack, MD  meclizine (ANTIVERT) 12.5 MG tablet Take 1 tablet (12.5 mg total) by mouth 3 (three) times daily as needed for dizziness. 09/04/14   Rowe Clack, MD  oxyCODONE-acetaminophen (PERCOCET) 7.5-325 MG per tablet Take 1-2 tablets by mouth every  6 (six) hours as needed for moderate pain. 02/16/15   Shanker Kristeen Mans, MD  pantoprazole (PROTONIX) 40 MG tablet TAKE 1 TABLET (40 MG TOTAL) BY MOUTH DAILY. 10/09/14   Rowe Clack, MD  polyethylene glycol (MIRALAX / GLYCOLAX) packet Take 17 g by mouth daily. 02/16/15   Shanker Kristeen Mans, MD  potassium chloride SA (KLOR-CON M20) 20 MEQ tablet Take 1 tablet (20 mEq total) by mouth daily. Patient taking differently: Take 20 mEq by mouth daily as needed (for cramps).  09/04/14   Rowe Clack, MD  prochlorperazine (COMPAZINE) 10 MG tablet Take 1 tablet (10 mg total) by mouth every 6 (six) hours as needed. For nausea Patient taking differently: Take 10 mg by mouth every 6 (six) hours as needed for nausea.  01/26/15   Owens Shark, NP  tamoxifen (NOLVADEX) 20 MG tablet TAKE 1 TABLET (20 MG TOTAL) BY MOUTH DAILY. 01/26/15   Owens Shark, NP   BP 154/52 mmHg  Pulse 85  Temp(Src) 99 F (37.2 C) (Oral)  Resp 16  SpO2 99% Physical Exam  Constitutional: She is oriented to person, place, and time. She appears well-developed and well-nourished.  HENT:  Head: Normocephalic and atraumatic.  Right Ear: External ear normal.  Left Ear: External ear normal.  Nose: Nose normal.  Eyes: Right eye exhibits no discharge. Left eye exhibits no discharge.  Neck: Neck supple.  Cardiovascular: Normal rate, regular rhythm and normal heart sounds.   Pulmonary/Chest: Effort normal and breath sounds normal.  Chest port in place, no redness/swelling  Abdominal: Soft. She exhibits no distension. There is no tenderness.  Genitourinary:     Musculoskeletal:       Back:  Neurological: She is alert and oriented to person, place, and time.  Skin: Skin is warm and dry.  Nursing note and vitals reviewed.   ED Course  Procedures (including critical care time) Labs Review Labs Reviewed  URINALYSIS, ROUTINE W REFLEX MICROSCOPIC (NOT AT [facility]) - Abnormal; Notable for the following:    Color, Urine AMBER (*)      APPearance CLOUDY (*)    Hgb urine dipstick SMALL (*)    Leukocytes, UA MODERATE (*)    All other components within normal limits  COMPREHENSIVE METABOLIC PANEL - Abnormal; Notable for the following:    Sodium 134 (*)    Potassium 3.3 (*)    Glucose, Bld 120 (*)    BUN 23 (*)    Creatinine, Ser 1.26 (*)    Calcium 8.6 (*)    Total Protein 6.2 (*)    Albumin 3.3 (*)    AST 46 (*)    Total Bilirubin 1.8 (*)    GFR calc non Af Amer 39 (*)    GFR calc Af Amer 46 (*)  All other components within normal limits  URINE MICROSCOPIC-ADD ON - Abnormal; Notable for the following:    Squamous Epithelial / LPF MANY (*)    Bacteria, UA MANY (*)    All other components within normal limits  CULTURE, BLOOD (ROUTINE X 2)  CULTURE, BLOOD (ROUTINE X 2)  URINE CULTURE    Imaging Review No results found.   EKG Interpretation None      MDM   Final diagnoses:  Blood transfusion reaction, initial encounter    Patient's low-grade temperature most likely related to the blood transfusion, especially because she had no fever before and this low-grade temperature occurred very soon after. Patient has dysuria but this appears chronic. She has a dirty catch urine but does have moderate leukocytes. I discussed her case with the oncologist on call, Dr. Lindi Adie, who recommends blood cultures and oral antibody treatment for possible UTI but this point feels that her fever was most likely blood transfusion related and would not admit her for possible neutropenic fever. However she were to develop fever at home, she is instructed to call her oncologist and she will likely be direct admitted. Patient has felt well since arrival and has not spiked another fever, is current stable for discharge.     Sherwood Gambler, MD 03/21/15 [phone]

## 2015-03-21 NOTE — Patient Instructions (Signed)

## 2015-03-21 NOTE — ED Notes (Signed)
Pt placed on neutropenic isolation.  This RN could not find the order in [place].

## 2015-03-21 NOTE — Patient Instructions (Signed)

## 2015-03-22 LAB — TYPE AND SCREEN
ABO/RH(D): A POS
Antibody Screen: NEGATIVE
UNIT DIVISION: 0
UNIT DIVISION: 0

## 2015-03-23 LAB — URINE CULTURE

## 2015-03-26 LAB — CULTURE, BLOOD (ROUTINE X 2)
CULTURE: NO GROWTH
Culture: NO GROWTH

## 2015-03-28 ENCOUNTER — Other Ambulatory Visit (HOSPITAL_BASED_OUTPATIENT_CLINIC_OR_DEPARTMENT_OTHER): Payer: Medicare Other

## 2015-03-28 ENCOUNTER — Ambulatory Visit (HOSPITAL_BASED_OUTPATIENT_CLINIC_OR_DEPARTMENT_OTHER): Payer: Medicare Other

## 2015-03-28 ENCOUNTER — Encounter: Payer: Self-pay | Admitting: Internal Medicine

## 2015-03-28 ENCOUNTER — Ambulatory Visit (INDEPENDENT_AMBULATORY_CARE_PROVIDER_SITE_OTHER): Payer: Medicare Other | Admitting: Internal Medicine

## 2015-03-28 VITALS — BP 145/47

## 2015-03-28 VITALS — BP 118/62 | HR 75 | Temp 98.4°F | Ht 63.0 in | Wt 189.8 lb

## 2015-03-28 DIAGNOSIS — C9110 Chronic lymphocytic leukemia of B-cell type not having achieved remission: Secondary | ICD-10-CM

## 2015-03-28 DIAGNOSIS — C9112 Chronic lymphocytic leukemia of B-cell type in relapse: Secondary | ICD-10-CM

## 2015-03-28 DIAGNOSIS — D61818 Other pancytopenia: Secondary | ICD-10-CM

## 2015-03-28 DIAGNOSIS — Z95828 Presence of other vascular implants and grafts: Secondary | ICD-10-CM

## 2015-03-28 DIAGNOSIS — L98419 Non-pressure chronic ulcer of buttock with unspecified severity: Secondary | ICD-10-CM

## 2015-03-28 DIAGNOSIS — Z452 Encounter for adjustment and management of vascular access device: Secondary | ICD-10-CM

## 2015-03-28 DIAGNOSIS — C9192 Lymphoid leukemia, unspecified, in relapse: Secondary | ICD-10-CM

## 2015-03-28 LAB — TECHNOLOGIST REVIEW

## 2015-03-28 LAB — CBC WITH DIFFERENTIAL/PLATELET
BASO%: 0.5 % (ref 0.0–2.0)
BASOS ABS: 0 10*3/uL (ref 0.0–0.1)
EOS%: 0.8 % (ref 0.0–7.0)
Eosinophils Absolute: 0 10*3/uL (ref 0.0–0.5)
HEMATOCRIT: 27.2 % — AB (ref 34.8–46.6)
HEMOGLOBIN: 9.1 g/dL — AB (ref 11.6–15.9)
LYMPH#: 5.5 10*3/uL — AB (ref 0.9–3.3)
LYMPH%: 95.2 % — ABNORMAL HIGH (ref 14.0–49.7)
MCH: 33.5 pg (ref 25.1–34.0)
MCHC: 33.4 g/dL (ref 31.5–36.0)
MCV: 100.4 fL (ref 79.5–101.0)
MONO#: 0 10*3/uL — AB (ref 0.1–0.9)
MONO%: 0.5 % (ref 0.0–14.0)
NEUT%: 3 % — AB (ref 38.4–76.8)
NEUTROS ABS: 0.2 10*3/uL — AB (ref 1.5–6.5)
Platelets: 81 10*3/uL — ABNORMAL LOW (ref 145–400)
RBC: 2.71 10*6/uL — ABNORMAL LOW (ref 3.70–5.45)
RDW: 25.6 % — AB (ref 11.2–14.5)
WBC: 5.8 10*3/uL (ref 3.9–10.3)

## 2015-03-28 LAB — HOLD TUBE, BLOOD BANK

## 2015-03-28 MED ORDER — ATENOLOL 50 MG PO TABS: 50.0000 mg | ORAL_TABLET | Freq: Every day | ORAL | Status: AC

## 2015-03-28 MED ORDER — PROCHLORPERAZINE MALEATE 10 MG PO TABS: 10.0000 mg | ORAL_TABLET | Freq: Four times a day (QID) | ORAL | Status: DC | PRN

## 2015-03-28 MED ORDER — SODIUM CHLORIDE 0.9 % IJ SOLN
10.0000 mL | INTRAMUSCULAR | Status: DC | PRN
  Administered 2015-03-28: 10 mL via INTRAVENOUS
  Filled 2015-03-28: qty 10

## 2015-03-28 MED ORDER — POLYETHYLENE GLYCOL 3350 17 G PO PACK: 17.0000 g | PACK | Freq: Every day | ORAL | Status: AC | PRN

## 2015-03-28 MED ORDER — PANTOPRAZOLE SODIUM 40 MG PO TBEC: 40.0000 mg | DELAYED_RELEASE_TABLET | Freq: Every day | ORAL | Status: AC

## 2015-03-28 MED ORDER — HEPARIN SOD (PORK) LOCK FLUSH 100 UNIT/ML IV SOLN
500.0000 [IU] | Freq: Once | INTRAVENOUS | Status: DC
  Filled 2015-03-28: qty 5

## 2015-03-28 MED ORDER — POTASSIUM CHLORIDE CRYS ER 20 MEQ PO TBCR: 20.0000 meq | EXTENDED_RELEASE_TABLET | Freq: Every day | ORAL | Status: DC | PRN

## 2015-03-28 MED ORDER — OXYCODONE-ACETAMINOPHEN 7.5-325 MG PO TABS: 1.0000 | ORAL_TABLET | Freq: Four times a day (QID) | ORAL | Status: DC | PRN

## 2015-03-28 NOTE — Patient Instructions (Signed)

## 2015-03-28 NOTE — Patient Instructions (Signed)
It was good to see you today.  We have reviewed your prior records including labs and tests today   Medications reviewed and updated, no changes recommended at this time. Ok to use lidocaine for pain as needed  Please schedule followup in 3-4 months, call sooner if problems.

## 2015-03-28 NOTE — Progress Notes (Signed)
Pre visit review using our clinic review tool, if applicable. No additional management support is needed unless otherwise documented below in the visit note. 

## 2015-03-28 NOTE — Progress Notes (Signed)
Pt daughter was instructed to bring patient back to flush room or infusion for port to be heparinized and deaccessed if lab values normal today.Daughter verbalized understanding. Lab values abnormal, notified  Ned Card, NP nurse Wilfred Curtis and she stated  it was ok for patient to be discharged as long as she do not have fever or SOB. Vital signs 98.5, 130/70, 73, 18-100 02 SAT's RA. Pt stated she was feeling  weak and fatigue but no SOB.

## 2015-03-28 NOTE — Assessment & Plan Note (Signed)
Right gluteal fold and inguinal fold Management weekly at wound care clinic reviewed Continue home health wound care services Reassured safety of using lidocaine for pain management as needed when oxycodone felt inappropriate or ineffective Healing complicated by ongoing malignancy and various chemotherapies

## 2015-03-28 NOTE — Assessment & Plan Note (Signed)
Relapse with severe pancytopenia 08/2014- Interval reviewed including repeat bone marrow biopsy at [facility] June 27 Phone note copied from care everywhere here: Telephone Encounter - Lissa Merlin, Fidel Levy, MD - 03/27/2015 2:21 PM EDT Called [name] and discussed bone marrow biopsy results. Bone marrow was slightly hypercellular with 30-40% overall cellularity but 85% lymphocytes. Background hematopoiesis was markedly decreased but some megakaryocytes were small in size and clustering. Overall consistent with cytopenias due to too much CLL in the marrow. Reviewed her treatment history with [facility]; she has received FCR, alemtuzumab, ofatumumab, ibrutinib, obinutuzumab and chlorambucil, and most recently idelalisib and rituximab. For most all of these treatments she has been unable to receive standard dosing for significant periods of time due to frequent hospitalizations or development of side effects that warranted drug discontinuation or inability to dose escalate. The main reason to treat her again would be to improve her neutropenia to ultimately improve healing of her gluteal wound. Brad did try GCSF x 10 days during a recent hospitalization but this did not improve her neutropenia at all. 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 Dr. Benay Spice was okay with me contacting the patient's daughter with this information. I did so and reviewed all of the above extensively with her. They are due to see Brad in clinic again later this week and she will discuss these findings with her mother, other family members, and Dr. Benay Spice. Daughter voiced that she and her mother were very thankful for the care they have received here. ---- Support provided to patient and daughter on same Will work with home health to avoid interruption of ongoing care  Follow-up with oncology as planned

## 2015-03-28 NOTE — Progress Notes (Signed)
Subjective:    Patient ID: [name], female    DOB: October 08, 1933, 81 y.o.   MRN: [id number]  HPI  Patient here for followup - reviewed chronic medical issues, interval events, and current concerns  Past Medical History  Diagnosis Date  . 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 Stroke 05/2009    05/2009  Right upper extrem/leg weakness; nuchal pain;  . Benign essential HTN   . Macrocytic anemia   . Vertigo     ?TIA - MRI brain 5/13  No new CVA  . Pleurisy     01/16/12 pleuritic right chest pain  V/Q scan [place] [facility] low probability PE  . Breast cancer left T2N0 S/P mastectomy and SLN Bx 09/2010 2009  . CLL (chronic lymphoid leukemia) in relapse   . Arthritis   . Hyperlipidemia   . Thrombocytopenia, unspecified 06/15/2013    Review of Systems  Constitutional: Positive for fatigue.  Respiratory: Negative for cough and shortness of breath.   Cardiovascular: Negative for chest pain and palpitations.  Skin: Positive for wound.       Objective:    Physical Exam  Constitutional: She appears well-developed and well-nourished. No distress.  dtr at side  Cardiovascular: Normal rate, regular rhythm and normal heart sounds.   No murmur heard. Pulmonary/Chest: Effort normal and breath sounds normal. No respiratory distress.  Musculoskeletal: She exhibits no edema.  Skin:  Not examined by me today  Psychiatric: Her behavior is normal. Judgment and thought content normal.    BP 118/62 mmHg  Pulse 75  Temp(Src) 98.4 F (36.9 C) (Oral)  Ht 5' 3"  (1.6 m)  Wt 189 lb 12 oz (86.07 kg)  BMI 33.62 kg/m2  SpO2 98% Wt Readings from Last 3 Encounters:  03/28/15 189 lb 12 oz (86.07 kg)  03/13/15 195 lb (88.451 kg)  02/23/15 209 lb 14.4 oz (95.21 kg)    Lab Results  Component Value Date   WBC 5.2 03/21/2015   HGB 7.2* 03/21/2015   HCT 21.6* 03/21/2015   PLT  82* 03/21/2015   GLUCOSE 120* 03/21/2015   CHOL 221* 02/27/2014   TRIG 178.0* 02/27/2014   HDL 65.20 02/27/2014   LDLDIRECT 179.3 02/01/2013   LDLCALC 120* 02/27/2014   ALT 43 03/21/2015   AST 46* 03/21/2015   NA 134* 03/21/2015   K 3.3* 03/21/2015   CL 101 03/21/2015   CREATININE 1.26* 03/21/2015   BUN 23* 03/21/2015   CO2 23 03/21/2015   TSH 1.95 02/27/2014   INR 1.14 09/06/2014   HGBA1C 5.3 02/27/2014    No results found.     Assessment & Plan:   Problem List Items Addressed This Visit    CLL (chronic lymphocytic leukemia)    Relapse with severe pancytopenia 08/2014- Interval reviewed including repeat bone marrow biopsy at [facility] June 27 Phone note copied from care everywhere here: Telephone Encounter - [name], Olivia Levy, MD - 03/27/2015 2:21 PM EDT Called [name] and discussed bone marrow biopsy results. Bone marrow was slightly hypercellular with 30-40% overall cellularity but 85% lymphocytes. Background hematopoiesis was markedly decreased but some megakaryocytes were small in size and clustering. Overall consistent with cytopenias due to too much CLL in the marrow. Reviewed her treatment history with [name]; she has received FCR, alemtuzumab, ofatumumab, ibrutinib, obinutuzumab and chlorambucil, and most recently idelalisib and rituximab. For most all of these  treatments she has been unable to receive standard dosing for significant periods of time due to frequent hospitalizations or development of side effects that warranted drug discontinuation or inability to dose escalate. The main reason to treat her again would be to improve her neutropenia to ultimately improve healing of her gluteal wound. [name] did try GCSF x 10 days during a recent hospitalization but this did not improve her neutropenia at all. 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 Dr.  Benay [name] was okay with me contacting the patient's daughter with this information. I did so and reviewed all of the above extensively with her. They are due to see [name] in clinic again later this week and she will discuss these findings with her mother, other family members, and Dr. Benay [name]. Daughter voiced that she and her mother were very thankful for the care they have received here. ---- Support provided to patient and daughter on same Will work with home health to avoid interruption of ongoing care  Follow-up with oncology as planned      Relevant Medications   oxyCODONE-acetaminophen (PERCOCET) 7.5-325 MG per tablet   Other Relevant Orders   Ambulatory referral to [place]   Skin ulcer of multiple sites of buttock - Primary    Right gluteal fold and inguinal fold Management weekly at wound care clinic reviewed Continue home health wound care services Reassured safety of using lidocaine for pain management as needed when oxycodone felt inappropriate or ineffective Healing complicated by ongoing malignancy and various chemotherapies      Relevant Orders   Ambulatory referral to [place]     Time spent with pt/family today 40 minutes, greater than 50% time spent counseling patient on recurrent CLL, interval history, wound care issues and medication review. Also review of prior records   Gwendolyn Grant, MD

## 2015-03-30 ENCOUNTER — Telehealth: Payer: Self-pay | Admitting: Internal Medicine

## 2015-03-30 NOTE — Telephone Encounter (Signed)
Pt dtr informed that [name] will continue and that there would be no interruption in services provided.

## 2015-03-30 NOTE — Telephone Encounter (Signed)
Patient was in on 6/29 and she asked Dr. Asa Lente to extend her home health care nurse and they haven't heard anything yet.  Please call her daughter Ennis Forts at [phone]

## 2015-04-03 ENCOUNTER — Encounter (HOSPITAL_COMMUNITY): Payer: Self-pay

## 2015-04-03 ENCOUNTER — Inpatient Hospital Stay (HOSPITAL_COMMUNITY)
Admission: EM | Admit: 2015-04-03 | Discharge: 2015-04-08 | DRG: 872 | Disposition: A | Discharge status: Skilled Nursing Facility | Payer: Medicare Other | Attending: Internal Medicine | Admitting: Internal Medicine

## 2015-04-03 ENCOUNTER — Other Ambulatory Visit: Payer: Self-pay

## 2015-04-03 ENCOUNTER — Telehealth: Payer: Self-pay | Admitting: Internal Medicine

## 2015-04-03 ENCOUNTER — Emergency Department (HOSPITAL_COMMUNITY): Payer: Medicare Other

## 2015-04-03 ENCOUNTER — Telehealth: Payer: Self-pay | Admitting: *Deleted

## 2015-04-03 ENCOUNTER — Encounter (HOSPITAL_BASED_OUTPATIENT_CLINIC_OR_DEPARTMENT_OTHER): Payer: Medicare Other | Attending: General Surgery

## 2015-04-03 ENCOUNTER — Other Ambulatory Visit (HOSPITAL_COMMUNITY): Payer: Self-pay

## 2015-04-03 DIAGNOSIS — D61818 Other pancytopenia: Secondary | ICD-10-CM | POA: Diagnosis present

## 2015-04-03 DIAGNOSIS — R5081 Fever presenting with conditions classified elsewhere: Secondary | ICD-10-CM | POA: Diagnosis present

## 2015-04-03 DIAGNOSIS — C9110 Chronic lymphocytic leukemia of B-cell type not having achieved remission: Secondary | ICD-10-CM

## 2015-04-03 DIAGNOSIS — F0390 Unspecified dementia without behavioral disturbance: Secondary | ICD-10-CM | POA: Diagnosis present

## 2015-04-03 DIAGNOSIS — R531 Weakness: Secondary | ICD-10-CM | POA: Diagnosis not present

## 2015-04-03 DIAGNOSIS — I129 Hypertensive chronic kidney disease with stage 1 through stage 4 chronic kidney disease, or unspecified chronic kidney disease: Secondary | ICD-10-CM | POA: Diagnosis present

## 2015-04-03 DIAGNOSIS — N179 Acute kidney failure, unspecified: Secondary | ICD-10-CM | POA: Diagnosis present

## 2015-04-03 DIAGNOSIS — R197 Diarrhea, unspecified: Secondary | ICD-10-CM | POA: Diagnosis present

## 2015-04-03 DIAGNOSIS — Z880 Allergy status to penicillin: Secondary | ICD-10-CM

## 2015-04-03 DIAGNOSIS — Z66 Do not resuscitate: Secondary | ICD-10-CM | POA: Diagnosis present

## 2015-04-03 DIAGNOSIS — Z91040 Latex allergy status: Secondary | ICD-10-CM

## 2015-04-03 DIAGNOSIS — L98499 Non-pressure chronic ulcer of skin of other sites with unspecified severity: Secondary | ICD-10-CM | POA: Diagnosis present

## 2015-04-03 DIAGNOSIS — C50912 Malignant neoplasm of unspecified site of left female breast: Secondary | ICD-10-CM | POA: Diagnosis present

## 2015-04-03 DIAGNOSIS — B965 Pseudomonas (aeruginosa) (mallei) (pseudomallei) as the cause of diseases classified elsewhere: Secondary | ICD-10-CM | POA: Diagnosis present

## 2015-04-03 DIAGNOSIS — T451X5A Adverse effect of antineoplastic and immunosuppressive drugs, initial encounter: Secondary | ICD-10-CM | POA: Diagnosis present

## 2015-04-03 DIAGNOSIS — E860 Dehydration: Secondary | ICD-10-CM | POA: Diagnosis present

## 2015-04-03 DIAGNOSIS — D638 Anemia in other chronic diseases classified elsewhere: Secondary | ICD-10-CM | POA: Diagnosis present

## 2015-04-03 DIAGNOSIS — N189 Chronic kidney disease, unspecified: Secondary | ICD-10-CM | POA: Diagnosis present

## 2015-04-03 DIAGNOSIS — Z9012 Acquired absence of left breast and nipple: Secondary | ICD-10-CM | POA: Diagnosis present

## 2015-04-03 DIAGNOSIS — R630 Anorexia: Secondary | ICD-10-CM | POA: Diagnosis present

## 2015-04-03 DIAGNOSIS — Z79899 Other long term (current) drug therapy: Secondary | ICD-10-CM | POA: Diagnosis not present

## 2015-04-03 DIAGNOSIS — E785 Hyperlipidemia, unspecified: Secondary | ICD-10-CM | POA: Diagnosis not present

## 2015-04-03 DIAGNOSIS — C9112 Chronic lymphocytic leukemia of B-cell type in relapse: Secondary | ICD-10-CM | POA: Diagnosis present

## 2015-04-03 DIAGNOSIS — Z8673 Personal history of transient ischemic attack (TIA), and cerebral infarction without residual deficits: Secondary | ICD-10-CM

## 2015-04-03 DIAGNOSIS — A419 Sepsis, unspecified organism: Secondary | ICD-10-CM | POA: Diagnosis present

## 2015-04-03 DIAGNOSIS — L8942 Pressure ulcer of contiguous site of back, buttock and hip, stage 2: Secondary | ICD-10-CM | POA: Diagnosis present

## 2015-04-03 DIAGNOSIS — I10 Essential (primary) hypertension: Secondary | ICD-10-CM | POA: Insufficient documentation

## 2015-04-03 DIAGNOSIS — R000 Tachycardia, unspecified: Secondary | ICD-10-CM | POA: Diagnosis present

## 2015-04-03 DIAGNOSIS — I471 Supraventricular tachycardia, unspecified: Secondary | ICD-10-CM | POA: Diagnosis present

## 2015-04-03 DIAGNOSIS — B9689 Other specified bacterial agents as the cause of diseases classified elsewhere: Secondary | ICD-10-CM | POA: Diagnosis present

## 2015-04-03 DIAGNOSIS — Z90710 Acquired absence of both cervix and uterus: Secondary | ICD-10-CM | POA: Diagnosis not present

## 2015-04-03 DIAGNOSIS — M1990 Unspecified osteoarthritis, unspecified site: Secondary | ICD-10-CM | POA: Insufficient documentation

## 2015-04-03 DIAGNOSIS — C50919 Malignant neoplasm of unspecified site of unspecified female breast: Secondary | ICD-10-CM | POA: Diagnosis present

## 2015-04-03 DIAGNOSIS — Z9221 Personal history of antineoplastic chemotherapy: Secondary | ICD-10-CM | POA: Insufficient documentation

## 2015-04-03 DIAGNOSIS — Z888 Allergy status to other drugs, medicaments and biological substances status: Secondary | ICD-10-CM

## 2015-04-03 DIAGNOSIS — D649 Anemia, unspecified: Secondary | ICD-10-CM | POA: Diagnosis not present

## 2015-04-03 DIAGNOSIS — I5032 Chronic diastolic (congestive) heart failure: Secondary | ICD-10-CM | POA: Diagnosis present

## 2015-04-03 DIAGNOSIS — I479 Paroxysmal tachycardia, unspecified: Secondary | ICD-10-CM

## 2015-04-03 HISTORY — DX: Personal history of other medical treatment: Z92.89

## 2015-04-03 HISTORY — DX: Anxiety disorder, unspecified: F41.9

## 2015-04-03 HISTORY — DX: Major depressive disorder, single episode, unspecified: F32.9

## 2015-04-03 HISTORY — DX: Depression, unspecified: F32.A

## 2015-04-03 HISTORY — DX: Gastro-esophageal reflux disease without esophagitis: K21.9

## 2015-04-03 LAB — CBC
HEMATOCRIT: 24.2 % — AB (ref 36.0–46.0)
Hemoglobin: 8 g/dL — ABNORMAL LOW (ref 12.0–15.0)
MCH: 32.5 pg (ref 26.0–34.0)
MCHC: 33.1 g/dL (ref 30.0–36.0)
MCV: 98.4 fL (ref 78.0–100.0)
Platelets: 73 10*3/uL — ABNORMAL LOW (ref 150–400)
RBC: 2.46 MIL/uL — ABNORMAL LOW (ref 3.87–5.11)
WBC: 6.7 10*3/uL (ref 4.0–10.5)

## 2015-04-03 LAB — BASIC METABOLIC PANEL
Anion gap: 14 (ref 5–15)
BUN: 27 mg/dL — AB (ref 6–20)
CHLORIDE: 96 mmol/L — AB (ref 101–111)
CO2: 20 mmol/L — ABNORMAL LOW (ref 22–32)
Calcium: 8.3 mg/dL — ABNORMAL LOW (ref 8.9–10.3)
Creatinine, Ser: 1.53 mg/dL — ABNORMAL HIGH (ref 0.44–1.00)
GFR calc Af Amer: 36 mL/min — ABNORMAL LOW (ref >60)
GFR, EST NON AFRICAN AMERICAN: 31 mL/min — AB (ref >60)
GLUCOSE: 168 mg/dL — AB (ref 65–99)
POTASSIUM: 3.3 mmol/L — AB (ref 3.5–5.1)
Sodium: 130 mmol/L — ABNORMAL LOW (ref 135–145)

## 2015-04-03 LAB — I-STAT TROPONIN, ED: Troponin i, poc: 0.06 ng/mL (ref 0.00–0.08)

## 2015-04-03 MED ORDER — ACYCLOVIR 400 MG PO TABS
400.0000 mg | ORAL_TABLET | Freq: Every day | ORAL | Status: DC
  Administered 2015-04-03 – 2015-04-06 (×4): 400 mg via ORAL
  Filled 2015-04-03 (×4): qty 1

## 2015-04-03 MED ORDER — DONEPEZIL HCL 10 MG PO TABS
10.0000 mg | ORAL_TABLET | Freq: Every day | ORAL | Status: DC
  Administered 2015-04-03 – 2015-04-07 (×5): 10 mg via ORAL
  Filled 2015-04-03 (×6): qty 1

## 2015-04-03 MED ORDER — HYDROCODONE-ACETAMINOPHEN 5-325 MG PO TABS
1.0000 | ORAL_TABLET | ORAL | Status: DC | PRN
  Filled 2015-04-03: qty 1

## 2015-04-03 MED ORDER — SODIUM CHLORIDE 0.9 % IV SOLN
INTRAVENOUS | Status: AC
  Administered 2015-04-03 (×2): via INTRAVENOUS

## 2015-04-03 MED ORDER — ONDANSETRON HCL 4 MG PO TABS: 4.0000 mg | ORAL_TABLET | Freq: Four times a day (QID) | ORAL | Status: DC | PRN

## 2015-04-03 MED ORDER — SODIUM CHLORIDE 0.9 % IV BOLUS (SEPSIS)
1000.0000 mL | Freq: Once | INTRAVENOUS | Status: AC
  Administered 2015-04-03: 1000 mL via INTRAVENOUS

## 2015-04-03 MED ORDER — POTASSIUM CHLORIDE CRYS ER 20 MEQ PO TBCR
40.0000 meq | EXTENDED_RELEASE_TABLET | ORAL | Status: AC
  Administered 2015-04-03 (×2): 40 meq via ORAL
  Filled 2015-04-03 (×2): qty 2

## 2015-04-03 MED ORDER — ATENOLOL 50 MG PO TABS
50.0000 mg | ORAL_TABLET | Freq: Every day | ORAL | Status: DC
  Administered 2015-04-03 – 2015-04-07 (×5): 50 mg via ORAL
  Filled 2015-04-03 (×6): qty 1

## 2015-04-03 MED ORDER — SODIUM CHLORIDE 0.9 % IV BOLUS (SEPSIS): 1000.0000 mL | INTRAVENOUS | Status: DC | PRN

## 2015-04-03 MED ORDER — SODIUM CHLORIDE 0.9 % IV SOLN: INTRAVENOUS | Status: DC

## 2015-04-03 MED ORDER — VANCOMYCIN 50 MG/ML ORAL SOLUTION: 250.0000 mg | Freq: Four times a day (QID) | ORAL | Status: DC

## 2015-04-03 MED ORDER — ENSURE ENLIVE PO LIQD
237.0000 mL | Freq: Two times a day (BID) | ORAL | Status: DC
  Administered 2015-04-04 – 2015-04-08 (×8): 237 mL via ORAL

## 2015-04-03 MED ORDER — VANCOMYCIN 50 MG/ML ORAL SOLUTION
250.0000 mg | Freq: Four times a day (QID) | ORAL | Status: DC
  Administered 2015-04-03 (×2): 250 mg via ORAL
  Filled 2015-04-03 (×6): qty 5

## 2015-04-03 MED ORDER — GUAIFENESIN-DM 100-10 MG/5ML PO SYRP
5.0000 mL | ORAL_SOLUTION | ORAL | Status: DC | PRN
Start: 2015-04-03 — End: 2015-04-08

## 2015-04-03 MED ORDER — ONDANSETRON HCL 4 MG/2ML IJ SOLN: 4.0000 mg | Freq: Four times a day (QID) | INTRAMUSCULAR | Status: DC | PRN

## 2015-04-03 MED ORDER — SODIUM CHLORIDE 0.9 % IJ SOLN
3.0000 mL | Freq: Two times a day (BID) | INTRAMUSCULAR | Status: DC
  Administered 2015-04-06 – 2015-04-08 (×3): 3 mL via INTRAVENOUS

## 2015-04-03 MED ORDER — SODIUM CHLORIDE 0.9 % IV SOLN
INTRAVENOUS | Status: AC
  Administered 2015-04-04: 10:00:00 via INTRAVENOUS

## 2015-04-03 NOTE — H&P (Signed)
Patient Demographics:    [name], is a 81 y.o. female  MRN: [id number]   DOB - 10-04-1933  Admit Date - 04/03/2015  Outpatient Primary MD for the patient is Gwendolyn Grant, MD   With History of -  Past Medical History  Diagnosis Date  . 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 Stroke 05/2009    05/2009  Right upper extrem/leg weakness; nuchal pain;  . Benign essential HTN   . Macrocytic anemia   . Vertigo     ?TIA - MRI brain 5/13  No new CVA  . Pleurisy     01/16/12 pleuritic right chest pain  V/Q scan [place] [facility] low probability PE  . Breast cancer left T2N0 S/P mastectomy and SLN Bx 09/2010 2009  . CLL (chronic lymphoid leukemia) in relapse   . Arthritis   . Hyperlipidemia   . Thrombocytopenia, unspecified 06/15/2013      Past Surgical History  Procedure Laterality Date  . Tonsillectomy    . Abdominal hysterectomy    . Right parotid gland    . Hemorrhoid surgery    . Breast biopsy Left 2009  . Mastectomy  02/ 2012    left breast  . Porta-cath      right chest    in for   Chief Complaint  Patient presents with  . Tachycardia      HPI:    [name]  is a 81 y.o. female,  history of CLL undergoing chemotherapy , stroke with mild right-sided weakness, generalized weakness ambulates with a walker and lives with her daughter, breast cancer status post left mastectomy, essential hypertension, anemia of chronic disease, early dementia who's been having cancer/chemotherapy related skin ulceration since April, this has been followed by dermatology outpatient and biopsied. She has 2 stage II to 3 deep ulcers 1 midline in between her buttocks and second in her vulvovaginal area.  She's been going to a wound care clinic outpatient, she was recently placed on Cipro for ulcer treatment, for the last 3 days she developed a diarrhea, also she has some ulcerations in her oral mucosa. Her oral intake has been poor for the last 1-2 weeks.  She has been feeling dehydrated and weak, today while she was coming back from the wound care clinic she felt dizzy and weak, EMS was called, in the ambulance she had runs of SVT, patient herself did not feel any palpitations or chest discomfort, in the ER SVT was confirmed on telemetry strips. She also had clinical evidence of dehydration with acute renal failure, I was called to admit the patient. She is currently feeling better besides dull chronic ache in her ulcer sites she is symptom-free.    Review of systems:    In addition to the HPI above,  No Fever-chills, No Headache, No  changes with Vision or hearing, Does have swallowing issues secondary to oral ulceration and has had poor oral intake for the last few weeks, No Chest pain, Cough or Shortness of Breath, No Abdominal pain, No Nausea or Vommitting, Bowel movements are regular, No Blood in stool or Urine, No dysuria, No new skin rashes or bruises, except as dictated above dictated above No new joints pains-aches,  No new weakness, tingling, numbness in any extremity, positive generalized weakness No recent weight gain or loss, No polyuria, polydypsia or polyphagia, No significant Mental Stressors.  A full 10 point Review of Systems was done, except as stated above, all other Review of Systems were negative.    Social History:     History  Substance Use Topics  . Smoking status: Never Smoker   . Smokeless tobacco: Never Used  . Alcohol Use: No    Lives - with daughter  Mobility - walks minimally with a walker with assistance      Family History :     Family History  Problem Relation Age of Onset  . Breast cancer Mother   . Hyperlipidemia    . Stroke    .  Hypertension    . Arthritis         Home Medications:   Prior to Admission medications   Medication Sig Start Date End Date Taking? Authorizing Provider  acetaminophen (TYLENOL) 325 MG tablet Take 650 mg by mouth every 6 (six) hours as needed for moderate pain (pain).   Yes Historical Provider, MD  acyclovir (ZOVIRAX) 400 MG tablet Take 1 tablet (400 mg total) by mouth daily. 11/03/14  Yes Rowe Clack, MD  amLODipine (NORVASC) 10 MG tablet Take 1 tablet (10 mg total) by mouth daily. 07/07/13  Yes Domenic Polite, MD  atenolol (TENORMIN) 50 MG tablet Take 1 tablet (50 mg total) by mouth at bedtime. 03/28/15  Yes Rowe Clack, MD  ciprofloxacin (CIPRO) 250 MG tablet Take 1 tablet (250 mg total) by mouth every 12 (twelve) hours. 03/21/15  Yes Sherwood Gambler, MD  clotrimazole (LOTRIMIN) 1 % cream Apply topically 2 (two) times daily. To wound 02/16/15  Yes Shanker Kristeen Mans, MD  diphenhydrAMINE (BENADRYL) 25 MG tablet Take 25 mg by mouth every 6 (six) hours as needed for allergies (prevent allergic reaction).   Yes Historical Provider, MD  donepezil (ARICEPT) 10 MG tablet Take 10 mg by mouth at bedtime.  02/27/15  Yes Historical Provider, MD  hydrochlorothiazide (MICROZIDE) 12.5 MG capsule Take 1 capsule (12.5 mg total) by mouth daily. 09/04/14  Yes Rowe Clack, MD  meclizine (ANTIVERT) 12.5 MG tablet Take 1 tablet (12.5 mg total) by mouth 3 (three) times daily as needed for dizziness. 09/04/14  Yes Rowe Clack, MD  oxyCODONE-acetaminophen (PERCOCET) 7.5-325 MG per tablet Take 1-2 tablets by mouth every 6 (six) hours as needed for moderate pain. 03/28/15  Yes Rowe Clack, MD  pantoprazole (PROTONIX) 40 MG tablet Take 1 tablet (40 mg total) by mouth daily. 03/28/15  Yes Rowe Clack, MD  polyethylene glycol (MIRALAX / GLYCOLAX) packet Take 17 g by mouth daily as needed for mild constipation (constipation). 03/28/15  Yes Rowe Clack, MD  potassium chloride SA  (KLOR-CON M20) 20 MEQ tablet Take 1 tablet (20 mEq total) by mouth daily as needed (for cramps). 03/28/15  Yes Rowe Clack, MD  prochlorperazine (COMPAZINE) 10 MG tablet Take 1 tablet (10 mg total) by mouth every 6 (six) hours as needed for  nausea. 03/28/15  Yes Rowe Clack, MD  tamoxifen (NOLVADEX) 20 MG tablet TAKE 1 TABLET (20 MG TOTAL) BY MOUTH DAILY. 01/26/15  Yes Owens Shark, NP     Allergies:     Allergies  Allergen Reactions  . Obinutuzumab Shortness Of Breath and Rash  . Penicillins Anaphylaxis, Shortness Of Breath, Itching, Swelling and Rash    Swelling all over body and throat   . Latex Itching     Physical Exam:   Vitals  Blood pressure 119/41, pulse 77, temperature 99.4 F (37.4 C), temperature source Oral, resp. rate 29, height 5\' 3"  (1.6 m), weight 84.823 kg (187 lb), SpO2 99 %.   1. General frail AA elderly female lying in bed in NAD,   2. Normal affect and insight, Not Suicidal or Homicidal, Awake Alert, Oriented X 2.  3. No F.N deficits, ALL C.Nerves Intact, Strength 5/5 all 4 extremities, Sensation intact all 4 extremities, Plantars down going.  4. Ears and Eyes appear Normal, Conjunctivae clear, PERRLA. Moist Oral Mucosa.  5. Supple Neck, No JVD, No cervical lymphadenopathy appriciated, No Carotid Bruits.  6. Symmetrical Chest wall movement, Good air movement bilaterally, CTAB.  7. RRR, No Gallops, Rubs or Murmurs, No Parasternal Heave.  8. Positive Bowel Sounds, Abdomen Soft, No tenderness, No organomegaly appriciated,No rebound -guarding or rigidity.  9.  No Cyanosis, Normal Skin Turgor, No Skin Rash or Bruise. She has 2 large 2-3 cm ulcers 1 midline between her buttocks stage III, one in her vulvovaginal area about similar size. No puslike secretion.  10. Good muscle tone,  joints appear normal , no effusions, Normal ROM.  11. No Palpable Lymph Nodes in Neck or Axillae      Data Review:    CBC  Recent Labs Lab 03/28/15 1243  04/03/15 1523  WBC 5.8 6.7  HGB 9.1* 8.0*  HCT 27.2* 24.2*  PLT 81* 73*  MCV 100.4 98.4  MCH 33.5 32.5  MCHC 33.4 33.1  RDW 25.6* NOT CALCULATED  LYMPHSABS 5.5*  --   MONOABS 0.0*  --   EOSABS 0.0  --   BASOSABS 0.0  --    ------------------------------------------------------------------------------------------------------------------  Chemistries   Recent Labs Lab 04/03/15 1523  NA 130*  K 3.3*  CL 96*  CO2 20*  GLUCOSE 168*  BUN 27*  CREATININE 1.53*  CALCIUM 8.3*   ------------------------------------------------------------------------------------------------------------------ estimated creatinine clearance is 30.8 mL/min (by C-G formula based on Cr of 1.53). ------------------------------------------------------------------------------------------------------------------ No results for input(s): TSH, T4TOTAL, T3FREE, THYROIDAB in the last 72 hours.  Invalid input(s): FREET3   Coagulation profile No results for input(s): INR, PROTIME in the last 168 hours. ------------------------------------------------------------------------------------------------------------------- No results for input(s): DDIMER in the last 72 hours. -------------------------------------------------------------------------------------------------------------------  Cardiac Enzymes No results for input(s): CKMB, TROPONINI, MYOGLOBIN in the last 168 hours.  Invalid input(s): CK ------------------------------------------------------------------------------------------------------------------ Invalid input(s): POCBNP   ---------------------------------------------------------------------------------------------------------------  Urinalysis    Component Value Date/Time   COLORURINE AMBER* 03/21/2015 2156   APPEARANCEUR CLOUDY* 03/21/2015 2156   LABSPEC 1.015 03/21/2015 2156   LABSPEC 1.010 06/10/2013 1558   PHURINE 5.5 03/21/2015 2156   PHURINE 6.0 06/10/2013 1558   GLUCOSEU  NEGATIVE 03/21/2015 2156   GLUCOSEU Negative 06/10/2013 1558   HGBUR SMALL* 03/21/2015 2156   HGBUR Negative 06/10/2013 1558   BILIRUBINUR NEGATIVE 03/21/2015 2156   BILIRUBINUR Negative 06/10/2013 1558   BILIRUBINUR neg 02/01/2013 [place] 03/21/2015 2156   KETONESUR Negative 06/10/2013 1558   PROTEINUR NEGATIVE 03/21/2015 2156   PROTEINUR  Negative 06/10/2013 1558   PROTEINUR neg 02/01/2013 0943   UROBILINOGEN 1.0 03/21/2015 2156   UROBILINOGEN 0.2 06/10/2013 1558   UROBILINOGEN 0.2 02/01/2013 0943   NITRITE NEGATIVE 03/21/2015 2156   NITRITE Negative 06/10/2013 1558   NITRITE neg 02/01/2013 0943   LEUKOCYTESUR MODERATE* 03/21/2015 2156   LEUKOCYTESUR Negative 06/10/2013 1558    ----------------------------------------------------------------------------------------------------------------   Imaging Results:    Dg Chest Port 1 View  04/03/2015   CLINICAL DATA:  Wounds on the buttock area.  Evaluate for SVT.  EXAM: PORTABLE CHEST - 1 VIEW  COMPARISON:  09/04/2014  FINDINGS: Again noted is a right jugular Port-A-Cath. A catheter tip is more midline than expected but this may be related to patient positioning. Tip is likely in the lower SVC region. Atherosclerotic calcifications at the aortic arch. Heart size is within normal limits. There is a bandlike density overlying the left cardiac apex which probably represents overlying soft tissues. Otherwise, the lungs are clear. Degenerative changes in thoracic spine.  IMPRESSION: No acute chest findings.   Electronically Signed   By: Markus Daft M.D.   On: 04/03/2015 15:22    My personal review of EKG: Rhythm NSR,  no Acute ST changes. Tele strip from EMT SVT   Assessment & Plan:     1. SVT caused by severe dehydration. Likely due to diarrhea, rule out C. difficile, she clearly is dehydrated, IV fluid bolus followed by maintenance IV fluid, check C. difficile PCR, oral vancomycin.   2. SVT. Hydrate as above, oral. At her  blocker along with as needed IV Lopressor. Check TSH. Recent echo from a few months ago noted with a EF of 60% and chronic grade 1 diastolic CHF which is compensated.   3. CLL with pancytopenia. Outpatient follow-up with oncologist, no acute issues.   4. Grade 2-3 skin ulcers. Wound care consulted supportive care. No evidence of ongoing infection on exam.   5. Early dementia. Continue Aricept. At risk for delirium, family counseled. Minimize narcotic benzodiazepine use.   6. Essential hypertension. Low-dose beta blocker and monitor.   7. Dehydration with ARF. Hydrate, repeat BMP, hold diuretic speech. Avoid nephrotoxic.   8. Denies weakness. Hydrate, PT eval. Social work to evaluate for placement.     DVT Prophylaxis SCDs    AM Labs Ordered, also please review Full Orders  Family Communication: Admission, patients condition and plan of care including tests being ordered have been discussed with the patient and daughter who indicate understanding and agree with the plan and Code Status.  Code Status DNR  Likely DC to  SNF  Condition GUARDED    Time spent in minutes : 35    SINGH,PRASHANT K M.D on 04/03/2015 at 6:47 PM  Between 7am to 7pm - Pager - [phone]  After 7pm go to [email or web address] - password [facility]  [facility]  Office  [phone]

## 2015-04-03 NOTE — ED Notes (Signed)
Pt given food and drink, pt informed of need of urine

## 2015-04-03 NOTE — Telephone Encounter (Signed)
LVM for pt to call back as soon as possible (pt dtr) with the information below.

## 2015-04-03 NOTE — Telephone Encounter (Signed)
Due to current limitations with skilled nursing facility admission from home, I recommend evaluation in the hospital with discharge to skilled facility to arrange appropriate care as requested. Given falls, risk to health and medical decline with wound and pain management, emergency room evaluation is appropriate for this matter thanks

## 2015-04-03 NOTE — ED Provider Notes (Signed)
CSN: [id number]     Arrival date & time 04/03/15  1444 History   First MD Initiated Contact with Patient 04/03/15 1507     Chief Complaint  Patient presents with  . Tachycardia     (Consider location/radiation/quality/duration/timing/severity/associated sxs/prior Treatment) HPI   [name] is a 81 y.o. female who presents for evaluation of weakness, diaphoresis and shortness of breath which started as she was transferring to her home from a car. She was too weak to continue so EMS was summoned. She was found to be in supraventricular tachycardia rate of 180 by the initial providers, and had 2 rather short runs of SVT after arrival of EMS. Apparently all episodes resolved with vagal maneuvers or spontaneously. Patient has ongoing trouble eating, swallowing, and anorexia for several weeks. Her last chemotherapy was one month ago. She had a bone marrow biopsy last week. She is being treated for dermal wounds that are associated with her chemotherapy. She is at the wound center earlier today. She has dysuria but no hematuria. There is no known fever. She has not had any vomiting. No cough, chest pain, no abdominal pain. There are no other known modifying factors.   Past Medical History  Diagnosis Date  . 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 Stroke 05/2009    05/2009  Right upper extrem/leg weakness; nuchal pain;  . Benign essential HTN   . Macrocytic anemia   . Vertigo     ?TIA - MRI brain 5/13  No new CVA  . Pleurisy     01/16/12 pleuritic right chest pain  V/Q scan [place] [facility] low probability PE  . Breast cancer left T2N0 S/P mastectomy and SLN Bx 09/2010 2009  . CLL (chronic lymphoid leukemia) in relapse   . Arthritis   . Hyperlipidemia   . Thrombocytopenia, unspecified 06/15/2013   Past Surgical History  Procedure Laterality Date  . Tonsillectomy    .  Abdominal hysterectomy    . Right parotid gland    . Hemorrhoid surgery    . Breast biopsy Left 2009  . Mastectomy  02/ 2012    left breast  . Porta-cath      right chest   Family History  Problem Relation Age of Onset  . Breast cancer Mother   . Hyperlipidemia    . Stroke    . Hypertension    . Arthritis     History  Substance Use Topics  . Smoking status: Never Smoker   . Smokeless tobacco: Never Used  . Alcohol Use: No   OB History    No data available     Review of Systems  All other systems reviewed and are negative.     Allergies  Obinutuzumab; Penicillins; and Latex  Home Medications   Prior to Admission medications   Medication Sig Start Date End Date Taking? Authorizing Provider  acetaminophen (TYLENOL) 325 MG tablet Take 650 mg by mouth every 6 (six) hours as needed for moderate pain (pain).   Yes Historical Provider, MD  acyclovir (ZOVIRAX) 400 MG tablet Take 1 tablet (400 mg total) by mouth daily. 11/03/14  Yes Rowe Clack, MD  amLODipine (NORVASC) 10 MG tablet Take 1 tablet (10 mg total) by mouth daily. 07/07/13  Yes Domenic Polite, MD  atenolol (TENORMIN) 50 MG tablet Take 1 tablet (50 mg total) by mouth at bedtime. 03/28/15  Yes  Rowe Clack, MD  ciprofloxacin (CIPRO) 250 MG tablet Take 1 tablet (250 mg total) by mouth every 12 (twelve) hours. 03/21/15  Yes Sherwood Gambler, MD  clotrimazole (LOTRIMIN) 1 % cream Apply topically 2 (two) times daily. To wound 02/16/15  Yes Shanker Kristeen Mans, MD  diphenhydrAMINE (BENADRYL) 25 MG tablet Take 25 mg by mouth every 6 (six) hours as needed for allergies (prevent allergic reaction).   Yes Historical Provider, MD  donepezil (ARICEPT) 10 MG tablet Take 10 mg by mouth at bedtime.  02/27/15  Yes Historical Provider, MD  hydrochlorothiazide (MICROZIDE) 12.5 MG capsule Take 1 capsule (12.5 mg total) by mouth daily. 09/04/14  Yes Rowe Clack, MD  meclizine (ANTIVERT) 12.5 MG tablet Take 1 tablet (12.5 mg  total) by mouth 3 (three) times daily as needed for dizziness. 09/04/14  Yes Rowe Clack, MD  oxyCODONE-acetaminophen (PERCOCET) 7.5-325 MG per tablet Take 1-2 tablets by mouth every 6 (six) hours as needed for moderate pain. 03/28/15  Yes Rowe Clack, MD  pantoprazole (PROTONIX) 40 MG tablet Take 1 tablet (40 mg total) by mouth daily. 03/28/15  Yes Rowe Clack, MD  polyethylene glycol (MIRALAX / GLYCOLAX) packet Take 17 g by mouth daily as needed for mild constipation (constipation). 03/28/15  Yes Rowe Clack, MD  potassium chloride SA (KLOR-CON M20) 20 MEQ tablet Take 1 tablet (20 mEq total) by mouth daily as needed (for cramps). 03/28/15  Yes Rowe Clack, MD  prochlorperazine (COMPAZINE) 10 MG tablet Take 1 tablet (10 mg total) by mouth every 6 (six) hours as needed for nausea. 03/28/15  Yes Rowe Clack, MD  tamoxifen (NOLVADEX) 20 MG tablet TAKE 1 TABLET (20 MG TOTAL) BY MOUTH DAILY. 01/26/15  Yes Owens Shark, NP   BP 119/41 mmHg  Pulse 77  Temp(Src) 99.4 F (37.4 C) (Oral)  Resp 29  Ht 5' 3"  (1.6 m)  Wt 187 lb (84.823 kg)  BMI 33.13 kg/m2  SpO2 99% Physical Exam  Constitutional: She is oriented to person, place, and time. She appears well-developed. No distress.  Elderly, frail  HENT:  Head: Normocephalic and atraumatic.  Right Ear: External ear normal.  Left Ear: External ear normal.  Mucous membranes somewhat dry. No oral or labial lesions.  Eyes: Conjunctivae and EOM are normal. Pupils are equal, round, and reactive to light.  Neck: Normal range of motion and phonation normal. Neck supple.  Cardiovascular: Regular rhythm and normal heart sounds.   Borderline tachycardia. No cardiac murmur.  Pulmonary/Chest: Effort normal and breath sounds normal. She exhibits no bony tenderness.  Abdominal: Soft. There is no tenderness.  Musculoskeletal: Normal range of motion. She exhibits edema (2+ bilateral lower legs). She exhibits no tenderness.   Neurological: She is alert and oriented to person, place, and time. No cranial nerve deficit or sensory deficit. She exhibits normal muscle tone. Coordination normal.  Skin: Skin is warm, dry and intact.  Psychiatric: She has a normal mood and affect. Her behavior is normal. Judgment and thought content normal.  Nursing note and vitals reviewed.   ED Course  Procedures (including critical care time) Medications  0.9 %  sodium chloride infusion (not administered)  potassium chloride SA (K-DUR,KLOR-CON) CR tablet 40 mEq (not administered)  sodium chloride 0.9 % bolus 1,000 mL (0 mLs Intravenous Stopped 04/03/15 1747)    Patient Vitals for the past 24 hrs:  BP Temp Temp src Pulse Resp SpO2 Height Weight  04/03/15 1745 (!) 119/41 mmHg - -  77 (!) 29 99 % - -  04/03/15 1715 (!) 126/41 mmHg - - 80 (!) 28 100 % - -  04/03/15 1645 146/57 mmHg - - 87 (!) 33 98 % - -  04/03/15 1617 (!) 129/40 mmHg - - 64 (!) 31 99 % - -  04/03/15 1515 127/87 mmHg - - 86 18 100 % - -  04/03/15 1502 129/62 mmHg 99.4 F (37.4 C) Oral 100 (!) 28 99 % 5' 3"  (1.6 m) 187 lb (84.823 kg)  04/03/15 1450 - - - - - 100 % - -    5:53 PM Reevaluation with update and discussion. After initial assessment and treatment, an updated evaluation reveals patient remains alert, has been trying to eat but only able tolerate a little bit of Jell-O. She has no additional complaints. Patient has not had any episodes of tachycardia while observed for 3 hours on the cardiac monitor in the emergency department. Findings discussed with patient and family members, all questions answered. Jhoel Stieg L   5:57 PM-Consult complete with Dr. Candiss Norse. Patient case explained and discussed. He agrees to admit patient for further evaluation and treatment. Call ended at 18:05  Labs Review Labs Reviewed  CBC - Abnormal; Notable for the following:    RBC 2.46 (*)    Hemoglobin 8.0 (*)    HCT 24.2 (*)    Platelets 73 (*)    All other components within  normal limits  BASIC METABOLIC PANEL - Abnormal; Notable for the following:    Sodium 130 (*)    Potassium 3.3 (*)    Chloride 96 (*)    CO2 20 (*)    Glucose, Bld 168 (*)    BUN 27 (*)    Creatinine, Ser 1.53 (*)    Calcium 8.3 (*)    GFR calc non Af Amer 31 (*)    GFR calc Af Amer 36 (*)    All other components within normal limits  URINE CULTURE  URINE CULTURE  URINALYSIS, ROUTINE W REFLEX MICROSCOPIC (NOT AT [facility])  URINALYSIS, ROUTINE W REFLEX MICROSCOPIC (NOT AT [facility])  TSH  I-STAT TROPOININ, ED    Imaging Review Dg Chest Port 1 View  04/03/2015   CLINICAL DATA:  Wounds on the buttock area.  Evaluate for SVT.  EXAM: PORTABLE CHEST - 1 VIEW  COMPARISON:  09/04/2014  FINDINGS: Again noted is a right jugular Port-A-Cath. A catheter tip is more midline than expected but this may be related to patient positioning. Tip is likely in the lower SVC region. Atherosclerotic calcifications at the aortic arch. Heart size is within normal limits. There is a bandlike density overlying the left cardiac apex which probably represents overlying soft tissues. Otherwise, the lungs are clear. Degenerative changes in thoracic spine.  IMPRESSION: No acute chest findings.   Electronically Signed   By: Markus Daft M.D.   On: 04/03/2015 15:22     EKG Interpretation None      Reviewed cardiac strips from EMS, which indicates some irregular and irregular sinus tachycardia roughly in the range of 180/m.   Date: 04/03/15- MUSE Hyperlink is inactive  Rate: 96  Rhythm: normal sinus rhythm and premature ventricular contractions (PVC)  QRS Axis: normal  PR and QT Intervals: normal  ST/T Wave abnormalities: normal  PR and QRS Conduction Disutrbances:none  Narrative Interpretation:   Old EKG Reviewed: unchanged   MDM   Final diagnoses:  Tachycardia, paroxysmal  Dehydration  Anorexia    Weakness secondary to tachycardia. Tachycardia was  marked and peers to be sinus related. Laboratory evaluation  indicates mild dehydration. No evidence for seizures bacterial infection. Metabolic instability or ACS. Patient will require admission for observation on cardiac monitor, and need to be assessed for problems eating.  Nursing Notes Reviewed/ Care Coordinated, and agree without changes. Applicable Imaging Reviewed.  Interpretation of Laboratory Data incorporated into ED treatment  Plan: Admit    Daleen Bo, MD 04/03/15 1810

## 2015-04-03 NOTE — ED Notes (Signed)
Wentz MD made aware of pt bp

## 2015-04-03 NOTE — Telephone Encounter (Signed)
Patients daughter is calling back and they needs some 24 hour care for her asap. Please call her at [phone]

## 2015-04-03 NOTE — Telephone Encounter (Signed)
Received call from [place] @ [facility] re:  Needs  Verbal  Order  For  recertification to continue nursing and wound care in home for pt.   [facility]   Phone    [phone].

## 2015-04-03 NOTE — ED Notes (Signed)
Pt reports exertional SOB x2 days.  Pt was getting out of car from wound center when she became SOB and diaphoretic.  Pt was in SVT at a rate of 180 upon fire arrival.  Pt had two additional runs of SVT PTA that was converted with vagal maneuvers.  Pt denies chest pain.

## 2015-04-03 NOTE — Telephone Encounter (Signed)
ok 

## 2015-04-03 NOTE — Telephone Encounter (Signed)
Pt has fallen 2 or 3 times and has diarrhea. Pt is at the wound care center now.  Dtr is asking for 24 hour care due to pt being in critical condition. Pt dtr is asking for SNF or HH with 24 hour care.

## 2015-04-04 ENCOUNTER — Ambulatory Visit: Payer: Medicare Other | Admitting: Nurse Practitioner

## 2015-04-04 ENCOUNTER — Other Ambulatory Visit: Payer: Medicare Other

## 2015-04-04 ENCOUNTER — Telehealth: Payer: Self-pay | Admitting: *Deleted

## 2015-04-04 DIAGNOSIS — R000 Tachycardia, unspecified: Secondary | ICD-10-CM

## 2015-04-04 LAB — CBC WITH DIFFERENTIAL/PLATELET
BASOS PCT: 0 % (ref 0–1)
Basophils Absolute: 0 10*3/uL (ref 0.0–0.1)
Eosinophils Absolute: 0 10*3/uL (ref 0.0–0.7)
Eosinophils Relative: 0 % (ref 0–5)
HCT: 27.4 % — ABNORMAL LOW (ref 36.0–46.0)
HEMOGLOBIN: 9.3 g/dL — AB (ref 12.0–15.0)
LYMPHS PCT: 92 % — AB (ref 12–46)
Lymphs Abs: 8.4 10*3/uL — ABNORMAL HIGH (ref 0.7–4.0)
MCH: 32.4 pg (ref 26.0–34.0)
MCHC: 33.9 g/dL (ref 30.0–36.0)
MCV: 95.5 fL (ref 78.0–100.0)
Monocytes Absolute: 0.4 10*3/uL (ref 0.1–1.0)
Monocytes Relative: 4 % (ref 3–12)
Neutro Abs: 0.4 10*3/uL — ABNORMAL LOW (ref 1.7–7.7)
Neutrophils Relative %: 4 % — ABNORMAL LOW (ref 43–77)
PLATELETS: 46 10*3/uL — AB (ref 150–400)
RBC: 2.87 MIL/uL — ABNORMAL LOW (ref 3.87–5.11)
RDW: 23.4 % — ABNORMAL HIGH (ref 11.5–15.5)
WBC: 9.2 10*3/uL (ref 4.0–10.5)

## 2015-04-04 LAB — BASIC METABOLIC PANEL
Anion gap: 11 (ref 5–15)
BUN: 25 mg/dL — ABNORMAL HIGH (ref 6–20)
CO2: 20 mmol/L — ABNORMAL LOW (ref 22–32)
Calcium: 8 mg/dL — ABNORMAL LOW (ref 8.9–10.3)
Chloride: 102 mmol/L (ref 101–111)
Creatinine, Ser: 1.21 mg/dL — ABNORMAL HIGH (ref 0.44–1.00)
GFR, EST AFRICAN AMERICAN: 48 mL/min — AB (ref >60)
GFR, EST NON AFRICAN AMERICAN: 41 mL/min — AB (ref >60)
Glucose, Bld: 132 mg/dL — ABNORMAL HIGH (ref 65–99)
POTASSIUM: 4.2 mmol/L (ref 3.5–5.1)
Sodium: 133 mmol/L — ABNORMAL LOW (ref 135–145)

## 2015-04-04 LAB — CLOSTRIDIUM DIFFICILE BY PCR: Toxigenic C. Difficile by PCR: NEGATIVE

## 2015-04-04 LAB — PREPARE RBC (CROSSMATCH)

## 2015-04-04 LAB — TSH: TSH: 0.558 u[IU]/mL (ref 0.350–4.500)

## 2015-04-04 MED ORDER — DIPHENHYDRAMINE HCL 25 MG PO CAPS
25.0000 mg | ORAL_CAPSULE | Freq: Once | ORAL | Status: AC
  Administered 2015-04-04: 25 mg via ORAL
  Filled 2015-04-04: qty 1

## 2015-04-04 MED ORDER — VANCOMYCIN HCL 10 G IV SOLR
1250.0000 mg | INTRAVENOUS | Status: DC
  Filled 2015-04-04: qty 1250

## 2015-04-04 MED ORDER — ACETAMINOPHEN 325 MG PO TABS
325.0000 mg | ORAL_TABLET | Freq: Once | ORAL | Status: AC
  Administered 2015-04-04: 325 mg via ORAL
  Filled 2015-04-04: qty 1

## 2015-04-04 MED ORDER — ACETAMINOPHEN 325 MG PO TABS
650.0000 mg | ORAL_TABLET | Freq: Once | ORAL | Status: AC
  Administered 2015-04-04: 650 mg via ORAL
  Filled 2015-04-04: qty 2

## 2015-04-04 MED ORDER — SODIUM CHLORIDE 0.9 % IV SOLN
Freq: Once | INTRAVENOUS | Status: DC
Start: 2015-04-04 — End: 2015-04-08

## 2015-04-04 MED ORDER — VANCOMYCIN HCL 10 G IV SOLR
1500.0000 mg | Freq: Once | INTRAVENOUS | Status: AC
  Administered 2015-04-04: 1500 mg via INTRAVENOUS
  Filled 2015-04-04: qty 1500

## 2015-04-04 MED ORDER — VANCOMYCIN 50 MG/ML ORAL SOLUTION
250.0000 mg | Freq: Four times a day (QID) | ORAL | Status: DC
  Administered 2015-04-04 – 2015-04-06 (×6): 250 mg via ORAL
  Filled 2015-04-04 (×11): qty 5

## 2015-04-04 MED ORDER — DEXTROSE 5 % IV SOLN
2.0000 g | Freq: Two times a day (BID) | INTRAVENOUS | Status: DC
  Administered 2015-04-04 – 2015-04-08 (×8): 2 g via INTRAVENOUS
  Filled 2015-04-04 (×9): qty 2

## 2015-04-04 MED ORDER — FUROSEMIDE 10 MG/ML IJ SOLN
20.0000 mg | Freq: Once | INTRAMUSCULAR | Status: AC
  Administered 2015-04-04: 20 mg via INTRAVENOUS
  Filled 2015-04-04: qty 2

## 2015-04-04 NOTE — Progress Notes (Addendum)
Initial Nutrition Assessment  DOCUMENTATION CODES:  Obesity unspecified  INTERVENTION:   Continue Ensure Enlive po BID, each supplement provides 350 kcal and 20 grams of protein  NUTRITION DIAGNOSIS:  Increased nutrient needs related to wound healing, catabolic illness as evidenced by estimated needs  GOAL:  Patient will meet greater than or equal to 90% of their needs  MONITOR:  PO intake, Supplement acceptance, Labs, Weight trends, I & O's, Skin  REASON FOR ASSESSMENT:  Low Braden, Malnutrition Screening Tool  ASSESSMENT: 81 y.o. Female with PMH of CLL undergoing chemotherapy, stroke with mild right-sided weakness, generalized weakness ambulates with a walker and lives with her daughter, breast cancer status post left mastectomy, essential hypertension, anemia of chronic disease, early dementia who's been having cancer/chemotherapy related skin ulceration since April.  Pt sleeping upon RD visit.  Noted opened Ensure Enlive bottle on pt tray table.  Appetite has been decreased per chart review.  PO intake 50% per flowsheet records.  Per readings below, pt's weight has been trending down since March 2016.  CWOCN note reviewed.  Pt with dermal, full thickness wounds related to her chemotherapy.  Low braden score places patient at risk for further skin breakdown.  Height:  Ht Readings from Last 1 Encounters:  04/03/15 5\' 3"  (1.6 m)    Weight:  Wt Readings from Last 1 Encounters:  04/03/15 187 lb (84.823 kg)    Ideal Body Weight:  52.2 kg  Wt Readings from Last 10 Encounters:  04/03/15 187 lb (84.823 kg)  03/28/15 189 lb 12 oz (86.07 kg)  03/13/15 195 lb (88.451 kg)  02/23/15 209 lb 14.4 oz (95.21 kg)  02/14/15 211 lb 10.3 oz (96 kg)  02/09/15 205 lb 9.6 oz (93.26 kg)  01/26/15 210 lb 3.2 oz (95.346 kg)  01/19/15 212 lb 1.6 oz (96.208 kg)  01/04/15 210 lb 9.6 oz (95.528 kg)  12/15/14 209 lb 9.6 oz (95.074 kg)    BMI:  Body mass index is 33.13  kg/(m^2).  Estimated Nutritional Needs:  Kcal:  1500-1700  Protein:  90-100 gm  Fluid:  1.5-1.7 L  Skin:  Wound (see comment) (Stage II to III wounds to buttock and vulvovaginal area)  Diet Order:  Heart Healthy  EDUCATION NEEDS:  No education needs identified at this time   Intake/Output Summary (Last 24 hours) at 04/04/15 1231 Last data filed at 04/04/15 1227  Gross per 24 hour  Intake    690 ml  Output      0 ml  Net    690 ml    Last BM:  7/6  Arthur Holms, RD, LDN Pager #: [phone] After-Hours Pager #: [phone]

## 2015-04-04 NOTE — Telephone Encounter (Signed)
"  Dr. Benay Spice will go to [facility] to see [name] tomorrow," per Ned Card NP.  [name] with this information.

## 2015-04-04 NOTE — Progress Notes (Signed)
Paged the Dr.Elgergaw because the patient temp went up to 101.2 at her 15 minutes post start of adimin of blood. Per medication of benadryl and tylenol was given prior to blood starting.  He asked to stop for 30 minutes and let tylenol kick in then restart if temp comes down.  Very hot in patient room so turned temp down. Her B/P is 111/38  HR 54  Map 74 O2 Sat 99.  Fraser Din is sleepy but has had benadryl.  Will monitor.

## 2015-04-04 NOTE — Progress Notes (Signed)
Patient tolerating blood infusion well

## 2015-04-04 NOTE — Telephone Encounter (Signed)
Spoke with Chong Sicilian, RN @ [facility] and informed her re:  Per Dr. Benay Spice, [place] to continue with nursing and wound care in home for pt.  Patty stated she would send paper forms to office for Dr. Benay Spice to sign.

## 2015-04-04 NOTE — Care Management Note (Signed)
Case Management Note  Patient Details  Name: [name] MRN: [id number] Date of Birth: April 21, 1934  Subjective/Objective:    Pt admitted with anemia, tachycardia, hx of CA with current chemo tx               Action/Plan: PTA pt lived at home with daughter- NCM to follow for d/c needs  Expected Discharge Date:                  Expected Discharge Plan:  Home/Self Care  In-House Referral:     Discharge planning Services  CM Consult  Post Acute Care Choice:    Choice offered to:     DME Arranged:    DME Agency:     HH Arranged:    HH Agency:     Status of Service:  In process, will continue to follow  Medicare Important Message Given:    Date Medicare IM Given:    Medicare IM give by:    Date Additional Medicare IM Given:    Additional Medicare Important Message give by:     If discussed at [facility] of Stay Meetings, dates discussed:    Additional Comments:  Dawayne Patricia, RN 04/04/2015, 9:57 AM

## 2015-04-04 NOTE — Progress Notes (Signed)
Pt. was brought to unit via stretcher and accompanied by ER-RN. Vital signs, tele, and head to to assessment completed. Pt. has no pain and family is by bedside. [name] Doree Fudge @2000 

## 2015-04-04 NOTE — Progress Notes (Signed)
[facility]  Patient Status: Active (receiving services up to time of hospitalization)  [facility] is providing the following services: RN  If patient discharges after hours, please call [phone].   [name] 04/04/2015, 9:56 AM

## 2015-04-04 NOTE — Progress Notes (Signed)
Restarted blood transfusion at 120 ml/h and I will remain in room and watch for reaction.  Vitals noted on flow sheet.

## 2015-04-04 NOTE — Clinical Social Work Note (Signed)
CSW received consult for patient to go to SNF for short term rehab.  CSW met with patient and her family to explain the process of finding SNF placement, and explaining role of Education officer, museum.  Patient and her family are in agreement to going to SNF for short term rehab, CSW will begin SNF placement search.  Formal assessment to follow, patient and their family prefer a facility in [facility] area.  Jones Broom. [place], MSW, [place] 04/04/2015 5:15 PM

## 2015-04-04 NOTE — Progress Notes (Signed)
Utilization review completed.  

## 2015-04-04 NOTE — Progress Notes (Signed)
.  cCRITICAL VALUE ALERT  Critical value received:   Hgb 6.7  Date of notification:  04/04/2015   Time of notification:  5:28 AM   Critical value read back:Yes.    Nurse who received alert: Shirley Muscat   MD notified (1st page):  Baltazar Najjar, MD  Time of first page:  5:28 AM   MD notified (2nd page):n/a  Time of second page:  Responding MD:    Time MD responded:  Shirley Muscat 5:29 AM

## 2015-04-04 NOTE — Consult Note (Signed)
WOC wound consult note Reason for Consult: multiple wounds Reviewed chart and patient is followed by the wound care center for gluteal wounds, right groin, and vulvar wounds. She has dermal, full thickness wounds related to her chemotherapy. She has loose, frequent watery diarrhea that is suspicious for Cdiff with odor. Wound type: Dermal full thickness wounds from chemotherapy Pressure Ulcer POA: No Measurement: inner gluteal fold: 20cm x 5cm x 0.2cm, one deeper area at the lateral right edge of the wound aprox. 2 oclock that is 0.5cm deep Right groin: 5cm x 1.5cm x 0.2cm  vulvar unable to measure Wound bed: the wounds are mostly clean, no necrotic tissue present, foul odor but I suspect this is from the oozing of stool.  No purulent drainage noted otherwise.  Very wet in the gluteal fold, the right groin is clean as well.  Does not appear infected Drainage (amount, consistency, odor) see above Periwound: intact  Dressing procedure/placement/frequency: Will add conservative moist gauze dressings for now, due to the fact that the patient is incontinent of stool and urine and the dressings will be soiled frequently, will add Flexiseal for stool management for now to improve patients discomfort from constant cleaning of the areas.  May benefit from foley cath as well, will let primary care MD make that decision   Ms. Discussed POC with patient and bedside nurse.  Re consult if needed, will not follow at this time. Thanks  Joyclyn Plazola Kellogg, [place] [phone])

## 2015-04-04 NOTE — Telephone Encounter (Signed)
Patient's daughter Katharine Look called reporting "[name] is in the [facility] room [phone].  Family would like to keep today's appointment for information about Ms. Police's Bone Marrow and health status.  We don't know if she needs Hospice or to be placed."  This nurse made her aware that Hospice does visit facilities and the family should discuss with the Case Manager and SW what they ar capable of doing for her at this time.  "My sister says she needs care around the clock."  Family cannot be seen without patient but this nurse will notify Dr. Benay Spice of admission.  "We will go to the Hospital and wait to see Dr. Benay Spice come by.  She asked to go to the hospital yesterday after having bad experience of palpitations, chest pain."

## 2015-04-04 NOTE — Progress Notes (Signed)
ANTIBIOTIC CONSULT NOTE - INITIAL  Pharmacy Consult for Vancomycin and Ceftazidime Indication: Febrile neutropenia  Allergies  Allergen Reactions  . Obinutuzumab Shortness Of Breath and Rash  . Penicillins Anaphylaxis, Shortness Of Breath, Itching, Swelling and Rash    Swelling all over body and throat   . Latex Itching    Patient Measurements: Height: 5\' 3"  (160 cm) Weight: 187 lb (84.823 kg) IBW/kg (Calculated) : 52.4  Vital Signs: Temp: 99.5 F (37.5 C) (07/06 1406) Temp Source: Oral (07/06 1406) BP: 113/44 mmHg (07/06 1406) Pulse Rate: 66 (07/06 1406) Intake/Output from previous day:   Intake/Output from this shift: Total I/O In: 690 [P.O.:660; Blood:30] Out: -   Labs:  Recent Labs  04/03/15 1523 04/04/15 0345  WBC 6.7 6.1  HGB 8.0* 6.7*  PLT 73* 59*  CREATININE 1.53* 1.21*   Estimated Creatinine Clearance: 38.9 mL/min (by C-G formula based on Cr of 1.21). No results for input(s): VANCOTROUGH, VANCOPEAK, VANCORANDOM, GENTTROUGH, GENTPEAK, GENTRANDOM, TOBRATROUGH, TOBRAPEAK, TOBRARND, AMIKACINPEAK, AMIKACINTROU, AMIKACIN in the last 72 hours.   Microbiology: Recent Results (from the past 720 hour(s))  TECHNOLOGIST REVIEW     Status: None   Collection Time: 03/21/15 10:38 AM  Result Value Ref Range Status   Technologist Review   Final    Few Variant lymphs present. few teardrops & ovalos. 1 nrbc  Culture, blood (routine x 2)     Status: None   Collection Time: 03/21/15  7:02 PM  Result Value Ref Range Status   Specimen Description BLOOD RIGHT ANTECUBITAL  Final   Special Requests BOTTLES DRAWN AEROBIC AND ANAEROBIC 2ML,3ML  Final   Culture   Final    NO GROWTH 5 DAYS Performed at [facility]    Report Status 03/26/2015 FINAL  Final  Culture, blood (routine x 2)     Status: None   Collection Time: 03/21/15  7:25 PM  Result Value Ref Range Status   Specimen Description BLOOD RIGHT CHEST  Final   Special Requests BOTTLES DRAWN AEROBIC AND  ANAEROBIC 5ML  Final   Culture   Final    NO GROWTH 5 DAYS Performed at [facility]    Report Status 03/26/2015 FINAL  Final  Urine culture     Status: None   Collection Time: 03/21/15 10:06 PM  Result Value Ref Range Status   Specimen Description URINE, CLEAN CATCH  Final   Special Requests NONE  Final   Culture   Final    MULTIPLE SPECIES PRESENT, SUGGEST RECOLLECTION IF CLINICALLY INDICATED Performed at [facility]    Report Status 03/23/2015 FINAL  Final  TECHNOLOGIST REVIEW     Status: None   Collection Time: 03/28/15 12:43 PM  Result Value Ref Range Status   Technologist Review Variant lymphs present  Final  Culture, blood (routine x 2)     Status: None (Preliminary result)   Collection Time: 04/03/15  6:44 PM  Result Value Ref Range Status   Specimen Description BLOOD RIGHT ANTECUBITAL  Final   Special Requests BOTTLES DRAWN AEROBIC ONLY 2CC  Final   Culture NO GROWTH < 24 HOURS  Final   Report Status PENDING  Incomplete  Clostridium Difficile by PCR (not at [facility])     Status: None   Collection Time: 04/04/15  6:41 AM  Result Value Ref Range Status   C difficile by pcr NEGATIVE NEGATIVE Final    Medical History: Past Medical History  Diagnosis Date  . Benign essential HTN   .  Macrocytic anemia   . Vertigo     ?TIA - MRI brain 5/13  No new CVA  . Pleurisy     01/16/12 pleuritic right chest pain  V/Q scan [place] [facility] low probability PE  . Hyperlipidemia   . Thrombocytopenia, unspecified 06/15/2013  . History of blood transfusion "several"    "related to her cancer"  . GERD (gastroesophageal reflux disease)   . Stroke 05/2009    05/2009  Right upper extrem/leg weakness; nuchal pain;  . Arthritis     "knees a little" (04/03/2015)  . Anxiety   . Depression   . 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 Breast cancer left T2N0 S/P  mastectomy and SLN Bx 09/2010 2009  . CLL (chronic lymphoid leukemia) in relapse    Assessment: 81 yo F presents on 7/5 with tachycardia. History of CLL undergoing chemotherapy.  Pharmacy to dose broad spectrum abx for febrile neutropenia. Tmax 101, WBC wnl. SCr down to 1.21, CrCl ~39ml/min.  Goal of Therapy:  Vancomycin trough level 15-20 mcg/ml  Resolution of infection  Plan:  Give vancomycin 1.5g IV x 1, then start 1250mg  IV Q24 tomorrow Start ceftazidime 2g IV Q12 Monitor clinical picture, renal function, VT prn F/U C&S, abx deescalation / LOT   Lawan Nanez J 04/04/2015,3:32 PM

## 2015-04-04 NOTE — Progress Notes (Signed)
PT Cancellation Note  Patient Details Name: KENYOTTA DORFMAN MRN: [id number] DOB: 11/10/1933   Cancelled Treatment:    Reason Eval/Treat Not Completed: Patient not medically ready. Pt with Hg of 6.7 g/dL this morning. Will hold PT eval until pt is medically ready to participate.   Rolinda Roan 04/04/2015, 8:37 AM   Rolinda Roan, PT, DPT Acute Rehabilitation Services Pager: [phone]

## 2015-04-04 NOTE — Progress Notes (Signed)
Patient Demographics  [name], is a 81 y.o. female, DOB - 1934/03/21, MAU:[id number]  Admit date - 04/03/2015   Admitting Physician Thurnell Lose, MD  Outpatient Primary MD for the patient is Gwendolyn Grant, MD  LOS - 1   Chief Complaint  Patient presents with  . Tachycardia       Admission HPI/Brief narrative: 81 y.o. female, history of CLL undergoing chemotherapy , stroke with mild right-sided weakness, generalized weakness ambulates with a walker and lives with her daughter, breast cancer status post left mastectomy, essential hypertension, anemia of chronic disease, early dementia who's been having cancer/chemotherapy related skin ulceration since April, this has been followed by dermatology outpatient and biopsied. She has 2 stage II to 3 deep ulcers 1 midline in between her buttocks and second in her vulvovaginal area, presents from 1 clinic secondary to diarrhea, noticed to have SVT and ambulance.  Subjective:   [name] today has, No headache, No chest pain, No abdominal pain - No Nausea,  No Cough , patient reports generalized weakness and shortness of breath.  Assessment & Plan    Principal Problem:   Tachycardia Active Problems:   Breast cancer left T2N0 S/P mastectomy and SLN Bx 09/2010   Benign essential HTN   Hyperlipidemia   CLL (chronic lymphocytic leukemia)   SVT (supraventricular tachycardia)  SVT - Most likely related to severe dehydration, and anemia, recurrent since admission. - Continue with telemetry monitor - Patient is on atenolol.  Pancytopenia in the setting of CLL - Discussed with Dr. Learta Codding, , to be seen by him in a.m.Marland Kitchen - Following at [facility], and was for bone marrow biopsy - Anemia, hemoglobin dropped from 8 on admission to 6.7 today, likely delusional effect, will be transfused 2 units packed blood cell giving her dyspnea. - Hold chemical  anticoagulation for her thrombocytopenia. - Will start patient on broad-spectrum IV antibiotics giving her neutropenia(will check CBC with differential in a.m, but patient had 3% neutrophil last week).  Neutropenic fever - Unclear fever related to blood transfusion or not, giving her neutropenia she'll be started on broad-spectrum IV antibiotics including vancomycin and cefepime.  Diarrhea - C. difficile by PCR is negative, we'll stop oral vancomycin.  Skin ulcers - Opinion with wound care  Dementia - Continue with Aricept  Hypertension - Continue with atenolol  Acute on chronic renal disease - Baseline creatinine 1.2, creatinine is 1.5 on admission, back to baseline after hydration.   Code Status: DO NOT RESUSCITATE  Family Communication: None at bedside  Disposition Plan: Pending further workup   Procedures  None   Consults   To be seen by oncology in a.m.   Medications  Scheduled Meds: . sodium chloride   Intravenous STAT  . sodium chloride   Intravenous Once  . acetaminophen  325 mg Oral Once  . acyclovir  400 mg Oral Daily  . atenolol  50 mg Oral QHS  . donepezil  10 mg Oral QHS  . feeding supplement (ENSURE ENLIVE)  237 mL Oral BID BM  . furosemide  20 mg Intravenous Once  . sodium chloride  3 mL Intravenous Q12H   Continuous Infusions:  PRN Meds:.guaiFENesin-dextromethorphan, HYDROcodone-acetaminophen, ondansetron **OR** ondansetron (ZOFRAN) IV, sodium chloride  DVT Prophylaxis  SCDs , no chemical anticoagulation given her thrombocytopenia  Lab Results  Component Value Date   PLT 59* 04/04/2015    Antibiotics   Anti-infectives    Start     Dose/Rate Route Frequency Ordered Stop   04/03/15 2100  acyclovir (ZOVIRAX) tablet 400 mg     400 mg Oral Daily 04/03/15 2005     04/03/15 1915  vancomycin (VANCOCIN) 50 mg/mL oral solution 250 mg  Status:  Discontinued     250 mg Oral 4 times daily 04/03/15 1904 04/04/15 0829   04/03/15 1845  vancomycin  (VANCOCIN) 50 mg/mL oral solution 250 mg  Status:  Discontinued     250 mg Oral 4 times per day 04/03/15 1843 04/03/15 1903          Objective:   Filed Vitals:   04/04/15 1345 04/04/15 1351 04/04/15 1400 04/04/15 1406  BP:  117/42 110/45 113/44  Pulse:  71 61 66  Temp: 99 F (37.2 C) 99.9 F (37.7 C) 99.7 F (37.6 C) 99.5 F (37.5 C)  TempSrc: Oral Oral Oral Oral  Resp: 16 18 16 16   Height:      Weight:      SpO2:  98%      Wt Readings from Last 3 Encounters:  04/03/15 84.823 kg (187 lb)  03/28/15 86.07 kg (189 lb 12 oz)  03/13/15 88.451 kg (195 lb)     Intake/Output Summary (Last 24 hours) at 04/04/15 1507 Last data filed at 04/04/15 1239  Gross per 24 hour  Intake    690 ml  Output      0 ml  Net    690 ml     Physical Exam  Awake Alert, Oriented . [place].AT,PERRAL Supple Neck,No JVD, No cervical lymphadenopathy appriciated.  Symmetrical Chest wall movement, decreased air movement bilaterally,  RRR,No Gallops,Rubs or new Murmurs, No Parasternal Heave +ve B.Sounds, Abd Soft,mild tenderness to palpation, No organomegaly appriciated, No rebound - guarding or rigidity. No Cyanosis,  pale, Clubbing or edema,has ulcers and buttocks and vulvovaginal area.   Data Review   Micro Results Recent Results (from the past 240 hour(s))  TECHNOLOGIST REVIEW     Status: None   Collection Time: 03/28/15 12:43 PM  Result Value Ref Range Status   Technologist Review Variant lymphs present  Final  Culture, blood (routine x 2)     Status: None (Preliminary result)   Collection Time: 04/03/15  6:44 PM  Result Value Ref Range Status   Specimen Description BLOOD RIGHT ANTECUBITAL  Final   Special Requests BOTTLES DRAWN AEROBIC ONLY 2CC  Final   Culture NO GROWTH < 24 HOURS  Final   Report Status PENDING  Incomplete  Clostridium Difficile by PCR (not at [facility])     Status: None   Collection Time: 04/04/15  6:41 AM  Result Value Ref Range Status   C difficile by pcr NEGATIVE  NEGATIVE Final    Radiology Reports Dg Chest Port 1 View  04/03/2015   CLINICAL DATA:  Wounds on the buttock area.  Evaluate for SVT.  EXAM: PORTABLE CHEST - 1 VIEW  COMPARISON:  09/04/2014  FINDINGS: Again noted is a right jugular Port-A-Cath. A catheter tip is more midline than expected but this may be related to patient positioning. Tip is likely in the lower SVC region. Atherosclerotic calcifications at the aortic arch. Heart size is within normal limits. There is a bandlike density overlying the left cardiac apex which probably represents overlying soft tissues. Otherwise, the  lungs are clear. Degenerative changes in thoracic spine.  IMPRESSION: No acute chest findings.   Electronically Signed   By: Markus Daft M.D.   On: 04/03/2015 15:22     CBC  Recent Labs Lab 04/03/15 1523 04/04/15 0345  WBC 6.7 6.1  HGB 8.0* 6.7*  HCT 24.2* 20.4*  PLT 73* 59*  MCV 98.4 99.5  MCH 32.5 32.7  MCHC 33.1 32.8  RDW NOT CALCULATED 23.2*    Chemistries   Recent Labs Lab 04/03/15 1523 04/04/15 0345  NA 130* 133*  K 3.3* 4.2  CL 96* 102  CO2 20* 20*  GLUCOSE 168* 132*  BUN 27* 25*  CREATININE 1.53* 1.21*  CALCIUM 8.3* 8.0*   ------------------------------------------------------------------------------------------------------------------ estimated creatinine clearance is 38.9 mL/min (by C-G formula based on Cr of 1.21). ------------------------------------------------------------------------------------------------------------------ No results for input(s): HGBA1C in the last 72 hours. ------------------------------------------------------------------------------------------------------------------ No results for input(s): CHOL, HDL, LDLCALC, TRIG, CHOLHDL, LDLDIRECT in the last 72 hours. ------------------------------------------------------------------------------------------------------------------  Recent Labs  04/04/15 0345  TSH 0.558    ------------------------------------------------------------------------------------------------------------------ No results for input(s): VITAMINB12, FOLATE, FERRITIN, TIBC, IRON, RETICCTPCT in the last 72 hours.  Coagulation profile No results for input(s): INR, PROTIME in the last 168 hours.  No results for input(s): DDIMER in the last 72 hours.  Cardiac Enzymes No results for input(s): CKMB, TROPONINI, MYOGLOBIN in the last 168 hours.  Invalid input(s): CK ------------------------------------------------------------------------------------------------------------------ Invalid input(s): POCBNP     Time Spent in minutes   30 minutes   Aadvik Roker M.D on 04/04/2015 at 3:07 PM  Between 7am to 7pm - Pager - [phone]  After 7pm go to [email or web address] - password [facility]  [facility]   Office  [phone]

## 2015-04-05 ENCOUNTER — Telehealth: Payer: Self-pay | Admitting: *Deleted

## 2015-04-05 DIAGNOSIS — Z8673 Personal history of transient ischemic attack (TIA), and cerebral infarction without residual deficits: Secondary | ICD-10-CM

## 2015-04-05 DIAGNOSIS — E860 Dehydration: Secondary | ICD-10-CM

## 2015-04-05 DIAGNOSIS — D649 Anemia, unspecified: Secondary | ICD-10-CM

## 2015-04-05 DIAGNOSIS — C50912 Malignant neoplasm of unspecified site of left female breast: Secondary | ICD-10-CM

## 2015-04-05 DIAGNOSIS — L98499 Non-pressure chronic ulcer of skin of other sites with unspecified severity: Secondary | ICD-10-CM

## 2015-04-05 DIAGNOSIS — R531 Weakness: Secondary | ICD-10-CM

## 2015-04-05 DIAGNOSIS — D61818 Other pancytopenia: Secondary | ICD-10-CM

## 2015-04-05 LAB — BASIC METABOLIC PANEL
ANION GAP: 6 (ref 5–15)
BUN: 23 mg/dL — ABNORMAL HIGH (ref 6–20)
CO2: 23 mmol/L (ref 22–32)
CREATININE: 1.06 mg/dL — AB (ref 0.44–1.00)
Calcium: 7.8 mg/dL — ABNORMAL LOW (ref 8.9–10.3)
Chloride: 103 mmol/L (ref 101–111)
GFR calc non Af Amer: 49 mL/min — ABNORMAL LOW (ref >60)
GFR, EST AFRICAN AMERICAN: 56 mL/min — AB (ref >60)
Glucose, Bld: 138 mg/dL — ABNORMAL HIGH (ref 65–99)
Potassium: 3.6 mmol/L (ref 3.5–5.1)
Sodium: 132 mmol/L — ABNORMAL LOW (ref 135–145)

## 2015-04-05 LAB — TYPE AND SCREEN
ABO/RH(D): A POS
ANTIBODY SCREEN: NEGATIVE
UNIT DIVISION: 0
Unit division: 0

## 2015-04-05 LAB — URINALYSIS, ROUTINE W REFLEX MICROSCOPIC
Bilirubin Urine: NEGATIVE
Glucose, UA: NEGATIVE mg/dL
Hgb urine dipstick: NEGATIVE
Ketones, ur: NEGATIVE mg/dL
Leukocytes, UA: NEGATIVE
Nitrite: NEGATIVE
Protein, ur: NEGATIVE mg/dL
Specific Gravity, Urine: 1.014 (ref 1.005–1.030)
Urobilinogen, UA: 0.2 mg/dL (ref 0.0–1.0)
pH: 5 (ref 5.0–8.0)

## 2015-04-05 LAB — CBC
HCT: 36.1 % (ref 36.0–46.0)
Hemoglobin: 11.8 g/dL — ABNORMAL LOW (ref 12.0–15.0)
MCH: 30.6 pg (ref 26.0–34.0)
MCHC: 32.7 g/dL (ref 30.0–36.0)
MCV: 93.5 fL (ref 78.0–100.0)
Platelets: 23 10*3/uL — CL (ref 150–400)
RBC: 3.86 MIL/uL — ABNORMAL LOW (ref 3.87–5.11)
RDW: 23.2 % — AB (ref 11.5–15.5)
WBC: 2.5 10*3/uL — AB (ref 4.0–10.5)

## 2015-04-05 MED ORDER — MORPHINE SULFATE 2 MG/ML IJ SOLN
2.0000 mg | INTRAMUSCULAR | Status: DC | PRN
  Administered 2015-04-05 – 2015-04-08 (×4): 2 mg via INTRAVENOUS
  Filled 2015-04-05 (×5): qty 1

## 2015-04-05 MED ORDER — OXYCODONE-ACETAMINOPHEN 5-325 MG PO TABS
1.0000 | ORAL_TABLET | Freq: Once | ORAL | Status: AC
  Administered 2015-04-05: 1 via ORAL
  Filled 2015-04-05: qty 1

## 2015-04-05 MED ORDER — OXYCODONE HCL 5 MG PO TABS
5.0000 mg | ORAL_TABLET | ORAL | Status: DC | PRN
Start: 2015-04-05 — End: 2015-04-06
  Administered 2015-04-05: 5 mg via ORAL
  Filled 2015-04-05 (×2): qty 1

## 2015-04-05 MED ORDER — LIDOCAINE-PRILOCAINE 2.5-2.5 % EX CREA
TOPICAL_CREAM | Freq: Once | CUTANEOUS | Status: AC
  Filled 2015-04-05: qty 5

## 2015-04-05 NOTE — Progress Notes (Signed)
Patient Demographics  [name], is a 81 y.o. female, DOB - 1934/08/01, QBV:[id number]  Admit date - 04/03/2015   Admitting Physician Thurnell Lose, MD  Outpatient Primary MD for the patient is Gwendolyn Grant, MD  LOS - 2   Chief Complaint  Patient presents with  . Tachycardia       Admission HPI/Brief narrative: 81 y.o. female, history of CLL undergoing chemotherapy , stroke with mild right-sided weakness, generalized weakness ambulates with a walker and lives with her daughter, breast cancer status post left mastectomy, essential hypertension, anemia of chronic disease, early dementia who's been having cancer/chemotherapy related skin ulceration since April, this has been followed by dermatology outpatient and biopsied. She has 2 stage II to 3 deep ulcers 1 midline in between her buttocks and second in her vulvovaginal area, presents from 1 clinic secondary to diarrhea, noticed to have SVT and ambulance.  Subjective:   Ashling Tomeo today has, No headache, No chest pain, No abdominal pain - No Nausea,  No Cough , patient reports generalized weakness and shortness of breath, no further diarrhea.  Assessment & Plan    Principal Problem:   Tachycardia Active Problems:   Breast cancer left T2N0 S/P mastectomy and SLN Bx 09/2010   Benign essential HTN   Hyperlipidemia   CLL (chronic lymphocytic leukemia)   SVT (supraventricular tachycardia)  SVT - Most likely related to severe dehydration, and anemia, recurrent since admission. - Continue with telemetry monitor - Patient is on atenolol.  Pancytopenia in the setting of CLL - In college he consult greatly appreciated, has advanced stage refractory chronic lymphocytic leukemia, biopsy at [facility] 6/27 confirmed chronic lymphocytic leukemia. - Transfused 2 units packed red blood cells for her anemia 04/04/15. - Worsening thrombocytopenia in the  setting of sepsis, and advanced CLL, continue to monitor, no indication for transfusion at. - Recommendation to continue with comfort/supportive care. - Hold chemical anticoagulation for her thrombocytopenia.  Neutropenic fever/gram-negative rods bacteremia - Continue with broad-spectrum IV antibiotics vancomycin and Fortaz, will DC vancomycin in the next 24 hours if remains a febrile. - Unclear source of gram-negative bacteremia, negative urinalysis  Diarrhea - C. difficile by PCR is negative, we'll stop oral vancomycin.  Skin ulcers - Continue with wound care, will insert Foley catheter. Unlikely infected, but she is on broad-spectrum IV antibiotics.  Dementia - Continue with Aricept  Hypertension - Continue with atenolol  Acute on chronic renal disease - Baseline creatinine 1.2, creatinine is 1.5 on admission, back to baseline after hydration.   Code Status: DO NOT RESUSCITATE, overall very poor prognosis this message was relayed to daughter.  Family Communication: spoke with daughter Ms Jeneen Rinks 04/05/15  Disposition Plan: Pending further workup   Procedures  None   Consults   oncology   Medications  Scheduled Meds: . sodium chloride   Intravenous Once  . acyclovir  400 mg Oral Daily  . atenolol  50 mg Oral QHS  . cefTAZidime (FORTAZ)  IV  2 g Intravenous Q12H  . donepezil  10 mg Oral QHS  . feeding supplement (ENSURE ENLIVE)  237 mL Oral BID BM  . lidocaine-prilocaine   Topical Once  . sodium chloride  3 mL Intravenous Q12H  . vancomycin  1,250 mg  Intravenous Q24H  . vancomycin  250 mg Oral 4 times per day   Continuous Infusions:  PRN Meds:.guaiFENesin-dextromethorphan, morphine, ondansetron **OR** ondansetron (ZOFRAN) IV, oxyCODONE, sodium chloride  DVT Prophylaxis   SCDs , no chemical anticoagulation given her thrombocytopenia  Lab Results  Component Value Date   PLT 23* 04/05/2015    Antibiotics   Anti-infectives    Start     Dose/Rate Route  Frequency Ordered Stop   04/05/15 1700  vancomycin (VANCOCIN) 1,250 mg in sodium chloride 0.9 % 250 mL IVPB     1,250 mg 166.7 mL/hr over 90 Minutes Intravenous Every 24 hours 04/04/15 1537     04/04/15 1800  vancomycin (VANCOCIN) 50 mg/mL oral solution 250 mg     250 mg Oral 4 times per day 04/04/15 1527     04/04/15 1545  cefTAZidime (FORTAZ) 2 g in dextrose 5 % 50 mL IVPB     2 g 100 mL/hr over 30 Minutes Intravenous Every 12 hours 04/04/15 1535     04/04/15 1545  vancomycin (VANCOCIN) 1,500 mg in sodium chloride 0.9 % 500 mL IVPB     1,500 mg 250 mL/hr over 120 Minutes Intravenous  Once 04/04/15 1535 04/04/15 2006   04/03/15 2100  acyclovir (ZOVIRAX) tablet 400 mg     400 mg Oral Daily 04/03/15 2005     04/03/15 1915  vancomycin (VANCOCIN) 50 mg/mL oral solution 250 mg  Status:  Discontinued     250 mg Oral 4 times daily 04/03/15 1904 04/04/15 0829   04/03/15 1845  vancomycin (VANCOCIN) 50 mg/mL oral solution 250 mg  Status:  Discontinued     250 mg Oral 4 times per day 04/03/15 1843 04/03/15 1903          Objective:   Filed Vitals:   04/05/15 0011 04/05/15 0113 04/05/15 0630 04/05/15 1335  BP: 100/48 126/50 121/51 102/43  Pulse: 60 60 59 60  Temp: 97.9 F (36.6 C) 97.7 F (36.5 C) 98.6 F (37 C) 97.4 F (36.3 C)  TempSrc: Oral Oral Oral Oral  Resp: 16 16 17 17   Height:      Weight:   88.361 kg (194 lb 12.8 oz)   SpO2: 100% 100% 100% 99%    Wt Readings from Last 3 Encounters:  04/05/15 88.361 kg (194 lb 12.8 oz)  03/28/15 86.07 kg (189 lb 12 oz)  03/13/15 88.451 kg (195 lb)     Intake/Output Summary (Last 24 hours) at 04/05/15 1338 Last data filed at 04/05/15 1242  Gross per 24 hour  Intake    830 ml  Output    550 ml  Net    280 ml     Physical Exam  Awake Alert, Oriented . [place].AT,PERRAL Supple Neck,No JVD, No cervical lymphadenopathy appriciated.  Symmetrical Chest wall movement, decreased air movement bilaterally,  RRR,No Gallops,Rubs or new  Murmurs, No Parasternal Heave +ve B.Sounds, Abd Soft,mild tenderness to palpation, No organomegaly appriciated, No rebound - guarding or rigidity. No Cyanosis,  pale, Clubbing or edema,has ulcers and buttocks and vulvovaginal area.   Data Review   Micro Results Recent Results (from the past 240 hour(s))  TECHNOLOGIST REVIEW     Status: None   Collection Time: 03/28/15 12:43 PM  Result Value Ref Range Status   Technologist Review Variant lymphs present  Final  Culture, blood (routine x 2)     Status: None (Preliminary result)   Collection Time: 04/03/15  6:44 PM  Result Value Ref Range Status  Specimen Description BLOOD RIGHT ANTECUBITAL  Final   Special Requests BOTTLES DRAWN AEROBIC ONLY 2CC  Final   Culture NO GROWTH 2 DAYS  Final   Report Status PENDING  Incomplete  Culture, blood (routine x 2)     Status: None (Preliminary result)   Collection Time: 04/04/15 12:20 AM  Result Value Ref Range Status   Specimen Description BLOOD RIGHT HAND  Final   Special Requests IN PEDIATRIC BOTTLE 5CC  Final   Culture  Setup Time   Final    GRAM NEGATIVE RODS AEROBIC BOTTLE ONLY CRITICAL RESULT CALLED TO, READ BACK BY AND VERIFIED WITH: MELANIE SHACKLETT,RN  07.06.16 1959 M SHIPMAN CONFIRMED BY A BROWNING    Culture GRAM NEGATIVE RODS  Final   Report Status PENDING  Incomplete  Clostridium Difficile by PCR (not at [facility])     Status: None   Collection Time: 04/04/15  6:41 AM  Result Value Ref Range Status   C difficile by pcr NEGATIVE NEGATIVE Final    Radiology Reports Dg Chest Port 1 View  04/03/2015   CLINICAL DATA:  Wounds on the buttock area.  Evaluate for SVT.  EXAM: PORTABLE CHEST - 1 VIEW  COMPARISON:  09/04/2014  FINDINGS: Again noted is a right jugular Port-A-Cath. A catheter tip is more midline than expected but this may be related to patient positioning. Tip is likely in the lower SVC region. Atherosclerotic calcifications at the aortic arch. Heart size is within normal limits.  There is a bandlike density overlying the left cardiac apex which probably represents overlying soft tissues. Otherwise, the lungs are clear. Degenerative changes in thoracic spine.  IMPRESSION: No acute chest findings.   Electronically Signed   By: Markus Daft M.D.   On: 04/03/2015 15:22     CBC  Recent Labs Lab 04/03/15 1523 04/04/15 0345 04/04/15 1858 04/05/15 1100  WBC 6.7 6.1 9.2 2.5*  HGB 8.0* 6.7* 9.3* 11.8*  HCT 24.2* 20.4* 27.4* 36.1  PLT 73* 59* 46* 23*  MCV 98.4 99.5 95.5 93.5  MCH 32.5 32.7 32.4 30.6  MCHC 33.1 32.8 33.9 32.7  RDW NOT CALCULATED 23.2* 23.4* 23.2*  LYMPHSABS  --   --  8.4*  --   MONOABS  --   --  0.4  --   EOSABS  --   --  0.0  --   BASOSABS  --   --  0.0  --     Chemistries   Recent Labs Lab 04/03/15 1523 04/04/15 0345 04/05/15 0328  NA 130* 133* 132*  K 3.3* 4.2 3.6  CL 96* 102 103  CO2 20* 20* 23  GLUCOSE 168* 132* 138*  BUN 27* 25* 23*  CREATININE 1.53* 1.21* 1.06*  CALCIUM 8.3* 8.0* 7.8*   ------------------------------------------------------------------------------------------------------------------ estimated creatinine clearance is 45.4 mL/min (by C-G formula based on Cr of 1.06). ------------------------------------------------------------------------------------------------------------------ No results for input(s): HGBA1C in the last 72 hours. ------------------------------------------------------------------------------------------------------------------ No results for input(s): CHOL, HDL, LDLCALC, TRIG, CHOLHDL, LDLDIRECT in the last 72 hours. ------------------------------------------------------------------------------------------------------------------  Recent Labs  04/04/15 0345  TSH 0.558   ------------------------------------------------------------------------------------------------------------------ No results for input(s): VITAMINB12, FOLATE, FERRITIN, TIBC, IRON, RETICCTPCT in the last 72  hours.  Coagulation profile No results for input(s): INR, PROTIME in the last 168 hours.  No results for input(s): DDIMER in the last 72 hours.  Cardiac Enzymes No results for input(s): CKMB, TROPONINI, MYOGLOBIN in the last 168 hours.  Invalid input(s): CK ------------------------------------------------------------------------------------------------------------------ Invalid input(s): POCBNP     Time Spent in  minutes   30 minutes   Tavares Levinson M.D on 04/05/2015 at 1:38 PM  Between 7am to 7pm - Pager - [phone]  After 7pm go to [email or web address] - password [facility]  [facility]   Office  [phone]

## 2015-04-05 NOTE — Progress Notes (Signed)
IP PROGRESS NOTE  Subjective:   She was admitted 04/03/2015 with generalized weakness and dehydration. She complains of pain associated with skin ulcers. She feels "weak ".  Objective: Vital signs in last 24 hours: Blood pressure 121/51, pulse 59, temperature 98.6 F (37 C), temperature source Oral, resp. rate 17, height 5' 3" (1.6 m), weight 194 lb 12.8 oz (88.361 kg), SpO2 100 %.  Intake/Output from previous day: 07/06 0701 - 07/07 0700 In: 1400 [P.O.:1020; Blood:330; IV Piggyback:50] Out: 300 [Urine:300]  Physical Exam:  HEENT: Neck without mass Lungs: Clear anteriorly Cardiac: Regular rate rhythm Abdomen: No hepato-splenomegaly Extremities: No leg edema Lymph nodes: 2 cm left axillary nodes Skin: Skin breakdown with superficial ulceration in the right groin, gluteal fold, and labia  Portacath/PICC-without erythema  Lab Results:  Recent Labs  04/04/15 0345 04/04/15 1858  WBC 6.1 9.2  HGB 6.7* 9.3*  HCT 20.4* 27.4*  PLT 59* 46*    BMET  Recent Labs  04/04/15 0345 04/05/15 0328  NA 133* 132*  K 4.2 3.6  CL 102 103  CO2 20* 23  GLUCOSE 132* 138*  BUN 25* 23*  CREATININE 1.21* 1.06*  CALCIUM 8.0* 7.8*    Studies/Results: Dg Chest Port 1 View  04/03/2015   CLINICAL DATA:  Wounds on the buttock area.  Evaluate for SVT.  EXAM: PORTABLE CHEST - 1 VIEW  COMPARISON:  09/04/2014  FINDINGS: Again noted is a right jugular Port-A-Cath. A catheter tip is more midline than expected but this may be related to patient positioning. Tip is likely in the lower SVC region. Atherosclerotic calcifications at the aortic arch. Heart size is within normal limits. There is a bandlike density overlying the left cardiac apex which probably represents overlying soft tissues. Otherwise, the lungs are clear. Degenerative changes in thoracic spine.  IMPRESSION: No acute chest findings.   Electronically Signed   By: Markus Daft M.D.   On: 04/03/2015 15:22    Medications: I have reviewed  the patient's current medications.  Assessment/Plan:  1. Refractory chronic lymphocytic leukemia 2. Pancytopenia secondary to #1 3. Left breast cancer January 2012, status post a left mastectomy, maintained on tamoxifen 4. Multiple areas of skin ulceration-infectious? 5. CVA in 2010 6. Fever 04/04/2015-blood culture positive for gram-negative rods  Ms. Trower has advanced stage refractory chronic lymphocytic ischemia. A bone marrow biopsy at [facility] 03/26/2015 confirmed involvement with chronic lymphocytic leukemia. This explains the refractory pancytopenia. Ms. Hutmacher has a poor performance status and the CLL has been refractory to multiple therapies. I discussed the case with Dr. Lissa Merlin at [facility] last week. We agree the best treatment recommendation is to proceed with comfort/supportive care. I discussed skilled nursing facility placement and Hospice care with Ms. Terry today.  She has painful ulcerations in multiple areas, initially felt to be pressure ulcers. However I am concerned these could be infectious based on the distribution and persistence.  Recommendations: 1. Narcotic analgesics for pain 2. Care management consult to review placement options and Hospice care 3. Transfusion for symptomatic anemia 4. Continue antibiotics and follow-up ID/sensitivity of gram-negative rod   Her daughter was not present this morning. I will check on this [place] in the a.m. on 04/06/2015.   LOS: 2 days   Darragh Nay  04/05/2015, 8:46 AM

## 2015-04-05 NOTE — Progress Notes (Signed)
During change of shift pt was in room crying for pain but refused her vicodin per outgoing RN; pt said it makes her sick and want something different; Dr. Landis Gandy notified and new order received for percocet. Will closely monitor pt. Francis Gaines Dyesha Henault RN.

## 2015-04-05 NOTE — Telephone Encounter (Signed)
Family member was not present during MD [place] visit to see patient at the hospital. Family member would like for MD Sherrill to call her concerning anything that was discussed at the visit. Phone number is [phone]. Message sent to MD Sherrill/RN Ubaldo Glassing

## 2015-04-05 NOTE — Evaluation (Signed)
Physical Therapy Evaluation Patient Details Name: [name] MRN: [id number] DOB: Apr 13, 1934 Today's Date: 04/05/2015   History of Present Illness  Pt is a 81 y/o female with a PMH of CLL undergoing chemo, stroke with mild R-sided residual weakness. Pt was living with a daughter PTA and per family was not getting around well. Using a walker for minimal ambulation. Pt has had 2 stage II to stage III ulcers related to chemotherapy, 1 midline in between buttocks and second in her vulvovaginal area. She has been going to a wound care clinic as an outpatient. Pt [facility] the [facility] via ambulance as pt was feeling very weak and was noted to have runs of SVT. Pt was admitted for further management.   Clinical Impression  Pt admitted with above diagnosis. Pt currently with functional limitations due to the deficits listed below (see PT Problem List). At the time of PT eval pt was able to tolerate bed mobility only due to pain from wounds. Pt was able to complete rolling tasks with increased time and use of bed rails for support. Therapist positioned pt on R side with pillows between knees and behind back for comfort. NT was notified of position change time, and was also written on board for staff. Pt will benefit from skilled PT to increase their independence and safety with mobility to allow discharge to the venue listed below.    Of note, family concerned with pt sitting up in recliner due to wounds. Per family, wound care center advised against a donut cushion and did not offer any other type of cushion for pressure relief. This therapist discussed possible benefits of an air cushion with RN who states she will discuss with MD for appropriateness to order.     Follow Up Recommendations SNF;Supervision/Assistance - 24 hour    Equipment Recommendations  None recommended by PT    Recommendations for Other Services       Precautions / Restrictions Precautions Precautions: Fall Precaution Comments:  Multiple wounds. 1 on buttocks, 1 in vulvovaginal area, 1 under R armpit Restrictions Weight Bearing Restrictions: No      Mobility  Bed Mobility Overal bed mobility: Needs Assistance Bed Mobility: Rolling Rolling: Min guard         General bed mobility comments: Pt with difficulty reaching with the RUE to the L bed rail to roll L, however with increased time was able to complete. Pt was able to roll easier to the R side however increased time was also required.   Transfers                 General transfer comment: Pt was too painful to attempt further mobility at this time.   Ambulation/Gait                Stairs            Wheelchair Mobility    Modified Rankin (Stroke Patients Only)       Balance Overall balance assessment:  (NT)                                           Pertinent Vitals/Pain Pain Assessment: Faces Faces Pain Scale: Hurts even more Pain Location: Back side during bed mobility due to ulcer Pain Descriptors / Indicators: Tender;Discomfort Pain Intervention(s): Limited activity within patient's tolerance;Monitored during session;Repositioned    Home Living Family/patient expects  to be discharged to:: [place]: Children;Other relatives                    Prior Function Level of Independence: Needs assistance   Gait / Transfers Assistance Needed: Per family using RW all the time and having difficulty even with short distances  ADL's / Homemaking Assistance Needed: Per family assist for IADL's        Hand Dominance        Extremity/Trunk Assessment   Upper Extremity Assessment: RUE deficits/detail RUE Deficits / Details: Noted decreased AROM. Per chart review residual R-sided weakness from prior stroke, however family states there is a wound on the back side of her armpit that may have limited ROM as well.          Lower Extremity Assessment: Generalized  weakness (Painful to attempt range and strength testing due to ulcers)      Cervical / Trunk Assessment: Kyphotic  Communication   Communication: No difficulties  Cognition Arousal/Alertness: Awake/alert Behavior During Therapy: WFL for tasks assessed/performed Overall Cognitive Status: History of cognitive impairments - at baseline (Per chart review mild dementia)                      General Comments      Exercises        Assessment/Plan    PT Assessment Patient needs continued PT services  PT Diagnosis Difficulty walking;Generalized weakness   PT Problem List Decreased strength;Decreased range of motion;Decreased activity tolerance;Decreased balance;Decreased mobility;Decreased knowledge of use of DME;Decreased safety awareness;Decreased knowledge of precautions;Pain;Decreased skin integrity  PT Treatment Interventions DME instruction;Gait training;Functional mobility training;Therapeutic activities;Therapeutic exercise;Neuromuscular re-education;Patient/family education   PT Goals (Current goals can be found in the Care Plan section) Acute Rehab PT Goals Patient Stated Goal: Decrease pain from ulcers PT Goal Formulation: With patient/family Time For Goal Achievement: 04/19/15 Potential to Achieve Goals: Good    Frequency Min 2X/week   Barriers to discharge        Co-evaluation               End of Session   Activity Tolerance: Patient limited by pain Patient left: in bed;with call bell/phone within reach;with family/visitor present Nurse Communication: Mobility status;Other (comment) (Time pt was positioned on R side - also wrote on board)         Time: 3893-7342 PT Time Calculation (min) (ACUTE ONLY): 32 min   Charges:   PT Evaluation $Initial PT Evaluation Tier I: 1 Procedure PT Treatments $Therapeutic Activity: 8-22 mins   PT G Codes:        Rolinda Roan 27-Apr-2015, 10:14 AM   Rolinda Roan, PT, DPT Acute Rehabilitation  Services Pager: [phone]

## 2015-04-05 NOTE — Progress Notes (Signed)
Page Tylene Fantasia to let her know that patient blood cul came back for gram ne. Rods. Unaware it was a critical value. Patient is on antio. Already. R.N. aware

## 2015-04-05 NOTE — Progress Notes (Signed)
New foley inserted as ordered with assistance from the nursing tech. Francis Gaines Lorissa Kishbaugh RN.

## 2015-04-05 NOTE — Clinical Social Work Note (Signed)
CSW completed FL2 and DNR form on patient's chart awaiting signature for physician.  Patient has been faxed out awaiting bed offers, per family's request they would like a SNF in [facility] area since patient and family live in the city.  CSW to continue to follow patient's discharge planning.  Jones Broom. Vincent, MSW, [place] 04/05/2015 10:06 AM

## 2015-04-05 NOTE — Clinical Social Work Note (Signed)
CSW contacted patient's daughter Ennis Forts regarding SNF bed offers.  Patient's daughter is going to discuss with the family which facility they would like her to go to.  Patient's family would like a SNF in [place], but they are open to [place] facilities.  Patient's daughter will contact CSW with choice tomorrow, so CSW can continue to follow patient's progress throughout discharge planning.  Jones Broom. [place], MSW, [place] 04/05/2015 5:03 PM

## 2015-04-05 NOTE — Progress Notes (Signed)
Lab calls with critical value for plt 23; Dr. Landis Gandy notified and no new orders received. Francis Gaines Ilse Billman RN.

## 2015-04-05 NOTE — Progress Notes (Signed)
Per infection control pt C-dif came back negative and ok to discontinue precaution. Enteric precaution dc. P. Amo Melony Tenpas RN.

## 2015-04-06 LAB — CBC WITH DIFFERENTIAL/PLATELET
BASOS PCT: 0 % (ref 0–1)
BLASTS: 0 %
Band Neutrophils: 2 % (ref 0–10)
Basophils Absolute: 0 10*3/uL (ref 0.0–0.1)
EOS ABS: 0.1 10*3/uL (ref 0.0–0.7)
Eosinophils Relative: 2 % (ref 0–5)
HCT: 25.7 % — ABNORMAL LOW (ref 36.0–46.0)
Hemoglobin: 8.8 g/dL — ABNORMAL LOW (ref 12.0–15.0)
LYMPHS PCT: 91 % — AB (ref 12–46)
Lymphs Abs: 3.9 10*3/uL (ref 0.7–4.0)
MCH: 31.9 pg (ref 26.0–34.0)
MCHC: 34.2 g/dL (ref 30.0–36.0)
MCV: 93.1 fL (ref 78.0–100.0)
Metamyelocytes Relative: 0 %
Monocytes Absolute: 0.1 10*3/uL (ref 0.1–1.0)
Monocytes Relative: 2 % — ABNORMAL LOW (ref 3–12)
Myelocytes: 0 %
NEUTROS ABS: 0.2 10*3/uL — AB (ref 1.7–7.7)
Neutrophils Relative %: 3 % — ABNORMAL LOW (ref 43–77)
Other: 0 %
Platelets: 31 10*3/uL — ABNORMAL LOW (ref 150–400)
Promyelocytes Absolute: 0 %
RBC: 2.76 MIL/uL — ABNORMAL LOW (ref 3.87–5.11)
RDW: 22.4 % — ABNORMAL HIGH (ref 11.5–15.5)
Smear Review: DECREASED
WBC: 4.3 10*3/uL (ref 4.0–10.5)
nRBC: 0 /100 WBC

## 2015-04-06 LAB — BASIC METABOLIC PANEL
Anion gap: 7 (ref 5–15)
BUN: 21 mg/dL — AB (ref 6–20)
CHLORIDE: 101 mmol/L (ref 101–111)
CO2: 25 mmol/L (ref 22–32)
CREATININE: 0.85 mg/dL (ref 0.44–1.00)
Calcium: 8 mg/dL — ABNORMAL LOW (ref 8.9–10.3)
GFR calc Af Amer: >60 mL/min (ref >60)
Glucose, Bld: 103 mg/dL — ABNORMAL HIGH (ref 65–99)
POTASSIUM: 3.6 mmol/L (ref 3.5–5.1)
Sodium: 133 mmol/L — ABNORMAL LOW (ref 135–145)

## 2015-04-06 LAB — PATHOLOGIST SMEAR REVIEW

## 2015-04-06 MED ORDER — RISAQUAD PO CAPS
2.0000 | ORAL_CAPSULE | Freq: Every day | ORAL | Status: DC
  Administered 2015-04-06 – 2015-04-08 (×3): 2 via ORAL
  Filled 2015-04-06 (×3): qty 2

## 2015-04-06 MED ORDER — OXYCODONE-ACETAMINOPHEN 5-325 MG PO TABS
1.0000 | ORAL_TABLET | ORAL | Status: DC | PRN
  Administered 2015-04-06 – 2015-04-08 (×5): 1 via ORAL
  Filled 2015-04-06 (×7): qty 1

## 2015-04-06 MED ORDER — SODIUM CHLORIDE 0.9 % IJ SOLN
10.0000 mL | INTRAMUSCULAR | Status: DC | PRN
  Administered 2015-04-07 – 2015-04-08 (×3): 10 mL
  Filled 2015-04-06 (×3): qty 40

## 2015-04-06 MED ORDER — VALACYCLOVIR HCL 500 MG PO TABS
1000.0000 mg | ORAL_TABLET | Freq: Three times a day (TID) | ORAL | Status: DC
  Administered 2015-04-06 – 2015-04-08 (×6): 1000 mg via ORAL
  Filled 2015-04-06 (×8): qty 2

## 2015-04-06 MED ORDER — BACITRACIN ZINC 500 UNIT/GM EX OINT
TOPICAL_OINTMENT | Freq: Two times a day (BID) | CUTANEOUS | Status: DC
  Administered 2015-04-06 – 2015-04-07 (×3): via TOPICAL
  Administered 2015-04-08: 15.5556 via TOPICAL
  Filled 2015-04-06 (×2): qty 28.35

## 2015-04-06 NOTE — Progress Notes (Signed)
Patient Demographics  [name], is a 81 y.o. female, DOB - [birth year], QAS:[id number]  Admit date - 04/03/2015   Admitting Physician Thurnell Lose, MD  Outpatient Primary MD for the patient is Gwendolyn Grant, MD  LOS - 3   Chief Complaint  Patient presents with  . Tachycardia       Admission HPI/Brief narrative: 81 y.o. female, history of CLL undergoing chemotherapy , stroke with mild right-sided weakness, generalized weakness ambulates with a walker and lives with her daughter, breast cancer status post left mastectomy, essential hypertension, anemia of chronic disease, early dementia who's been having cancer/chemotherapy related skin ulceration since April, this has been followed by dermatology outpatient and biopsied. She has 2 stage II to 3 deep ulcers 1 midline in between her buttocks and second in her vulvovaginal area, presents from 1 clinic secondary to diarrhea, noticed to have SVT and ambulance.  Subjective:   [name] today has, No headache, No chest pain, No abdominal pain - No Nausea,  No Cough , patient reports generalized weakness and shortness of breath, no further diarrhea.  Assessment & Plan    Principal Problem:   Tachycardia Active Problems:   Breast cancer left T2N0 S/P mastectomy and SLN Bx 09/2010   Benign essential HTN   Hyperlipidemia   CLL (chronic lymphocytic leukemia)   SVT (supraventricular tachycardia)  SVT - Most likely related to severe dehydration, and anemia, no recurrent since admission, will DC telemetry monitor. - Patient is on atenolol.  Pancytopenia in the setting of CLL -  Oncology consult greatly appreciated, has advanced stage refractory chronic lymphocytic leukemia, biopsy at [facility] 6/27 confirmed chronic lymphocytic leukemia. - Transfused 2 units packed red blood cells for her anemia 04/04/15. - Worsening thrombocytopenia in the setting of  sepsis, and advanced CLL, continue to monitor, no indication for transfusion at this point. - Recommendation to continue with comfort/supportive care, hospice service consulted. - Hold chemical anticoagulation given her thrombocytopenia.  Neutropenic fever/gram-negative rods bacteremia - Initially on broad-spectrum IV antibiotics vancomycin and Fortaz, will DC vancomycin 7/8, as her workup significant for gram-negative rods bacteremia.. - Unclear source of gram-negative bacteremia, negative urinalysis  Diarrhea - C. difficile by PCR is negative, no further diarrhea ,stopped oral vancomycin, continue with probiotics.  Skin ulcers - Continue with wound care, will insert Foley catheter. does look infected, with no discharge, but she is on broad-spectrum IV antibiotics, unclear if related to viral origin, but she is on Acyclovir prophylactic dose already.  Dementia - Continue with Aricept  Hypertension - Continue with atenolol  Acute on chronic renal disease - Baseline creatinine 1.2, creatinine is 1.5 on admission, back to baseline after hydration.   Code Status: DO NOT RESUSCITATE, overall very poor prognosis this message was relayed to daughter.  Family Communication: Discussed with daughter at bedside  Disposition Plan: Hopefully in 2-3 days once further data is obtained from blood cultures and we have clear idea about antibiotics decision on discharge, the plan is for discharge to a facility with hospice care.   Procedures  None   Consults   oncology   Medications  Scheduled Meds: . sodium chloride   Intravenous Once  . acyclovir  400 mg Oral Daily  . atenolol  50  mg Oral QHS  . bacitracin   Topical BID  . cefTAZidime (FORTAZ)  IV  2 g Intravenous Q12H  . donepezil  10 mg Oral QHS  . feeding supplement (ENSURE ENLIVE)  237 mL Oral BID BM  . sodium chloride  3 mL Intravenous Q12H   Continuous Infusions:  PRN Meds:.guaiFENesin-dextromethorphan, morphine, ondansetron  **OR** ondansetron (ZOFRAN) IV, oxyCODONE-acetaminophen, sodium chloride, sodium chloride  DVT Prophylaxis   SCDs , no chemical anticoagulation given her thrombocytopenia  Lab Results  Component Value Date   PLT 31* 04/06/2015    Antibiotics   Anti-infectives    Start     Dose/Rate Route Frequency Ordered Stop   04/05/15 1700  vancomycin (VANCOCIN) 1,250 mg in sodium chloride 0.9 % 250 mL IVPB  Status:  Discontinued     1,250 mg 166.7 mL/hr over 90 Minutes Intravenous Every 24 hours 04/04/15 1537 04/05/15 1354   04/04/15 1800  vancomycin (VANCOCIN) 50 mg/mL oral solution 250 mg  Status:  Discontinued     250 mg Oral 4 times per day 04/04/15 1527 04/06/15 1002   04/04/15 1545  cefTAZidime (FORTAZ) 2 g in dextrose 5 % 50 mL IVPB     2 g 100 mL/hr over 30 Minutes Intravenous Every 12 hours 04/04/15 1535     04/04/15 1545  vancomycin (VANCOCIN) 1,500 mg in sodium chloride 0.9 % 500 mL IVPB     1,500 mg 250 mL/hr over 120 Minutes Intravenous  Once 04/04/15 1535 04/04/15 2006   04/03/15 2100  acyclovir (ZOVIRAX) tablet 400 mg     400 mg Oral Daily 04/03/15 2005     04/03/15 1915  vancomycin (VANCOCIN) 50 mg/mL oral solution 250 mg  Status:  Discontinued     250 mg Oral 4 times daily 04/03/15 1904 04/04/15 0829   04/03/15 1845  vancomycin (VANCOCIN) 50 mg/mL oral solution 250 mg  Status:  Discontinued     250 mg Oral 4 times per day 04/03/15 1843 04/03/15 1903          Objective:   Filed Vitals:   04/05/15 1335 04/05/15 2039 04/05/15 2308 04/06/15 0605  BP: 102/43 105/48 116/50 136/50  Pulse: 60 59 60 57  Temp: 97.4 F (36.3 C) 98.7 F (37.1 C)  98.7 F (37.1 C)  TempSrc: Oral Oral  Oral  Resp: 17 16  18   Height:      Weight:      SpO2: 99% 99% 98% 98%    Wt Readings from Last 3 Encounters:  04/05/15 88.361 kg (194 lb 12.8 oz)  03/28/15 86.07 kg (189 lb 12 oz)  03/13/15 88.451 kg (195 lb)     Intake/Output Summary (Last 24 hours) at 04/06/15 1112 Last data filed  at 04/06/15 0800  Gross per 24 hour  Intake    340 ml  Output    700 ml  Net   -360 ml     Physical Exam  Awake Alert, Oriented . [place].AT,PERRAL Supple Neck,No JVD, No cervical lymphadenopathy appriciated.  Symmetrical Chest wall movement, decreased air movement bilaterally,  RRR,No Gallops,Rubs or new Murmurs, No Parasternal Heave +ve B.Sounds, Abd Soft,mild tenderness to palpation, No organomegaly appriciated, No rebound - guarding or rigidity. No Cyanosis,  pale, Clubbing or edema,has ulcers extending from right groin,  buttocks and vulvovaginal area. Furuncle in the right axilla , no discharge.   Data Review   Micro Results Recent Results (from the past 240 hour(s))  TECHNOLOGIST REVIEW     Status: None  Collection Time: 03/28/15 12:43 PM  Result Value Ref Range Status   Technologist Review Variant lymphs present  Final  Culture, blood (routine x 2)     Status: None (Preliminary result)   Collection Time: 04/03/15  6:44 PM  Result Value Ref Range Status   Specimen Description BLOOD RIGHT ANTECUBITAL  Final   Special Requests BOTTLES DRAWN AEROBIC ONLY 2CC  Final   Culture NO GROWTH 2 DAYS  Final   Report Status PENDING  Incomplete  Culture, blood (routine x 2)     Status: None (Preliminary result)   Collection Time: 04/04/15 12:20 AM  Result Value Ref Range Status   Specimen Description BLOOD RIGHT HAND  Final   Special Requests IN PEDIATRIC BOTTLE 5CC  Final   Culture  Setup Time   Final    GRAM NEGATIVE RODS AEROBIC BOTTLE ONLY CRITICAL RESULT CALLED TO, READ BACK BY AND VERIFIED WITH: MELANIE SHACKLETT,RN  07.06.16 1959 M SHIPMAN CONFIRMED BY A BROWNING    Culture GRAM NEGATIVE RODS  Final   Report Status PENDING  Incomplete  Clostridium Difficile by PCR (not at [facility])     Status: None   Collection Time: 04/04/15  6:41 AM  Result Value Ref Range Status   C difficile by pcr NEGATIVE NEGATIVE Final    Radiology Reports Dg Chest Port 1 View  04/03/2015    CLINICAL DATA:  Wounds on the buttock area.  Evaluate for SVT.  EXAM: PORTABLE CHEST - 1 VIEW  COMPARISON:  09/04/2014  FINDINGS: Again noted is a right jugular Port-A-Cath. A catheter tip is more midline than expected but this may be related to patient positioning. Tip is likely in the lower SVC region. Atherosclerotic calcifications at the aortic arch. Heart size is within normal limits. There is a bandlike density overlying the left cardiac apex which probably represents overlying soft tissues. Otherwise, the lungs are clear. Degenerative changes in thoracic spine.  IMPRESSION: No acute chest findings.   Electronically Signed   By: Markus Daft M.D.   On: 04/03/2015 15:22     CBC  Recent Labs Lab 04/03/15 1523 04/04/15 0345 04/04/15 1858 04/05/15 1100 04/06/15 0405  WBC 6.7 6.1 9.2 2.5* 4.3  HGB 8.0* 6.7* 9.3* 11.8* 8.8*  HCT 24.2* 20.4* 27.4* 36.1 25.7*  PLT 73* 59* 46* 23* 31*  MCV 98.4 99.5 95.5 93.5 93.1  MCH 32.5 32.7 32.4 30.6 31.9  MCHC 33.1 32.8 33.9 32.7 34.2  RDW NOT CALCULATED 23.2* 23.4* 23.2* 22.4*  LYMPHSABS  --   --  8.4*  --  3.9  MONOABS  --   --  0.4  --  0.1  EOSABS  --   --  0.0  --  0.1  BASOSABS  --   --  0.0  --  0.0    Chemistries   Recent Labs Lab 04/03/15 1523 04/04/15 0345 04/05/15 0328 04/06/15 0405  NA 130* 133* 132* 133*  K 3.3* 4.2 3.6 3.6  CL 96* 102 103 101  CO2 20* 20* 23 25  GLUCOSE 168* 132* 138* 103*  BUN 27* 25* 23* 21*  CREATININE 1.53* 1.21* 1.06* 0.85  CALCIUM 8.3* 8.0* 7.8* 8.0*   ------------------------------------------------------------------------------------------------------------------ estimated creatinine clearance is 56.6 mL/min (by C-G formula based on Cr of 0.85). ------------------------------------------------------------------------------------------------------------------ No results for input(s): HGBA1C in the last 72  hours. ------------------------------------------------------------------------------------------------------------------ No results for input(s): CHOL, HDL, LDLCALC, TRIG, CHOLHDL, LDLDIRECT in the last 72 hours. ------------------------------------------------------------------------------------------------------------------  Recent Labs  04/04/15 0345  TSH  0.558   ------------------------------------------------------------------------------------------------------------------ No results for input(s): VITAMINB12, FOLATE, FERRITIN, TIBC, IRON, RETICCTPCT in the last 72 hours.  Coagulation profile No results for input(s): INR, PROTIME in the last 168 hours.  No results for input(s): DDIMER in the last 72 hours.  Cardiac Enzymes No results for input(s): CKMB, TROPONINI, MYOGLOBIN in the last 168 hours.  Invalid input(s): CK ------------------------------------------------------------------------------------------------------------------ Invalid input(s): POCBNP     Time Spent in minutes   30 minutes   Kennidee Heyne M.D on 04/06/2015 at 11:12 AM  Between 7am to 7pm - Pager - [phone]  After 7pm go to [email or web address] - password [facility]  [facility]   Office  [phone]

## 2015-04-06 NOTE — Care Management (Signed)
Important Message  Patient Details  Name: [name] MRN: [id number] Date of Birth: 1934-02-22   Medicare Important Message Given:  Yes-second notification given    Dawayne Patricia, RN 04/06/2015, 11:00 AM

## 2015-04-06 NOTE — Clinical Social Work Note (Signed)
CSW spoke with patient's daughter Edwena Bunde regarding bed offers and the possibility that patient may be ready to discharge over the weekend.  Patient's family stated they would like patient to go to [facility].  CSW contacted [facility] who said they could take patient over the weekend and to have social workers contact Archie at [phone].  Patient's daughter asked questions about transitioning to long term care after rehab, CSW gave daughter information about long term care medicaid and the contact information for Department of Social Services.  Patient's daughter asked questions about insurance paying for SNF placement, and how long she would be approved for.  CSW explained that the financial office at SNF can help her with applying for long term care medicaid, and answer specific fiancial questions about patient's stay.  CSW spoke to [facility] [facility] who gave insurance authorization number of [id number] for 7 days once patient discharges to [facility].  CSW gave patient daughter information on how to complete [facility]), and explained what HCPOA can do.  CSW encouraged patient and daughter to discuss setting up HCPOA, patient's daughter stated she will ask to speak with the social worker about HCPOA next time she is in hospital.  CSW to continue to follow patient's progress throughout discharge planning, FL2 and DNR on chart awaiting signature of physician.   Jones Broom. [place], MSW, [place] 04/06/2015 3:47 PM

## 2015-04-06 NOTE — Progress Notes (Signed)
ANTIBIOTIC CONSULT NOTE - FOLLOW UP  Pharmacy Consult for Ceftazidime Indication: Neutropenic fever / GNR bacteremia  Allergies  Allergen Reactions  . Obinutuzumab Shortness Of Breath and Rash  . Penicillins Anaphylaxis, Shortness Of Breath, Itching, Swelling and Rash    Swelling all over body and throat   . Latex Itching    Patient Measurements: Height: 5\' 3"  (160 cm) Weight: 194 lb 12.8 oz (88.361 kg) IBW/kg (Calculated) : 52.4  Vital Signs: Temp: 98.7 F (37.1 C) (07/08 0605) Temp Source: Oral (07/08 0605) BP: 136/50 mmHg (07/08 0605) Pulse Rate: 57 (07/08 0605) Intake/Output from previous day: 07/07 0701 - 07/08 0700 In: 120 [P.O.:120] Out: 700 [Urine:700] Intake/Output from this shift:    Labs:  Recent Labs  04/04/15 0345 04/04/15 1858 04/05/15 0328 04/05/15 1100 04/06/15 0405  WBC 6.1 9.2  --  2.5* 4.3  HGB 6.7* 9.3*  --  11.8* 8.8*  PLT 59* 46*  --  23* 31*  CREATININE 1.21*  --  1.06*  --  0.85   Estimated Creatinine Clearance: 56.6 mL/min (by C-G formula based on Cr of 0.85). No results for input(s): VANCOTROUGH, VANCOPEAK, VANCORANDOM, GENTTROUGH, GENTPEAK, GENTRANDOM, TOBRATROUGH, TOBRAPEAK, TOBRARND, AMIKACINPEAK, AMIKACINTROU, AMIKACIN in the last 72 hours.   Microbiology: Recent Results (from the past 720 hour(s))  TECHNOLOGIST REVIEW     Status: None   Collection Time: 03/21/15 10:38 AM  Result Value Ref Range Status   Technologist Review   Final    Few Variant lymphs present. few teardrops & ovalos. 1 nrbc  Culture, blood (routine x 2)     Status: None   Collection Time: 03/21/15  7:02 PM  Result Value Ref Range Status   Specimen Description BLOOD RIGHT ANTECUBITAL  Final   Special Requests BOTTLES DRAWN AEROBIC AND ANAEROBIC 2ML,3ML  Final   Culture   Final    NO GROWTH 5 DAYS Performed at [facility]    Report Status 03/26/2015 FINAL  Final  Culture, blood (routine x 2)     Status: None   Collection Time: 03/21/15  7:25 PM   Result Value Ref Range Status   Specimen Description BLOOD RIGHT CHEST  Final   Special Requests BOTTLES DRAWN AEROBIC AND ANAEROBIC 5ML  Final   Culture   Final    NO GROWTH 5 DAYS Performed at [facility]    Report Status 03/26/2015 FINAL  Final  Urine culture     Status: None   Collection Time: 03/21/15 10:06 PM  Result Value Ref Range Status   Specimen Description URINE, CLEAN CATCH  Final   Special Requests NONE  Final   Culture   Final    MULTIPLE SPECIES PRESENT, SUGGEST RECOLLECTION IF CLINICALLY INDICATED Performed at [facility]    Report Status 03/23/2015 FINAL  Final  TECHNOLOGIST REVIEW     Status: None   Collection Time: 03/28/15 12:43 PM  Result Value Ref Range Status   Technologist Review Variant lymphs present  Final  Culture, blood (routine x 2)     Status: None (Preliminary result)   Collection Time: 04/03/15  6:44 PM  Result Value Ref Range Status   Specimen Description BLOOD RIGHT ANTECUBITAL  Final   Special Requests BOTTLES DRAWN AEROBIC ONLY 2CC  Final   Culture NO GROWTH 2 DAYS  Final   Report Status PENDING  Incomplete  Culture, blood (routine x 2)     Status: None (Preliminary result)   Collection Time: 04/04/15 12:20 AM  Result  Value Ref Range Status   Specimen Description BLOOD RIGHT HAND  Final   Special Requests IN PEDIATRIC BOTTLE 5CC  Final   Culture  Setup Time   Final    GRAM NEGATIVE RODS AEROBIC BOTTLE ONLY CRITICAL RESULT CALLED TO, READ BACK BY AND VERIFIED WITH: MELANIE SHACKLETT,RN  07.06.16 1959 M SHIPMAN CONFIRMED BY A BROWNING    Culture GRAM NEGATIVE RODS  Final   Report Status PENDING  Incomplete  Clostridium Difficile by PCR (not at [facility])     Status: None   Collection Time: 04/04/15  6:41 AM  Result Value Ref Range Status   C difficile by pcr NEGATIVE NEGATIVE Final    Anti-infectives    Start     Dose/Rate Route Frequency Ordered Stop   04/05/15 1700  vancomycin (VANCOCIN) 1,250 mg in sodium chloride  0.9 % 250 mL IVPB  Status:  Discontinued     1,250 mg 166.7 mL/hr over 90 Minutes Intravenous Every 24 hours 04/04/15 1537 04/05/15 1354   04/04/15 1800  vancomycin (VANCOCIN) 50 mg/mL oral solution 250 mg     250 mg Oral 4 times per day 04/04/15 1527     04/04/15 1545  cefTAZidime (FORTAZ) 2 g in dextrose 5 % 50 mL IVPB     2 g 100 mL/hr over 30 Minutes Intravenous Every 12 hours 04/04/15 1535     04/04/15 1545  vancomycin (VANCOCIN) 1,500 mg in sodium chloride 0.9 % 500 mL IVPB     1,500 mg 250 mL/hr over 120 Minutes Intravenous  Once 04/04/15 1535 04/04/15 2006   04/03/15 2100  acyclovir (ZOVIRAX) tablet 400 mg     400 mg Oral Daily 04/03/15 2005     04/03/15 1915  vancomycin (VANCOCIN) 50 mg/mL oral solution 250 mg  Status:  Discontinued     250 mg Oral 4 times daily 04/03/15 1904 04/04/15 0829   04/03/15 1845  vancomycin (VANCOCIN) 50 mg/mL oral solution 250 mg  Status:  Discontinued     250 mg Oral 4 times per day 04/03/15 1843 04/03/15 1903      Assessment: 81 yo F presents on 7/5 with tachycardia. History of CLL undergoing chemotherapy. Day #3 of abx for febrile neutropenia / GNR bacteremia. Blood cx's 1/2 for GNRs. C. Diff was negative. Tmax of 101.4, currently afebrile, WBC wnl. SCr 0.85, CrCl 64ml/min.  Goal of Therapy:  Resolution of infection  Plan:  Continue ceftazidime 2g IV Q12 Monitor clinical picture, renal function F/U C&S, abx LOT  Consider stopping PO vanc  [name] 04/06/2015,8:47 AM

## 2015-04-06 NOTE — Progress Notes (Signed)
IP PROGRESS NOTE  Subjective:  She reports feeling better secondary to improved pain control with IV morphine.  Objective: Vital signs in last 24 hours: Blood pressure 102/44, pulse 56, temperature 98.6 F (37 C), temperature source Oral, resp. rate 18, height 5\' 3"  (1.6 m), weight 194 lb 12.8 oz (88.361 kg), SpO2 99 %.  Intake/Output from previous day: 07/07 0701 - 07/08 0700 In: 220 [P.O.:220] Out: 700 [Urine:700]  Physical Exam:   Abdomen: No hepato-splenomegaly Extremities: No leg edema  Skin: 2-3 cm area of induration with a central clearing in the right axilla, unable to express drainage  Portacath/PICC-without erythema  Lab Results:  Recent Labs  04/05/15 1100 04/06/15 0405  WBC 2.5* 4.3  HGB 11.8* 8.8*  HCT 36.1 25.7*  PLT 23* 31*    BMET  Recent Labs  04/05/15 0328 04/06/15 0405  NA 132* 133*  K 3.6 3.6  CL 103 101  CO2 23 25  GLUCOSE 138* 103*  BUN 23* 21*  CREATININE 1.06* 0.85  CALCIUM 7.8* 8.0*    Studies/Results: No results found.  Medications: I have reviewed the patient's current medications.  Assessment/Plan:  1. Refractory chronic lymphocytic leukemia 2. Pancytopenia secondary to #1 3. Left breast cancer January 2012, status post a left mastectomy, maintained on tamoxifen 4. Multiple areas of skin ulceration-infectious? 5. CVA in 2010 6. Fever 04/04/2015-blood culture positive for pseudomonas aeruginosa  Ms. Vandervort appears improved today. She has advanced stage CLL, persistent severe pancytopenia, and multiple areas of skin ulceration. She is not a candidate for further systemic therapy to treat the CLL. I discussed the situation and disposition plans with Ms. Summerhill and her daughter. She agrees to skilled nursing facility care and a Hospice referral. She confirmed a no CODE BLUE status.  I am concerned the skin ulcers are infectious, potentially related to a viral infection or pseudomonas.  The neutrophil count did not respond to  a recent trial of G-CSF  Recommendations: 1. Narcotic analgesics for pain 2. Hospice referral and social work consult for skilled nursing facility placement 3. Continue IV antibiotics for the pseudomonas bacteremia 4. Agree with plan to administer for dose acyclovir 5. Consider obtaining an infectious disease consult  Please call oncology as needed over the weekend. I will check on her 03/10/2015.   Her daughter was not present this morning. I will check on this [place] in the a.m. on 04/06/2015.   LOS: 3 days   Karlene Southard  04/06/2015, 2:26 PM

## 2015-04-06 NOTE — Clinical Social Work Note (Signed)
CSW received phone call from patient's daughter who stated they were looking at facilities and for short term rehab and will let CSW know before 4:30 today of patient's choice if she is ready to discharge over the weekend.  CSW contacted patient's insurance company to begin authorization process.  CSW awaiting decision by family about patient's decision, CSW continue to follow patient.  Jones Broom. [place], MSW, [place] 04/06/2015 1:41 PM

## 2015-04-07 LAB — BASIC METABOLIC PANEL
Anion gap: 7 (ref 5–15)
BUN: 17 mg/dL (ref 6–20)
CALCIUM: 7.9 mg/dL — AB (ref 8.9–10.3)
CHLORIDE: 100 mmol/L — AB (ref 101–111)
CO2: 24 mmol/L (ref 22–32)
CREATININE: 0.93 mg/dL (ref 0.44–1.00)
GFR calc non Af Amer: 57 mL/min — ABNORMAL LOW (ref >60)
Glucose, Bld: 114 mg/dL — ABNORMAL HIGH (ref 65–99)
Potassium: 3.3 mmol/L — ABNORMAL LOW (ref 3.5–5.1)
Sodium: 131 mmol/L — ABNORMAL LOW (ref 135–145)

## 2015-04-07 LAB — CULTURE, BLOOD (ROUTINE X 2)

## 2015-04-07 LAB — CBC
HEMATOCRIT: 24.8 % — AB (ref 36.0–46.0)
Hemoglobin: 8.4 g/dL — ABNORMAL LOW (ref 12.0–15.0)
MCH: 31.7 pg (ref 26.0–34.0)
MCHC: 33.9 g/dL (ref 30.0–36.0)
MCV: 93.6 fL (ref 78.0–100.0)
Platelets: 33 10*3/uL — ABNORMAL LOW (ref 150–400)
RBC: 2.65 MIL/uL — ABNORMAL LOW (ref 3.87–5.11)
RDW: 22 % — ABNORMAL HIGH (ref 11.5–15.5)
WBC: 3.5 10*3/uL — AB (ref 4.0–10.5)

## 2015-04-07 MED ORDER — SODIUM CHLORIDE 0.9 % IV SOLN
INTRAVENOUS | Status: AC
  Administered 2015-04-07: 18:00:00 via INTRAVENOUS

## 2015-04-07 MED ORDER — SODIUM CHLORIDE 0.9 % IV SOLN: INTRAVENOUS | Status: DC

## 2015-04-07 NOTE — Progress Notes (Signed)
Patient Demographics  [name], is a 81 y.o. female, DOB - Oct 25, 1933, NFA:[id number]  Admit date - 04/03/2015   Admitting Physician Thurnell Lose, MD  Outpatient Primary MD for the patient is Gwendolyn Grant, MD  LOS - 4   Chief Complaint  Patient presents with  . Tachycardia       Admission HPI/Brief narrative: 81 y.o. female, history of CLL undergoing chemotherapy , stroke with mild right-sided weakness, generalized weakness ambulates with a walker and lives with her daughter, breast cancer status post left mastectomy, essential hypertension, anemia of chronic disease, early dementia who's been having cancer/chemotherapy related skin ulceration since April, this has been followed by dermatology outpatient and biopsied. She has 2 stage II to 3 deep ulcers 1 midline in between her buttocks and second in her vulvovaginal area, presents from 1 clinic secondary to diarrhea, noticed to have SVT and ambulance.  Subjective:   [name] today has, No headache, No chest pain, No abdominal pain - No Nausea,  No Cough , patient reports generalized weakness and shortness of breath, no further diarrhea.  Assessment & Plan    Principal Problem:   Tachycardia Active Problems:   Breast cancer left T2N0 S/P mastectomy and SLN Bx 09/2010   Benign essential HTN   Hyperlipidemia   CLL (chronic lymphocytic leukemia)   SVT (supraventricular tachycardia)  SVT - Most likely related to severe dehydration, and anemia, no recurrent since admission, will DC telemetry monitor. - Patient is on atenolol.  Pancytopenia in the setting of CLL -  Oncology consult greatly appreciated, has advanced stage refractory chronic lymphocytic leukemia, biopsy at [facility] 6/27 confirmed chronic lymphocytic leukemia.PLAN FOR HOSPICE. - Transfused 2 units packed red blood cells for her anemia 04/04/15. - Worsening thrombocytopenia in  the setting of sepsis, and advanced CLL, continue to monitor, no indication for transfusion at this point. - Recommendation to continue with comfort/supportive care, hospice service consulted. - Hold chemical anticoagulation given her thrombocytopenia. Platelet gradually improving.  Neutropenic fever/Pseudomonas  bacteremia  - Initially on broad-spectrum IV antibiotics vancomycin and Fortaz, stopped vancomycin 7/8, as her workup significant Pseudomonas bacteremia, continue with IV Tressie Ellis . - Unclear source of gram-negative bacteremia, negative urinalysis -  urine culture growing staph coag-negative   Diarrhea - C. difficile by PCR is negative, no further diarrhea ,stopped oral vancomycin, continue with probiotics.  Skin ulcers - Continue with wound care, will insert Foley catheter. does look infected, with no discharge, but she is on broad-spectrum IV antibiotics, will start empirically on Zovirax 1 g oral 3 times a day, as it might be related to viral origin.  Dementia - Continue with Aricept  Hypertension - Continue with atenolol  Acute on chronic renal disease - Baseline creatinine 1.2, creatinine is 1.5 on admission, back to baseline after hydration.   Code Status: DO NOT RESUSCITATE, overall very poor prognosis this message was relayed to daughter.  Family Communication: Discussed with daughter at bedside  Disposition Plan: Hopefully in 1-2 days to SNF with hospice.  Procedures  None   Consults   oncology   Medications  Scheduled Meds: . sodium chloride   Intravenous Once  . acidophilus  2 capsule Oral Daily  . atenolol  50 mg Oral QHS  .  bacitracin   Topical BID  . cefTAZidime (FORTAZ)  IV  2 g Intravenous Q12H  . donepezil  10 mg Oral QHS  . feeding supplement (ENSURE ENLIVE)  237 mL Oral BID BM  . sodium chloride  3 mL Intravenous Q12H  . valACYclovir  1,000 mg Oral TID   Continuous Infusions: . sodium chloride     PRN Meds:.guaiFENesin-dextromethorphan,  morphine, ondansetron **OR** ondansetron (ZOFRAN) IV, oxyCODONE-acetaminophen, sodium chloride, sodium chloride  DVT Prophylaxis   SCDs , no chemical anticoagulation given her thrombocytopenia  Lab Results  Component Value Date   PLT 33* 04/07/2015    Antibiotics   Anti-infectives    Start     Dose/Rate Route Frequency Ordered Stop   04/06/15 1500  valACYclovir (VALTREX) tablet 1,000 mg     1,000 mg Oral 3 times daily 04/06/15 1453     04/05/15 1700  vancomycin (VANCOCIN) 1,250 mg in sodium chloride 0.9 % 250 mL IVPB  Status:  Discontinued     1,250 mg 166.7 mL/hr over 90 Minutes Intravenous Every 24 hours 04/04/15 1537 04/05/15 1354   04/04/15 1800  vancomycin (VANCOCIN) 50 mg/mL oral solution 250 mg  Status:  Discontinued     250 mg Oral 4 times per day 04/04/15 1527 04/06/15 1002   04/04/15 1545  cefTAZidime (FORTAZ) 2 g in dextrose 5 % 50 mL IVPB     2 g 100 mL/hr over 30 Minutes Intravenous Every 12 hours 04/04/15 1535     04/04/15 1545  vancomycin (VANCOCIN) 1,500 mg in sodium chloride 0.9 % 500 mL IVPB     1,500 mg 250 mL/hr over 120 Minutes Intravenous  Once 04/04/15 1535 04/04/15 2006   04/03/15 2100  acyclovir (ZOVIRAX) tablet 400 mg  Status:  Discontinued     400 mg Oral Daily 04/03/15 2005 04/06/15 1453   04/03/15 1915  vancomycin (VANCOCIN) 50 mg/mL oral solution 250 mg  Status:  Discontinued     250 mg Oral 4 times daily 04/03/15 1904 04/04/15 0829   04/03/15 1845  vancomycin (VANCOCIN) 50 mg/mL oral solution 250 mg  Status:  Discontinued     250 mg Oral 4 times per day 04/03/15 1843 04/03/15 1903          Objective:   Filed Vitals:   04/06/15 0605 04/06/15 1405 04/06/15 2056 04/07/15 0503  BP: 136/50 102/44 128/38 126/61  Pulse: 57 56 63 62  Temp: 98.7 F (37.1 C) 98.6 F (37 C) 99.3 F (37.4 C) 98.5 F (36.9 C)  TempSrc: Oral Oral Oral Oral  Resp: 18 18 18 18   Height:      Weight:    89.585 kg (197 lb 8 oz)  SpO2: 98% 99% 97% 100%    Wt Readings  from Last 3 Encounters:  04/07/15 89.585 kg (197 lb 8 oz)  03/28/15 86.07 kg (189 lb 12 oz)  03/13/15 88.451 kg (195 lb)     Intake/Output Summary (Last 24 hours) at 04/07/15 1225 Last data filed at 04/07/15 0900  Gross per 24 hour  Intake    540 ml  Output    825 ml  Net   -285 ml     Physical Exam  Awake Alert, Oriented . [place].AT,PERRAL Supple Neck,No JVD, No cervical lymphadenopathy appriciated.  Symmetrical Chest wall movement, decreased air movement bilaterally,  RRR,No Gallops,Rubs or new Murmurs, No Parasternal Heave +ve B.Sounds, Abd Soft,mild tenderness to palpation, No organomegaly appriciated, No rebound - guarding or rigidity. No Cyanosis,  pale, Clubbing  or edema,has ulcers extending from right groin,  buttocks and vulvovaginal area. Furuncle in the right axilla , no discharge.   Data Review   Micro Results Recent Results (from the past 240 hour(s))  TECHNOLOGIST REVIEW     Status: None   Collection Time: 03/28/15 12:43 PM  Result Value Ref Range Status   Technologist Review Variant lymphs present  Final  Culture, blood (routine x 2)     Status: None (Preliminary result)   Collection Time: 04/03/15  6:44 PM  Result Value Ref Range Status   Specimen Description BLOOD RIGHT ANTECUBITAL  Final   Special Requests BOTTLES DRAWN AEROBIC ONLY 2CC  Final   Culture NO GROWTH 4 DAYS  Final   Report Status PENDING  Incomplete  Culture, blood (routine x 2)     Status: None   Collection Time: 04/04/15 12:20 AM  Result Value Ref Range Status   Specimen Description BLOOD RIGHT HAND  Final   Special Requests IN PEDIATRIC BOTTLE 5CC  Final   Culture  Setup Time   Final    GRAM NEGATIVE RODS AEROBIC BOTTLE ONLY CRITICAL RESULT CALLED TO, READ BACK BY AND VERIFIED WITH: MELANIE SHACKLETT,RN  07.06.16 [place]  Final   Report Status 04/07/2015 FINAL  Final   Organism ID, Bacteria PSEUDOMONAS AERUGINOSA   Final      Susceptibility   Pseudomonas aeruginosa - MIC*    CEFTAZIDIME 4 SENSITIVE Sensitive     CIPROFLOXACIN 0.5 INTERMEDIATE Intermediate     GENTAMICIN <=1 SENSITIVE Sensitive     IMIPENEM 1 SENSITIVE Sensitive     PIP/TAZO <=4 SENSITIVE Sensitive     CEFEPIME 2 SENSITIVE Sensitive     * PSEUDOMONAS AERUGINOSA  Clostridium Difficile by PCR (not at [facility])     Status: None   Collection Time: 04/04/15  6:41 AM  Result Value Ref Range Status   C difficile by pcr NEGATIVE NEGATIVE Final  Urine culture     Status: None (Preliminary result)   Collection Time: 04/05/15  5:48 AM  Result Value Ref Range Status   Specimen Description URINE, CLEAN CATCH  Final   Special Requests NONE  Final   Culture   Final    >=100,000 COLONIES/mL STAPHYLOCOCCUS SPECIES (COAGULASE NEGATIVE)   Report Status PENDING  Incomplete    Radiology Reports Dg Chest Port 1 View  04/03/2015   CLINICAL DATA:  Wounds on the buttock area.  Evaluate for SVT.  EXAM: PORTABLE CHEST - 1 VIEW  COMPARISON:  09/04/2014  FINDINGS: Again noted is a right jugular Port-A-Cath. A catheter tip is more midline than expected but this may be related to patient positioning. Tip is likely in the lower SVC region. Atherosclerotic calcifications at the aortic arch. Heart size is within normal limits. There is a bandlike density overlying the left cardiac apex which probably represents overlying soft tissues. Otherwise, the lungs are clear. Degenerative changes in thoracic spine.  IMPRESSION: No acute chest findings.   Electronically Signed   By: Markus Daft M.D.   On: 04/03/2015 15:22     CBC  Recent Labs Lab 04/04/15 0345 04/04/15 1858 04/05/15 1100 04/06/15 0405 04/07/15 0454  WBC 6.1 9.2 2.5* 4.3 3.5*  HGB 6.7* 9.3* 11.8* 8.8* 8.4*  HCT 20.4* 27.4* 36.1 25.7* 24.8*  PLT 59* 46* 23* 31* 33*  MCV 99.5 95.5 93.5 93.1 93.6  MCH 32.7 32.4 30.6 31.9 31.7  MCHC 32.8 33.9  32.7 34.2 33.9  RDW 23.2* 23.4* 23.2* 22.4* 22.0*  LYMPHSABS   --  8.4*  --  3.9  --   MONOABS  --  0.4  --  0.1  --   EOSABS  --  0.0  --  0.1  --   BASOSABS  --  0.0  --  0.0  --     Chemistries   Recent Labs Lab 04/03/15 1523 04/04/15 0345 04/05/15 0328 04/06/15 0405 04/07/15 0454  NA 130* 133* 132* 133* 131*  K 3.3* 4.2 3.6 3.6 3.3*  CL 96* 102 103 101 100*  CO2 20* 20* 23 25 24   GLUCOSE 168* 132* 138* 103* 114*  BUN 27* 25* 23* 21* 17  CREATININE 1.53* 1.21* 1.06* 0.85 0.93  CALCIUM 8.3* 8.0* 7.8* 8.0* 7.9*   ------------------------------------------------------------------------------------------------------------------ estimated creatinine clearance is 52.1 mL/min (by C-G formula based on Cr of 0.93). ------------------------------------------------------------------------------------------------------------------ No results for input(s): HGBA1C in the last 72 hours. ------------------------------------------------------------------------------------------------------------------ No results for input(s): CHOL, HDL, LDLCALC, TRIG, CHOLHDL, LDLDIRECT in the last 72 hours. ------------------------------------------------------------------------------------------------------------------ No results for input(s): TSH, T4TOTAL, T3FREE, THYROIDAB in the last 72 hours.  Invalid input(s): FREET3 ------------------------------------------------------------------------------------------------------------------ No results for input(s): VITAMINB12, FOLATE, FERRITIN, TIBC, IRON, RETICCTPCT in the last 72 hours.  Coagulation profile No results for input(s): INR, PROTIME in the last 168 hours.  No results for input(s): DDIMER in the last 72 hours.  Cardiac Enzymes No results for input(s): CKMB, TROPONINI, MYOGLOBIN in the last 168 hours.  Invalid input(s): CK ------------------------------------------------------------------------------------------------------------------ Invalid input(s): POCBNP     Time Spent in minutes   30  minutes   Jaelyne Deeg M.D on 04/07/2015 at 12:25 PM  Between 7am to 7pm - Pager - [phone]  After 7pm go to [email or web address] - password [facility]  [facility]   Office  [phone]

## 2015-04-07 NOTE — Clinical Social Work Note (Signed)
CSW continuing to follow patient's progress. Per MD note, patient will be ready for d/c to SNF in one to two days.  CSW will assist with discharge to SNF when medically stable.  Glendia Olshefski Givens, MSW, LCSW Licensed Clinical Social Worker [place] [phone]

## 2015-04-08 LAB — CBC WITH DIFFERENTIAL/PLATELET
BAND NEUTROPHILS: 0 % (ref 0–10)
BLASTS: 0 %
Basophils Absolute: 0 10*3/uL (ref 0.0–0.1)
Basophils Relative: 0 % (ref 0–1)
EOS PCT: 1 % (ref 0–5)
Eosinophils Absolute: 0 10*3/uL (ref 0.0–0.7)
HEMATOCRIT: 27 % — AB (ref 36.0–46.0)
Hemoglobin: 9.1 g/dL — ABNORMAL LOW (ref 12.0–15.0)
Lymphocytes Relative: 95 % — ABNORMAL HIGH (ref 12–46)
Lymphs Abs: 4.4 10*3/uL — ABNORMAL HIGH (ref 0.7–4.0)
MCH: 32.4 pg (ref 26.0–34.0)
MCHC: 33.7 g/dL (ref 30.0–36.0)
MCV: 96.1 fL (ref 78.0–100.0)
MONO ABS: 0.1 10*3/uL (ref 0.1–1.0)
MONOS PCT: 2 % — AB (ref 3–12)
Metamyelocytes Relative: 0 %
Myelocytes: 0 %
NRBC: 0 /100{WBCs}
Neutro Abs: 0.1 10*3/uL — ABNORMAL LOW (ref 1.7–7.7)
Neutrophils Relative %: 2 % — ABNORMAL LOW (ref 43–77)
Platelets: 38 10*3/uL — ABNORMAL LOW (ref 150–400)
Promyelocytes Absolute: 0 %
RBC: 2.81 MIL/uL — AB (ref 3.87–5.11)
RDW: 21.7 % — AB (ref 11.5–15.5)
WBC: 4.6 10*3/uL (ref 4.0–10.5)

## 2015-04-08 LAB — CULTURE, BLOOD (ROUTINE X 2): Culture: NO GROWTH

## 2015-04-08 MED ORDER — PROCHLORPERAZINE MALEATE 10 MG PO TABS: 10.0000 mg | ORAL_TABLET | Freq: Four times a day (QID) | ORAL | Status: AC | PRN

## 2015-04-08 MED ORDER — GUAIFENESIN-DM 100-10 MG/5ML PO SYRP: 5.0000 mL | ORAL_SOLUTION | ORAL | Status: AC | PRN

## 2015-04-08 MED ORDER — ACYCLOVIR 400 MG PO TABS: 400.0000 mg | ORAL_TABLET | Freq: Every day | ORAL | Status: AC

## 2015-04-08 MED ORDER — DEXTROSE 5 % IV SOLN
2.0000 g | Freq: Three times a day (TID) | INTRAVENOUS | Status: DC
  Administered 2015-04-08: 2 g via INTRAVENOUS
  Filled 2015-04-08 (×3): qty 2

## 2015-04-08 MED ORDER — RISAQUAD PO CAPS: 2.0000 | ORAL_CAPSULE | Freq: Every day | ORAL | Status: AC

## 2015-04-08 MED ORDER — HEPARIN SOD (PORK) LOCK FLUSH 100 UNIT/ML IV SOLN
500.0000 [IU] | INTRAVENOUS | Status: DC | PRN
  Administered 2015-04-08: 500 [IU]
  Filled 2015-04-08 (×2): qty 5

## 2015-04-08 MED ORDER — CIPROFLOXACIN HCL 250 MG PO TABS: 500.0000 mg | ORAL_TABLET | Freq: Two times a day (BID) | ORAL | Status: AC

## 2015-04-08 MED ORDER — ENSURE ENLIVE PO LIQD: 237.0000 mL | Freq: Two times a day (BID) | ORAL | Status: AC

## 2015-04-08 MED ORDER — ONDANSETRON HCL 4 MG PO TABS: 4.0000 mg | ORAL_TABLET | Freq: Four times a day (QID) | ORAL | Status: AC | PRN

## 2015-04-08 MED ORDER — OXYCODONE-ACETAMINOPHEN 5-325 MG PO TABS: 1.0000 | ORAL_TABLET | ORAL | Status: AC | PRN

## 2015-04-08 MED ORDER — VALACYCLOVIR HCL 1 G PO TABS: 1000.0000 mg | ORAL_TABLET | Freq: Three times a day (TID) | ORAL | Status: AC

## 2015-04-08 MED ORDER — POTASSIUM CHLORIDE CRYS ER 20 MEQ PO TBCR: 20.0000 meq | EXTENDED_RELEASE_TABLET | Freq: Every day | ORAL | Status: AC

## 2015-04-08 MED ORDER — HEPARIN SOD (PORK) LOCK FLUSH 100 UNIT/ML IV SOLN
500.0000 [IU] | INTRAVENOUS | Status: DC
  Filled 2015-04-08: qty 5

## 2015-04-08 NOTE — Discharge Summary (Addendum)
[name], is a 81 y.o. female  DOB 1933-11-15  MRN [id number].  Admission date:  04/03/2015  Admitting Physician  Thurnell Lose, MD  Discharge Date:  04/08/2015   Primary MD  Gwendolyn Grant, MD  Recommendations for primary care physician for things to follow:  - Patient to be followed by palliative care at [facility]. - Check labs including CBC, BMP in 3 days. - Patient to have 2 sets of blood cultures done on 7/21 , 2 days after finishing her ciprofloxacin to ensure resolution of gram-negative rods bacteremia. - Patient to continue with Foley catheter in discharge giving her perigenital and sacral wounds, Foley catheter can be discontinued if wound improves on valtrex.   Admission Diagnosis  Anorexia [R63.0] Dehydration [E86.0] Tachycardia, paroxysmal [I47.9]   Discharge Diagnosis  Anorexia [R63.0] Dehydration [E86.0] Tachycardia, paroxysmal [I47.9]    Principal Problem:   Tachycardia Active Problems:   Breast cancer left T2N0 S/P mastectomy and SLN Bx 09/2010   Benign essential HTN   Hyperlipidemia   CLL (chronic lymphocytic leukemia)   SVT (supraventricular tachycardia)      Past Medical History  Diagnosis Date  . Benign essential HTN   . Macrocytic anemia   . Vertigo     ?TIA - MRI brain 5/13  No new CVA  . Pleurisy     01/16/12 pleuritic right chest pain  V/Q scan [place] [facility] low probability PE  . Hyperlipidemia   . Thrombocytopenia, unspecified 06/15/2013  . History of blood transfusion "several"    "related to her cancer"  . GERD (gastroesophageal reflux disease)   . Stroke 05/2009    05/2009  Right upper extrem/leg weakness; nuchal pain;  . Arthritis     "knees a little" (04/03/2015)  . Anxiety   . Depression   . 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  Breast cancer left T2N0 S/P mastectomy and SLN Bx 09/2010 2009  . CLL (chronic lymphoid leukemia) in relapse     Past Surgical History  Procedure Laterality Date  . Parotidectomy Right   . Hemorrhoid surgery    . Mastectomy w/ sentinel node biopsy Left 09/2010  . Portacath placement Right     right chest  . Carotid endarterectomy Right 1990    Ar[facility] Endo 07/13/2009  . Axillary lymph node biopsy Left 2009    [facility] Endo 07/13/2009  . Breast biopsy Left 2009  . Breast excisional biopsy Left 10/2009    [facility] Endo 11/13/2009  . Bone marrow biopsy Left 07/2008    posterior iliac crest /notes 01/29/2011  . Mastectomy    . Total abdominal hysterectomy    . Dilation and curettage of uterus         History of present illness and  Hospital Course:     Kindly see H&P for history of present illness and admission details, please review complete Labs, Consult reports and Test reports for all details in  brief  HPI  from the history and physical done on the day of admission 04/03/15  [name] is a 81 y.o. female, history of CLL undergoing chemotherapy , stroke with mild right-sided weakness, generalized weakness ambulates with a walker and lives with her daughter, breast cancer status post left mastectomy, essential hypertension, anemia of chronic disease, early dementia who's been having cancer/chemotherapy related skin ulceration since April, this has been followed by dermatology outpatient and biopsied. She has 2 stage II to 3 deep ulcers 1 midline in between her buttocks and second in her vulvovaginal area. She's been going to a wound care clinic outpatient, she was recently placed on Cipro for ulcer treatment, for the last 3 days she developed a diarrhea, also she has some ulcerations in her oral mucosa. Her oral intake has been poor for the last 1-2 weeks.  She has been feeling dehydrated and weak, today while she was coming back from the wound care clinic she felt dizzy and weak, EMS was called, in the  ambulance she had runs of SVT, patient herself did not feel any palpitations or chest discomfort, in the ER SVT was confirmed on telemetry strips. She also had clinical evidence of dehydration with acute renal failure, I was called to admit the patient. She is currently feeling better besides dull chronic ache in her ulcer sites she is symptom-free  Hospital Course   SVT - Most likely related to severe dehydration, and anemia, no recurrent since admission, - Patient is on atenolol.  Pancytopenia in the setting of CLL - Oncology consult greatly appreciated, has advanced stage refractory chronic lymphocytic leukemia, biopsy at [facility] 6/27 confirmed chronic lymphocytic leukemia.PLAN FOR HOSPICE/palliative. - Transfused 2 units packed red blood cells for her anemia 04/04/15. - Worsening thrombocytopenia in the setting of sepsis, and advanced CLL, continue to monitor, no indication for transfusion at this point. Platelet is 38,000 at discharge. - Recommendation to continue with comfort/supportive care, hospice service consulted. - Platelet gradually improving.  Neutropenic fever/Pseudomonas bacteremia (sepsis present on admission as with tachycardia, tachypnea and temp 99.4) - Initially on broad-spectrum IV antibiotics vancomycin and Fortaz, stopped vancomycin 7/8, as her workup significant Pseudomonas bacteremia, treated with total of 5 days of IV Fortaz. - Agent with Pseudomonas bacteremia, and intermediate sensitivity to ciprofloxacin, plan was discussed with infectious disease Dr. Graylon Good, giving her Pseudomonas bacteremia Port-A-Cath need to be discontinued, this was discussed with daughter, giving patient advanced leukemia overall poor prognosis they do not wish to subject her for any invasive measures, so plan is to continue with ciprofloxacin to finish total of 14 days of antibiotics (Ciprofloxacin 500 mg twice a day till 7/19), and repeat blood cultures on 7/21 to ensure resolution of  bacteremia. - urine culture growing staph coag-negative   Diarrhea - C. difficile by PCR is negative, no further diarrhea ,stopped oral vancomycin, continue with probiotics.  Skin ulcers -   inserted Foley catheter. does look infected, with no discharge,will start empirically on Zovirax 1 g oral 3 times a day, as it might be related to viral origin. Finish her course of Zovirax on 7/18, and to resume oral prophylaxis dose.  Dementia - Continue with Aricept  Hypertension - Continue with atenolol  Acute on chronic renal disease - Baseline creatinine 1.2, creatinine is 1.5 on admission, back to baseline after hydration.      Discharge Condition:  Stable, overall prognosis is very poor given her advanced leukemia.   Follow UP  Follow-up Information    Schedule  an appointment as soon as possible for a visit with Gwendolyn Grant, MD.   Specialty:  Internal Medicine   Why:  post hospitalization follow up.   Contact information:   [address] [place]  [address] [phone]       Follow up with Betsy Coder, MD In 4 weeks.   Specialty:  Oncology   Contact information:   [place] [address] [phone]         Discharge Instructions  and  Discharge Medications         Discharge Instructions    Discharge instructions    Complete by:  As directed   Follow with Primary MD Gwendolyn Grant, MD in 7 days   Get CBC, CMP,  by Primary MD next visit.    Activity: As tolerated with Full fall precautions use walker/cane & assistance as needed   Disposition SNF   Diet: regular , with feeding assistance and aspiration precautions.  For Heart failure patients - Check your Weight same time everyday, if you gain over 2 pounds, or you develop in leg swelling, experience more shortness of breath or chest pain, call your Primary MD immediately. Follow Cardiac Low Salt Diet and 1.5 lit/day fluid  restriction.   On your next visit with your primary care physician please Get Medicines reviewed and adjusted.   Please request your Prim.MD to go over all Hospital Tests and Procedure/Radiological results at the follow up, please get all Hospital records sent to your Prim MD by signing hospital release before you go home.   If you experience worsening of your admission symptoms, develop shortness of breath, life threatening emergency, suicidal or homicidal thoughts you must seek medical attention immediately by calling 911 or calling your MD immediately  if symptoms less severe.  You Must read complete instructions/literature along with all the possible adverse reactions/side effects for all the Medicines you take and that have been prescribed to you. Take any new Medicines after you have completely understood and accpet all the possible adverse reactions/side effects.   Do not drive, operating heavy machinery, perform activities at heights, swimming or participation in water activities or provide baby sitting services if your were admitted for syncope or siezures until you have seen by Primary MD or a Neurologist and advised to do so again.  Do not drive when taking Pain medications.    Do not take more than prescribed Pain, Sleep and Anxiety Medications  Special Instructions: If you have smoked or chewed Tobacco  in the last 2 yrs please stop smoking, stop any regular Alcohol  and or any Recreational drug use.  Wear Seat belts while driving.   Please note  You were cared for by a hospitalist during your hospital stay. If you have any questions about your discharge medications or the care you received while you were in the hospital after you are discharged, you can call the unit and asked to speak with the hospitalist on call if the hospitalist that took care of you is not available. Once you are discharged, your primary care physician will handle any further medical issues. Please note  that NO REFILLS for any discharge medications will be authorized once you are discharged, as it is imperative that you return to your primary care physician (or establish a relationship with a primary care physician if you do not have one) for your aftercare needs so that they can reassess your need for medications  and monitor your lab values.     Increase activity slowly    Complete by:  As directed             Medication List    STOP taking these medications        hydrochlorothiazide 12.5 MG capsule  Commonly known as:  MICROZIDE     meclizine 12.5 MG tablet  Commonly known as:  ANTIVERT     oxyCODONE-acetaminophen 7.5-325 MG per tablet  Commonly known as:  PERCOCET  Replaced by:  oxyCODONE-acetaminophen 5-325 MG per tablet      TAKE these medications        acetaminophen 325 MG tablet  Commonly known as:  TYLENOL  Take 650 mg by mouth every 6 (six) hours as needed for moderate pain (pain).     acidophilus Caps capsule  Take 2 capsules by mouth daily.     acyclovir 400 MG tablet  Commonly known as:  ZOVIRAX  Take 1 tablet (400 mg total) by mouth daily.  Start taking on:  04/17/2015     atenolol 50 MG tablet  Commonly known as:  TENORMIN  Take 1 tablet (50 mg total) by mouth at bedtime.     ciprofloxacin 250 MG tablet  Commonly known as:  CIPRO  Take 2 tablets (500 mg total) by mouth every 12 (twelve) hours.     clotrimazole 1 % cream  Commonly known as:  LOTRIMIN  Apply topically 2 (two) times daily. To wound     donepezil 10 MG tablet  Commonly known as:  ARICEPT  Take 10 mg by mouth at bedtime.     feeding supplement (ENSURE ENLIVE) Liqd  Take 237 mLs by mouth 2 (two) times daily between meals.     guaiFENesin-dextromethorphan 100-10 MG/5ML syrup  Commonly known as:  ROBITUSSIN DM  Take 5 mLs by mouth every 4 (four) hours as needed for cough.     ondansetron 4 MG tablet  Commonly known as:  ZOFRAN  Take 1 tablet (4 mg total) by mouth every 6 (six)  hours as needed for nausea.     oxyCODONE-acetaminophen 5-325 MG per tablet  Commonly known as:  PERCOCET/ROXICET  Take 1 tablet by mouth every 4 (four) hours as needed for severe pain.     pantoprazole 40 MG tablet  Commonly known as:  PROTONIX  Take 1 tablet (40 mg total) by mouth daily.     polyethylene glycol packet  Commonly known as:  MIRALAX / GLYCOLAX  Take 17 g by mouth daily as needed for mild constipation (constipation).     potassium chloride SA 20 MEQ tablet  Commonly known as:  KLOR-CON M20  Take 1 tablet (20 mEq total) by mouth daily.     prochlorperazine 10 MG tablet  Commonly known as:  COMPAZINE  Take 1 tablet (10 mg total) by mouth every 6 (six) hours as needed for nausea.     tamoxifen 20 MG tablet  Commonly known as:  NOLVADEX  TAKE 1 TABLET (20 MG TOTAL) BY MOUTH DAILY.     valACYclovir 1000 MG tablet  Commonly known as:  VALTREX  Take 1 tablet (1,000 mg total) by mouth 3 (three) times daily.      ASK your doctor about these medications        amLODipine 10 MG tablet  Commonly known as:  NORVASC  Take 1 tablet (10 mg total) by mouth daily.     diphenhydrAMINE 25 MG tablet  Commonly known  as:  BENADRYL  Take 25 mg by mouth every 6 (six) hours as needed for allergies (prevent allergic reaction).          Diet and Activity recommendation: See Discharge Instructions above   Consults obtained -  Oncology    Major procedures and Radiology Reports - PLEASE review detailed and final reports for all details, in brief -     Dg Chest Port 1 View  04/03/2015   CLINICAL DATA:  Wounds on the buttock area.  Evaluate for SVT.  EXAM: PORTABLE CHEST - 1 VIEW  COMPARISON:  09/04/2014  FINDINGS: Again noted is a right jugular Port-A-Cath. A catheter tip is more midline than expected but this may be related to patient positioning. Tip is likely in the lower SVC region. Atherosclerotic calcifications at the aortic arch. Heart size is within normal limits.  There is a bandlike density overlying the left cardiac apex which probably represents overlying soft tissues. Otherwise, the lungs are clear. Degenerative changes in thoracic spine.  IMPRESSION: No acute chest findings.   Electronically Signed   By: Markus Daft M.D.   On: 04/03/2015 15:22    Micro Results     Recent Results (from the past 240 hour(s))  Culture, blood (routine x 2)     Status: None (Preliminary result)   Collection Time: 04/03/15  6:44 PM  Result Value Ref Range Status   Specimen Description BLOOD RIGHT ANTECUBITAL  Final   Special Requests BOTTLES DRAWN AEROBIC ONLY 2CC  Final   Culture NO GROWTH 4 DAYS  Final   Report Status PENDING  Incomplete  Culture, blood (routine x 2)     Status: None   Collection Time: 04/04/15 12:20 AM  Result Value Ref Range Status   Specimen Description BLOOD RIGHT HAND  Final   Special Requests IN PEDIATRIC BOTTLE 5CC  Final   Culture  Setup Time   Final    GRAM NEGATIVE RODS AEROBIC BOTTLE ONLY CRITICAL RESULT CALLED TO, READ BACK BY AND VERIFIED WITH: MELANIE SHACKLETT,RN  07.06.16 1957 M SHIPMAN CONFIRMED BY A BROWNING    Culture PSEUDOMONAS AERUGINOSA  Final   Report Status 04/07/2015 FINAL  Final   Organism ID, Bacteria PSEUDOMONAS AERUGINOSA  Final      Susceptibility   Pseudomonas aeruginosa - MIC*    CEFTAZIDIME 4 SENSITIVE Sensitive     CIPROFLOXACIN 0.5 INTERMEDIATE Intermediate     GENTAMICIN <=1 SENSITIVE Sensitive     IMIPENEM 1 SENSITIVE Sensitive     PIP/TAZO <=4 SENSITIVE Sensitive     CEFEPIME 2 SENSITIVE Sensitive     * PSEUDOMONAS AERUGINOSA  Clostridium Difficile by PCR (not at [facility])     Status: None   Collection Time: 04/04/15  6:41 AM  Result Value Ref Range Status   C difficile by pcr NEGATIVE NEGATIVE Final  Urine culture     Status: None (Preliminary result)   Collection Time: 04/05/15  5:48 AM  Result Value Ref Range Status   Specimen Description URINE, CLEAN CATCH  Final   Special Requests NONE   Final   Culture   Final    >=100,000 COLONIES/mL STAPHYLOCOCCUS SPECIES (COAGULASE NEGATIVE)   Report Status PENDING  Incomplete       Today   Subjective:   [name] today has no headache,no chest abdominal pain,no new weakness tingling or numbness, Objective:   Blood pressure 150/49, pulse 60, temperature 98.3 F (36.8 C), temperature source Oral, resp. rate 18, height 5' 3"  (1.6 m),  weight 89.812 kg (198 lb), SpO2 100 %.   Intake/Output Summary (Last 24 hours) at 04/08/15 1225 Last data filed at 04/08/15 0437  Gross per 24 hour  Intake    130 ml  Output      0 ml  Net    130 ml    Exam  Awake Alert, Oriented . [place].AT,PERRAL Supple Neck,No JVD, No cervical lymphadenopathy appriciated.  Symmetrical Chest wall movement, decreased air movement bilaterally,  RRR,No Gallops,Rubs or new Murmurs, No Parasternal Heave +ve B.Sounds, Abd Soft,mild tenderness to palpation, No organomegaly appriciated, No rebound - guarding or rigidity. No Cyanosis, pale, Clubbing or edema,has ulcers extending from right groin, buttocks and vulvovaginal area. Furuncle in the right axilla , no discharge. Data Review   CBC w Diff:  Lab Results  Component Value Date   WBC 4.6 04/08/2015   WBC 5.8 03/28/2015   HGB 9.1* 04/08/2015   HGB 9.1* 03/28/2015   HCT 27.0* 04/08/2015   HCT 27.2* 03/28/2015   PLT 38* 04/08/2015   PLT 81* 03/28/2015   LYMPHOPCT 95* 04/08/2015   LYMPHOPCT 95.2* 03/28/2015   BANDSPCT 0 04/08/2015   MONOPCT 2* 04/08/2015   MONOPCT 0.5 03/28/2015   EOSPCT 1 04/08/2015   EOSPCT 0.8 03/28/2015   EOSPCT 0 02/02/2015   BASOPCT 0 04/08/2015   BASOPCT 0.5 03/28/2015    CMP:  Lab Results  Component Value Date   NA 131* 04/07/2015   NA 136 02/23/2015   NA 142 01/01/2011   K 3.3* 04/07/2015   K 4.3 02/23/2015   CL 100* 04/07/2015   CL 107 02/25/2013   CO2 24 04/07/2015   CO2 24 02/23/2015   BUN 17 04/07/2015   BUN 16.7 02/23/2015   BUN 16 01/01/2011    CREATININE 0.93 04/07/2015   CREATININE 1.2* 02/23/2015   CREATININE 0.9 01/01/2011   GLU 104 01/01/2011   PROT 6.2* 03/21/2015   PROT 5.4* 02/23/2015   ALBUMIN 3.3* 03/21/2015   ALBUMIN 2.9* 02/23/2015   BILITOT 1.8* 03/21/2015   BILITOT 0.55 02/23/2015   ALKPHOS 77 03/21/2015   ALKPHOS 65 02/23/2015   AST 46* 03/21/2015   AST 27 02/23/2015   ALT 43 03/21/2015   ALT 29 02/23/2015  .   Total Time in preparing paper work, data evaluation and todays exam - 35 minutes  ELGERGAWY, DAWOOD M.D on 04/08/2015 at 12:25 PM  [facility]   Office  [phone]

## 2015-04-08 NOTE — Clinical Social Work Placement (Addendum)
   CLINICAL SOCIAL WORK PLACEMENT  NOTE  Date:  04/08/2015  Patient Details  Name: [name] MRN: [id number] Date of Birth: 1934/07/25  Clinical Social Work is seeking post-discharge placement for this patient at the [place] level of care (*CSW will initial, date and re-position this form in  chart as items are completed):   04/05/15   Patient/family provided with [place] Work Department's list of facilities offering this level of care within the geographic area requested by the patient (or if unable, by the patient's family).    04/05/15   Patient/family informed of their freedom to choose among providers that offer the needed level of care, that participate in Medicare, Medicaid or managed care program needed by the patient, have an available bed and are willing to accept the patient.   04/05/15   Patient/family informed of [facility]'s ownership interest in [facility] and [facility], as well as of the fact that they are under no obligation to receive care at these facilities.  PASRR submitted to EDS on  04/05/15     PASRR number received on  04/05/15     Existing PASRR number confirmed on       FL2 transmitted to all facilities in geographic area requested by pt/family on  04/05/15     FL2 transmitted to all facilities within larger geographic area on       Patient informed that his/her managed care company has contracts with or will negotiate with certain facilities, including the following:   [facility])      04/05/15   Patient/family informed of bed offers received.  Patient chooses bed at [facility] recommends and patient chooses bed at      Patient to be transferred to [facility] on 04/08/15.  Patient to be transferred to facility by Ambulance [facility])     Patient family notified on 04/08/15 of transfer.  Name of family member notified:  Edwena Bunde- Daughter      PHYSICIAN Please prepare priority discharge summary, including medications     Additional Comment: Ok per MD for d/c today to SNF.  Daughter Ennis Forts notified and is agreeable to d/c. She will go and sign admit papers at the facility.  Nursing notified to call report to facility.  Patient is alert to person but not oriented to surroundings most of the time. MD requests Palliative Care follow up at the nursing center. Discussed with Sherri.    CSW will sign off.  Edyth Gunnels in place per [place] - Admissions at [place].  No further CSW needs identified.  Lorie [place]. Murrell Redden [phone]       _______________________________________________ Williemae Area, LCSW 04/08/2015, 2:46 PM   Jones Broom. [place], MSW, [place] 04/09/2015 8:42 AM (updated 04/09/15)

## 2015-04-08 NOTE — Progress Notes (Signed)
ANTIBIOTIC CONSULT NOTE - FOLLOW UP  Pharmacy Consult for Ceftazidime Indication: Neutropenic fever / Pseudomonas bacteremia  Allergies  Allergen Reactions  . Obinutuzumab Shortness Of Breath and Rash  . Penicillins Anaphylaxis, Shortness Of Breath, Itching, Swelling and Rash    Swelling all over body and throat   . Latex Itching    Patient Measurements: Height: 5\' 3"  (160 cm) Weight: 198 lb (89.812 kg) IBW/kg (Calculated) : 52.4  Vital Signs: Temp: 98.3 F (36.8 C) (07/10 0624) Temp Source: Oral (07/10 0624) BP: 150/49 mmHg (07/10 0624) Pulse Rate: 60 (07/10 0624) Intake/Output from previous day: 07/09 0701 - 07/10 0700 In: 250 [P.O.:240; I.V.:10] Out: 1 [Stool:1] Intake/Output from this shift:    Labs:  Recent Labs  04/06/15 0405 04/07/15 0454 04/08/15 0457  WBC 4.3 3.5* 4.6  HGB 8.8* 8.4* 9.1*  PLT 31* 33* 38*  CREATININE 0.85 0.93  --    Estimated Creatinine Clearance: 52.2 mL/min (by C-G formula based on Cr of 0.93). No results for input(s): VANCOTROUGH, VANCOPEAK, VANCORANDOM, GENTTROUGH, GENTPEAK, GENTRANDOM, TOBRATROUGH, TOBRAPEAK, TOBRARND, AMIKACINPEAK, AMIKACINTROU, AMIKACIN in the last 72 hours.   Microbiology: Recent Results (from the past 720 hour(s))  TECHNOLOGIST REVIEW     Status: None   Collection Time: 03/21/15 10:38 AM  Result Value Ref Range Status   Technologist Review   Final    Few Variant lymphs present. few teardrops & ovalos. 1 nrbc  Culture, blood (routine x 2)     Status: None   Collection Time: 03/21/15  7:02 PM  Result Value Ref Range Status   Specimen Description BLOOD RIGHT ANTECUBITAL  Final   Special Requests BOTTLES DRAWN AEROBIC AND ANAEROBIC 2ML,3ML  Final   Culture   Final    NO GROWTH 5 DAYS Performed at [facility]    Report Status 03/26/2015 FINAL  Final  Culture, blood (routine x 2)     Status: None   Collection Time: 03/21/15  7:25 PM  Result Value Ref Range Status   Specimen Description BLOOD RIGHT  CHEST  Final   Special Requests BOTTLES DRAWN AEROBIC AND ANAEROBIC 5ML  Final   Culture   Final    NO GROWTH 5 DAYS Performed at [facility]    Report Status 03/26/2015 FINAL  Final  Urine culture     Status: None   Collection Time: 03/21/15 10:06 PM  Result Value Ref Range Status   Specimen Description URINE, CLEAN CATCH  Final   Special Requests NONE  Final   Culture   Final    MULTIPLE SPECIES PRESENT, SUGGEST RECOLLECTION IF CLINICALLY INDICATED Performed at [facility]    Report Status 03/23/2015 FINAL  Final  TECHNOLOGIST REVIEW     Status: None   Collection Time: 03/28/15 12:43 PM  Result Value Ref Range Status   Technologist Review Variant lymphs present  Final  Culture, blood (routine x 2)     Status: None (Preliminary result)   Collection Time: 04/03/15  6:44 PM  Result Value Ref Range Status   Specimen Description BLOOD RIGHT ANTECUBITAL  Final   Special Requests BOTTLES DRAWN AEROBIC ONLY 2CC  Final   Culture NO GROWTH 4 DAYS  Final   Report Status PENDING  Incomplete  Culture, blood (routine x 2)     Status: None   Collection Time: 04/04/15 12:20 AM  Result Value Ref Range Status   Specimen Description BLOOD RIGHT HAND  Final   Special Requests IN PEDIATRIC BOTTLE 5CC  Final  Culture  Setup Time   Final    GRAM NEGATIVE RODS AEROBIC BOTTLE ONLY CRITICAL RESULT CALLED TO, READ BACK BY AND VERIFIED WITH: MELANIE SHACKLETT,RN  07.06.16 Onycha    Culture PSEUDOMONAS AERUGINOSA  Final   Report Status 04/07/2015 FINAL  Final   Organism ID, Bacteria PSEUDOMONAS AERUGINOSA  Final      Susceptibility   Pseudomonas aeruginosa - MIC*    CEFTAZIDIME 4 SENSITIVE Sensitive     CIPROFLOXACIN 0.5 INTERMEDIATE Intermediate     GENTAMICIN <=1 SENSITIVE Sensitive     IMIPENEM 1 SENSITIVE Sensitive     PIP/TAZO <=4 SENSITIVE Sensitive     CEFEPIME 2 SENSITIVE Sensitive     * PSEUDOMONAS AERUGINOSA  Clostridium Difficile by  PCR (not at [facility])     Status: None   Collection Time: 04/04/15  6:41 AM  Result Value Ref Range Status   C difficile by pcr NEGATIVE NEGATIVE Final  Urine culture     Status: None (Preliminary result)   Collection Time: 04/05/15  5:48 AM  Result Value Ref Range Status   Specimen Description URINE, CLEAN CATCH  Final   Special Requests NONE  Final   Culture   Final    >=100,000 COLONIES/mL STAPHYLOCOCCUS SPECIES (COAGULASE NEGATIVE)   Report Status PENDING  Incomplete    Anti-infectives    Start     Dose/Rate Route Frequency Ordered Stop   04/08/15 1400  cefTAZidime (FORTAZ) 2 g in dextrose 5 % 50 mL IVPB     2 g 100 mL/hr over 30 Minutes Intravenous 3 times per day 04/08/15 1054     04/06/15 1500  valACYclovir (VALTREX) tablet 1,000 mg     1,000 mg Oral 3 times daily 04/06/15 1453     04/05/15 1700  vancomycin (VANCOCIN) 1,250 mg in sodium chloride 0.9 % 250 mL IVPB  Status:  Discontinued     1,250 mg 166.7 mL/hr over 90 Minutes Intravenous Every 24 hours 04/04/15 1537 04/05/15 1354   04/04/15 1800  vancomycin (VANCOCIN) 50 mg/mL oral solution 250 mg  Status:  Discontinued     250 mg Oral 4 times per day 04/04/15 1527 04/06/15 1002   04/04/15 1545  cefTAZidime (FORTAZ) 2 g in dextrose 5 % 50 mL IVPB  Status:  Discontinued     2 g 100 mL/hr over 30 Minutes Intravenous Every 12 hours 04/04/15 1535 04/08/15 1054   04/04/15 1545  vancomycin (VANCOCIN) 1,500 mg in sodium chloride 0.9 % 500 mL IVPB     1,500 mg 250 mL/hr over 120 Minutes Intravenous  Once 04/04/15 1535 04/04/15 2006   04/03/15 2100  acyclovir (ZOVIRAX) tablet 400 mg  Status:  Discontinued     400 mg Oral Daily 04/03/15 2005 04/06/15 1453   04/03/15 1915  vancomycin (VANCOCIN) 50 mg/mL oral solution 250 mg  Status:  Discontinued     250 mg Oral 4 times daily 04/03/15 1904 04/04/15 0829   04/03/15 1845  vancomycin (VANCOCIN) 50 mg/mL oral solution 250 mg  Status:  Discontinued     250 mg Oral 4 times per day 04/03/15  1843 04/03/15 1903      Assessment: 81 YOF presents on 7/5 with tachycardia. History of CLL undergoing chemotherapy.The patient continues on Ceftazidime for Psuedomonas bacteremia. Tmax/24h: 100.6, WBC 4.6, SCr 0.93, CrCl~50-55 ml/min. Will adjust Ceftazidime dose today for improving renal function.   Goal of Therapy:  Proper antibiotics for infection/cultures adjusted for renal/hepatic function  Plan:  1. Adjust Ceftazidime to 2g IV every 8 hours 2. Will continue to follow renal function, culture results, LOT  Alycia Rossetti, PharmD, BCPS Clinical Pharmacist Pager: [phone] 04/08/2015 11:00 AM

## 2015-04-08 NOTE — Progress Notes (Signed)
Pt refused to be turned throughout the night.  Raliegh Ip RN

## 2015-04-08 NOTE — Discharge Instructions (Signed)
Follow with Primary MD Gwendolyn Grant, MD in 7 days   Get CBC, CMP,  by Primary MD next visit.    Activity: As tolerated with Full fall precautions use walker/cane & assistance as needed   Disposition SNF   Diet: regular , with feeding assistance and aspiration precautions.  For Heart failure patients - Check your Weight same time everyday, if you gain over 2 pounds, or you develop in leg swelling, experience more shortness of breath or chest pain, call your Primary MD immediately. Follow Cardiac Low Salt Diet and 1.5 lit/day fluid restriction.   On your next visit with your primary care physician please Get Medicines reviewed and adjusted.   Please request your Prim.MD to go over all Hospital Tests and Procedure/Radiological results at the follow up, please get all Hospital records sent to your Prim MD by signing hospital release before you go home.   If you experience worsening of your admission symptoms, develop shortness of breath, life threatening emergency, suicidal or homicidal thoughts you must seek medical attention immediately by calling 911 or calling your MD immediately  if symptoms less severe.  You Must read complete instructions/literature along with all the possible adverse reactions/side effects for all the Medicines you take and that have been prescribed to you. Take any new Medicines after you have completely understood and accpet all the possible adverse reactions/side effects.   Do not drive, operating heavy machinery, perform activities at heights, swimming or participation in water activities or provide baby sitting services if your were admitted for syncope or siezures until you have seen by Primary MD or a Neurologist and advised to do so again.  Do not drive when taking Pain medications.    Do not take more than prescribed Pain, Sleep and Anxiety Medications  Special Instructions: If you have smoked or chewed Tobacco  in the last 2 yrs please stop smoking,  stop any regular Alcohol  and or any Recreational drug use.  Wear Seat belts while driving.   Please note  You were cared for by a hospitalist during your hospital stay. If you have any questions about your discharge medications or the care you received while you were in the hospital after you are discharged, you can call the unit and asked to speak with the hospitalist on call if the hospitalist that took care of you is not available. Once you are discharged, your primary care physician will handle any further medical issues. Please note that NO REFILLS for any discharge medications will be authorized once you are discharged, as it is imperative that you return to your primary care physician (or establish a relationship with a primary care physician if you do not have one) for your aftercare needs so that they can reassess your need for medications and monitor your lab values.

## 2015-04-08 NOTE — Progress Notes (Signed)
Discharged to snf report called

## 2015-04-09 ENCOUNTER — Non-Acute Institutional Stay (SKILLED_NURSING_FACILITY): Payer: Medicare Other | Admitting: Internal Medicine

## 2015-04-09 ENCOUNTER — Encounter: Payer: Self-pay | Admitting: Internal Medicine

## 2015-04-09 ENCOUNTER — Telehealth: Payer: Self-pay

## 2015-04-09 DIAGNOSIS — I10 Essential (primary) hypertension: Secondary | ICD-10-CM

## 2015-04-09 DIAGNOSIS — B965 Pseudomonas (aeruginosa) (mallei) (pseudomallei) as the cause of diseases classified elsewhere: Secondary | ICD-10-CM

## 2015-04-09 DIAGNOSIS — L98419 Non-pressure chronic ulcer of buttock with unspecified severity: Secondary | ICD-10-CM

## 2015-04-09 DIAGNOSIS — I639 Cerebral infarction, unspecified: Secondary | ICD-10-CM

## 2015-04-09 DIAGNOSIS — D61810 Antineoplastic chemotherapy induced pancytopenia: Secondary | ICD-10-CM

## 2015-04-09 DIAGNOSIS — N189 Chronic kidney disease, unspecified: Secondary | ICD-10-CM

## 2015-04-09 DIAGNOSIS — R7881 Bacteremia: Secondary | ICD-10-CM | POA: Diagnosis not present

## 2015-04-09 DIAGNOSIS — T451X5A Adverse effect of antineoplastic and immunosuppressive drugs, initial encounter: Secondary | ICD-10-CM

## 2015-04-09 DIAGNOSIS — I471 Supraventricular tachycardia: Secondary | ICD-10-CM | POA: Diagnosis not present

## 2015-04-09 DIAGNOSIS — N179 Acute kidney failure, unspecified: Secondary | ICD-10-CM | POA: Diagnosis not present

## 2015-04-09 DIAGNOSIS — F0390 Unspecified dementia without behavioral disturbance: Secondary | ICD-10-CM

## 2015-04-09 LAB — URINE CULTURE: Culture: >=100000

## 2015-04-09 NOTE — Progress Notes (Signed)
MRN: [id number] Name: [name]  Sex: female Age: 81 y.o. DOB: May 18, 1934  [place] [facility] Facility/Room:119 Level Of Care: SNF Provider: Inocencio Homes D Emergency Contacts: Extended Emergency Contact Information Primary Emergency Contact: [name] [place] Phone: [phone] Mobile Phone: [phone] Relation: Daughter Secondary Emergency Contact: [name] [place] of [place] Phone: [phone] Mobile Phone: [phone] Relation: Daughter  Code Status: DNR  Allergies: Obinutuzumab; Penicillins; and Latex  Chief Complaint  Patient presents with  . New Admit To SNF    HPI: Patient is 81 y.o. female with CLL, undergoing chemothrapy with resultant skin ulcerations who presented to hospital with SVT and dehydration and treated from 7/5-7/10.Pt has advanced stage refractory CLL. Pt is admitted to SNF where palliative has been consulted. However, pt will continue treatment for pseudomonas bacteremia with Cipro until 7/19 at SNF and [facility] will be drawn 7/21 to confirm resolution. Pt's thombocytopenia in face of CLL will be followed with f/u CBC in 3 days as will her anemia due to cancer chemotherapy and CLL for which pt received transfusion in hospital and will receive tx again if Hb < 7. Pt will continue zovirax until 7/19 for ulcerative dx of GU and mouth and will continue foley. Will follow pt's BP with atenolol and titrate prn po intake; will continue dementia tx with aricept and follow acute on chronic kidney disease with BMP in 3 days.  Past Medical History  Diagnosis Date  . Benign essential HTN   . Macrocytic anemia   . Vertigo     ?TIA - MRI brain 5/13  No new CVA  . Pleurisy     01/16/12 pleuritic right chest pain  V/Q scan [place] [facility] low probability PE  . Hyperlipidemia   . Thrombocytopenia, unspecified 06/15/2013  . History of blood transfusion "several"    "related to her cancer"  . GERD (gastroesophageal reflux  disease)   . Stroke 05/2009    05/2009  Right upper extrem/leg weakness; nuchal pain;  . Arthritis     "knees a little" (04/03/2015)  . Anxiety   . Depression   . 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 Breast cancer left T2N0 S/P mastectomy and SLN Bx 09/2010 2009  . CLL (chronic lymphoid leukemia) in relapse     Past Surgical History  Procedure Laterality Date  . Parotidectomy Right   . Hemorrhoid surgery    . Mastectomy w/ sentinel node biopsy Left 09/2010  . Portacath placement Right     right chest  . Carotid endarterectomy Right 1990    Archie Endo 07/13/2009  . Axillary lymph node biopsy Left 2009    Archie Endo 07/13/2009  . Breast biopsy Left 2009  . Breast excisional biopsy Left 10/2009    Archie Endo 11/13/2009  . Bone marrow biopsy Left 07/2008    posterior iliac crest /notes 01/29/2011  . Mastectomy    . Total abdominal hysterectomy    . Dilation and curettage of uterus        Medication List       This list is accurate as of: 04/09/15 11:59 PM.  Always use your most recent med list.               acetaminophen 325 MG tablet  Commonly known as:  TYLENOL  Take 650 mg by mouth every 6 (six) hours as needed for moderate pain (pain).  acidophilus Caps capsule  Take 2 capsules by mouth daily.     acyclovir 400 MG tablet  Commonly known as:  ZOVIRAX  Take 1 tablet (400 mg total) by mouth daily.  Start taking on:  04/17/2015     amLODipine 10 MG tablet  Commonly known as:  NORVASC  Take 1 tablet (10 mg total) by mouth daily.     atenolol 50 MG tablet  Commonly known as:  TENORMIN  Take 1 tablet (50 mg total) by mouth at bedtime.     ciprofloxacin 250 MG tablet  Commonly known as:  CIPRO  Take 2 tablets (500 mg total) by mouth every 12 (twelve) hours.     clotrimazole 1 % cream  Commonly known as:  LOTRIMIN  Apply topically 2 (two) times daily. To  wound     diphenhydrAMINE 25 MG tablet  Commonly known as:  BENADRYL  Take 25 mg by mouth every 6 (six) hours as needed for allergies (prevent allergic reaction).     donepezil 10 MG tablet  Commonly known as:  ARICEPT  Take 10 mg by mouth at bedtime.     feeding supplement (ENSURE ENLIVE) Liqd  Take 237 mLs by mouth 2 (two) times daily between meals.     guaiFENesin-dextromethorphan 100-10 MG/5ML syrup  Commonly known as:  ROBITUSSIN DM  Take 5 mLs by mouth every 4 (four) hours as needed for cough.     ondansetron 4 MG tablet  Commonly known as:  ZOFRAN  Take 1 tablet (4 mg total) by mouth every 6 (six) hours as needed for nausea.     oxyCODONE-acetaminophen 5-325 MG per tablet  Commonly known as:  PERCOCET/ROXICET  Take 1 tablet by mouth every 4 (four) hours as needed for severe pain.     pantoprazole 40 MG tablet  Commonly known as:  PROTONIX  Take 1 tablet (40 mg total) by mouth daily.     polyethylene glycol packet  Commonly known as:  MIRALAX / GLYCOLAX  Take 17 g by mouth daily as needed for mild constipation (constipation).     potassium chloride SA 20 MEQ tablet  Commonly known as:  KLOR-CON M20  Take 1 tablet (20 mEq total) by mouth daily.     prochlorperazine 10 MG tablet  Commonly known as:  COMPAZINE  Take 1 tablet (10 mg total) by mouth every 6 (six) hours as needed for nausea.     tamoxifen 20 MG tablet  Commonly known as:  NOLVADEX  TAKE 1 TABLET (20 MG TOTAL) BY MOUTH DAILY.     valACYclovir 1000 MG tablet  Commonly known as:  VALTREX  Take 1 tablet (1,000 mg total) by mouth 3 (three) times daily.        No orders of the defined types were placed in this encounter.    Immunization History  Administered Date(s) Administered  . Influenza Split 06/15/2012  . Influenza, High Dose Seasonal PF 07/13/2013  . Influenza,inj,Quad PF,36+ Mos 07/24/2014  . Pneumococcal Conjugate-13 08/14/2014  . Pneumococcal Polysaccharide-23 02/01/2013  . Td  02/01/2013    History  Substance Use Topics  . Smoking status: Former Smoker    Types: Cigarettes  . Smokeless tobacco: Never Used     Comment: 04/03/2015 "quit smoking cigarettes back in the 1970's"  . Alcohol Use: No    Family history is + breast CA    Review of Systems  DATA OBTAINED: from patient, family member- daughter GENERAL:  no fevers, +fatigue,+ appetite changes SKIN:  daughter admits wounds causing most pain EYES: No eye pain, redness, discharge EARS: No earache, tinnitus, change in hearing NOSE: No congestion, drainage or bleeding  MOUTH/THROAT: No mouth or tooth pain, No sore throat RESPIRATORY: No cough, wheezing, SOB CARDIAC: No chest pain, palpitations, lower extremity edema  GI: No abdominal pain, No N/V/D or constipation, No heartburn or reflux  GU: No dysuria, frequency or urgency, or incontinence  MUSCULOSKELETAL: No unrelieved bone/joint pain NEUROLOGIC: No headache, dizziness or focal weakness PSYCH - no c/o anxiety or sadness.   Filed Vitals:   04/09/15 1452  BP: 113/77  Pulse: 74  Temp: 97.8 F (36.6 C)  Resp: 18    Physical Exam  GENERAL APPEARANCE: Alert,min  conversant,  Great distress as I am seeing her during dressing changes  SKIN: full skin thickness skin is gone and open B perineum from anus to labia, does not appear infected HEAD: Normocephalic, atraumatic  EYES: Conjunctiva/lids clear. Pupils round, reactive. EOMs intact.  EARS: External exam WNL, canals clear. Hearing grossly normal.  NOSE: No deformity or discharge.  MOUTH/THROAT: Lips w/o lesions  RESPIRATORY: Breathing is even, unlabored. Lung sounds are clear   CARDIOVASCULAR: Heart RRR no murmurs, rubs or gallops. No peripheral edema.   GASTROINTESTINAL: Abdomen is soft, non-tender, not distended w/ normal bowel sounds. GENITOURINARY: Bladder non tender, not distended  MUSCULOSKELETAL: No abnormal joints or musculature NEUROLOGIC:  Cranial nerves 2-12 grossly  intact. PSYCHIATRIC:  no behavioral issues  Patient Active Problem List   Diagnosis Date Noted  . Antineoplastic chemotherapy induced pancytopenia 04/15/2015  . Bacteremia due to Pseudomonas 04/15/2015  . Acute on chronic kidney failure 04/15/2015  . Dementia without behavioral disturbance 04/15/2015  . Tachycardia 04/03/2015  . SVT (supraventricular tachycardia) 04/03/2015  . Neutropenia   . Cellulitis 02/14/2015  . Skin ulcer of multiple sites of buttock 02/14/2015  . Hypertension 07/05/2014  . Numbness and tingling 02/27/2014  . CLL (chronic lymphocytic leukemia) 06/15/2013  . Hyperlipidemia   . Memory loss   . Vertigo 02/13/2012  . Pleurisy 02/13/2012  . CLL (chronic lymphoid leukemia) in relapse 02/13/2012  . Low grade malignant lymphoma 02/03/2012  . Stroke 02/03/2012  . Benign essential HTN 02/03/2012  . Macrocytic anemia 02/03/2012  . Breast cancer left T2N0 S/P mastectomy and SLN Bx 09/2010 09/18/2011    CBC    Component Value Date/Time   WBC 4.6 04/08/2015 0457   WBC 5.8 03/28/2015 1243   RBC 2.81* 04/08/2015 0457   RBC 2.71* 03/28/2015 1243   HGB 9.1* 04/08/2015 0457   HGB 9.1* 03/28/2015 1243   HCT 27.0* 04/08/2015 0457   HCT 27.2* 03/28/2015 1243   PLT 38* 04/08/2015 0457   PLT 81* 03/28/2015 1243   MCV 96.1 04/08/2015 0457   MCV 100.4 03/28/2015 1243   LYMPHSABS 4.4* 04/08/2015 0457   LYMPHSABS 5.5* 03/28/2015 1243   MONOABS 0.1 04/08/2015 0457   MONOABS 0.0* 03/28/2015 1243   EOSABS 0.0 04/08/2015 0457   EOSABS 0.0 03/28/2015 1243   BASOSABS 0.0 04/08/2015 0457   BASOSABS 0.0 03/28/2015 1243    CMP     Component Value Date/Time   NA 131* 04/07/2015 0454   NA 136 02/23/2015 0913   NA 142 01/01/2011   K 3.3* 04/07/2015 0454   K 4.3 02/23/2015 0913   CL 100* 04/07/2015 0454   CL 107 02/25/2013 1028   CO2 24 04/07/2015 0454   CO2 24 02/23/2015 0913   GLUCOSE 114* 04/07/2015 0454   GLUCOSE 130 02/23/2015  0913   GLUCOSE 147* 02/25/2013 1028    BUN 17 04/07/2015 0454   BUN 16.7 02/23/2015 0913   BUN 16 01/01/2011   CREATININE 0.93 04/07/2015 0454   CREATININE 1.2* 02/23/2015 0913   CREATININE 0.9 01/01/2011   CALCIUM 7.9* 04/07/2015 0454   CALCIUM 8.5 02/23/2015 0913   PROT 6.2* 03/21/2015 1858   PROT 5.4* 02/23/2015 0913   ALBUMIN 3.3* 03/21/2015 1858   ALBUMIN 2.9* 02/23/2015 0913   AST 46* 03/21/2015 1858   AST 27 02/23/2015 0913   ALT 43 03/21/2015 1858   ALT 29 02/23/2015 0913   ALKPHOS 77 03/21/2015 1858   ALKPHOS 65 02/23/2015 0913   BILITOT 1.8* 03/21/2015 1858   BILITOT 0.55 02/23/2015 0913   GFRNONAA 57* 04/07/2015 0454   GFRAA >60 04/07/2015 0454   Dg Chest Port 1 View  04/03/2015   CLINICAL DATA:  Wounds on the buttock area.  Evaluate for SVT.  EXAM: PORTABLE CHEST - 1 VIEW  COMPARISON:  09/04/2014  FINDINGS: Again noted is a right jugular Port-A-Cath. A catheter tip is more midline than expected but this may be related to patient positioning. Tip is likely in the lower SVC region. Atherosclerotic calcifications at the aortic arch. Heart size is within normal limits. There is a bandlike density overlying the left cardiac apex which probably represents overlying soft tissues. Otherwise, the lungs are clear. Degenerative changes in thoracic spine.  IMPRESSION: No acute chest findings.   Electronically Signed   By: Markus Daft M.D.   On: 04/03/2015 15:22   Not all labs, radiology exams or other studies done during hospitalization come through on my EPIC note; however they are reviewed by me.   Assessment and Plan  Antineoplastic chemotherapy induced pancytopenia Has advanced stage refractory chronic lymphocytic leukemia, biopsy at [facility] 6/27 confirmed chronic lymphocytic leukemia.PLAN FOR HOSPICE/palliative. - Transfused 2 units packed red blood cells for her anemia 04/04/15. - Worsening thrombocytopenia in the setting of sepsis, and advanced CLL,  no indication for transfusion at this point. Platelet is 38,000  at discharge.SNF plan - order serial CBC and tx Hb< 7  SVT (supraventricular tachycardia) Most likely related to severe dehydration, and anemia, no recurrent since admission, - Patient is on atenolol.   Bacteremia due to Pseudomonas Initially on broad-spectrum IV antibiotics vancomycin and Fortaz, stopped vancomycin 7/8, as her workup significant Pseudomonas bacteremia, treated with total of 5 days of IV Fortaz. - Agent with Pseudomonas bacteremia, and intermediate sensitivity to ciprofloxacin, plan was discussed with infectious disease Dr. Graylon Good, giving her Pseudomonas bacteremia Port-A-Cath need to be discontinued, this was discussed with daughter, giving patient advanced leukemia overall poor prognosis they do not wish to subject her for any invasive measures - urine culture growing staph coag-negative  SNF  Plan-  continue with ciprofloxacin to finish total of 14 days of antibiotics (Ciprofloxacin 500 mg twice a day till 7/19), and repeat blood cultures on 7/21 to ensure resolution of bacteremia.  Skin ulcer of multiple sites of buttock - inserted Foley catheter. does look infected, with no discharge,will start empirically on Zovirax 1 g oral 3 times a day, as it might be related to viral origin. SNF plan -Elizebeth Koller course of Zovirax on 7/18, and to resume oral prophylaxis dose; wound care nursing  Acute on chronic kidney failure - Baseline creatinine 1.2, creatinine is 1.5 on admission, back to baseline afterIV  Hydration.SNF plan - pt will be on orals, dehydration probable;BMP weekly   Hypertension Stable,Plan- continue norvasc and atenolol  Dementia without behavioral disturbance Stable;SNf plan- cont ariept  Stroke Stable with R side weakness;SNF plan - no prolphylaxis, assist with ADL    Hennie Duos, MD

## 2015-04-09 NOTE — Telephone Encounter (Signed)
Multi-drug-resistant organism UTI reviewed - unclear if specific treatment needed for ? Colonization versus illness  Apparently, patient arrived to [facility] [place] (SNF) on 7/10 -therefore under the care of skilled nursing facility attending I would recommend contacting skilled nursing facility and requesting name of responsible attending Also request fax number and then fax urine culture results Attn to [facility] responsible attending physician - I will defer any antibiotics and management to current attending physician @ SNF Thanks for managing this handoff!

## 2015-04-09 NOTE — Telephone Encounter (Signed)
Per pcp:   Faxed copy of culture results to SNF attending md.  Spoke to [place] at [place]. Current attending for pt is Dr. Walker Kehr.  [phone] f) [phone] p).

## 2015-04-09 NOTE — Telephone Encounter (Signed)
error 

## 2015-04-09 NOTE — Telephone Encounter (Signed)
Rodman Key with [facility] Microbiology lab called to update UA culture done on pt during admission (dc on 04/08/15).  Final UA culture result with read:  100,000+ colony of ACINEOBACTER BAUMANNII  Please advise on treatment for UTI. Will be calling pt also to schedule follow visit with PCP or fist available provider in the office.

## 2015-04-10 ENCOUNTER — Telehealth: Payer: Self-pay | Admitting: Oncology

## 2015-04-10 ENCOUNTER — Other Ambulatory Visit: Payer: Self-pay | Admitting: *Deleted

## 2015-04-10 DIAGNOSIS — C9192 Lymphoid leukemia, unspecified, in relapse: Secondary | ICD-10-CM

## 2015-04-10 DIAGNOSIS — C9112 Chronic lymphocytic leukemia of B-cell type in relapse: Secondary | ICD-10-CM

## 2015-04-10 LAB — CBC
HCT: 20.4 % — ABNORMAL LOW (ref 36.0–46.0)
Hemoglobin: 6.7 g/dL — CL (ref 12.0–15.0)
MCH: 32.7 pg (ref 26.0–34.0)
MCHC: 32.8 g/dL (ref 30.0–36.0)
MCV: 99.5 fL (ref 78.0–100.0)
Platelets: 59 10*3/uL — ABNORMAL LOW (ref 150–400)
RBC: 2.05 MIL/uL — AB (ref 3.87–5.11)
RDW: 23.2 % — ABNORMAL HIGH (ref 11.5–15.5)
WBC: 6.1 10*3/uL (ref 4.0–10.5)

## 2015-04-10 NOTE — Telephone Encounter (Signed)
Lft msg for pt's daughter Ennis Forts confirming labs/ov per 07/12 POF, mailed out schedule... KJ

## 2015-04-15 DIAGNOSIS — R7881 Bacteremia: Secondary | ICD-10-CM | POA: Insufficient documentation

## 2015-04-15 DIAGNOSIS — T451X5A Adverse effect of antineoplastic and immunosuppressive drugs, initial encounter: Principal | ICD-10-CM

## 2015-04-15 DIAGNOSIS — N189 Chronic kidney disease, unspecified: Secondary | ICD-10-CM

## 2015-04-15 DIAGNOSIS — D61810 Antineoplastic chemotherapy induced pancytopenia: Secondary | ICD-10-CM | POA: Insufficient documentation

## 2015-04-15 DIAGNOSIS — F0390 Unspecified dementia without behavioral disturbance: Secondary | ICD-10-CM | POA: Insufficient documentation

## 2015-04-15 DIAGNOSIS — N179 Acute kidney failure, unspecified: Secondary | ICD-10-CM | POA: Insufficient documentation

## 2015-04-15 NOTE — Assessment & Plan Note (Signed)
Stable;SNf plan- cont ariept

## 2015-04-15 NOTE — Assessment & Plan Note (Signed)
-   Baseline creatinine 1.2, creatinine is 1.5 on admission, back to baseline afterIV  Hydration.SNF plan - pt will be on orals, dehydration probable;BMP weekly

## 2015-04-15 NOTE — Assessment & Plan Note (Signed)
Stable with R side weakness;SNF plan - no prolphylaxis, assist with ADL

## 2015-04-15 NOTE — Assessment & Plan Note (Signed)
Stable,Plan- continue norvasc and atenolol

## 2015-04-15 NOTE — Assessment & Plan Note (Signed)
-   inserted Foley catheter. does look infected, with no discharge,will start empirically on Zovirax 1 g oral 3 times a day, as it might be related to viral origin. SNF plan -[name] course of Zovirax on 7/18, and to resume oral prophylaxis dose; wound care nursing

## 2015-04-15 NOTE — Assessment & Plan Note (Signed)
Most likely related to severe dehydration, and anemia, no recurrent since admission, - Patient is on atenolol.

## 2015-04-15 NOTE — Assessment & Plan Note (Signed)
Has advanced stage refractory chronic lymphocytic leukemia, biopsy at [facility] 6/27 confirmed chronic lymphocytic leukemia.PLAN FOR HOSPICE/palliative. - Transfused 2 units packed red blood cells for her anemia 04/04/15. - Worsening thrombocytopenia in the setting of sepsis, and advanced CLL,  no indication for transfusion at this point. Platelet is 38,000 at discharge.SNF plan - order serial CBC and tx Hb< 7

## 2015-04-15 NOTE — Assessment & Plan Note (Signed)
Initially on broad-spectrum IV antibiotics vancomycin and Tressie Ellis, stopped vancomycin 7/8, as her workup significant Pseudomonas bacteremia, treated with total of 5 days of IV Fortaz. - Agent with Pseudomonas bacteremia, and intermediate sensitivity to ciprofloxacin, plan was discussed with infectious disease Dr. Graylon Good, giving her Pseudomonas bacteremia Port-A-Cath need to be discontinued, this was discussed with daughter, giving patient advanced leukemia overall poor prognosis they do not wish to subject her for any invasive measures - urine culture growing staph coag-negative  SNF  Plan-  continue with ciprofloxacin to finish total of 14 days of antibiotics (Ciprofloxacin 500 mg twice a day till 7/19), and repeat blood cultures on 7/21 to ensure resolution of bacteremia.

## 2015-04-16 ENCOUNTER — Telehealth: Payer: Self-pay | Admitting: Oncology

## 2015-04-16 NOTE — Telephone Encounter (Signed)
pt dtr called to cx appt due to pt in skilled nursing facility...done...they will call back to r/s

## 2015-04-17 ENCOUNTER — Other Ambulatory Visit: Payer: Medicare Other

## 2015-04-17 ENCOUNTER — Ambulatory Visit: Payer: Medicare Other | Admitting: Oncology

## 2015-04-19 ENCOUNTER — Encounter: Payer: Self-pay | Admitting: Internal Medicine

## 2015-04-19 ENCOUNTER — Non-Acute Institutional Stay (SKILLED_NURSING_FACILITY): Payer: Medicare Other | Admitting: Internal Medicine

## 2015-04-19 DIAGNOSIS — R627 Adult failure to thrive: Secondary | ICD-10-CM | POA: Diagnosis not present

## 2015-04-19 DIAGNOSIS — C9110 Chronic lymphocytic leukemia of B-cell type not having achieved remission: Secondary | ICD-10-CM

## 2015-04-19 NOTE — Progress Notes (Signed)
MRN: [id number] Name: [name]  Sex: female Age: 81 y.o. DOB: [birth year]  [place] #: Helene Kelp Facility/Room:119 Level Of Care: SNF Provider: Inocencio Homes D Emergency Contacts: Extended Emergency Contact Information Primary Emergency Contact: [name] [place] Phone: [phone] Mobile Phone: [phone] Relation: Daughter Secondary Emergency Contact: [name] [place] Phone: [phone] Mobile Phone: [phone] Relation: Daughter  Code Status: DNR  Allergies: Obinutuzumab; Penicillins; and Latex  Chief Complaint  Patient presents with  . Acute Visit    HPI: Patient is 81 y.o. female who has endstage CLL whose family feared she had had a stroke this am. Nursing diverted them from their desire to take her to the ED - pt is palliative- with IVF and I am planning a long session with them myself now.  Past Medical History  Diagnosis Date  . Benign essential HTN   . Macrocytic anemia   . Vertigo     ?TIA - MRI brain 5/13  No new CVA  . Pleurisy     01/16/12 pleuritic right chest pain  V/Q scan [place] [facility] low probability PE  . Hyperlipidemia   . Thrombocytopenia, unspecified 06/15/2013  . History of blood transfusion "several"    "related to her cancer"  . GERD (gastroesophageal reflux disease)   . Stroke 05/2009    05/2009  Right upper extrem/leg weakness; nuchal pain;  . Arthritis     "knees a little" (04/03/2015)  . Anxiety   . Depression   . 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 Breast cancer left T2N0 S/P mastectomy and SLN Bx 09/2010 2009  . CLL (chronic lymphoid leukemia) in relapse     Past Surgical History  Procedure Laterality Date  . Parotidectomy Right   . Hemorrhoid surgery    . Mastectomy w/ sentinel node biopsy Left 09/2010  . Portacath placement Right     right chest   . Carotid endarterectomy Right 1990    Archie Endo 07/13/2009  . Axillary lymph node biopsy Left 2009    Archie Endo 07/13/2009  . Breast biopsy Left 2009  . Breast excisional biopsy Left 10/2009    Archie Endo 11/13/2009  . Bone marrow biopsy Left 07/2008    posterior iliac crest /notes 01/29/2011  . Mastectomy    . Total abdominal hysterectomy    . Dilation and curettage of uterus        Medication List       This list is accurate as of: 04/19/15 11:59 PM.  Always use your most recent med list.               acetaminophen 325 MG tablet  Commonly known as:  TYLENOL  Take 650 mg by mouth every 6 (six) hours as needed for moderate pain (pain).     acidophilus Caps capsule  Take 2 capsules by mouth daily.     acyclovir 400 MG tablet  Commonly known as:  ZOVIRAX  Take 1 tablet (400 mg total) by mouth daily.     amLODipine 10 MG tablet  Commonly known as:  NORVASC  Take 1 tablet (10 mg total) by mouth daily.     atenolol 50 MG tablet  Commonly known as:  TENORMIN  Take 1 tablet (50 mg total) by mouth at bedtime.     clotrimazole 1 % cream  Commonly known as:  LOTRIMIN  Apply topically  2 (two) times daily. To wound     diphenhydrAMINE 25 MG tablet  Commonly known as:  BENADRYL  Take 25 mg by mouth every 6 (six) hours as needed for allergies (prevent allergic reaction).     donepezil 10 MG tablet  Commonly known as:  ARICEPT  Take 10 mg by mouth at bedtime.     feeding supplement (ENSURE ENLIVE) Liqd  Take 237 mLs by mouth 2 (two) times daily between meals.     guaiFENesin-dextromethorphan 100-10 MG/5ML syrup  Commonly known as:  ROBITUSSIN DM  Take 5 mLs by mouth every 4 (four) hours as needed for cough.     ondansetron 4 MG tablet  Commonly known as:  ZOFRAN  Take 1 tablet (4 mg total) by mouth every 6 (six) hours as needed for nausea.     oxyCODONE-acetaminophen 5-325 MG per tablet  Commonly known as:  PERCOCET/ROXICET  Take 1 tablet by mouth every 4 (four) hours as  needed for severe pain.     pantoprazole 40 MG tablet  Commonly known as:  PROTONIX  Take 1 tablet (40 mg total) by mouth daily.     polyethylene glycol packet  Commonly known as:  MIRALAX / GLYCOLAX  Take 17 g by mouth daily as needed for mild constipation (constipation).     potassium chloride SA 20 MEQ tablet  Commonly known as:  KLOR-CON M20  Take 1 tablet (20 mEq total) by mouth daily.     prochlorperazine 10 MG tablet  Commonly known as:  COMPAZINE  Take 1 tablet (10 mg total) by mouth every 6 (six) hours as needed for nausea.     tamoxifen 20 MG tablet  Commonly known as:  NOLVADEX  TAKE 1 TABLET (20 MG TOTAL) BY MOUTH DAILY.        No orders of the defined types were placed in this encounter.    Immunization History  Administered Date(s) Administered  . Influenza Split 06/15/2012  . Influenza, High Dose Seasonal PF 07/13/2013  . Influenza,inj,Quad PF,36+ Mos 07/24/2014  . Pneumococcal Conjugate-13 08/14/2014  . Pneumococcal Polysaccharide-23 02/01/2013  . Td 02/01/2013    History  Substance Use Topics  . Smoking status: Former Smoker    Types: Cigarettes  . Smokeless tobacco: Never Used     Comment: 04/03/2015 "quit smoking cigarettes back in the 1970's"  . Alcohol Use: No    Review of Systems  DATA OBTAINED: from nurse, family member GENERAL:  no fevers SKIN: No itching, rash HEENT: No complaint RESPIRATORY: inc resp effort CARDIAC: No chest pain, palpitations, lower extremity edema  GI: No abdominal pain, No N/V/D or constipation, No heartburn or reflux  GU: No dysuria, frequency or urgency  MUSCULOSKELETAL: No unrelieved bone/joint pain NEUROLOGIC: family says the facial  Weakness on one side has resolved PSYCHIATRIC: per daughter pt is ready to go  Filed Vitals:   04/19/15 2150  BP: 123/47  Pulse: 73  Temp: 100.9 F (38.3 C)  Resp: 20    Physical Exam  GENERAL APPEARANCE: very somnalent but opened her eyes to my calling her name and  smiled at me, she looks in no mental distress SKIN: No diaphoresis rash HEENT: Unremarkable RESPIRATORY: inc RR and shallow  CARDIOVASCULAR: Heart RRR no murmurs, rubs or gallops. No peripheral edema  GASTROINTESTINAL: Abdomen is soft, non-tender, not distended w/ normal bowel sounds.  GENITOURINARY: Bladder non tender, not distended  MUSCULOSKELETAL: No abnormal joints or musculature NEUROLOGIC: Cranial nerves 2-12 grossly intact, do not note  facial  assymetry and did not test for more PSYCHIATRIC: pt calm, somnalent  Patient Active Problem List   Diagnosis Date Noted  . FTT (failure to thrive) in adult 04/21/2015  . Antineoplastic chemotherapy induced pancytopenia 04/15/2015  . Bacteremia due to Pseudomonas 04/15/2015  . Acute on chronic kidney failure 04/15/2015  . Dementia without behavioral disturbance 04/15/2015  . Tachycardia 04/03/2015  . SVT (supraventricular tachycardia) 04/03/2015  . Neutropenia   . Cellulitis 02/14/2015  . Skin ulcer of multiple sites of buttock 02/14/2015  . Hypertension 07/05/2014  . Numbness and tingling 02/27/2014  . CLL (chronic lymphocytic leukemia) 06/15/2013  . Hyperlipidemia   . Memory loss   . Vertigo 02/13/2012  . Pleurisy 02/13/2012  . CLL (chronic lymphoid leukemia) in relapse 02/13/2012  . Low grade malignant lymphoma 02/03/2012  . Stroke 02/03/2012  . Benign essential HTN 02/03/2012  . Macrocytic anemia 02/03/2012  . Breast cancer left T2N0 S/P mastectomy and SLN Bx 09/2010 09/18/2011    CBC    Component Value Date/Time   WBC 4.6 04/08/2015 0457   WBC 5.8 03/28/2015 1243   RBC 2.81* 04/08/2015 0457   RBC 2.71* 03/28/2015 1243   HGB 9.1* 04/08/2015 0457   HGB 9.1* 03/28/2015 1243   HCT 27.0* 04/08/2015 0457   HCT 27.2* 03/28/2015 1243   PLT 38* 04/08/2015 0457   PLT 81* 03/28/2015 1243   MCV 96.1 04/08/2015 0457   MCV 100.4 03/28/2015 1243   LYMPHSABS 4.4* 04/08/2015 0457   LYMPHSABS 5.5* 03/28/2015 1243   MONOABS 0.1  04/08/2015 0457   MONOABS 0.0* 03/28/2015 1243   EOSABS 0.0 04/08/2015 0457   EOSABS 0.0 03/28/2015 1243   BASOSABS 0.0 04/08/2015 0457   BASOSABS 0.0 03/28/2015 1243    CMP     Component Value Date/Time   NA 131* 04/07/2015 0454   NA 136 02/23/2015 0913   NA 142 01/01/2011   K 3.3* 04/07/2015 0454   K 4.3 02/23/2015 0913   CL 100* 04/07/2015 0454   CL 107 02/25/2013 1028   CO2 24 04/07/2015 0454   CO2 24 02/23/2015 0913   GLUCOSE 114* 04/07/2015 0454   GLUCOSE 130 02/23/2015 0913   GLUCOSE 147* 02/25/2013 1028   BUN 17 04/07/2015 0454   BUN 16.7 02/23/2015 0913   BUN 16 01/01/2011   CREATININE 0.93 04/07/2015 0454   CREATININE 1.2* 02/23/2015 0913   CREATININE 0.9 01/01/2011   CALCIUM 7.9* 04/07/2015 0454   CALCIUM 8.5 02/23/2015 0913   PROT 6.2* 03/21/2015 1858   PROT 5.4* 02/23/2015 0913   ALBUMIN 3.3* 03/21/2015 1858   ALBUMIN 2.9* 02/23/2015 0913   AST 46* 03/21/2015 1858   AST 27 02/23/2015 0913   ALT 43 03/21/2015 1858   ALT 29 02/23/2015 0913   ALKPHOS 77 03/21/2015 1858   ALKPHOS 65 02/23/2015 0913   BILITOT 1.8* 03/21/2015 1858   BILITOT 0.55 02/23/2015 0913   GFRNONAA 57* 04/07/2015 0454   GFRAA >60 04/07/2015 0454    Assessment and Plan  FTT (failure to thrive) in adult Spoke at length with pt's daughter and son-in-law. I started with even if pt had had a stroke what sort of treatment  would she be a candidate to get or that would alter the course of her CLL? This answered any lingering doubts and we discussed her last few days, dec appetite ,etc. and everything else.I told them what to expect to see as she died. They told me she had stated her readiness to die.  The family seemed very accepting and calm. I recommended Hospice now, having seen the Hospice nurse before I went into pt's room, and I wrote the order,  but I don't know if she will last that long. I had already written liquid morphine for her. Over the course of the rest of the day I saw many  family members in her room.  CLL (chronic lymphocytic leukemia) Pt had opted for palliative/Hospice care.    > 35 min spent with nursing, Hospice nurse and family, and new labs, no longer relevant > 50% of time with patient was spent reviewing records, labs, tests and studies, counseling and developing plan of care  Hennie Duos, MD

## 2015-04-21 ENCOUNTER — Encounter: Payer: Self-pay | Admitting: Internal Medicine

## 2015-04-21 DIAGNOSIS — R627 Adult failure to thrive: Secondary | ICD-10-CM | POA: Insufficient documentation

## 2015-04-21 NOTE — Assessment & Plan Note (Signed)
Spoke at length with pt's daughter and son-in-law. I started with even if pt had had a stroke what sort of treatment  would she be a candidate to get or that would alter the course of her CLL? This answered any lingering doubts and we discussed her last few days, dec appetite ,etc. and everything else.I told them what to expect to see as she died. They told me she had stated her readiness to die. The family seemed very accepting and calm. I recommended Hospice now, having seen the Hospice nurse before I went into pt's room, and I wrote the order,  but I don't know if she will last that long. I had already written liquid morphine for her. Over the course of the rest of the day I saw many family members in her room.

## 2015-04-21 NOTE — Assessment & Plan Note (Signed)
Pt had opted for palliative/Hospice care.

## 2015-04-30 DEATH — deceased

## 2015-07-09 ENCOUNTER — Encounter: Payer: Self-pay | Admitting: Internal Medicine

## 2015-07-26 ENCOUNTER — Ambulatory Visit: Payer: Medicare Other | Admitting: Internal Medicine

## 2015-11-27 IMAGING — CT CT ABD-PELV W/O CM
2 of 4 series · 16 of 46 positions shown, 18 images · non-contrast
Comparison: CT 02/10/2012

CLINICAL DATA: Chronic lymphocytic leukemia. Subsequent treatment
strategy. Chemotherapy ongoing.

EXAM:
CT ABDOMEN AND PELVIS WITHOUT CONTRAST
TECHNIQUE: Multidetector CT imaging of the abdomen and pelvis was performed
following the standard protocol without IV contrast.

[Series 2: rtn a/p w/o · axial · non-contrast · 0.81mm/px · z∈[-460,-66]mm · 13 of 87 slices shown, 15 images]
[im 4/87  soft-tissue]
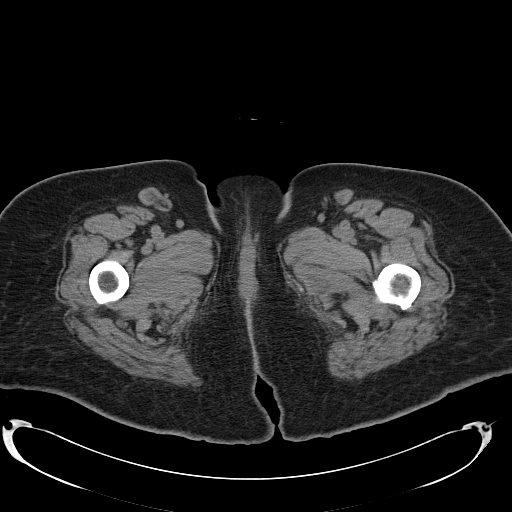
[im 4/87  bone]
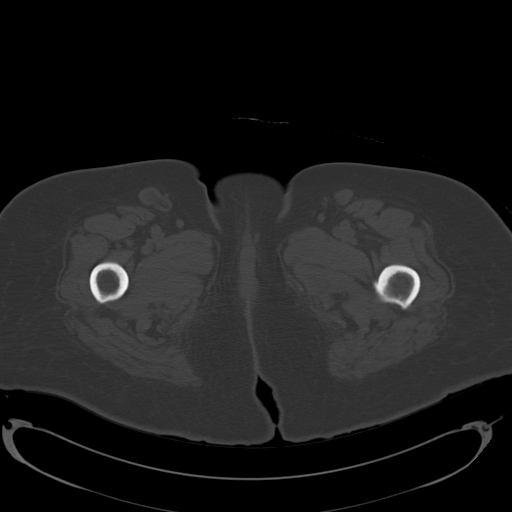
[im 11/87  soft-tissue]
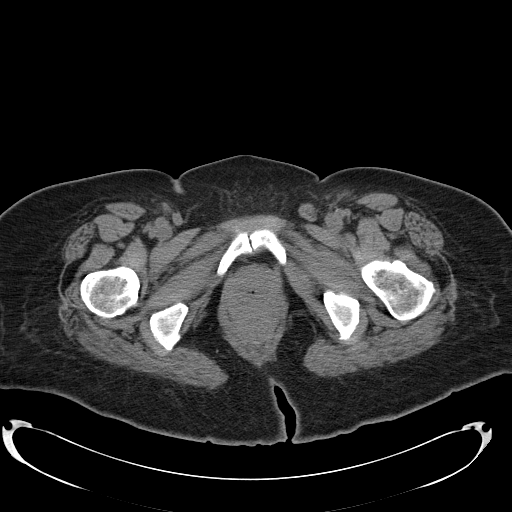
[im 18/87  soft-tissue]
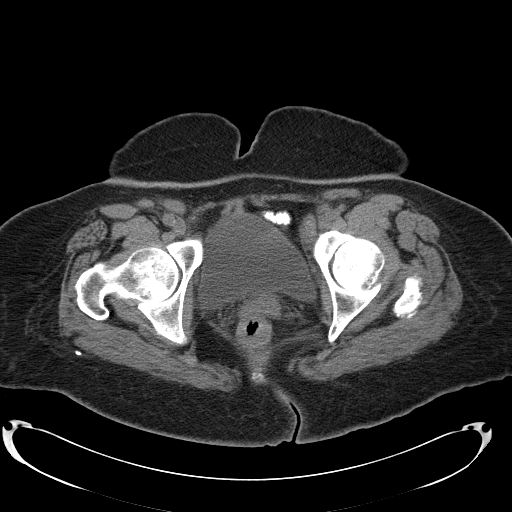
[im 26/87  soft-tissue]
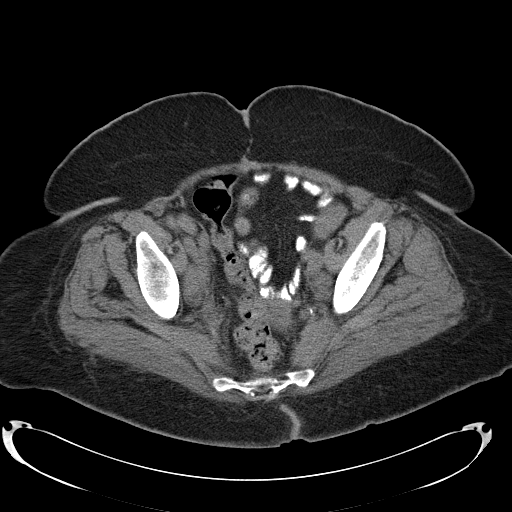
[im 29/87  soft-tissue]
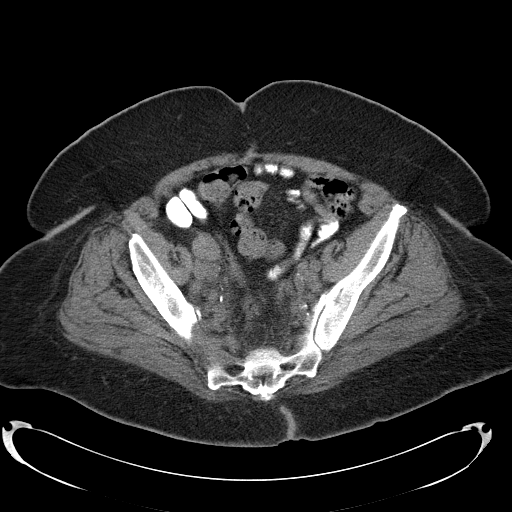
[im 36/87  soft-tissue]
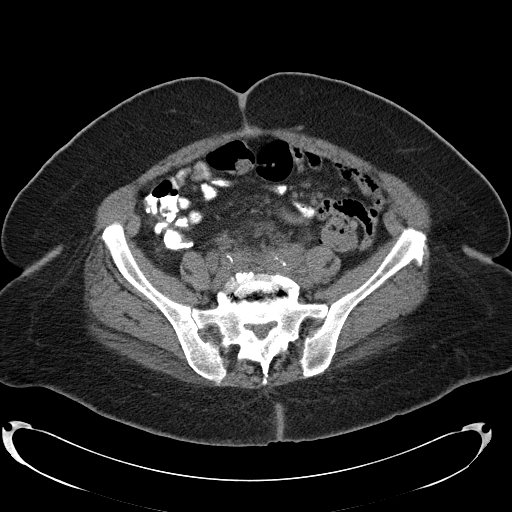
[im 44/87  soft-tissue]
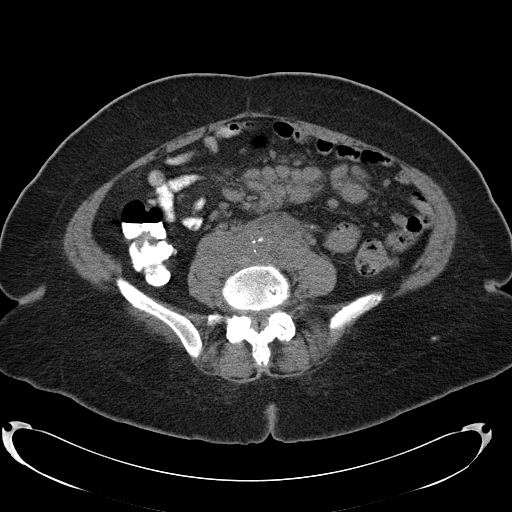
[im 51/87  soft-tissue]
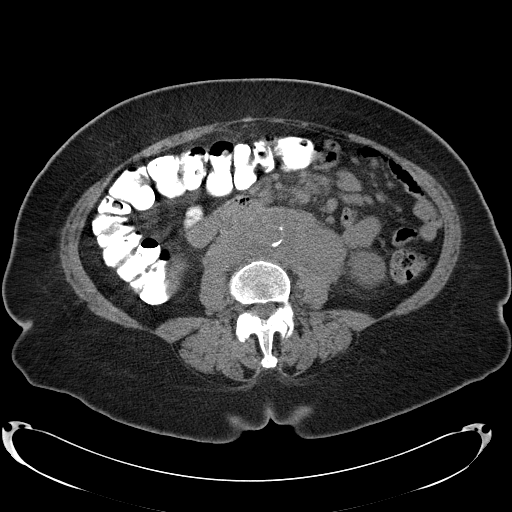
[im 58/87  soft-tissue]
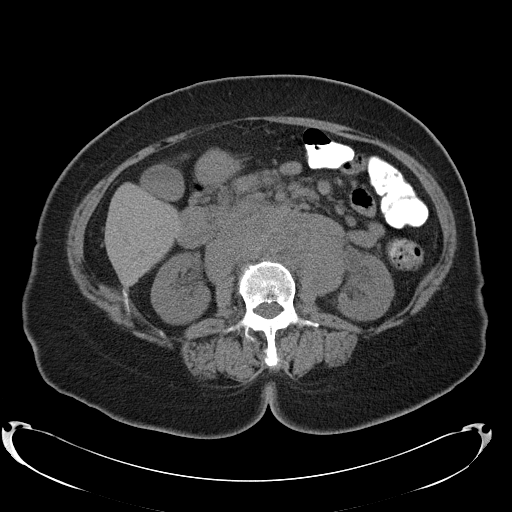
[im 58/87  bone]
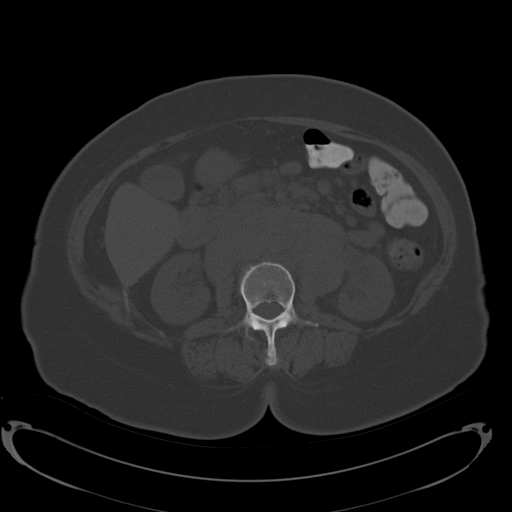
[im 61/87  soft-tissue]
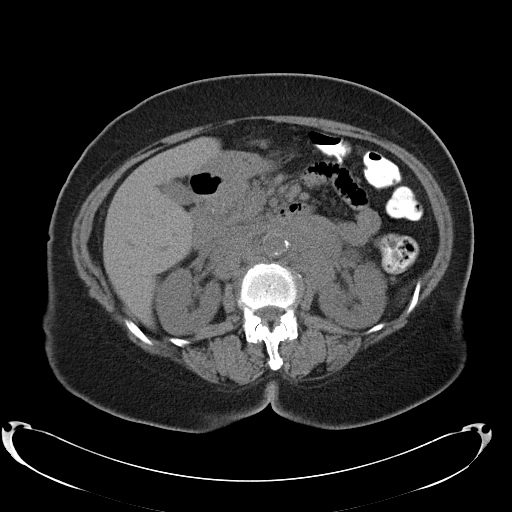
[im 69/87  soft-tissue]
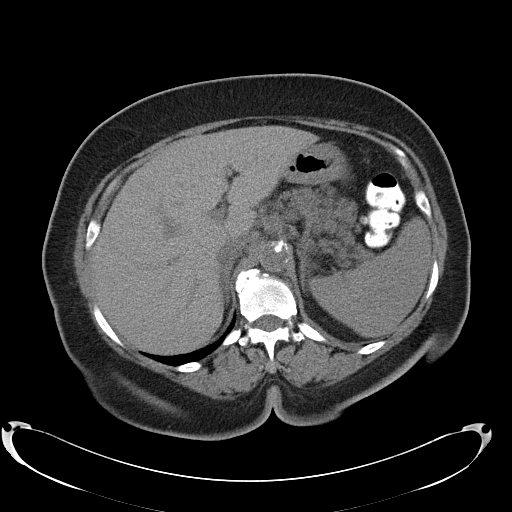
[im 76/87  soft-tissue]
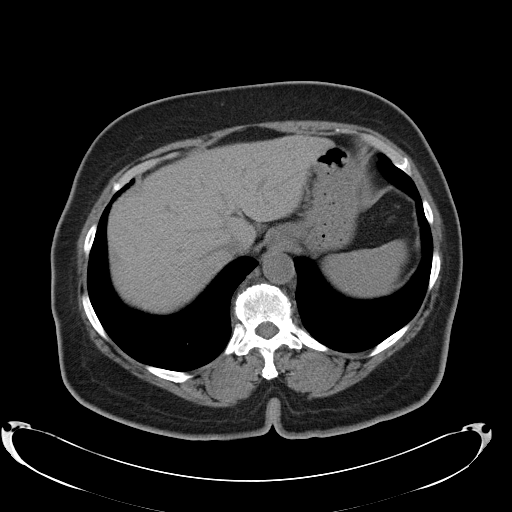
[im 83/87  soft-tissue]
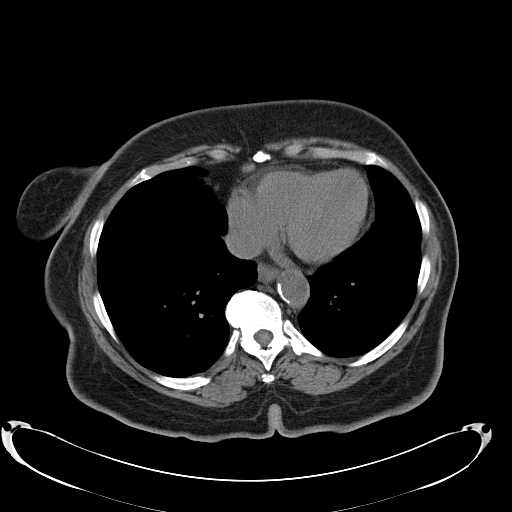

[Series 602: <mpr thick range> · coronal · 0.85mm/px · 3 of 99 slices shown]
[im 33/99  soft-tissue]
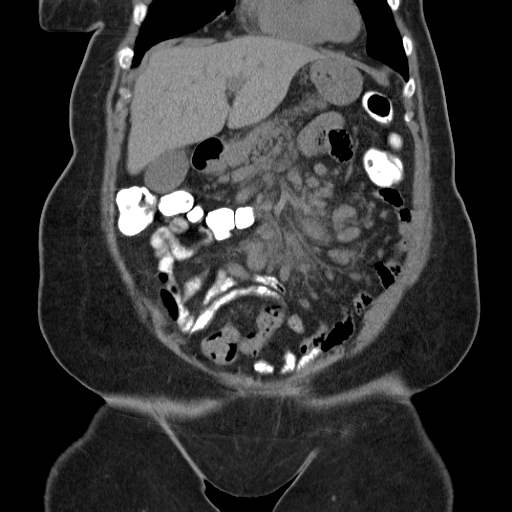
[im 44/99  soft-tissue]
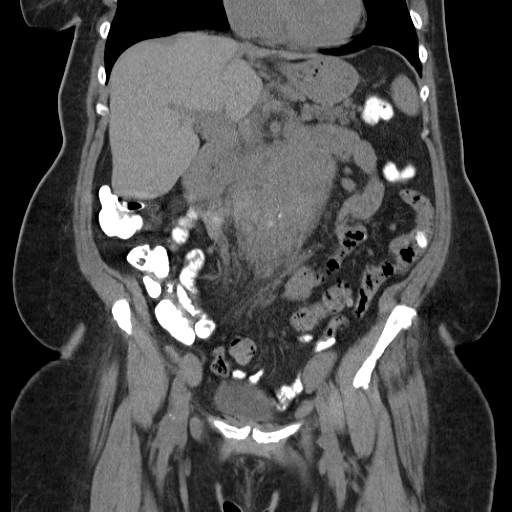
[im 55/99  soft-tissue]
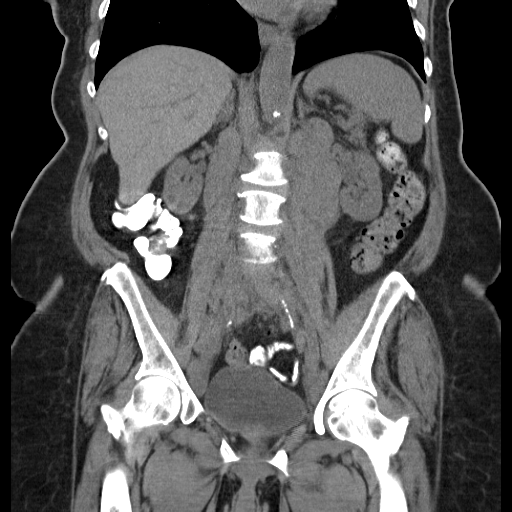

[16 of 46 positions shown; findings below may reference images not displayed]

FINDINGS: Lower chest:  Lung bases are clear.

Hepatobiliary: No focal hepatic lesion on noncontrast exam.

Pancreas: Pancreas is normal. No ductal dilatation. No pancreatic
inflammation.

Spleen: Normal volume spleen.

Adrenals/urinary tract: Adrenal glands are normal. Kidneys in ureter
are normal. Bladder is normal.

Stomach/Bowel: Stomach, small bowel, appendix, cecum normal. The
colon and rectosigmoid colon are normal.

Vascular/Lymphatic: There is bulky periaortic retroperitoneal
lymphadenopathy encasing the aorta. This has advance considerably
from most recent comparison CT of [DATE] /2493. Example node left of
the aorta measures 6.0 x 4.3 cm compared to 1.6 x 0.5 cm on prior.
Bulky adenopathy in the right common iliac nodal station measures
3.3 cm compared to 1.2 cm on prior (image 45, series 2). There are
multiple small mesenteric lymph nodes inferior to the pancreas and
within the central mesentery. The largest nodes within central
mesenteric are more inferiorly on image 44. The no measurable
mesenteric adenopathy is present on comparison exam. Current lymph
nodes measure up to 1.5 cm short axis.

There is iliac adenopathy. Enlarged lymph nodes along the obturator
space measures with 2.2 cm. Left external iliac lymph node measures
12 mm short axis increased from 8 mm on prior. Inguinal lymph node
measures 1.9 cm compared to 0.7 cm on prior.

Reproductive: Post hysterectomy anatomy. Potential nodule in the
peritoneal space

Musculoskeletal: No aggressive osseous lesion.

Other: Soft tissue nodule superior to the vaginal cuff measuring
x 4.0 cm increased from 3.1 x 2.0 cm. Potentially a lymph node or
ovarian tissue.
IMPRESSION: 1. Marked progression of lymphadenopathy in the abdomen and pelvis
consistent with progression of chronic lymphocytic leukemia.
2. Bulky para-aortic retroperitoneal lymphadenopathy encases the
aorta and proximal iliac vessels.
3. Enlarged external iliac and inguinal adenopathy.
4. Central mesenteric adenopathy.
5. Soft tissue nodule / mass superior to the vaginal cuff could
represent lymphoid tissue versus ovarian tissue. Query oophorectomy.

## 2016-03-14 ENCOUNTER — Other Ambulatory Visit: Payer: Self-pay | Admitting: Nurse Practitioner

## 2016-03-17 ENCOUNTER — Other Ambulatory Visit: Payer: Self-pay | Admitting: Nurse Practitioner

## 2016-03-22 IMAGING — CR DG CHEST 1V PORT
1 series · 1 of 1 positions shown · non-contrast
Comparison: 09/04/2014

CLINICAL DATA: Wounds on the buttock area.  Evaluate for SVT.

EXAM:
PORTABLE CHEST - 1 VIEW

[AP]
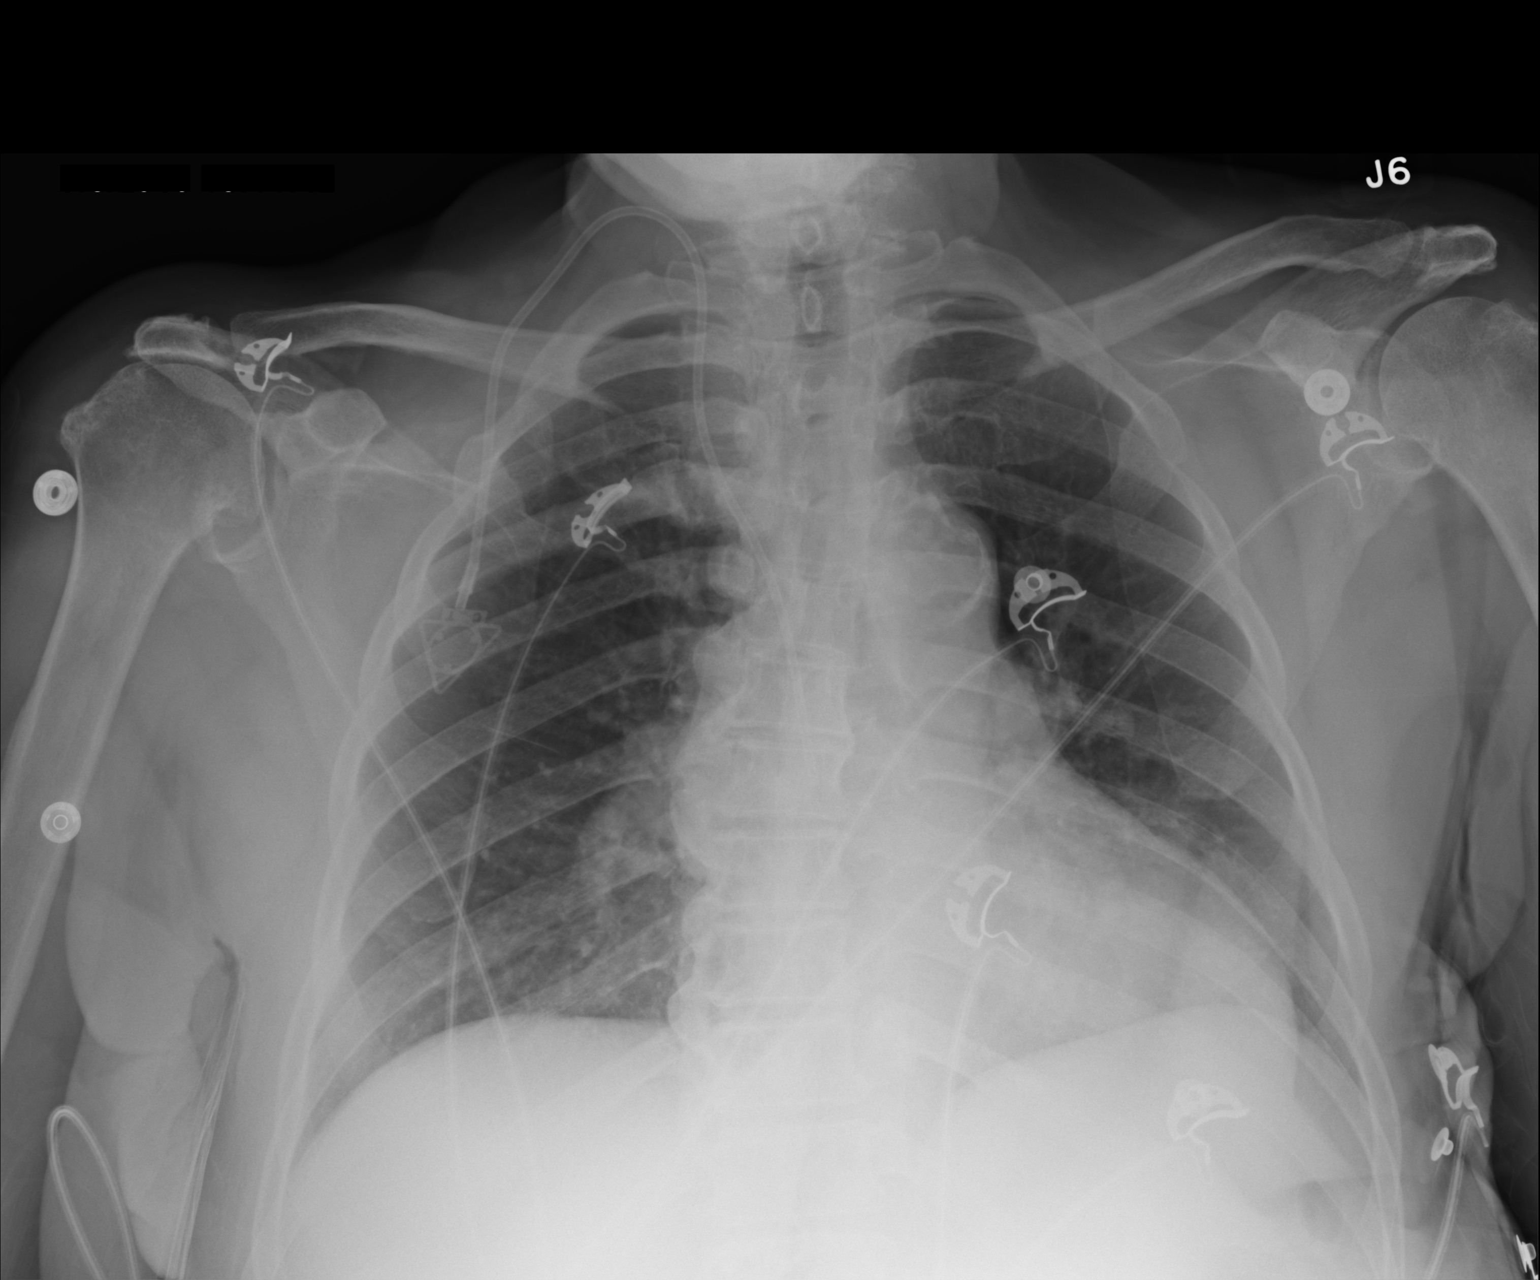

[1 of 1 positions shown; findings below may reference images not displayed]

FINDINGS: Again noted is a right jugular Port-A-Cath. A catheter tip is more
midline than expected but this may be related to patient
positioning. Tip is likely in the lower SVC region. Atherosclerotic
calcifications at the aortic arch. Heart size is within normal
limits. There is a bandlike density overlying the left cardiac apex
which probably represents overlying soft tissues. Otherwise, the
lungs are clear. Degenerative changes in thoracic spine.
IMPRESSION: No acute chest findings.
